# Patient Record
Sex: Male | Born: 1945
Health system: Southern US, Community
[De-identification: ages and names within clinical notes are randomized; demographics above are authoritative.]

## PROBLEM LIST (undated history)

## (undated) ENCOUNTER — Emergency Department (HOSPITAL_COMMUNITY): Payer: Self-pay | Source: Home / Self Care

## (undated) DIAGNOSIS — I739 Peripheral vascular disease, unspecified: Secondary | ICD-10-CM

## (undated) DIAGNOSIS — I714 Abdominal aortic aneurysm, without rupture, unspecified: Secondary | ICD-10-CM

## (undated) DIAGNOSIS — E785 Hyperlipidemia, unspecified: Secondary | ICD-10-CM

## (undated) DIAGNOSIS — J449 Chronic obstructive pulmonary disease, unspecified: Secondary | ICD-10-CM

## (undated) DIAGNOSIS — I2510 Atherosclerotic heart disease of native coronary artery without angina pectoris: Secondary | ICD-10-CM

## (undated) DIAGNOSIS — I10 Essential (primary) hypertension: Secondary | ICD-10-CM

## (undated) HISTORY — DX: Abdominal aortic aneurysm, without rupture, unspecified: I71.40

## (undated) HISTORY — DX: Abdominal aortic aneurysm, without rupture: I71.4

---

## 2004-08-08 ENCOUNTER — Emergency Department (HOSPITAL_COMMUNITY): Admission: EM | Admit: 2004-08-08 | Discharge: 2004-08-08 | Payer: Self-pay | Admitting: Emergency Medicine

## 2013-11-17 ENCOUNTER — Encounter (HOSPITAL_BASED_OUTPATIENT_CLINIC_OR_DEPARTMENT_OTHER): Payer: Self-pay | Admitting: Emergency Medicine

## 2013-11-17 ENCOUNTER — Emergency Department (HOSPITAL_BASED_OUTPATIENT_CLINIC_OR_DEPARTMENT_OTHER)
Admission: EM | Admit: 2013-11-17 | Discharge: 2013-11-17 | Disposition: A | Discharge status: Home / Self Care | Payer: 59 | Attending: Emergency Medicine | Admitting: Emergency Medicine

## 2013-11-17 DIAGNOSIS — R531 Weakness: Secondary | ICD-10-CM

## 2013-11-17 DIAGNOSIS — I10 Essential (primary) hypertension: Secondary | ICD-10-CM | POA: Diagnosis not present

## 2013-11-17 DIAGNOSIS — R5381 Other malaise: Secondary | ICD-10-CM | POA: Insufficient documentation

## 2013-11-17 DIAGNOSIS — R5383 Other fatigue: Secondary | ICD-10-CM | POA: Diagnosis not present

## 2013-11-17 DIAGNOSIS — Z79899 Other long term (current) drug therapy: Secondary | ICD-10-CM | POA: Diagnosis not present

## 2013-11-17 DIAGNOSIS — J4489 Other specified chronic obstructive pulmonary disease: Secondary | ICD-10-CM | POA: Insufficient documentation

## 2013-11-17 DIAGNOSIS — Z87891 Personal history of nicotine dependence: Secondary | ICD-10-CM | POA: Insufficient documentation

## 2013-11-17 DIAGNOSIS — J449 Chronic obstructive pulmonary disease, unspecified: Secondary | ICD-10-CM | POA: Diagnosis not present

## 2013-11-17 DIAGNOSIS — R42 Dizziness and giddiness: Secondary | ICD-10-CM | POA: Insufficient documentation

## 2013-11-17 HISTORY — DX: Chronic obstructive pulmonary disease, unspecified: J44.9

## 2013-11-17 LAB — URINALYSIS, ROUTINE W REFLEX MICROSCOPIC
Bilirubin Urine: NEGATIVE
Glucose, UA: NEGATIVE mg/dL
Hgb urine dipstick: NEGATIVE
KETONES UR: NEGATIVE mg/dL
NITRITE: NEGATIVE
Protein, ur: NEGATIVE mg/dL
SPECIFIC GRAVITY, URINE: 1.008 (ref 1.005–1.030)
UROBILINOGEN UA: 0.2 mg/dL (ref 0.0–1.0)
pH: 7 (ref 5.0–8.0)

## 2013-11-17 LAB — CBC WITH DIFFERENTIAL/PLATELET
BASOS ABS: 0 10*3/uL (ref 0.0–0.1)
Basophils Relative: 0 % (ref 0–1)
EOS ABS: 0 10*3/uL (ref 0.0–0.7)
EOS PCT: 0 % (ref 0–5)
HCT: 41.6 % (ref 39.0–52.0)
Hemoglobin: 14.8 g/dL (ref 13.0–17.0)
Lymphocytes Relative: 13 % (ref 12–46)
Lymphs Abs: 1.1 10*3/uL (ref 0.7–4.0)
MCH: 35.2 pg — AB (ref 26.0–34.0)
MCHC: 35.6 g/dL (ref 30.0–36.0)
MCV: 99 fL (ref 78.0–100.0)
Monocytes Absolute: 0.5 10*3/uL (ref 0.1–1.0)
Monocytes Relative: 6 % (ref 3–12)
Neutro Abs: 7 10*3/uL (ref 1.7–7.7)
Neutrophils Relative %: 81 % — ABNORMAL HIGH (ref 43–77)
PLATELETS: 104 10*3/uL — AB (ref 150–400)
RBC: 4.2 MIL/uL — ABNORMAL LOW (ref 4.22–5.81)
RDW: 13 % (ref 11.5–15.5)
WBC: 8.7 10*3/uL (ref 4.0–10.5)

## 2013-11-17 LAB — BASIC METABOLIC PANEL
ANION GAP: 15 (ref 5–15)
BUN: 13 mg/dL (ref 6–23)
CALCIUM: 9.6 mg/dL (ref 8.4–10.5)
CHLORIDE: 97 meq/L (ref 96–112)
CO2: 26 mEq/L (ref 19–32)
CREATININE: 1 mg/dL (ref 0.50–1.35)
GFR calc non Af Amer: 76 mL/min — ABNORMAL LOW (ref >90)
GFR, EST AFRICAN AMERICAN: 89 mL/min — AB (ref >90)
Glucose, Bld: 120 mg/dL — ABNORMAL HIGH (ref 70–99)
Potassium: 4 mEq/L (ref 3.7–5.3)
Sodium: 138 mEq/L (ref 137–147)

## 2013-11-17 LAB — URINE MICROSCOPIC-ADD ON

## 2013-11-17 LAB — TROPONIN I: Troponin I: <0.3 ng/mL (ref <0.30)

## 2013-11-17 MED ORDER — HYDROCHLOROTHIAZIDE 25 MG PO TABS: 12.5000 mg | ORAL_TABLET | Freq: Every day | ORAL | Status: DC

## 2013-11-17 MED ORDER — SODIUM CHLORIDE 0.9 % IV BOLUS (SEPSIS)
500.0000 mL | Freq: Once | INTRAVENOUS | Status: AC
  Administered 2013-11-17: 500 mL via INTRAVENOUS

## 2013-11-17 NOTE — ED Notes (Signed)
Pt discharged to home NAD.  

## 2013-11-17 NOTE — Discharge Instructions (Signed)
Weakness Weakness is a lack of strength. It may be felt all over the body (generalized) or in one specific part of the body (focal). Some causes of weakness can be serious. You may need further medical evaluation, especially if you are elderly or you have a history of immunosuppression (such as chemotherapy or HIV), kidney disease, heart disease, or diabetes. CAUSES  Weakness can be caused by many different things, including:  Infection.  Physical exhaustion.  Internal bleeding or other blood loss that results in a lack of red blood cells (anemia).  Dehydration. This cause is more common in elderly people.  Side effects or electrolyte abnormalities from medicines, such as pain medicines or sedatives.  Emotional distress, anxiety, or depression.  Circulation problems, especially severe peripheral arterial disease.  Heart disease, such as rapid atrial fibrillation, bradycardia, or heart failure.  Nervous system disorders, such as Guillain-Barr syndrome, multiple sclerosis, or stroke. DIAGNOSIS  To find the cause of your weakness, your caregiver will take your history and perform a physical exam. Lab tests or X-rays may also be ordered, if needed. TREATMENT  Treatment of weakness depends on the cause of your symptoms and can vary greatly. HOME CARE INSTRUCTIONS   Rest as needed.  Eat a well-balanced diet.  Try to get some exercise every day.  Only take over-the-counter or prescription medicines as directed by your caregiver. SEEK MEDICAL CARE IF:   Your weakness seems to be getting worse or spreads to other parts of your body.  You develop new aches or pains. SEEK IMMEDIATE MEDICAL CARE IF:   You cannot perform your normal daily activities, such as getting dressed and feeding yourself.  You cannot walk up and down stairs, or you feel exhausted when you do so.  You have shortness of breath or chest pain.  You have difficulty moving parts of your body.  You have weakness  in only one area of the body or on only one side of the body.  You have a fever.  You have trouble speaking or swallowing.  You cannot control your bladder or bowel movements.  You have black or bloody vomit or stools. MAKE SURE YOU:  Understand these instructions.  Will watch your condition.  Will get help right away if you are not doing well or get worse. Document Released: 04/05/2005 Document Revised: 10/05/2011 Document Reviewed: 06/04/2011 [facility] Patient Information 2015 [employer] [place]. This information is not intended to replace advice given to you by your health care provider. Make sure you discuss any questions you have with your health care provider. Hypertension Hypertension, commonly called high blood pressure, is when the force of blood pumping through your arteries is too strong. Your arteries are the blood vessels that carry blood from your heart throughout your body. A blood pressure reading consists of a higher number over a lower number, such as 110/72. The higher number (systolic) is the pressure inside your arteries when your heart pumps. The lower number (diastolic) is the pressure inside your arteries when your heart relaxes. Ideally you want your blood pressure below 120/80. Hypertension forces your heart to work harder to pump blood. Your arteries may become narrow or stiff. Having hypertension puts you at risk for heart disease, stroke, and other problems.  RISK FACTORS Some risk factors for high blood pressure are controllable. Others are not.  Risk factors you cannot control include:   Race. You may be at higher risk if you are African American.  Age. Risk increases with age.  Gender.  Men are at higher risk than women before age 75 years. After age 70, women are at higher risk than men. Risk factors you can control include:  Not getting enough exercise or physical activity.  Being overweight.  Getting too much fat, sugar, calories, or salt in your  diet.  Drinking too much alcohol. SIGNS AND SYMPTOMS Hypertension does not usually cause signs or symptoms. Extremely high blood pressure (hypertensive crisis) may cause headache, anxiety, shortness of breath, and nosebleed. DIAGNOSIS  To check if you have hypertension, your health care provider will measure your blood pressure while you are seated, with your arm held at the level of your heart. It should be measured at least twice using the same arm. Certain conditions can cause a difference in blood pressure between your right and left arms. A blood pressure reading that is higher than normal on one occasion does not mean that you need treatment. If one blood pressure reading is high, ask your health care provider about having it checked again. TREATMENT  Treating high blood pressure includes making lifestyle changes and possibly taking medicine. Living a healthy lifestyle can help lower high blood pressure. You may need to change some of your habits. Lifestyle changes may include:  Following the DASH diet. This diet is high in fruits, vegetables, and whole grains. It is low in salt, red meat, and added sugars.  Getting at least 2 hours of brisk physical activity every week.  Losing weight if necessary.  Not smoking.  Limiting alcoholic beverages.  Learning ways to reduce stress. If lifestyle changes are not enough to get your blood pressure under control, your health care provider may prescribe medicine. You may need to take more than one. Work closely with your health care provider to understand the risks and benefits. HOME CARE INSTRUCTIONS  Have your blood pressure rechecked as directed by your health care provider.   Take medicines only as directed by your health care provider. Follow the directions carefully. Blood pressure medicines must be taken as prescribed. The medicine does not work as well when you skip doses. Skipping doses also puts you at risk for problems.   Do not  smoke.   Monitor your blood pressure at home as directed by your health care provider. SEEK MEDICAL CARE IF:   You think you are having a reaction to medicines taken.  You have recurrent headaches or feel dizzy.  You have swelling in your ankles.  You have trouble with your vision. SEEK IMMEDIATE MEDICAL CARE IF:  You develop a severe headache or confusion.  You have unusual weakness, numbness, or feel faint.  You have severe chest or abdominal pain.  You vomit repeatedly.  You have trouble breathing. MAKE SURE YOU:   Understand these instructions.  Will watch your condition.  Will get help right away if you are not doing well or get worse. Document Released: 04/05/2005 Document Revised: 08/20/2013 Document Reviewed: 01/26/2013 [facility] Patient Information 2015 [place], [place]. This information is not intended to replace advice given to you by your health care provider. Make sure you discuss any questions you have with your health care provider.

## 2013-11-17 NOTE — ED Provider Notes (Signed)
CSN: [id number]     Arrival date & time 11/17/13  1041 History   First MD Initiated Contact with Patient 11/17/13 1204     Chief Complaint  Patient presents with  . Weakness     (Consider location/radiation/quality/duration/timing/severity/associated sxs/prior Treatment) Patient is a 68 y.o. male presenting with weakness. The history is provided by the patient. No language interpreter was used.  Weakness This is a new problem. Associated symptoms include weakness. Pertinent negatives include no abdominal pain, chest pain, chills, congestion, coughing, fever, headaches, myalgias, nausea, rash or sore throat. Associated symptoms comments: He reports lightheadedness/dizziness and generalized weakness over the last week that is a bit worse today. No falls. No fever, N, V, D, or pain. He denies headaches, visual changes, lateralizing weakness, syncope or near syncope. No urinary symptoms, change in bowel habits, change in appetite..    Past Medical History  Diagnosis Date  . COPD (chronic obstructive pulmonary disease)    History reviewed. No pertinent past surgical history. No family history on file. History  Substance Use Topics  . Smoking status: Former Games developer  . Smokeless tobacco: Not on file  . Alcohol Use: Yes    Review of Systems  Constitutional: Positive for activity change. Negative for fever, chills and appetite change.  HENT: Negative.  Negative for congestion and sore throat.   Eyes: Negative.  Negative for visual disturbance.  Respiratory: Negative.  Negative for cough and shortness of breath.   Cardiovascular: Negative.  Negative for chest pain.  Gastrointestinal: Negative.  Negative for nausea, abdominal pain, diarrhea and constipation.  Genitourinary: Negative.  Negative for dysuria, hematuria and difficulty urinating.  Musculoskeletal: Negative.  Negative for myalgias.  Skin: Negative.  Negative for pallor and rash.  Neurological: Positive for dizziness, weakness and  light-headedness. Negative for syncope, speech difficulty and headaches.  Psychiatric/Behavioral: Negative.  Negative for confusion.      Allergies  Review of patient's allergies indicates no known allergies.  Home Medications   Prior to Admission medications   Medication Sig Start Date End Date Taking? Authorizing Provider  budesonide-formoterol (SYMBICORT) 160-4.5 MCG/ACT inhaler Inhale 2 puffs into the lungs 2 (two) times daily.   Yes Historical Provider, MD   BP 188/88  Pulse 81  Temp(Src) 97.9 F (36.6 C) (Oral)  Resp 28  Ht 6\' 1"  (1.854 m)  Wt 228 lb (103.42 kg)  BMI 30.09 kg/m2  SpO2 97% Physical Exam  Constitutional: He is oriented to person, place, and time. He appears well-developed and well-nourished. No distress.  HENT:  Head: Normocephalic.  Neck: Normal range of motion. Neck supple.  Cardiovascular: Normal rate and regular rhythm.   Pulmonary/Chest: Effort normal and breath sounds normal. He has no wheezes. He has no rales.  Abdominal: Soft. Bowel sounds are normal. There is no tenderness. There is no rebound and no guarding.  Musculoskeletal: Normal range of motion.  Neurological: He is alert and oriented to person, place, and time. Coordination normal.  CN's 3-12 grossly intact. Ambulatory with steady gait, no ataxia.   Skin: Skin is warm and dry. No rash noted.  Psychiatric: He has a normal mood and affect.    ED Course  Procedures (including critical care time) Labs Review Labs Reviewed  BASIC METABOLIC PANEL  URINALYSIS, ROUTINE W REFLEX MICROSCOPIC  TROPONIN I  CBC WITH DIFFERENTIAL   Results for orders placed during the hospital encounter of 11/17/13  BASIC METABOLIC PANEL      Result Value Ref Range   Sodium 138  137 -  147 mEq/L   Potassium 4.0  3.7 - 5.3 mEq/L   Chloride 97  96 - 112 mEq/L   CO2 26  19 - 32 mEq/L   Glucose, Bld 120 (*) 70 - 99 mg/dL   BUN 13  6 - 23 mg/dL   Creatinine, Ser 1.611.00  0.50 - 1.35 mg/dL   Calcium 9.6  8.4 -  09.610.5 mg/dL   GFR calc non Af Amer 76 (*) >90 mL/min   GFR calc Af Amer 89 (*) >90 mL/min   Anion gap 15  5 - 15  URINALYSIS, ROUTINE W REFLEX MICROSCOPIC      Result Value Ref Range   Color, Urine YELLOW  YELLOW   APPearance CLEAR  CLEAR   Specific Gravity, Urine 1.008  1.005 - 1.030   pH 7.0  5.0 - 8.0   Glucose, UA NEGATIVE  NEGATIVE mg/dL   Hgb urine dipstick NEGATIVE  NEGATIVE   Bilirubin Urine NEGATIVE  NEGATIVE   Ketones, ur NEGATIVE  NEGATIVE mg/dL   Protein, ur NEGATIVE  NEGATIVE mg/dL   Urobilinogen, UA 0.2  0.0 - 1.0 mg/dL   Nitrite NEGATIVE  NEGATIVE   Leukocytes, UA TRACE (*) NEGATIVE  TROPONIN I      Result Value Ref Range   Troponin I <0.30  <0.30 ng/mL  CBC WITH DIFFERENTIAL      Result Value Ref Range   WBC 8.7  4.0 - 10.5 K/uL   RBC 4.20 (*) 4.22 - 5.81 MIL/uL   Hemoglobin 14.8  13.0 - 17.0 g/dL   HCT 04.541.6  40.939.0 - 81.152.0 %   MCV 99.0  78.0 - 100.0 fL   MCH 35.2 (*) 26.0 - 34.0 pg   MCHC 35.6  30.0 - 36.0 g/dL   RDW 91.413.0  78.211.5 - 95.615.5 %   Platelets 104 (*) 150 - 400 K/uL   Neutrophils Relative % 81 (*) 43 - 77 %   Neutro Abs 7.0  1.7 - 7.7 K/uL   Lymphocytes Relative 13  12 - 46 %   Lymphs Abs 1.1  0.7 - 4.0 K/uL   Monocytes Relative 6  3 - 12 %   Monocytes Absolute 0.5  0.1 - 1.0 K/uL   Eosinophils Relative 0  0 - 5 %   Eosinophils Absolute 0.0  0.0 - 0.7 K/uL   Basophils Relative 0  0 - 1 %   Basophils Absolute 0.0  0.0 - 0.1 K/uL  URINE MICROSCOPIC-ADD ON      Result Value Ref Range   Squamous Epithelial / LPF RARE  RARE   WBC, UA 0-2  <3 WBC/hpf   Bacteria, UA FEW (*) RARE   No results found.  Imaging Review No results found.   EKG Interpretation None      MDM   Final diagnoses:  None    1. Generalized weakness 2. Hypertension  The patient is ambulatory, has a normal neurologic exam and continues to report improvement over time. He is stable for discharge home.    Arnoldo HookerShari A Pierce Barocio, PA-C 11/17/13 1546

## 2013-11-17 NOTE — ED Notes (Signed)
Patient reports that he developed weakness and fatigue. Denies fever, denies chills. Reports that it all started earlier in week, with bilateral lef aching. Alert and oriented, [facility]

## 2013-11-17 NOTE — ED Provider Notes (Signed)
Medical screening examination/treatment/procedure(s) were conducted as a shared visit with non-physician practitioner(s) and myself.  I personally evaluated the patient during the encounter.   EKG Interpretation   Date/Time:  Saturday November 17 2013 12:42:23 EDT Ventricular Rate:  69 PR Interval:  222 QRS Duration: 68 QT Interval:  404 QTC Calculation: 432 R Axis:   2 Text Interpretation:  Sinus rhythm with 1st degree A-V block Low voltage  QRS Septal infarct , age undetermined Abnormal ECG No old tracing to  compare Confirmed by Drue Harr  MD, Nichola Cieslinski ([id number]) on 11/17/2013 4:08:58 PM      Pt with generalized weakness.  No focal neuro deficits.  No SOB, chest tightness..  Labs unremarkable.  No ischemic changes on EKG.  Will f/u with PMD.  Rolan BuccoMelanie Ibeth Fahmy, MD 11/17/13 [phone]

## 2013-11-23 ENCOUNTER — Emergency Department (HOSPITAL_BASED_OUTPATIENT_CLINIC_OR_DEPARTMENT_OTHER)
Admission: EM | Admit: 2013-11-23 | Discharge: 2013-11-23 | Disposition: A | Discharge status: Home / Self Care | Payer: 59 | Attending: Emergency Medicine | Admitting: Emergency Medicine

## 2013-11-23 ENCOUNTER — Encounter (HOSPITAL_BASED_OUTPATIENT_CLINIC_OR_DEPARTMENT_OTHER): Payer: Self-pay | Admitting: Emergency Medicine

## 2013-11-23 DIAGNOSIS — R5383 Other fatigue: Secondary | ICD-10-CM | POA: Diagnosis present

## 2013-11-23 DIAGNOSIS — Z79899 Other long term (current) drug therapy: Secondary | ICD-10-CM | POA: Diagnosis not present

## 2013-11-23 DIAGNOSIS — R5381 Other malaise: Secondary | ICD-10-CM | POA: Insufficient documentation

## 2013-11-23 DIAGNOSIS — J449 Chronic obstructive pulmonary disease, unspecified: Secondary | ICD-10-CM | POA: Diagnosis not present

## 2013-11-23 DIAGNOSIS — Z880 Allergy status to penicillin: Secondary | ICD-10-CM | POA: Diagnosis not present

## 2013-11-23 DIAGNOSIS — Z87891 Personal history of nicotine dependence: Secondary | ICD-10-CM | POA: Insufficient documentation

## 2013-11-23 DIAGNOSIS — I10 Essential (primary) hypertension: Secondary | ICD-10-CM

## 2013-11-23 DIAGNOSIS — R002 Palpitations: Secondary | ICD-10-CM | POA: Insufficient documentation

## 2013-11-23 DIAGNOSIS — J4489 Other specified chronic obstructive pulmonary disease: Secondary | ICD-10-CM | POA: Insufficient documentation

## 2013-11-23 LAB — CBC
HCT: 43.7 % (ref 39.0–52.0)
Hemoglobin: 15.7 g/dL (ref 13.0–17.0)
MCH: 34.8 pg — AB (ref 26.0–34.0)
MCHC: 35.9 g/dL (ref 30.0–36.0)
MCV: 96.9 fL (ref 78.0–100.0)
PLATELETS: 130 10*3/uL — AB (ref 150–400)
RBC: 4.51 MIL/uL (ref 4.22–5.81)
RDW: 12.5 % (ref 11.5–15.5)
WBC: 10.9 10*3/uL — ABNORMAL HIGH (ref 4.0–10.5)

## 2013-11-23 LAB — BASIC METABOLIC PANEL
ANION GAP: 12 (ref 5–15)
BUN: 10 mg/dL (ref 6–23)
CO2: 30 meq/L (ref 19–32)
CREATININE: 1 mg/dL (ref 0.50–1.35)
Calcium: 10.2 mg/dL (ref 8.4–10.5)
Chloride: 91 mEq/L — ABNORMAL LOW (ref 96–112)
GFR calc Af Amer: 89 mL/min — ABNORMAL LOW (ref >90)
GFR calc non Af Amer: 76 mL/min — ABNORMAL LOW (ref >90)
Glucose, Bld: 120 mg/dL — ABNORMAL HIGH (ref 70–99)
Potassium: 4.1 mEq/L (ref 3.7–5.3)
Sodium: 133 mEq/L — ABNORMAL LOW (ref 137–147)

## 2013-11-23 MED ORDER — LISINOPRIL 10 MG PO TABS
20.0000 mg | ORAL_TABLET | Freq: Once | ORAL | Status: AC
  Administered 2013-11-23: 20 mg via ORAL
  Filled 2013-11-23: qty 2

## 2013-11-23 MED ORDER — LISINOPRIL-HYDROCHLOROTHIAZIDE 20-12.5 MG PO TABS: 1.0000 | ORAL_TABLET | Freq: Every day | ORAL | Status: DC

## 2013-11-23 NOTE — ED Provider Notes (Signed)
CSN: [id number]     Arrival date & time 11/23/13  1432 History   First MD Initiated Contact with Patient 11/23/13 1503     Chief Complaint  Patient presents with  . Hypertension     (Consider location/radiation/quality/duration/timing/severity/associated sxs/prior Treatment) HPI 68 year old man presents with continued hypertension. He was seen here on 8/1 with a vague overall weakness and diagnosed with hypertension and started on 15 mg HCTZ. He's been taking this as instructed, last took it today. Since then his blood pressures been mostly 160 systolic. Today his been feeling intermittent lightheadedness and overall weakness and noticed blood pressure was 215 systolic. He denies headache, blurry vision, chest pain, shortness of breath, or focal weakness. He just feels overall tired. He has been feeling some intermittent palpitations. These are without pain. Patient has a primary care physician who recently moved, is unsure of this practice will continue to follow him or not.  Past Medical History  Diagnosis Date  . COPD (chronic obstructive pulmonary disease)    History reviewed. No pertinent past surgical history. No family history on file. History  Substance Use Topics  . Smoking status: Former Games developer  . Smokeless tobacco: Not on file  . Alcohol Use: Yes    Review of Systems  Constitutional: Negative for fever.  Eyes: Negative for visual disturbance.  Respiratory: Negative for shortness of breath.   Cardiovascular: Positive for palpitations. Negative for chest pain.  Gastrointestinal: Negative for abdominal pain.  Neurological: Positive for weakness and light-headedness. Negative for headaches.  All other systems reviewed and are negative.     Allergies  Penicillins  Home Medications   Prior to Admission medications   Medication Sig Start Date End Date Taking? Authorizing Provider  budesonide-formoterol (SYMBICORT) 160-4.5 MCG/ACT inhaler Inhale 2 puffs into the lungs 2  (two) times daily.    Historical Provider, MD  hydrochlorothiazide (HYDRODIURIL) 25 MG tablet Take 0.5 tablets (12.5 mg total) by mouth daily. 11/17/13   Shari A Upstill, PA-C   BP 197/94  Pulse 91  Temp(Src) 97.9 F (36.6 C) (Oral)  Resp 20  Ht 6' (1.829 m)  Wt 228 lb (103.42 kg)  BMI 30.92 kg/m2  SpO2 100% Physical Exam  Nursing note and vitals reviewed. Constitutional: He is oriented to person, place, and time. He appears well-developed and well-nourished.  HENT:  Head: Normocephalic and atraumatic.  Right Ear: External ear normal.  Left Ear: External ear normal.  Nose: Nose normal.  Eyes: EOM are normal. Pupils are equal, round, and reactive to light. Right eye exhibits no discharge. Left eye exhibits no discharge.  Neck: Neck supple.  Cardiovascular: Normal rate, regular rhythm, normal heart sounds and intact distal pulses.   No murmur heard. Pulmonary/Chest: Effort normal and breath sounds normal.  Abdominal: Soft. There is no tenderness.  Musculoskeletal: He exhibits no edema.  Neurological: He is alert and oriented to person, place, and time.  CN 2-12 grossly intact. 5/5 strength in all 4 extremities. Normal gait  Skin: Skin is warm and dry.    ED Course  Procedures (including critical care time) Labs Review Labs Reviewed  CBC - Abnormal; Notable for the following:    WBC 10.9 (*)    MCH 34.8 (*)    Platelets 130 (*)    All other components within normal limits  BASIC METABOLIC PANEL - Abnormal; Notable for the following:    Sodium 133 (*)    Chloride 91 (*)    Glucose, Bld 120 (*)    GFR  calc non Af Amer 76 (*)    GFR calc Af Amer 89 (*)    All other components within normal limits    Imaging Review No results found.   EKG Interpretation   Date/Time:  Friday November 23 2013 15:35:00 EDT Ventricular Rate:  77 PR Interval:  218 QRS Duration: 92 QT Interval:  410 QTC Calculation: 463 R Axis:   -2 Text Interpretation:  Sinus rhythm with 1st degree A-V  block Low voltage  QRS Borderline ECG no significant change since 11/17/13 Confirmed by  Mujtaba Bollig  MD, Trica Usery ([id number]) on 11/23/2013 3:38:09 PM      MDM   Final diagnoses:  Essential hypertension    Exam is unremarkable, as well as lab workup and EKG. Minimal WBC elevation of uncertain etiology, has no infectious symptoms or pain. Has vague weakness but no localizing weakness or neuro abnormalities. Due to this, feel he does not need IV BP control and will add Lisinopril to his regimen. His BP trended down without meds in ED, but still hypertensive at nearly 160 systolic. Recommend he find a PCP as soon as possible.     Audree CamelScott T Madiline Saffran, MD 11/23/13 [phone]

## 2013-11-23 NOTE — Discharge Instructions (Signed)

## 2013-11-23 NOTE — ED Notes (Signed)
Pt c/o cont'd elevated BP -seen here last week for same-unable to f/u with PCP

## 2017-03-10 ENCOUNTER — Emergency Department (HOSPITAL_COMMUNITY)
Admission: EM | Admit: 2017-03-10 | Discharge: 2017-03-11 | Disposition: A | Discharge status: Home / Self Care | Payer: Self-pay | Attending: Emergency Medicine | Admitting: Emergency Medicine

## 2017-03-10 ENCOUNTER — Encounter (HOSPITAL_COMMUNITY): Payer: Self-pay | Admitting: *Deleted

## 2017-03-10 DIAGNOSIS — J449 Chronic obstructive pulmonary disease, unspecified: Secondary | ICD-10-CM | POA: Insufficient documentation

## 2017-03-10 DIAGNOSIS — R2241 Localized swelling, mass and lump, right lower limb: Secondary | ICD-10-CM | POA: Insufficient documentation

## 2017-03-10 DIAGNOSIS — L03115 Cellulitis of right lower limb: Secondary | ICD-10-CM | POA: Insufficient documentation

## 2017-03-10 DIAGNOSIS — Z87891 Personal history of nicotine dependence: Secondary | ICD-10-CM | POA: Insufficient documentation

## 2017-03-10 MED ORDER — CLINDAMYCIN HCL 150 MG PO CAPS: 450.0000 mg | ORAL_CAPSULE | Freq: Four times a day (QID) | ORAL | 0 refills | Status: DC

## 2017-03-10 MED ORDER — CLINDAMYCIN HCL 150 MG PO CAPS: 450.0000 mg | ORAL_CAPSULE | Freq: Four times a day (QID) | ORAL | 0 refills | Status: AC

## 2017-03-10 NOTE — ED Provider Notes (Signed)
[facility] Provider Note   CSN: [id number] Arrival date & time: 03/10/17  1802     History   Chief Complaint Chief Complaint  Patient presents with  . Leg Pain    HPI Jorge Clements is a 71 y.o. male.  HPI 71 year old male with a history of COPD peripheral artery disease and chronic lymphedema presenting with worsening erythema, drainage and swelling to the right leg.  [place] to difficulty ambulating due to his PAD and lymphedema and came back here to live with his ex-wife.  She noticed some yellow drainage from an abrasion to his right anterior shin and was worried about infection.  Has not had any fevers.  Denies headache, chest pain, shortness of breath, abdominal pain, nausea, vomiting, diarrhea.  Is supposed be taking Lasix for his leg swelling but has not been taking it recently.  No alleviating factors.  No history of DVTs.  Endorses mild aching pain to his right shin.  Past Medical History:  Diagnosis Date  . COPD (chronic obstructive pulmonary disease) (HCC)     There are no active problems to display for this patient.   History reviewed. No pertinent surgical history.     Home Medications    Prior to Admission medications   Medication Sig Start Date End Date Taking? Authorizing Provider  clindamycin (CLEOCIN) 150 MG capsule Take 3 capsules (450 mg total) by mouth every 6 (six) hours for 7 days. 03/10/17 03/17/17  Neysa Arts Italyhad, MD  lisinopril-hydrochlorothiazide (PRINZIDE) 20-12.5 MG per tablet Take 1 tablet by mouth daily. Patient not taking: Reported on 03/10/2017 11/23/13   Pricilla LovelessGoldston, Scott, MD    Family History No family history on file.  Social History Social History   Tobacco Use  . Smoking status: Former Smoker  Substance Use Topics  . Alcohol use: Yes  . Drug use: No     Allergies   Penicillins   Review of Systems Review of Systems  Constitutional: Negative for  chills and fever.  HENT: Negative for ear pain and sore throat.   Eyes: Negative for pain and visual disturbance.  Respiratory: Negative for cough and shortness of breath.   Cardiovascular: Positive for leg swelling. Negative for chest pain and palpitations.  Gastrointestinal: Negative for abdominal pain and vomiting.  Genitourinary: Negative for dysuria and hematuria.  Musculoskeletal: Negative for arthralgias and back pain.  Skin: Positive for wound. Negative for color change and rash.  Neurological: Negative for seizures and syncope.  All other systems reviewed and are negative.    Physical Exam Updated Vital Signs BP (!) 117/51   Pulse 71   Temp 97.8 F (36.6 C) (Oral)   Resp 18   SpO2 100%   Physical Exam  Constitutional: He appears well-developed and well-nourished.  HENT:  Head: Normocephalic and atraumatic.  Eyes: Conjunctivae are normal.  Neck: Neck supple.  Cardiovascular: Normal rate and regular rhythm.  No murmur heard. Pulmonary/Chest: Effort normal and breath sounds normal. No respiratory distress.  Abdominal: Soft. There is no tenderness.  Musculoskeletal: He exhibits edema (3+ pitting edema to RLE and 1+ pitting edema to LLE.).  Neurological: He is alert.  Skin: Skin is warm and dry.  Significant erythema and cobblestoning to the skin of the right lower leg with a 6 cm abrasion to the anterior shin.  Faint DP pulse palpated bilaterally.  No significant calf tenderness bilaterally  Psychiatric: He has a normal mood and affect.  Nursing note and vitals reviewed.    ED Treatments / Results  Labs (all labs ordered are listed, but only abnormal results are displayed) Labs Reviewed - No data to display  EKG  EKG Interpretation None       Radiology No results found.  Procedures Procedures (including critical care time)  Medications Ordered in ED Medications - No data to display   Initial Impression / Assessment and Plan / ED Course  I have  reviewed the triage vital signs and the nursing notes.  Pertinent labs & imaging results that were available during my care of the patient were reviewed by me and considered in my medical decision making (see chart for details).     71 year old male with the above history presenting with swelling, pain, erythema and drainage from an abrasion on his right shin.  Afebrile and hemodynamically stable.  He states that he has had lymphedema for many years and was recently pulled out of a nursing facility to live with his ex-wife.  Exam concerning for cellulitis.  Cannot rule out DVT however the ultrasound staff is not available at this time.  No recent falls or trauma.  Most likely cellulitis.  No signs of sepsis or systemic infection at this time.  Will treat with clindamycin and have the patient return tomorrow for a DVT ultrasound.  Patient amenable with plan.  Return precautions given.   Final Clinical Impressions(s) / ED Diagnoses   Final diagnoses:  Cellulitis of right lower extremity    ED Discharge Orders        Ordered    clindamycin (CLEOCIN) 150 MG capsule  Every 6 hours,   Status:  Discontinued     03/10/17 2059    LE VENOUS     03/10/17 2059    clindamycin (CLEOCIN) 150 MG capsule  Every 6 hours     03/10/17 2100       Clara Smolen Italyhad, MD 03/10/17 2232    Melene PlanFloyd, Dan, DO 03/10/17 2312

## 2017-03-10 NOTE — ED Triage Notes (Signed)
To ED for eval of right leg swelling, redness, and pain. Pt states 3 yrs ago he was dx with 50% blood flow to legs. States over the past 2 yrs he's had stents placed in legs. States he was just released from a rehab [place] 2 [place] ago and [place] [facility] [place]. States he was cramped in the car and has had difficulty walking since [place] home ([place] Right leg bright red with skin tears, swelling, and drainage.

## 2017-03-10 NOTE — Discharge Instructions (Signed)
Please return tomorrow for your DVT US.

## 2017-03-11 ENCOUNTER — Inpatient Hospital Stay (HOSPITAL_COMMUNITY): Admission: RE | Admit: 2017-03-11 | Payer: Self-pay | Source: Ambulatory Visit

## 2017-08-11 ENCOUNTER — Encounter (HOSPITAL_COMMUNITY): Payer: Self-pay | Admitting: Emergency Medicine

## 2017-08-11 ENCOUNTER — Inpatient Hospital Stay (HOSPITAL_COMMUNITY)
Admission: EM | Admit: 2017-08-11 | Discharge: 2017-08-22 | DRG: 240 | Disposition: A | Discharge status: Skilled Nursing Facility | Payer: Medicare Other | Attending: Internal Medicine | Admitting: Internal Medicine

## 2017-08-11 ENCOUNTER — Emergency Department (HOSPITAL_COMMUNITY): Payer: Medicare Other

## 2017-08-11 ENCOUNTER — Other Ambulatory Visit: Payer: Self-pay

## 2017-08-11 ENCOUNTER — Observation Stay (HOSPITAL_COMMUNITY): Payer: Medicare Other

## 2017-08-11 DIAGNOSIS — R531 Weakness: Secondary | ICD-10-CM

## 2017-08-11 DIAGNOSIS — I96 Gangrene, not elsewhere classified: Secondary | ICD-10-CM | POA: Diagnosis present

## 2017-08-11 DIAGNOSIS — Z888 Allergy status to other drugs, medicaments and biological substances status: Secondary | ICD-10-CM

## 2017-08-11 DIAGNOSIS — S90821A Blister (nonthermal), right foot, initial encounter: Secondary | ICD-10-CM | POA: Diagnosis present

## 2017-08-11 DIAGNOSIS — Z95820 Peripheral vascular angioplasty status with implants and grafts: Secondary | ICD-10-CM

## 2017-08-11 DIAGNOSIS — L089 Local infection of the skin and subcutaneous tissue, unspecified: Secondary | ICD-10-CM | POA: Diagnosis present

## 2017-08-11 DIAGNOSIS — J449 Chronic obstructive pulmonary disease, unspecified: Secondary | ICD-10-CM | POA: Diagnosis present

## 2017-08-11 DIAGNOSIS — D696 Thrombocytopenia, unspecified: Secondary | ICD-10-CM | POA: Diagnosis present

## 2017-08-11 DIAGNOSIS — Y838 Other surgical procedures as the cause of abnormal reaction of the patient, or of later complication, without mention of misadventure at the time of the procedure: Secondary | ICD-10-CM | POA: Diagnosis present

## 2017-08-11 DIAGNOSIS — T82856A Stenosis of peripheral vascular stent, initial encounter: Principal | ICD-10-CM | POA: Diagnosis present

## 2017-08-11 DIAGNOSIS — L8990 Pressure ulcer of unspecified site, unspecified stage: Secondary | ICD-10-CM

## 2017-08-11 DIAGNOSIS — W19XXXA Unspecified fall, initial encounter: Secondary | ICD-10-CM | POA: Diagnosis not present

## 2017-08-11 DIAGNOSIS — F17290 Nicotine dependence, other tobacco product, uncomplicated: Secondary | ICD-10-CM | POA: Diagnosis present

## 2017-08-11 DIAGNOSIS — Z89431 Acquired absence of right foot: Secondary | ICD-10-CM

## 2017-08-11 DIAGNOSIS — W010XXA Fall on same level from slipping, tripping and stumbling without subsequent striking against object, initial encounter: Secondary | ICD-10-CM | POA: Diagnosis present

## 2017-08-11 DIAGNOSIS — L03115 Cellulitis of right lower limb: Secondary | ICD-10-CM | POA: Diagnosis not present

## 2017-08-11 DIAGNOSIS — E871 Hypo-osmolality and hyponatremia: Secondary | ICD-10-CM | POA: Diagnosis present

## 2017-08-11 DIAGNOSIS — M86271 Subacute osteomyelitis, right ankle and foot: Secondary | ICD-10-CM | POA: Diagnosis present

## 2017-08-11 DIAGNOSIS — Z66 Do not resuscitate: Secondary | ICD-10-CM | POA: Diagnosis present

## 2017-08-11 DIAGNOSIS — R945 Abnormal results of liver function studies: Secondary | ICD-10-CM | POA: Diagnosis not present

## 2017-08-11 DIAGNOSIS — M7989 Other specified soft tissue disorders: Secondary | ICD-10-CM

## 2017-08-11 DIAGNOSIS — E538 Deficiency of other specified B group vitamins: Secondary | ICD-10-CM | POA: Diagnosis present

## 2017-08-11 DIAGNOSIS — Z79899 Other long term (current) drug therapy: Secondary | ICD-10-CM

## 2017-08-11 DIAGNOSIS — E860 Dehydration: Secondary | ICD-10-CM | POA: Diagnosis present

## 2017-08-11 DIAGNOSIS — I10 Essential (primary) hypertension: Secondary | ICD-10-CM | POA: Diagnosis present

## 2017-08-11 DIAGNOSIS — E876 Hypokalemia: Secondary | ICD-10-CM | POA: Diagnosis not present

## 2017-08-11 DIAGNOSIS — E785 Hyperlipidemia, unspecified: Secondary | ICD-10-CM | POA: Diagnosis present

## 2017-08-11 DIAGNOSIS — M79661 Pain in right lower leg: Secondary | ICD-10-CM

## 2017-08-11 DIAGNOSIS — S90829A Blister (nonthermal), unspecified foot, initial encounter: Secondary | ICD-10-CM

## 2017-08-11 DIAGNOSIS — I998 Other disorder of circulatory system: Secondary | ICD-10-CM | POA: Diagnosis present

## 2017-08-11 DIAGNOSIS — Y92009 Unspecified place in unspecified non-institutional (private) residence as the place of occurrence of the external cause: Secondary | ICD-10-CM | POA: Diagnosis not present

## 2017-08-11 DIAGNOSIS — Z7982 Long term (current) use of aspirin: Secondary | ICD-10-CM

## 2017-08-11 DIAGNOSIS — S41111A Laceration without foreign body of right upper arm, initial encounter: Secondary | ICD-10-CM | POA: Diagnosis present

## 2017-08-11 DIAGNOSIS — D649 Anemia, unspecified: Secondary | ICD-10-CM | POA: Diagnosis not present

## 2017-08-11 LAB — URINALYSIS, ROUTINE W REFLEX MICROSCOPIC
BACTERIA UA: NONE SEEN
Bilirubin Urine: NEGATIVE
Glucose, UA: NEGATIVE mg/dL
KETONES UR: 5 mg/dL — AB
Leukocytes, UA: NEGATIVE
NITRITE: NEGATIVE
Protein, ur: 30 mg/dL — AB
Specific Gravity, Urine: 1.017 (ref 1.005–1.030)
pH: 6 (ref 5.0–8.0)

## 2017-08-11 LAB — I-STAT CG4 LACTIC ACID, ED: LACTIC ACID, VENOUS: 1.46 mmol/L (ref 0.5–1.9)

## 2017-08-11 LAB — BASIC METABOLIC PANEL
Anion gap: 11 (ref 5–15)
BUN: 14 mg/dL (ref 6–20)
CO2: 22 mmol/L (ref 22–32)
Calcium: 8.5 mg/dL — ABNORMAL LOW (ref 8.9–10.3)
Chloride: 99 mmol/L — ABNORMAL LOW (ref 101–111)
Creatinine, Ser: 1.03 mg/dL (ref 0.61–1.24)
GFR calc non Af Amer: >60 mL/min (ref >60)
Glucose, Bld: 130 mg/dL — ABNORMAL HIGH (ref 65–99)
POTASSIUM: 3.4 mmol/L — AB (ref 3.5–5.1)
Sodium: 132 mmol/L — ABNORMAL LOW (ref 135–145)

## 2017-08-11 LAB — MRSA PCR SCREENING: MRSA by PCR: NEGATIVE

## 2017-08-11 LAB — CBC
HEMATOCRIT: 37.6 % — AB (ref 39.0–52.0)
Hemoglobin: 12.8 g/dL — ABNORMAL LOW (ref 13.0–17.0)
MCH: 29.9 pg (ref 26.0–34.0)
MCHC: 34 g/dL (ref 30.0–36.0)
MCV: 87.9 fL (ref 78.0–100.0)
Platelets: 76 10*3/uL — ABNORMAL LOW (ref 150–400)
RBC: 4.28 MIL/uL (ref 4.22–5.81)
RDW: 14.3 % (ref 11.5–15.5)
WBC: 10.6 10*3/uL — ABNORMAL HIGH (ref 4.0–10.5)

## 2017-08-11 LAB — TROPONIN I
TROPONIN I: 0.03 ng/mL — AB (ref <0.03)
TROPONIN I: 0.03 ng/mL — AB (ref <0.03)
Troponin I: 0.03 ng/mL (ref <0.03)
Troponin I: <0.03 ng/mL (ref <0.03)

## 2017-08-11 LAB — VITAMIN B12: Vitamin B-12: 148 pg/mL — ABNORMAL LOW (ref 180–914)

## 2017-08-11 LAB — TSH: TSH: 1.667 u[IU]/mL (ref 0.350–4.500)

## 2017-08-11 MED ORDER — ACETAMINOPHEN 650 MG RE SUPP: 650.0000 mg | Freq: Four times a day (QID) | RECTAL | Status: DC | PRN

## 2017-08-11 MED ORDER — ACETAMINOPHEN 325 MG PO TABS
650.0000 mg | ORAL_TABLET | Freq: Four times a day (QID) | ORAL | Status: DC | PRN
  Administered 2017-08-11 – 2017-08-12 (×2): 650 mg via ORAL
  Filled 2017-08-11 (×2): qty 2

## 2017-08-11 MED ORDER — SODIUM CHLORIDE 0.9 % IV SOLN
INTRAVENOUS | Status: AC
  Administered 2017-08-11 – 2017-08-12 (×2): via INTRAVENOUS

## 2017-08-11 MED ORDER — CLINDAMYCIN PHOSPHATE 600 MG/50ML IV SOLN
600.0000 mg | Freq: Three times a day (TID) | INTRAVENOUS | Status: DC
  Administered 2017-08-11 – 2017-08-19 (×23): 600 mg via INTRAVENOUS
  Filled 2017-08-11 (×25): qty 50

## 2017-08-11 MED ORDER — ALPRAZOLAM 0.25 MG PO TABS
0.2500 mg | ORAL_TABLET | Freq: Two times a day (BID) | ORAL | Status: DC | PRN
  Administered 2017-08-11 – 2017-08-14 (×4): 0.25 mg via ORAL
  Filled 2017-08-11 (×4): qty 1

## 2017-08-11 MED ORDER — SODIUM CHLORIDE 0.9 % IV BOLUS
1000.0000 mL | Freq: Once | INTRAVENOUS | Status: AC
  Administered 2017-08-11: 1000 mL via INTRAVENOUS

## 2017-08-11 MED ORDER — ONDANSETRON HCL 4 MG/2ML IJ SOLN: 4.0000 mg | Freq: Four times a day (QID) | INTRAMUSCULAR | Status: DC | PRN

## 2017-08-11 MED ORDER — ONDANSETRON HCL 4 MG PO TABS: 4.0000 mg | ORAL_TABLET | Freq: Four times a day (QID) | ORAL | Status: DC | PRN

## 2017-08-11 NOTE — Care Management Note (Addendum)
Case Management Note  Patient Details  Name: Jorge Clements MRN: [id number] Date of Birth: 12/07/1945  Subjective/Objective:  Adm with weakness, fall at home,? Cellulitis of Lower legs. From home alone, has RW and cane pta. Ex wife and son live next door. [facility], most recent [place] in [place] "a year ago". Patient does not have a PCP. He reports that he still drives. Unsure of his medications, states he was taking a "Aspirin and a statin". When asked who is prescribing, he states someone at [facility]".               Action/Plan: CM following for needs. Patient aware of OBS status. Reports he has Medicare A and Medicaid. States his Medicare B will be active in July. Will try to arrange PCP f/u for patient prior to DC.  Patient also aware that home health could not be started until he has established care with a PCP.    Expected Discharge Date:    unk              Expected Discharge Plan:     In-House Referral:     Discharge planning Services  CM Consult  Post Acute Care Choice:    Choice offered to:     DME Arranged:    DME Agency:     HH Arranged:    HH Agency:     Status of Service:  In process, will continue to follow  If discussed at Long Length of Stay Meetings, dates discussed:    Additional Comments:  Vale Peraza, Chrystine OilerSharley Diane, RN 08/11/2017, 11:16 AM

## 2017-08-11 NOTE — Plan of Care (Signed)
  Problem: Acute Rehab PT Goals(only PT should resolve) Goal: Pt Will Go Supine/Side To Sit Outcome: Progressing Flowsheets (Taken 08/11/2017 1534) Pt will go Supine/Side to Sit: with min guard assist Goal: Patient Will Transfer Sit To/From Stand Outcome: Progressing Flowsheets (Taken 08/11/2017 1534) Patient will transfer sit to/from stand: with min guard assist Goal: Pt Will Transfer Bed To Chair/Chair To Bed Outcome: Progressing Flowsheets (Taken 08/11/2017 1534) Pt will Transfer Bed to Chair/Chair to Bed: min guard assist Goal: Pt Will Ambulate Outcome: Progressing Flowsheets (Taken 08/11/2017 1534) Pt will Ambulate: with minimal assist;25 feet;with rolling walker  3:35 PM, 08/11/17 Ocie BobJames Kert Shackett, MPT Physical Therapist with [facility] 336 [phone] office [phone] mobile phone

## 2017-08-11 NOTE — ED Notes (Signed)
Right foot noted to be edematous with erythema. Large amount of dried yellow skin on foot

## 2017-08-11 NOTE — Progress Notes (Addendum)
Spoke with ex wife on the phone who states she does not want pt to go back home and that she will not accept him back if he cannot take care of himself. States that for the past 3 days she has had to wait on his hand and foot. States pt's bank account was compromised and that is when he started becoming ill, and had a nervous break down. Ex wife and son are requesting to have pt sent either to a nursing home or behavioral mental institute. Pt's son states he believes his father has Munchausen syndrome. Ex wife informed that pt is A & O and able to make his own decisions and it would ultimately be his decision. Ex wife expresses understanding and states she doesn't want him back home.  Case management informed and social work consulted

## 2017-08-11 NOTE — Evaluation (Signed)
Physical Therapy Evaluation Patient Details Name: Jorge Clements MRN: [id number] DOB: 07/09/1945 Today's Date: 08/11/2017   History of Present Illness  Jorge Clements  is a 72 y.o. male, with history of COPD, hypertension and peripheral vascular disease status post stenting of both legs presented with weakness that is ongoing for quite some time (in patient's words) but worse in the past 2-3 days.  He reports that he did not have enough strength to get up and ambulate.  On the evening of admission he fell on his right side and feels that he tripped on something and was very weak.  Denies hitting his head but did sustain some laceration in his right arm.  He also reports pain and swelling of his right foot and calf which has been ongoing for " quite some time" but more painful in the past 2 days.    Clinical Impression  Patient demonstrates slow labored movement for rolling in bed, sitting up and taking steps to transfer to chair.  Patient limited to a few steps mostly due to c/o pain bilateral feet that is  worsened when standing and poor standing balance.  Patient will benefit from continued physical therapy in hospital and recommended venue below to increase strength, balance, endurance for safe ADLs and gait.    Follow Up Recommendations SNF;Supervision/Assistance - 24 hour    Equipment Recommendations  None recommended by PT    Recommendations for Other Services       Precautions / Restrictions Precautions Precautions: Fall Restrictions Weight Bearing Restrictions: No      Mobility  Bed Mobility Overal bed mobility: Needs Assistance Bed Mobility: Supine to Sit     Supine to sit: Mod assist        Transfers Overall transfer level: Needs assistance Equipment used: Rolling walker (2 wheeled) Transfers: Sit to/from [employer];Stand Pivot Transfers Sit to Stand: Min assist Stand pivot transfers: Mod assist       General transfer comment: demonstrates slow labored  movement  Ambulation/Gait Ambulation/Gait assistance: Mod assist Ambulation Distance (Feet): 5 Feet Assistive device: Rolling walker (2 wheeled) Gait Pattern/deviations: Decreased step length - right;Decreased step length - left;Decreased stride length Gait velocity: decreased   General Gait Details: limited to 6-7 short unsteady steps at bedside due to poor standing balance bilateral feet pain right worse than left and fatigue  Stairs            Wheelchair Mobility    Modified Rankin (Stroke Patients Only)       Balance Overall balance assessment: Needs assistance Sitting-balance support: Feet supported;No upper extremity supported Sitting balance-Leahy Scale: Fair Sitting balance - Comments: fair/good   Standing balance support: Bilateral upper extremity supported;During functional activity Standing balance-Leahy Scale: Fair                               Pertinent Vitals/Pain Pain Assessment: 0-10 Pain Score: 8  Pain Location: chronic low back pain, BLE, bilateral feet  Pain Descriptors / Indicators: Aching;Discomfort Pain Intervention(s): Limited activity within patient's tolerance;Monitored during session    Home Living Family/patient expects to be discharged to:: Private residence Living Arrangements: Alone;Other (Comment)(lives in a guest on the same lot with his ex-wife and son) Available Help at Discharge: Family Type of Home: House Home Access: Stairs to enter Entrance Stairs-Rails: None Entrance Stairs-Number of Steps: 2 Home Layout: One level Home Equipment: Cane - single point;Walker - 2 wheels      Prior Function  Level of Independence: Independent with assistive device(s)         Comments: Household ambulation mostly without an AD, sometimes using SPC     Hand Dominance        Extremity/Trunk Assessment   Upper Extremity Assessment Upper Extremity Assessment: Generalized weakness    Lower Extremity Assessment Lower  Extremity Assessment: Generalized weakness    Cervical / Trunk Assessment Cervical / Trunk Assessment: Normal  Communication   Communication: No difficulties  Cognition Arousal/Alertness: Awake/alert Behavior During Therapy: WFL for tasks assessed/performed Overall Cognitive Status: Within Functional Limits for tasks assessed                                        General Comments      Exercises     Assessment/Plan    PT Assessment Patient needs continued PT services  PT Problem List Decreased strength;Decreased activity tolerance;Decreased balance;Decreased mobility       PT Treatment Interventions Gait training;Stair training;Functional mobility training;Therapeutic activities;Therapeutic exercise;Patient/family education    PT Goals (Current goals can be found in the Care Plan section)  Acute Rehab PT Goals Patient Stated Goal: return to his guest house  PT Goal Formulation: With patient Time For Goal Achievement: 08/18/17 Potential to Achieve Goals: Good    Frequency Min 4X/week   Barriers to discharge        Co-evaluation               AM-PAC PT "6 Clicks" Daily Activity  Outcome Measure Difficulty turning over in bed (including adjusting bedclothes, sheets and blankets)?: A Lot Difficulty moving from lying on back to sitting on the side of the bed? : A Lot Difficulty sitting down on and standing up from a chair with arms (e.g., wheelchair, bedside commode, etc,.)?: A Lot Help needed moving to and from a bed to chair (including a wheelchair)?: A Lot Help needed walking in hospital room?: A Lot Help needed climbing 3-5 steps with a railing? : Total 6 Click Score: 11    End of Session   Activity Tolerance: Patient tolerated treatment well;Patient limited by fatigue Patient left: in chair;with call bell/phone within reach Nurse Communication: Mobility status;Other (comment)(RN/NT notified patient left in chair) PT Visit Diagnosis:  Unsteadiness on feet (R26.81);Other abnormalities of gait and mobility (R26.89);Muscle weakness (generalized) (M62.81)    Time: 5409-8119 PT Time Calculation (min) (ACUTE ONLY): 39 min   Charges:   PT Evaluation $PT Eval Moderate Complexity: 1 Mod PT Treatments $Therapeutic Activity: 38-52 mins   PT G Codes:        3:33 PM, 09-06-2017 Ocie Bob, MPT Physical Therapist with [facility] 336 [phone] office [phone] mobile phone

## 2017-08-11 NOTE — ED Notes (Addendum)
CRITICAL VALUE ALERT  Critical Value:  Troponin 0.03   Date & Time Notied:  08/11/17 & 0413 hrs  Provider Notified: Dr. Judd Lienelo  Orders Received/Actions taken: N/A

## 2017-08-11 NOTE — ED Provider Notes (Signed)
[facility] EMERGENCY DEPARTMENT Provider Note   CSN: [id number] Arrival date & time: 08/11/17  0122     History   Chief Complaint Chief Complaint  Patient presents with  . Fever    HPI Sallyanne KusterMichael Bulkley is a 72 y.o. male.  This patient is a 72 year old male with past medical history of COPD and peripheral vascular disease status post stents to both legs.  He presents today for evaluation of weakness.  This is been ongoing for "quite some time", however worse over the past 2 days.  This evening he fell because he did not have the strength to ambulate.  He was brought here by EMS for evaluation.  He was found to have a temperature of 102 by EMS, then brought here for evaluation patient denies any specific complaints such as chest pain, difficulty breathing, abdominal pain, vomiting, sore throat, or other symptoms that would explain his fever.  His main complaint is weakness.  The history is provided by the patient.  Fever   This is a new problem. The current episode started 2 days ago. The problem occurs constantly. The maximum temperature noted was 102 to 102.9 F. The temperature was taken using an oral thermometer. Pertinent negatives include no chest pain, no diarrhea, no vomiting and no sore throat. He has tried nothing for the symptoms.    Past Medical History:  Diagnosis Date  . COPD (chronic obstructive pulmonary disease) (HCC)     There are no active problems to display for this patient.   History reviewed. No pertinent surgical history.      Home Medications    Prior to Admission medications   Medication Sig Start Date End Date Taking? Authorizing Provider  lisinopril-hydrochlorothiazide (PRINZIDE) 20-12.5 MG per tablet Take 1 tablet by mouth daily. Patient not taking: Reported on 03/10/2017 11/23/13   Pricilla LovelessGoldston, Scott, MD  hydrochlorothiazide (HYDRODIURIL) 25 MG tablet Take 0.5 tablets (12.5 mg total) by mouth daily. 11/17/13 11/23/13  Elpidio AnisUpstill, Shari, PA-C    Family  History History reviewed. No pertinent family history.  Social History Social History   Tobacco Use  . Smoking status: Former Games developermoker  . Smokeless tobacco: Never Used  Substance Use Topics  . Alcohol use: Not Currently  . Drug use: No     Allergies   Penicillins   Review of Systems Review of Systems  Constitutional: Positive for fever.  HENT: Negative for sore throat.   Cardiovascular: Negative for chest pain.  Gastrointestinal: Negative for diarrhea and vomiting.  All other systems reviewed and are negative.    Physical Exam Updated Vital Signs BP 138/61   Pulse 87   Temp 99.6 F (37.6 C)   Resp (!) 26   Ht 6' (1.829 m)   Wt 86.2 kg (190 lb)   SpO2 100%   BMI 25.77 kg/m   Physical Exam  Constitutional: He is oriented to person, place, and time. He appears well-developed and well-nourished. No distress.  HENT:  Head: Normocephalic and atraumatic.  Mouth/Throat: Oropharynx is clear and moist.  Neck: Normal range of motion. Neck supple.  Cardiovascular: Normal rate and regular rhythm. Exam reveals no friction rub.  No murmur heard. Pulmonary/Chest: Effort normal and breath sounds normal. No respiratory distress. He has no wheezes. He has no rales.  Abdominal: Soft. Bowel sounds are normal. He exhibits no distension. There is no tenderness.  Musculoskeletal: Normal range of motion. He exhibits no edema.  The right lower extremity shows chronic findings of venous stasis.  Lymphadenopathy:  He has no cervical adenopathy.  Neurological: He is alert and oriented to person, place, and time. Coordination normal.  Skin: Skin is warm and dry. He is not diaphoretic.  Nursing note and vitals reviewed.    ED Treatments / Results  Labs (all labs ordered are listed, but only abnormal results are displayed) Labs Reviewed  BASIC METABOLIC PANEL - Abnormal; Notable for the following components:      Result Value   Sodium 132 (*)    Potassium 3.4 (*)    Chloride 99  (*)    Glucose, Bld 130 (*)    Calcium 8.5 (*)    All other components within normal limits  CBC - Abnormal; Notable for the following components:   WBC 10.6 (*)    Hemoglobin 12.8 (*)    HCT 37.6 (*)    Platelets 76 (*)    All other components within normal limits  URINALYSIS, ROUTINE W REFLEX MICROSCOPIC  TROPONIN I  I-STAT CG4 LACTIC ACID, ED    EKG None  Radiology No results found.  Procedures Procedures (including critical care time)  Medications Ordered in ED Medications  sodium chloride 0.9 % bolus 1,000 mL (has no administration in time range)     Initial Impression / Assessment and Plan / ED Course  I have reviewed the triage vital signs and the nursing notes.  Pertinent labs & imaging results that were available during my care of the patient were reviewed by me and considered in my medical decision making (see chart for details).  Patient is a 72 year old male brought by EMS for evaluation of severe weakness.  He was found by EMS to be febrile, however was afebrile upon presentation here.    His work-up reveals a mild leukocytosis, but is otherwise essentially unremarkable.  He does have some swelling, redness, and warmth to the right foot and lower leg consistent with cellulitis.  Urinalysis is pending.  I have spoken with Dr. Sherryll Burger from the hospitalist service who agrees to have the patient admitted.  Final Clinical Impressions(s) / ED Diagnoses   Final diagnoses:  None    ED Discharge Orders    None       Geoffery Lyons, MD 08/11/17 [phone]

## 2017-08-11 NOTE — Care Management Obs Status (Signed)
MEDICARE OBSERVATION STATUS NOTIFICATION   Patient Details  Name: Jorge Clements MRN: [id number] Date of Birth: 05/07/1945   Medicare Observation Status Notification Given:  Yes    Nikodem Leadbetter, Chrystine OilerSharley Diane, RN 08/11/2017, 11:16 AM

## 2017-08-11 NOTE — ED Notes (Signed)
Patient transported to CT 

## 2017-08-11 NOTE — ED Triage Notes (Signed)
Pt c/o generalized weakness, fever, and chills x 3 days. Pt fell using walker in bathroom, he has skin tear to the left elbow and c/o back pain. Pt states he has chronic back pain.

## 2017-08-11 NOTE — H&P (Addendum)
TRH H&P   Patient Demographics:    Jorge Clements, is a 72 y.o. male  MRN: [id number]   DOB - 07/10/1945  Admit Date - 08/11/2017  Outpatient Primary MD for the patient is Patient, No Pcp Per  Referring MD: Dr Judd Lien  Outpatient Specialists: None  Patient coming from: Home  Chief Complaint  Patient presents with  . Fever      HPI:    Jorge Clements  is a 72 y.o. male, with history of COPD, hypertension and peripheral vascular disease status post stenting of both legs presented with weakness that is ongoing for quite some time (in patient's words) but worse in the past 2-3 days.  He reports that he did not have enough strength to get up and ambulate.  On the evening of admission he fell on his right side and feels that he tripped on something and was very weak.  Denies hitting his head but did sustain some laceration in his right arm.  He also reports pain and swelling of his right foot and calf which has been ongoing for " quite some time" but more painful in the past 2 days. Patient denies any headache, blurred vision, dizziness, nausea, vomiting, chills, chest pain, shortness of breath, palpitations, sore throat, body aches, abdominal pain, dysuria, diarrhea, tingling or numbness of the extremity.  Denies any recent travel or sick contact. EMS found him to have a fever of 102 F.  Patient reports he has not seen PCP in a long time.  In the ED he had low-grade temperature of 99.6 F, remaining vitals were stable.  Blood work showed WBC of 10.6, hemoglobin of 12.8, platelets of 76, sodium of 132, K of 3.4, chloride of 99 and mildly elevated troponin of 0.03.  EKG showed normal sinus rhythm with no ST-T changes.  Lactic acid was normal. Chest x-ray was unremarkable and UA negative for infection Hospitalist consulted for observation with findings of possible cellulitis of the right  lower leg given warmth and swelling with tenderness.    Review of systems:    In addition to the HPI above,  Fever ++, no chills No Headache, No changes with Vision or hearing, No problems swallowing food or Liquids, No Chest pain, Cough or Shortness of Breath, No Abdominal pain, No Nausea or vomiting, Bowel movements are regular, No Blood in stool or Urine, No dysuria, No new skin rashes or bruises, No new joints pains-aches,  Right-sided weakness, pain and swelling of the right leg, tingling, numbness in any extremity, No recent weight gain or loss, No polyuria, polydypsia or polyphagia, No significant Mental Stressors.    With Past History of the following :    Past Medical History:  Diagnosis Date  . COPD (chronic obstructive pulmonary disease) (HCC)       Past surgical history None reported by patient   Social History:  Social History   Tobacco Use  . Smoking status: Former Games developermoker  . Smokeless tobacco: Never Used  Substance Use Topics  . Alcohol use: Not Currently     Lives -alone with his ex-wife living next door  Mobility -reports using a cane     Family History :   No significant family history of heart disease, stroke or diabetes   Home Medications:   Prior to Admission medications   Medication Sig Start Date End Date Taking? Authorizing Provider  lisinopril-hydrochlorothiazide (PRINZIDE) 20-12.5 MG per tablet Take 1 tablet by mouth daily. Patient not taking: Reported on 03/10/2017 11/23/13   Pricilla LovelessGoldston, Scott, MD  hydrochlorothiazide (HYDRODIURIL) 25 MG tablet Take 0.5 tablets (12.5 mg total) by mouth daily. 11/17/13 11/23/13  Elpidio AnisUpstill, Shari, PA-C     Allergies:     Allergies  Allergen Reactions  . Penicillins Hives    Childhood reaction Has patient had a PCN reaction causing immediate rash, facial/tongue/throat swelling, SOB or lightheadedness with hypotension: No Has patient had a PCN reaction causing severe rash involving mucus membranes  or skin necrosis: Yes Has patient had a PCN reaction that required hospitalization: Unknown Has patient had a PCN reaction occurring within the last 10 years: No If all of the above answers are "NO", then may proceed with Cephalosporin use.     Physical Exam:   Vitals  Blood pressure 115/68, pulse 85, temperature 98.3 F (36.8 C), temperature source Oral, resp. rate 19, height 6' (1.829 m), weight 86.2 kg (190 lb), SpO2 98 %.    General: Elderly male appears disheveled lying in bed, not in distress HEENT: Pupils reactive bilaterally, EOMI, no pallor, no icterus, moist oral mucosa, mild temporal wasting, supple neck Chest: Clear to auscultation bilaterally, no added sound CVS: Normal S1 and S2, no murmurs rubs or gallop GI: Soft, nondistended, nontender, bowel sounds present Musculoskeletal: Chronic venous stasis changes over right foot with mild swelling of the right calf area with some warmth and tenderness to pressure.  Skin splits over right forearm and elbow with limited mobility due to pain. CNS: Alert and oriented, limited mobility of the right leg due to pain, normal strength and tone in all other extremities.      Data Review:    CBC Recent Labs  Lab 08/11/17 0221  WBC 10.6*  HGB 12.8*  HCT 37.6*  PLT 76*  MCV 87.9  MCH 29.9  MCHC 34.0  RDW 14.3   ------------------------------------------------------------------------------------------------------------------  Chemistries  Recent Labs  Lab 08/11/17 0221  NA 132*  K 3.4*  CL 99*  CO2 22  GLUCOSE 130*  BUN 14  CREATININE 1.03  CALCIUM 8.5*   ------------------------------------------------------------------------------------------------------------------ estimated creatinine clearance is 74.3 mL/min (by C-G formula based on SCr of 1.03 mg/dL). ------------------------------------------------------------------------------------------------------------------ No results for input(s): TSH, T4TOTAL, T3FREE,  THYROIDAB in the last 72 hours.  Invalid input(s): FREET3  Coagulation profile No results for input(s): INR, PROTIME in the last 168 hours. ------------------------------------------------------------------------------------------------------------------- No results for input(s): DDIMER in the last 72 hours. -------------------------------------------------------------------------------------------------------------------  Cardiac Enzymes Recent Labs  Lab 08/11/17 0221  TROPONINI 0.03*   ------------------------------------------------------------------------------------------------------------------ No results found for: BNP   ---------------------------------------------------------------------------------------------------------------  Urinalysis    Component Value Date/Time   COLORURINE YELLOW 08/11/2017 0642   APPEARANCEUR CLEAR 08/11/2017 0642   LABSPEC 1.017 08/11/2017 0642   PHURINE 6.0 08/11/2017 0642   GLUCOSEU NEGATIVE 08/11/2017 0642   HGBUR MODERATE (A) 08/11/2017 0642   BILIRUBINUR NEGATIVE 08/11/2017 0642   KETONESUR 5 (A) 08/11/2017 [id number]  PROTEINUR 30 (A) 08/11/2017 0642   UROBILINOGEN 0.2 11/17/2013 1301   NITRITE NEGATIVE 08/11/2017 0642   LEUKOCYTESUR NEGATIVE 08/11/2017 0642    ----------------------------------------------------------------------------------------------------------------   Imaging Results:    Dg Chest 2 View  Result Date: 08/11/2017 CLINICAL DATA:  Fever and weakness, chills for 3 days. History of COPD. EXAM: CHEST - 2 VIEW COMPARISON:  None. FINDINGS: Cardiomediastinal silhouette is normal. Calcified aortic arch. No pleural effusions or focal consolidations. Mild hyperinflation. Trachea projects midline and there is no pneumothorax. Soft tissue planes and included osseous structures are non-suspicious. Mild degenerative change of thoracic spine. IMPRESSION: Mild hyperinflation without focal consolidation. Aortic Atherosclerosis  (ICD10-I70.0). Electronically Signed   By: Awilda Metro M.D.   On: 08/11/2017 04:00    My personal review of EKG:   Assessment & Plan:    Principal Problem: Generalized weakness with fall Etiology unclear.  Head CT negative.  Possibly due to infection and poor p.o. intake. Placed on observation.  IV hydration with normal saline. Check orthostasis, TSH and B12.  PT evaluation.  Active Problems: Cellulitis of right leg Possibly contributing to his fever.  Placed on empiric clindamycin.  Check Doppler lower extremity to rule out DVT.    COPD (chronic obstructive pulmonary disease) (HCC) He reports he has quit smoking and currently does vaping.  Counseled on cessation.  Currently asymptomatic.  He is not on any medications at home.  Elevated troponin Mild ST changes noted on inferior leads.  Patient denies any chest pain symptoms.  We will cycle enzymes and monitor.  Hypokalemia Replenished  Hyponatremia Possibly due to dehydration.  Monitor with fluids.  Hold HCTZ-ARB.  Peripheral vascular disease with?  History of stenting Not on any medications.  Addendum: Doppler lower extremity negative for DVT  Thrombocytopenia Unexplained.  Check peripheral smear.   DVT Prophylaxis SCDs  AM Labs Ordered, also please review Full Orders  Family Communication: Admission, patients condition and plan of care including tests being ordered have been discussed with the patient   Code Status DNR (as wished by patient)  Likely DC to home tomorrow if improved  Condition: Fair  Consults called: None  Admission status: Observation  Time spent in minutes : 45   Wadsworth Skolnick M.D on 08/11/2017 at 8:20 AM  Between 7am to 7pm - Pager - [phone]. After 7pm go to [email or web address] - password [facility]  [facility] - Office  [phone]

## 2017-08-11 NOTE — Progress Notes (Signed)
Pt requesting to be DNR. Patient educated and understands that we will not do CPR or intubation. Pt verbalizes agreement.

## 2017-08-12 DIAGNOSIS — R531 Weakness: Secondary | ICD-10-CM | POA: Diagnosis not present

## 2017-08-12 DIAGNOSIS — L03115 Cellulitis of right lower limb: Secondary | ICD-10-CM | POA: Diagnosis not present

## 2017-08-12 DIAGNOSIS — L8990 Pressure ulcer of unspecified site, unspecified stage: Secondary | ICD-10-CM

## 2017-08-12 LAB — BASIC METABOLIC PANEL
ANION GAP: 14 (ref 5–15)
BUN: 15 mg/dL (ref 6–20)
CALCIUM: 8.3 mg/dL — AB (ref 8.9–10.3)
CO2: 18 mmol/L — ABNORMAL LOW (ref 22–32)
CREATININE: 0.9 mg/dL (ref 0.61–1.24)
Chloride: 100 mmol/L — ABNORMAL LOW (ref 101–111)
Glucose, Bld: 96 mg/dL (ref 65–99)
Potassium: 3.6 mmol/L (ref 3.5–5.1)
Sodium: 132 mmol/L — ABNORMAL LOW (ref 135–145)

## 2017-08-12 LAB — CBC
HCT: 37.7 % — ABNORMAL LOW (ref 39.0–52.0)
HEMOGLOBIN: 12.7 g/dL — AB (ref 13.0–17.0)
MCH: 30.2 pg (ref 26.0–34.0)
MCHC: 33.7 g/dL (ref 30.0–36.0)
MCV: 89.5 fL (ref 78.0–100.0)
PLATELETS: 94 10*3/uL — AB (ref 150–400)
RBC: 4.21 MIL/uL — AB (ref 4.22–5.81)
RDW: 14.7 % (ref 11.5–15.5)
WBC: 11 10*3/uL — ABNORMAL HIGH (ref 4.0–10.5)

## 2017-08-12 LAB — HIV ANTIBODY (ROUTINE TESTING W REFLEX): HIV SCREEN 4TH GENERATION: NONREACTIVE

## 2017-08-12 MED ORDER — TRAMADOL-ACETAMINOPHEN 37.5-325 MG PO TABS
2.0000 | ORAL_TABLET | Freq: Four times a day (QID) | ORAL | Status: DC | PRN
  Administered 2017-08-12 – 2017-08-20 (×10): 2 via ORAL
  Filled 2017-08-12 (×10): qty 2

## 2017-08-12 MED ORDER — CYANOCOBALAMIN 1000 MCG/ML IJ SOLN
1000.0000 ug | Freq: Once | INTRAMUSCULAR | Status: AC
  Administered 2017-08-12: 1000 ug via INTRAMUSCULAR
  Filled 2017-08-12: qty 1

## 2017-08-12 NOTE — Progress Notes (Signed)
Pt up in chair. Blood noted on floor coming from pt's right foot. On the bottom of pt's right foot is a ulcer surrounded by a blood filled blister that was present on admission, originally thought to be a deep tissue injury. Foot cleansed with warm water, gauze applied at base of blister. Per pt request sock on.

## 2017-08-12 NOTE — Care Management (Addendum)
Worked with patient to fill out new patient packet to establish care with Dr. Selena BattenKim. Patient will need to establish care for follow up and so that Home health can be arranged.  Discussed this with patient and ex-wife who helps care for patient.

## 2017-08-12 NOTE — Clinical Social Work Note (Signed)
Clinical Social Work Assessment  Patient Details  Name: Jorge Clements MRN: [id number] Date of Birth: January 02, 1946  Date of referral:  08/12/17               Reason for consult:  Facility Placement, Discharge Planning                Permission sought to share information with:  Family Supports Permission granted to share information::     Name::     Jorge Clements (ex wife) Jorge Clements (son)  Agency::     Relationship::     Contact Information:     Housing/Transportation Living arrangements for the past 2 months:  Single Family Home Source of Information:  Patient, Adult Children Patient Interpreter Needed:  None Criminal Activity/Legal Involvement Pertinent to Current Situation/Hospitalization:  No - Comment as needed Significant Relationships:  Adult Children Lives with:  Self Do you feel safe going back to the place where you live?  Yes Need for family participation in patient care:  Yes (Comment)  Care giving concerns: Pt resides in the guest house on his ex wife's property. She states he cannot return until he can take care of himself.    Social Worker assessment / plan: Pt is a 72 year old male referred to CSW for dc planning. PT evaluated pt and recommended SNF for STR. Pt has Medicare only and he is observation status so does not have insurance benefit for SNF. Met with pt to discuss. Per pt, he does not have the means to pay privately for SNF. He understands that his ex wife won't let him come back until he is independent. Pt does not have a PCP and so cannot have [facility] until he is established with a PCP. RN CM has arranged an appointment for next week. Pt could have [facility] after that. Pt gave LCSW permission to speak with his ex wife and son by phone.   Spoke with pt's son who asks for a psych consult for his dad stating that he thinks he has a mental health diagnosis. He states pt acts "manic" and then spends all his monthly income and when the money is gone, then he goes into a "depression'  and suddenly can't walk. Son states that this has happened once every year for the past five years. Son states pt has a long history of alcohol abuse but that he hasn't drank since Thanksgiving of 2018 when pt's ex wife came to [place] where pt was living with his son and brought him back to her residence here in [place]. Pt's son states that his mother won't let pt drink.   Pt was discussed at length in [facility] today. PT to work with pt while he is here in the hospital to rehab as much as possible. MD to order TTS consult. LCSW will follow.  Employment status:  Retired Forensic scientist:  Medicare PT Recommendations:  [place] / Referral to community resources:     Patient/Family's Response to care: Pt is accepting of care but he is frustrated about not getting rest and being told he can be discharged when he can't even walk.  Patient/Family's Understanding of and Emotional Response to Diagnosis, Current Treatment, and Prognosis: Unclear if pt understands diagnosis. He does understand treatment recommendations. Pt angry that he cannot go to SNF rehab. Emotional support provided.  Emotional Assessment Appearance:  Appears stated age Attitude/Demeanor/Rapport:  Angry Affect (typically observed):  Angry Orientation:  Oriented to Self, Oriented to Place, Oriented  to  Time, Oriented to Situation Alcohol / Substance use:  Not Applicable Psych involvement (Current and /or in the community):  No (Comment)  Discharge Needs  Concerns to be addressed:  Discharge Planning Concerns Readmission within the last 30 days:  No Current discharge risk:  Physical Impairment Barriers to Discharge:  Family Issues   Shade Flood, LCSW 08/12/2017, 1:14 PM

## 2017-08-12 NOTE — Progress Notes (Signed)
PROGRESS NOTE                                                                                                                                                                                                             Patient Demographics:    Jorge KusterMichael Clements, is a 72 y.o. male, DOB - 12/18/1945, ZOX:[id number]  Admit date - 08/11/2017   Admitting Physician Pratik Hoover Brunette Shah, DO  Outpatient Primary MD for the patient is Patient, No Pcp Per  LOS - 0  Outpatient Specialists: none  Chief Complaint  Patient presents with  . Fever       Brief Narrative   72 y.o. male, with history of COPD, hypertension and peripheral vascular disease status post stenting of both legs presented with weakness that is ongoing for quite some time (in patient's words) but worse in the past 2-3 days.  He reports that he did not have enough strength to get up and ambulate.  On the evening of admission he fell on his right side and feels that he tripped on something and was very weak.  Denies hitting his head but did sustain some laceration in his right arm.  He also reports pain and swelling of his right foot and calf which has been ongoing for " quite some time" but more painful in the past 2 days.  Patient placed in observation for findings of right leg cellulitis.   Subjective:   Complains of pain in his bilateral foot.   Assessment  & Plan :    Principal Problem: Generalized lower extremity weakness. Reports fall at home.  Head CT without acute findings.  Seen by PT and recommended SNF.  12 low (148) and given 1 dose of IM vitamin B12.  Needs monthly injections.  TSH normal.  Negative HIV antibody.  Spoke with his ex-wife and she informs that patient has been living in her detached space in her house and does not have any ownership of that place.  Prior to 3 days back patient was ambulating without any issues, driving and doing groceries but then  told her and his son that his bank account was messed up and he only had about $30 left since then he has been acting strange in his bank account.  And started complaining of weakness in his legs and not able to ambulate.  Wife and his son think that patient is suddenly acting strange after he found about losing his bank account and having tangential thoughts.  Telemetry psych consulted.  Active Problems: Cellulitis of right leg Mild erythema and swelling has improved from yesterday.  Continue clindamycin.  Has chronic right leg changes with blisters (wife reports they have been chronic and has a small ulcer as well).  Doppler right leg negative for DVT.  Afebrile.  Wound care consulted.    COPD (chronic obstructive pulmonary disease) (HCC) Reports vaping at present.  Counseled on cessation.  Asymptomatic.  B12 deficiency Received IM cyanocobalamin.  Check folate.    Thrombocytopenia (HCC) No clear etiology.  Follow peripheral smear.  Hypokalemia Replenished.  Hyponatremia Secondary to dehydration.  Received IV fluids.  Holding HCTZ-ARB     Pressure injury of skin Blister over right foot.  Wound care consulted.  Generalized weakness Patient reports difficulty ambulating and pain.  PT recommends SNF.  Family however concerned that patient has been suddenly acting this way after suddenly finding out about losing his bank account.  Psych consulted.  Elevated troponin Mild.  No EKG changes or chest pain symptoms.  Code Status : DNR  Family Communication  : Discussed with ex-wife on the phone  Disposition Plan  : Possibly SNF.  Does not have a PCP to arrange home health.  Also per his ex-wife she cannot take care of him at home.  Barriers For Discharge : Pending placement, there is a consult  Consults  : Tele psych  Procedures  : CT head/Doppler right leg  DVT Prophylaxis  : SCDs  Lab Results  Component Value Date   PLT 94 (L) 08/12/2017    Antibiotics  :     Anti-infectives (From admission, onward)   Start     Dose/Rate Route Frequency Ordered Stop   08/11/17 1000  clindamycin (CLEOCIN) IVPB 600 mg     600 mg 100 mL/hr over 30 Minutes Intravenous Every 8 hours 08/11/17 0853          Objective:   Vitals:   08/11/17 1900 08/11/17 2229 08/12/17 0500 08/12/17 1355  BP:  (!) 114/50 (!) 139/50 (!) 94/55  Pulse:  82 78 70  Resp:    (!) 22  Temp: 98.4 F (36.9 C) 100.2 F (37.9 C) 98.4 F (36.9 C) 98.6 F (37 C)  TempSrc: Oral Oral Oral Oral  SpO2:  99% 98% 98%  Weight:      Height:        Wt Readings from Last 3 Encounters:  08/11/17 87 kg (191 lb 12.8 oz)  11/23/13 103.4 kg (228 lb)  11/17/13 103.4 kg (228 lb)     Intake/Output Summary (Last 24 hours) at 08/12/2017 1707 Last data filed at 08/12/2017 1342 Gross per 24 hour  Intake 1755 ml  Output 400 ml  Net 1355 ml     Physical Exam  Gen: not in distress HEENT: moist mucosa, supple neck Chest: clear b/l,  CVS: N S1&S2, no murmurs,  GI: soft, NT, ND, Musculoskeletal: Warmth and swelling over right calf has improved, chronic skin changes and blister over right foot CNS: Alert and oriented,    Data Review:    CBC Recent Labs  Lab 08/11/17 0221 08/12/17 0406  WBC 10.6* 11.0*  HGB 12.8* 12.7*  HCT 37.6* 37.7*  PLT 76* 94*  MCV 87.9 89.5  MCH 29.9 30.2  MCHC 34.0 33.7  RDW 14.3 14.7    Chemistries  Recent Labs  Lab 08/11/17 0221 08/12/17 0406  NA 132* 132*  K 3.4* 3.6  CL 99* 100*  CO2 22 18*  GLUCOSE 130* 96  BUN 14 15  CREATININE 1.03 0.90  CALCIUM 8.5* 8.3*   ------------------------------------------------------------------------------------------------------------------ No results for input(s): CHOL, HDL, LDLCALC, TRIG, CHOLHDL, LDLDIRECT in the last 72 hours.  No results found for: HGBA1C ------------------------------------------------------------------------------------------------------------------ Recent Labs    08/11/17 1046   TSH 1.667   ------------------------------------------------------------------------------------------------------------------ Recent Labs    08/11/17 1046  VITAMINB12 148*    Coagulation profile No results for input(s): INR, PROTIME in the last 168 hours.  No results for input(s): DDIMER in the last 72 hours.  Cardiac Enzymes Recent Labs  Lab 08/11/17 0923 08/11/17 1513 08/11/17 2029  TROPONINI 0.03* 0.03* <0.03   ------------------------------------------------------------------------------------------------------------------ No results found for: BNP  Inpatient Medications  Scheduled Meds: Continuous Infusions: . clindamycin (CLEOCIN) IV Stopped (08/12/17 1342)   PRN Meds:.acetaminophen **OR** acetaminophen, ALPRAZolam, ondansetron **OR** ondansetron (ZOFRAN) IV, traMADol-acetaminophen  Micro Results Recent Results (from the past 240 hour(s))  MRSA PCR Screening     Status: None   Collection Time: 08/11/17  9:10 AM  Result Value Ref Range Status   MRSA by PCR NEGATIVE NEGATIVE Final    Comment:        The GeneXpert MRSA Assay (FDA approved for NASAL specimens only), is one component of a comprehensive MRSA colonization surveillance program. It is not intended to diagnose MRSA infection nor to guide or monitor treatment for MRSA infections. Performed at [facility], [address], [place], [place] [address]     Radiology Reports Dg Chest 2 View  Result Date: 08/11/2017 CLINICAL DATA:  Fever and weakness, chills for 3 days. History of COPD. EXAM: CHEST - 2 VIEW COMPARISON:  None. FINDINGS: Cardiomediastinal silhouette is normal. Calcified aortic arch. No pleural effusions or focal consolidations. Mild hyperinflation. Trachea projects midline and there is no pneumothorax. Soft tissue planes and included osseous structures are non-suspicious. Mild degenerative change of thoracic spine. IMPRESSION: Mild hyperinflation without focal consolidation. Aortic  Atherosclerosis (ICD10-I70.0). Electronically Signed   By: Awilda Metro M.D.   On: 08/11/2017 04:00   Ct Head Wo Contrast  Result Date: 08/11/2017 CLINICAL DATA:  Syncope.  COPD. EXAM: CT HEAD WITHOUT CONTRAST TECHNIQUE: Contiguous axial images were obtained from the base of the skull through the vertex without intravenous contrast. COMPARISON:  None. FINDINGS: Brain: No evidence of acute infarction, hemorrhage, extra-axial collection, ventriculomegaly, or mass effect. Generalized cerebral atrophy. Periventricular white matter low attenuation likely secondary to microangiopathy. Vascular: Cerebrovascular atherosclerotic calcifications are noted. Skull: Negative for fracture or focal lesion. Sinuses/Orbits: Visualized portions of the orbits are unremarkable. Visualized portions of the paranasal sinuses and mastoid air cells are unremarkable. Other: None. IMPRESSION: No acute intracranial pathology. Electronically Signed   By: Elige Ko   On: 08/11/2017 09:03   US Venous Img Lower Unilateral Right  Result Date: 08/11/2017 CLINICAL DATA:  72 year old male with right lower extremity pain and swelling from the knee to the foot. EXAM: RIGHT LOWER EXTREMITY VENOUS DOPPLER ULTRASOUND TECHNIQUE: Gray-scale sonography with graded compression, as well as color Doppler and duplex ultrasound were performed to evaluate the lower extremity deep venous systems from the level of the common femoral vein and including the common femoral, femoral, profunda femoral, popliteal and calf veins including the posterior tibial, peroneal and gastrocnemius veins when visible. The superficial great saphenous vein was also interrogated. Spectral Doppler was utilized to evaluate flow at rest and with distal augmentation maneuvers  in the common femoral, femoral and popliteal veins. COMPARISON:  None. FINDINGS: Contralateral Common Femoral Vein: Respiratory phasicity is normal and symmetric with the symptomatic side. No evidence of  thrombus. Normal compressibility. Common Femoral Vein: No evidence of thrombus. Normal compressibility, respiratory phasicity and response to augmentation. Saphenofemoral Junction: No evidence of thrombus. Normal compressibility and flow on color Doppler imaging. Profunda Femoral Vein: No evidence of thrombus. Normal compressibility and flow on color Doppler imaging. Femoral Vein: No evidence of thrombus. Normal compressibility, respiratory phasicity and response to augmentation. Popliteal Vein: No evidence of thrombus. Normal compressibility, respiratory phasicity and response to augmentation. Calf Veins: No evidence of thrombus. Normal compressibility and flow on color Doppler imaging. Superficial Great Saphenous Vein: No evidence of thrombus. Normal compressibility. Venous Reflux:  None. Other Findings:  None. IMPRESSION: No evidence of deep venous thrombosis. Electronically Signed   By: Malachy Moan M.D.   On: 08/11/2017 12:35    Time Spent in minutes  25   Osmar Howton M.D on 08/12/2017 at 5:07 PM  Between 7am to 7pm - Pager - [phone]  After 7pm go to [email or web address] - password [facility]  [facility] -  Office  [phone]

## 2017-08-12 NOTE — Progress Notes (Signed)
Physical Therapy Treatment Patient Details Name: Jorge Clements MRN: [id number] DOB: 07-15-1945 Today's Date: 08/12/2017    History of Present Illness Jorge Clements  is a 72 y.o. male, with history of COPD, hypertension and peripheral vascular disease status post stenting of both legs presented with weakness that is ongoing for quite some time (in patient's words) but worse in the past 2-3 days.  He reports that he did not have enough strength to get up and ambulate.  On the evening of admission he fell on his right side and feels that he tripped on something and was very weak.  Denies hitting his head but did sustain some laceration in his right arm.  He also reports pain and swelling of his right foot and calf which has been ongoing for " quite some time" but more painful in the past 2 days.    PT Comments    Patient continues to have difficulty sitting up at bedside due to generalized weakness, demonstrates improvement for sit to stands, transfers, and taking steps in room using RW, mostly limited due to c/o fatigue, bilateral foot pain and bleeding from distal plantar surface of right foot after standing/taking steps.  Patient tolerated sitting up in chair with BLE elevated - RN/NT notified concerning bleeding right foot.  Patient will benefit from continued physical therapy in hospital and recommended venue below to increase strength, balance, endurance for safe ADLs and gait.   Follow Up Recommendations  SNF;Supervision/Assistance - 24 hour     Equipment Recommendations  None recommended by PT    Recommendations for Other Services       Precautions / Restrictions Precautions Precautions: Fall Restrictions Weight Bearing Restrictions: No    Mobility  Bed Mobility Overal bed mobility: Needs Assistance Bed Mobility: Supine to Sit     Supine to sit: Mod assist     General bed mobility comments: demonstrates slow labore movement with much time to scoot to  bedside  Transfers Overall transfer level: Needs assistance Equipment used: Rolling walker (2 wheeled) Transfers: Sit to/from [employer] Sit to Stand: Min assist Stand pivot transfers: Min assist       General transfer comment: demonstrates improvement in standing balance   Ambulation/Gait Ambulation/Gait assistance: Min assist Ambulation Distance (Feet): 12 Feet Assistive device: Rolling walker (2 wheeled) Gait Pattern/deviations: Decreased step length - right;Decreased step length - left;Decreased stride length Gait velocity: decreased   General Gait Details: unsteady very short steps without loss of balance, limited secondary to c/o fatigue and bleeding from right foot distal plantar blister   Stairs             Wheelchair Mobility    Modified Rankin (Stroke Patients Only)       Balance Overall balance assessment: Needs assistance Sitting-balance support: Feet supported;No upper extremity supported Sitting balance-Leahy Scale: Good     Standing balance support: Bilateral upper extremity supported;During functional activity Standing balance-Leahy Scale: Fair                              Cognition Arousal/Alertness: Awake/alert Behavior During Therapy: WFL for tasks assessed/performed Overall Cognitive Status: Within Functional Limits for tasks assessed                                        Exercises General Exercises - Lower Extremity Long Arc Quad: Seated;AROM;Strengthening;Both;10  reps Hip Flexion/Marching: Seated;AROM;Strengthening;Both;10 reps Toe Raises: Seated;AROM;Strengthening;Both;10 reps Heel Raises: Seated;AROM;Strengthening;Both;10 reps    General Comments        Pertinent Vitals/Pain Pain Assessment: 0-10 Pain Score: 10-Worst pain ever Pain Location: bilateral feet Pain Descriptors / Indicators: Aching;Discomfort;Guarding Pain Intervention(s): Limited activity within patient's  tolerance;Monitored during session;Premedicated before session    Home Living                      Prior Function            PT Goals (current goals can now be found in the care plan section) Acute Rehab PT Goals Patient Stated Goal: return to his guest house  PT Goal Formulation: With patient Time For Goal Achievement: 08/18/17 Potential to Achieve Goals: Good Progress towards PT goals: Progressing toward goals    Frequency    Min 4X/week      PT Plan Current plan remains appropriate    Co-evaluation              AM-PAC PT "6 Clicks" Daily Activity  Outcome Measure  Difficulty turning over in bed (including adjusting bedclothes, sheets and blankets)?: A Little Difficulty moving from lying on back to sitting on the side of the bed? : A Lot Difficulty sitting down on and standing up from a chair with arms (e.g., wheelchair, bedside commode, etc,.)?: A Little Help needed moving to and from a bed to chair (including a wheelchair)?: A Lot Help needed walking in hospital room?: A Lot Help needed climbing 3-5 steps with a railing? : Total 6 Click Score: 13    End of Session   Activity Tolerance: Patient tolerated treatment well;Patient limited by fatigue Patient left: in chair;with call bell/phone within reach Nurse Communication: Mobility status;Other (comment)(RN/NT notified that patient left up in chair and has bleeding from right foot distal plantar blister) PT Visit Diagnosis: Unsteadiness on feet (R26.81);Other abnormalities of gait and mobility (R26.89);Muscle weakness (generalized) (M62.81)     Time: [id number] PT Time Calculation (min) (ACUTE ONLY): 27 min  Charges:  $Therapeutic Exercise: 8-22 mins $Therapeutic Activity: 8-22 mins                    G Codes:       2:35 PM, 08/12/17 Ocie BobJames Thornton Dohrmann, MPT Physical Therapist with [facility] 336 [phone] office [phone] mobile phone

## 2017-08-13 DIAGNOSIS — Y92009 Unspecified place in unspecified non-institutional (private) residence as the place of occurrence of the external cause: Secondary | ICD-10-CM | POA: Diagnosis not present

## 2017-08-13 DIAGNOSIS — S90829A Blister (nonthermal), unspecified foot, initial encounter: Secondary | ICD-10-CM

## 2017-08-13 DIAGNOSIS — S90821A Blister (nonthermal), right foot, initial encounter: Secondary | ICD-10-CM | POA: Diagnosis present

## 2017-08-13 DIAGNOSIS — D62 Acute posthemorrhagic anemia: Secondary | ICD-10-CM | POA: Diagnosis not present

## 2017-08-13 DIAGNOSIS — Z888 Allergy status to other drugs, medicaments and biological substances status: Secondary | ICD-10-CM | POA: Diagnosis not present

## 2017-08-13 DIAGNOSIS — E860 Dehydration: Secondary | ICD-10-CM | POA: Diagnosis present

## 2017-08-13 DIAGNOSIS — D696 Thrombocytopenia, unspecified: Secondary | ICD-10-CM | POA: Diagnosis present

## 2017-08-13 DIAGNOSIS — Z9889 Other specified postprocedural states: Secondary | ICD-10-CM | POA: Diagnosis not present

## 2017-08-13 DIAGNOSIS — Z720 Tobacco use: Secondary | ICD-10-CM | POA: Diagnosis not present

## 2017-08-13 DIAGNOSIS — L03115 Cellulitis of right lower limb: Secondary | ICD-10-CM | POA: Diagnosis present

## 2017-08-13 DIAGNOSIS — Y838 Other surgical procedures as the cause of abnormal reaction of the patient, or of later complication, without mention of misadventure at the time of the procedure: Secondary | ICD-10-CM | POA: Diagnosis present

## 2017-08-13 DIAGNOSIS — Z66 Do not resuscitate: Secondary | ICD-10-CM | POA: Diagnosis present

## 2017-08-13 DIAGNOSIS — S41111A Laceration without foreign body of right upper arm, initial encounter: Secondary | ICD-10-CM | POA: Diagnosis present

## 2017-08-13 DIAGNOSIS — E876 Hypokalemia: Secondary | ICD-10-CM | POA: Diagnosis present

## 2017-08-13 DIAGNOSIS — R945 Abnormal results of liver function studies: Secondary | ICD-10-CM | POA: Diagnosis not present

## 2017-08-13 DIAGNOSIS — D649 Anemia, unspecified: Secondary | ICD-10-CM | POA: Diagnosis not present

## 2017-08-13 DIAGNOSIS — R531 Weakness: Secondary | ICD-10-CM | POA: Diagnosis present

## 2017-08-13 DIAGNOSIS — F17290 Nicotine dependence, other tobacco product, uncomplicated: Secondary | ICD-10-CM | POA: Diagnosis present

## 2017-08-13 DIAGNOSIS — L02611 Cutaneous abscess of right foot: Secondary | ICD-10-CM | POA: Diagnosis not present

## 2017-08-13 DIAGNOSIS — E538 Deficiency of other specified B group vitamins: Secondary | ICD-10-CM | POA: Diagnosis present

## 2017-08-13 DIAGNOSIS — L89899 Pressure ulcer of other site, unspecified stage: Secondary | ICD-10-CM

## 2017-08-13 DIAGNOSIS — Z95820 Peripheral vascular angioplasty status with implants and grafts: Secondary | ICD-10-CM | POA: Diagnosis not present

## 2017-08-13 DIAGNOSIS — I96 Gangrene, not elsewhere classified: Secondary | ICD-10-CM | POA: Diagnosis present

## 2017-08-13 DIAGNOSIS — E871 Hypo-osmolality and hyponatremia: Secondary | ICD-10-CM | POA: Diagnosis present

## 2017-08-13 DIAGNOSIS — I998 Other disorder of circulatory system: Secondary | ICD-10-CM | POA: Diagnosis present

## 2017-08-13 DIAGNOSIS — W010XXA Fall on same level from slipping, tripping and stumbling without subsequent striking against object, initial encounter: Secondary | ICD-10-CM | POA: Diagnosis present

## 2017-08-13 DIAGNOSIS — L089 Local infection of the skin and subcutaneous tissue, unspecified: Secondary | ICD-10-CM | POA: Diagnosis present

## 2017-08-13 DIAGNOSIS — Z79899 Other long term (current) drug therapy: Secondary | ICD-10-CM | POA: Diagnosis not present

## 2017-08-13 DIAGNOSIS — T82856A Stenosis of peripheral vascular stent, initial encounter: Secondary | ICD-10-CM | POA: Diagnosis present

## 2017-08-13 DIAGNOSIS — J449 Chronic obstructive pulmonary disease, unspecified: Secondary | ICD-10-CM | POA: Diagnosis present

## 2017-08-13 DIAGNOSIS — Z7982 Long term (current) use of aspirin: Secondary | ICD-10-CM | POA: Diagnosis not present

## 2017-08-13 DIAGNOSIS — M86271 Subacute osteomyelitis, right ankle and foot: Secondary | ICD-10-CM | POA: Diagnosis present

## 2017-08-13 DIAGNOSIS — E785 Hyperlipidemia, unspecified: Secondary | ICD-10-CM | POA: Diagnosis present

## 2017-08-13 DIAGNOSIS — I10 Essential (primary) hypertension: Secondary | ICD-10-CM | POA: Diagnosis present

## 2017-08-13 DIAGNOSIS — Z89431 Acquired absence of right foot: Secondary | ICD-10-CM | POA: Diagnosis not present

## 2017-08-13 LAB — CBC
HCT: 34.8 % — ABNORMAL LOW (ref 39.0–52.0)
Hemoglobin: 11.4 g/dL — ABNORMAL LOW (ref 13.0–17.0)
MCH: 29.1 pg (ref 26.0–34.0)
MCHC: 32.8 g/dL (ref 30.0–36.0)
MCV: 88.8 fL (ref 78.0–100.0)
PLATELETS: 95 10*3/uL — AB (ref 150–400)
RBC: 3.92 MIL/uL — AB (ref 4.22–5.81)
RDW: 14.8 % (ref 11.5–15.5)
WBC: 13.2 10*3/uL — ABNORMAL HIGH (ref 4.0–10.5)

## 2017-08-13 MED ORDER — VANCOMYCIN HCL IN DEXTROSE 1-5 GM/200ML-% IV SOLN
1000.0000 mg | Freq: Two times a day (BID) | INTRAVENOUS | Status: DC
  Administered 2017-08-13 – 2017-08-21 (×18): 1000 mg via INTRAVENOUS
  Filled 2017-08-13 (×22): qty 200

## 2017-08-13 NOTE — Progress Notes (Addendum)
Pharmacy Antibiotic Note  Jorge Clements is a 72 y.o. male admitted on 08/11/2017 with cellulitis.  Pharmacy has been consulted for Vancomycin dosing.  Plan: Vancomycin 1000 mg IV every 12 hours.  Goal trough 10-15 mcg/mL.  Monitor labs, c/s, and vanco trough as indicated  Height:  (185.4 cm) Weight: 191 lb 12.8 oz (87 kg) IBW/kg (Calculated) : 79.9  Temp (24hrs), Avg:99.2 F (37.3 C), Min:98.6 F (37 C), Max:100.3 F (37.9 C)  Recent Labs  Lab 08/11/17 0221 08/11/17 0401 08/12/17 0406 08/13/17 0435  WBC 10.6*  --  11.0* 13.2*  CREATININE 1.03  --  0.90  --   LATICACIDVEN  --  1.46  --   --     Estimated Creatinine Clearance: 87.5 mL/min (by C-G formula based on SCr of 0.9 mg/dL).    Allergies  Allergen Reactions  . Penicillins Hives    Childhood reaction Has patient had a PCN reaction causing immediate rash, facial/tongue/throat swelling, SOB or lightheadedness with hypotension: No Has patient had a PCN reaction causing severe rash involving mucus membranes or skin necrosis: Yes Has patient had a PCN reaction that required hospitalization: Unknown Has patient had a PCN reaction occurring within the last 10 years: No If all of the above answers are "NO", then may proceed with Cephalosporin use.    Antimicrobials this admission: Vancomycin 4/27 >>  Clindamycin 4/25 >>   Dose adjustments this admission: N/A  Microbiology results:   4/25 MRSA PCR: negative  Thank you for allowing pharmacy to be a part of this patient's care.   Judeth Cornfield, PharmD Clinical Pharmacist 08/13/2017 9:12 AM

## 2017-08-13 NOTE — Progress Notes (Signed)
Called and left message with pts son regarding transfer to [facility]

## 2017-08-13 NOTE — Progress Notes (Signed)
Left via stretcher with [facility] at approx 2150. Spoke with exwife Bonita Quin and son regarding transport. Both verbalized understanding of need for transfer for evaluation of foot.   Report given to [place] on 5w.

## 2017-08-13 NOTE — Progress Notes (Signed)
Spoke with Heriberto Antigua, disposition coordinator at [facility]. States pt cannot have TTS consult until he is medically cleared and that once he is transferred to [facility] they will be able to see him. Dr. Gonzella Lex notified.

## 2017-08-13 NOTE — Progress Notes (Signed)
Pt well known to [facility] and has a hx of malingering.  Pt is not medically cleared and is transferring to [facility] for MRI and further work-up.  Disposition CSW requested that TTS consult be withdrawn.  Once patient is medically cleared at [facility] consult can be resubmitted if necessary.  Timmothy Euler. Kaylyn Lim, MSW, LCSWA Disposition Clinical Social Work [phone] (cell) [phone] (office)

## 2017-08-13 NOTE — Progress Notes (Signed)
PROGRESS NOTE                                                                                                                                                                                                             Patient Demographics:    Jorge Clements, is a 72 y.o. male, DOB - May 12, 1945, ONG:[id number]  Admit date - 08/11/2017   Admitting Physician Pratik Hoover Brunette, DO  Outpatient Primary MD for the patient is Patient, No Pcp Per  LOS - 0  Outpatient Specialists: none  Chief Complaint  Patient presents with  . Fever       Brief Narrative   72 y.o. male, with history of COPD, hypertension and peripheral vascular disease status post stenting of both legs presented with weakness that is ongoing for quite some time (in patient's words) but worse in the past 2-3 days.  He reports that he did not have enough strength to get up and ambulate.  On the evening of admission he fell on his right side and feels that he tripped on something and was very weak.  Denies hitting his head but did sustain some laceration in his right arm.  He also reports pain and swelling of his right foot and calf which has been ongoing for " quite some time" but more painful in the past 2 days.  Patient initially admitted for right leg cellulitis.  He continued to complain of pain in his bilateral legs and having difficulty ambulating.  On 4/26 after he had physical therapy done, nurse noted blood on the floor coming from his right foot with an ulcer on the plantar surface that broke up from a blood-filled blister. Overnight he had low-grade fever with leukocytosis this morning.   Subjective:   Still complains of pain in bilateral legs, more in the right.  Had low-grade fever yesterday with bleeding from the right plantar blister.  Assessment  & Plan :    Principal Problem: Right leg cellulitis and plantar ulcer Had bleeding from the right plantar  blister yesterday and is currently foul-smelling.  Had chronic skin changes on his right foot with a blister on his right second toe and the plantar aspect which he says has been present for several weeks. He now has an ulcer over the plantar surface with foul-smelling discharge.  No bleeding noted today. -  Patient will need an MRI of his right foot to rule out osteomyelitis and then be evaluated by either orthopedics or vascular surgery given his history of peripheral vascular disease with stenting.  I patient was on clindamycin since admission and I have broadened coverage by adding vancomycin. -Doppler right  leg negative for DVT. -Distal tibial pulses are palpable both manually and with bedside Doppler.  Generalized lower extremity weakness. Reports fall at home.  Head CT without acute findings.  Seen by PT and recommended SNF.  12 low (148) and given 1 dose of IM vitamin B12.  Needs monthly injections.  TSH normal.  Negative HIV antibody.  ?  Mood disorder on 4/26 Spoke with his ex-wife and she informs that patient has been living in her detached space in her house and does not have any ownership of that place.  Prior to 3 days back patient was ambulating without any issues, driving and doing groceries but then told her and his son that his bank account was messed up and he only had about $30 left since then he has been acting strange after he found out he did not have money in his bank account and started complaining of weakness in his legs and not able to ambulate. Wife and his son think that patient is suddenly acting strange after he found about losing his bank account and having tangential thoughts.  Telemetry psych consulted.  Active Problems:     COPD (chronic obstructive pulmonary disease) (HCC) Reports quit smoking and currently does vaping.  Counseled on cessation.  Asymptomatic.  B12 deficiency Received IM cyanocobalamin.  Check folate.    Thrombocytopenia (HCC) No clear  etiology.  Possibly related to acute infection.  Follow peripheral smear.  Hypokalemia Replenished.  Hyponatremia Secondary to dehydration.  Received IV fluids.  Holding HCTZ-ARB     Pressure injury of skin Blister over right foot.  Wound care consulted.    Elevated troponin Mild.  No EKG changes or chest pain symptoms.  Code Status : DNR  Family Communication  : Discussed with ex-wife on the phone  Disposition Plan  : Transfer to [facility] for MRI of the right foot followed by surgical consult.  Signed out to my partner Dr. Ashley Royalty.  Barriers For Discharge : Pending further work-up and transfer to [facility]  Consults  : Tele psych  Procedures  : CT head/Doppler right leg  DVT Prophylaxis  : SCDs  Lab Results  Component Value Date   PLT 95 (L) 08/13/2017    Antibiotics  :    Anti-infectives (From admission, onward)   Start     Dose/Rate Route Frequency Ordered Stop   08/13/17 1000  vancomycin (VANCOCIN) IVPB 1000 mg/200 mL premix     1,000 mg 200 mL/hr over 60 Minutes Intravenous Every 12 hours 08/13/17 0911     08/11/17 1000  clindamycin (CLEOCIN) IVPB 600 mg     600 mg 100 mL/hr over 30 Minutes Intravenous Every 8 hours 08/11/17 0853          Objective:   Vitals:   08/12/17 2028 08/12/17 2209 08/12/17 2300 08/13/17 0500  BP:  (!) 88/42 (!) 89/45 (!) 104/45  Pulse:  82  74  Resp:  20  20  Temp: 100.3 F (37.9 C) 98.7 F (37.1 C)  99.1 F (37.3 C)  TempSrc: Oral Oral  Oral  SpO2:  97%  100%  Weight:      Height:  Wt Readings from Last 3 Encounters:  08/11/17 87 kg (191 lb 12.8 oz)  11/23/13 103.4 kg (228 lb)  11/17/13 103.4 kg (228 lb)     Intake/Output Summary (Last 24 hours) at 08/13/2017 1046 Last data filed at 08/13/2017 0517 Gross per 24 hour  Intake 150 ml  Output -  Net 150 ml     Physical Exam General: Not in distress, fatigue HEENT: Moist mucosa, supple neck Chest: Clear bilaterally, CVS: Normal S1-S2, no  murmurs GI: Soft, nondistended, nontender Musculoskeletal: Tonic skin changes over her right foot with some swelling, bluish discoloration between great toe and second toe, with an ulcer measuring 1 x 1 cm with bluish discoloration surrounding it and with a foul smell, no bleeding.  Distal pulses palpable.   Data Review:    CBC Recent Labs  Lab 08/11/17 0221 08/12/17 0406 08/13/17 0435  WBC 10.6* 11.0* 13.2*  HGB 12.8* 12.7* 11.4*  HCT 37.6* 37.7* 34.8*  PLT 76* 94* 95*  MCV 87.9 89.5 88.8  MCH 29.9 30.2 29.1  MCHC 34.0 33.7 32.8  RDW 14.3 14.7 14.8    Chemistries  Recent Labs  Lab 08/11/17 0221 08/12/17 0406  NA 132* 132*  K 3.4* 3.6  CL 99* 100*  CO2 22 18*  GLUCOSE 130* 96  BUN 14 15  CREATININE 1.03 0.90  CALCIUM 8.5* 8.3*   ------------------------------------------------------------------------------------------------------------------ No results for input(s): CHOL, HDL, LDLCALC, TRIG, CHOLHDL, LDLDIRECT in the last 72 hours.  No results found for: HGBA1C ------------------------------------------------------------------------------------------------------------------ Recent Labs    08/11/17 1046  TSH 1.667   ------------------------------------------------------------------------------------------------------------------ Recent Labs    08/11/17 1046  VITAMINB12 148*    Coagulation profile No results for input(s): INR, PROTIME in the last 168 hours.  No results for input(s): DDIMER in the last 72 hours.  Cardiac Enzymes Recent Labs  Lab 08/11/17 0923 08/11/17 1513 08/11/17 2029  TROPONINI 0.03* 0.03* <0.03   ------------------------------------------------------------------------------------------------------------------ No results found for: BNP  Inpatient Medications  Scheduled Meds: Continuous Infusions: . clindamycin (CLEOCIN) IV Stopped (08/13/17 0547)  . vancomycin 1,000 mg (08/13/17 1009)   PRN Meds:.acetaminophen **OR**  acetaminophen, ALPRAZolam, ondansetron **OR** ondansetron (ZOFRAN) IV, traMADol-acetaminophen  Micro Results Recent Results (from the past 240 hour(s))  MRSA PCR Screening     Status: None   Collection Time: 08/11/17  9:10 AM  Result Value Ref Range Status   MRSA by PCR NEGATIVE NEGATIVE Final    Comment:        The GeneXpert MRSA Assay (FDA approved for NASAL specimens only), is one component of a comprehensive MRSA colonization surveillance program. It is not intended to diagnose MRSA infection nor to guide or monitor treatment for MRSA infections. Performed at [facility], [address], [place], [place] [address]     Radiology Reports Dg Chest 2 View  Result Date: 08/11/2017 CLINICAL DATA:  Fever and weakness, chills for 3 days. History of COPD. EXAM: CHEST - 2 VIEW COMPARISON:  None. FINDINGS: Cardiomediastinal silhouette is normal. Calcified aortic arch. No pleural effusions or focal consolidations. Mild hyperinflation. Trachea projects midline and there is no pneumothorax. Soft tissue planes and included osseous structures are non-suspicious. Mild degenerative change of thoracic spine. IMPRESSION: Mild hyperinflation without focal consolidation. Aortic Atherosclerosis (ICD10-I70.0). Electronically Signed   By: Awilda Metro M.D.   On: 08/11/2017 04:00   Ct Head Wo Contrast  Result Date: 08/11/2017 CLINICAL DATA:  Syncope.  COPD. EXAM: CT HEAD WITHOUT CONTRAST TECHNIQUE: Contiguous axial images were obtained from the base of  the skull through the vertex without intravenous contrast. COMPARISON:  None. FINDINGS: Brain: No evidence of acute infarction, hemorrhage, extra-axial collection, ventriculomegaly, or mass effect. Generalized cerebral atrophy. Periventricular white matter low attenuation likely secondary to microangiopathy. Vascular: Cerebrovascular atherosclerotic calcifications are noted. Skull: Negative for fracture or focal lesion. Sinuses/Orbits: Visualized portions  of the orbits are unremarkable. Visualized portions of the paranasal sinuses and mastoid air cells are unremarkable. Other: None. IMPRESSION: No acute intracranial pathology. Electronically Signed   By: Elige Ko   On: 08/11/2017 09:03   US Venous Img Lower Unilateral Right  Result Date: 08/11/2017 CLINICAL DATA:  72 year old male with right lower extremity pain and swelling from the knee to the foot. EXAM: RIGHT LOWER EXTREMITY VENOUS DOPPLER ULTRASOUND TECHNIQUE: Gray-scale sonography with graded compression, as well as color Doppler and duplex ultrasound were performed to evaluate the lower extremity deep venous systems from the level of the common femoral vein and including the common femoral, femoral, profunda femoral, popliteal and calf veins including the posterior tibial, peroneal and gastrocnemius veins when visible. The superficial great saphenous vein was also interrogated. Spectral Doppler was utilized to evaluate flow at rest and with distal augmentation maneuvers in the common femoral, femoral and popliteal veins. COMPARISON:  None. FINDINGS: Contralateral Common Femoral Vein: Respiratory phasicity is normal and symmetric with the symptomatic side. No evidence of thrombus. Normal compressibility. Common Femoral Vein: No evidence of thrombus. Normal compressibility, respiratory phasicity and response to augmentation. Saphenofemoral Junction: No evidence of thrombus. Normal compressibility and flow on color Doppler imaging. Profunda Femoral Vein: No evidence of thrombus. Normal compressibility and flow on color Doppler imaging. Femoral Vein: No evidence of thrombus. Normal compressibility, respiratory phasicity and response to augmentation. Popliteal Vein: No evidence of thrombus. Normal compressibility, respiratory phasicity and response to augmentation. Calf Veins: No evidence of thrombus. Normal compressibility and flow on color Doppler imaging. Superficial Great Saphenous Vein: No evidence of  thrombus. Normal compressibility. Venous Reflux:  None. Other Findings:  None. IMPRESSION: No evidence of deep venous thrombosis. Electronically Signed   By: Malachy Moan M.D.   On: 08/11/2017 12:35    Time Spent in minutes  25   Aniceto Kyser M.D on 08/13/2017 at 10:46 AM  Between 7am to 7pm - Pager - [phone]  After 7pm go to [email or web address] - password [facility]  [facility] -  Office  [phone]

## 2017-08-13 NOTE — Progress Notes (Signed)
Bedside doppler of r. foot performed. Good pulses noted at tibia and top foot. Locations marked with sharpie. Dr. Gonzella Lex notified.

## 2017-08-14 ENCOUNTER — Inpatient Hospital Stay (HOSPITAL_COMMUNITY): Payer: Medicare Other

## 2017-08-14 ENCOUNTER — Encounter (HOSPITAL_COMMUNITY): Payer: Self-pay | Admitting: Radiology

## 2017-08-14 MED ORDER — GADOBENATE DIMEGLUMINE 529 MG/ML IV SOLN
20.0000 mL | Freq: Once | INTRAVENOUS | Status: AC | PRN
  Administered 2017-08-14: 17 mL via INTRAVENOUS

## 2017-08-14 MED ORDER — ROSUVASTATIN CALCIUM 10 MG PO TABS
10.0000 mg | ORAL_TABLET | Freq: Every day | ORAL | Status: DC
  Administered 2017-08-14 – 2017-08-22 (×9): 10 mg via ORAL
  Filled 2017-08-14 (×9): qty 1

## 2017-08-14 NOTE — Consult Note (Signed)
WOC Nurse wound consult note Reason for Consult: full thickness wound on plantar aspect of right foot at 3rd metatarsal head. Purple/black discoloration from tip of toe to this wound measuring 3cm x 2cm. 2nd digit is draining purulent drainage in a moderate amount and presents with a strong odor consistent with infection and perhaps tissue necrosis. The right foot is edematous, warm. Wound type: Infectious Pressure Injury POA: NA Measurement: As described above Wound bed:As described above Drainage (amount, consistency, odor) As described above Periwound: As described above Dressing procedure/placement/frequency: I have implemented a conservative POC using saline to cleanse this area and then placing dry dressings over the affected areas twice daily. Recommend evaluation by Orthopedics to see if transmetatarsal amputation is required or if healing is likely with proper wound care, antibiotics and off loading. If you agree, please order/consult.  WOC nursing team will not follow, but will remain available to this patient, the nursing and medical teams.  Please re-consult if needed. Thanks, Ladona Mow, MSN, RN, GNP, [facility] [place]  Pager# [phone]

## 2017-08-14 NOTE — Progress Notes (Signed)
Paged Dr Ashley Royalty regarding pt wishing to change his code status from DNR to a Full Code Status.

## 2017-08-14 NOTE — Progress Notes (Signed)
PROGRESS NOTE    Jorge Clements  ZOX:[id number] DOB: 1945/10/12 DOA: 08/11/2017 PCP: Patient, No Pcp Per  Brief Narrative: 72 y.o.male,with history of COPD, hypertension and peripheral vascular disease status post stenting of both legs presented with weakness that is ongoing for quite some time (in patient's words) but worse in the past 2-3 days. He reports that he did not have enough strength to get up and ambulate. On the evening of admission he fell on his right side and feels that he tripped on something and was very weak. Denies hitting his head but did sustain some laceration in his right arm. He also reports pain and swelling of his right foot and calf which has been ongoing for "quite some time"but more painful in the past 2 days.  Patient initially admitted for right leg cellulitis.  He continued to complain of pain in his bilateral legs and having difficulty ambulating.  On 4/26 after he had physical therapy done, nurse noted blood on the floor coming from his right foot with an ulcer on the plantar surface that broke up from a blood-filled blister. Overnight he had low-grade fever with leukocytosis    Assessment & Plan:   Principal Problem:   Weakness Active Problems:   COPD (chronic obstructive pulmonary disease) (HCC)   Cellulitis of right leg   Fall at home, initial encounter   Thrombocytopenia (HCC)   Weakness generalized   Hypokalemia   Essential hypertension   Pressure injury of skin   Infected blister of foot  Right leg cellulitis and plantar ulcer Had bleeding from the right plantar blister yesterday and is currently foul-smelling.  Had chronic skin changes on his right foot with a blister on his right second toe and the plantar aspect which he says has been present for several weeks. He now has an ulcer over the plantar surface with foul-smelling discharge.  No bleeding noted today.  MRI of the right foot with and without contrast ordered.  Continue vancomycin  and clindamycin for now.  Doppler of the right leg negative for DVT.  Distal pulses palpable tibial.  Generalized lower extremity weakness. Reports fall at home.  Head CT without acute findings.  Seen by PT and recommended SNF. b 12 low (148) and given 1 dose of IM vitamin B12.  Needs monthly injections.  TSH normal.  Negative HIV antibody.  ?  Mood disorder on 4/26 Spoke with his ex-wife and she informs that patient has been living in her detached space in her house and does not have any ownership of that place.  Prior to 3 days back patient was ambulating without any issues, driving and doing groceries but then told her and his son that his bank account was messed up and he only had about $30 left since then he has been acting strange after he found out he did not have money in his bank account and started complaining of weakness in his legs and not able to ambulate. Wife and his son think that patient is suddenly acting strange after he found about losing his bank account and having tangential thoughts.  Telemetry psych was consulted recommended patient has been medically stable before they can see him.  COPD (chronic obstructive pulmonary disease) (HCC) Reports quit smoking and currently does vaping.  Counseled on cessation.  Asymptomatic.  B12 deficiency Received IM cyanocobalamin.  Check folate.    Thrombocytopenia (HCC) No clear etiology.  Possibly related to acute infection.  Follow peripheral smear.  Hypokalemia Replenished.  Hyponatremia  Secondary to dehydration.  Received IV fluids.  Holding HCTZ-ARB     Pressure injury of skin Blister over right foot.  Wound care consulted.    Elevated troponin Mild.  No EKG changes or chest pain symptoms.  Hyperlipidemia restart Crestor.       DVT prophylaxis: SCD Code Status: DO NOT RESUSCITATE Family Communication: No family available Disposition Plan TBD Consultants: None yet  Procedures: None Antimicrobials:  Vancomycin and clindamycin  Subjective: Complains of right leg pain   Objective: foul-smelling drainage from the right foot Vitals:   08/13/17 2000 08/13/17 2029 08/13/17 2229 08/14/17 0529  BP:  (!) 112/46 (!) 106/55 119/62  Pulse:   69 63  Resp:   (!) 24 16  Temp: 97.8 F (36.6 C)  98.3 F (36.8 C) 98.6 F (37 C)  TempSrc: Oral     SpO2:   97% 100%  Weight:      Height:        Intake/Output Summary (Last 24 hours) at 08/14/2017 1209 Last data filed at 08/14/2017 0554 Gross per 24 hour  Intake 470 ml  Output 276 ml  Net 194 ml   Filed Weights   08/11/17 0127 08/11/17 0906  Weight: 86.2 kg (190 lb) 87 kg (191 lb 12.8 oz)    Examination:  General exam: Appears calm and comfortable  Respiratory system: Clear to auscultation. Respiratory effort normal. Cardiovascular system: S1 & S2 heard, RRR. No JVD, murmurs, rubs, gallops or clicks. No pedal edema. Gastrointestinal system: Abdomen is nondistended, soft and nontender. No organomegaly or masses felt. Normal bowel sounds heard. Central nervous system: Alert and oriented. No focal neurological deficits. Extremities: Right foot covered with dressing Skin: No rashes, lesions or ulcers Psychiatry: Judgement and insight appear normal. Mood & affect appropriate.     Data Reviewed: I have personally reviewed following labs and imaging studies  CBC: Recent Labs  Lab 08/11/17 0221 08/12/17 0406 08/13/17 0435  WBC 10.6* 11.0* 13.2*  HGB 12.8* 12.7* 11.4*  HCT 37.6* 37.7* 34.8*  MCV 87.9 89.5 88.8  PLT 76* 94* 95*   Basic Metabolic Panel: Recent Labs  Lab 08/11/17 0221 08/12/17 0406  NA 132* 132*  K 3.4* 3.6  CL 99* 100*  CO2 22 18*  GLUCOSE 130* 96  BUN 14 15  CREATININE 1.03 0.90  CALCIUM 8.5* 8.3*   GFR: Estimated Creatinine Clearance: 87.5 mL/min (by C-G formula based on SCr of 0.9 mg/dL). Liver Function Tests: No results for input(s): AST, ALT, ALKPHOS, BILITOT, PROT, ALBUMIN in the last 168  hours. No results for input(s): LIPASE, AMYLASE in the last 168 hours. No results for input(s): AMMONIA in the last 168 hours. Coagulation Profile: No results for input(s): INR, PROTIME in the last 168 hours. Cardiac Enzymes: Recent Labs  Lab 08/11/17 0221 08/11/17 0923 08/11/17 1513 08/11/17 2029  TROPONINI 0.03* 0.03* 0.03* <0.03   BNP (last 3 results) No results for input(s): PROBNP in the last 8760 hours. HbA1C: No results for input(s): HGBA1C in the last 72 hours. CBG: No results for input(s): GLUCAP in the last 168 hours. Lipid Profile: No results for input(s): CHOL, HDL, LDLCALC, TRIG, CHOLHDL, LDLDIRECT in the last 72 hours. Thyroid Function Tests: No results for input(s): TSH, T4TOTAL, FREET4, T3FREE, THYROIDAB in the last 72 hours. Anemia Panel: No results for input(s): VITAMINB12, FOLATE, FERRITIN, TIBC, IRON, RETICCTPCT in the last 72 hours. Sepsis Labs: Recent Labs  Lab 08/11/17 0401  LATICACIDVEN 1.46    Recent Results (from the past  240 hour(s))  MRSA PCR Screening     Status: None   Collection Time: 08/11/17  9:10 AM  Result Value Ref Range Status   MRSA by PCR NEGATIVE NEGATIVE Final    Comment:        The GeneXpert MRSA Assay (FDA approved for NASAL specimens only), is one component of a comprehensive MRSA colonization surveillance program. It is not intended to diagnose MRSA infection nor to guide or monitor treatment for MRSA infections. Performed at [facility], [address] [place], [place] [address]          Radiology Studies: Mr Foot Right W Wo Contrast  Result Date: 08/14/2017 CLINICAL DATA:  Pain and swelling in the forefoot with difficult ambulation. Plantar foot ulcer noted. Low-grade fever. EXAM: MRI OF THE RIGHT FOREFOOT WITHOUT AND WITH CONTRAST TECHNIQUE: Multiplanar, multisequence MR imaging of the right foot was performed before and after the administration of intravenous contrast. CONTRAST:  17mL MULTIHANCE GADOBENATE  DIMEGLUMINE 529 MG/ML IV SOLN COMPARISON:  None. FINDINGS: There is an open wound on the plantar aspect of the forefoot overlying the region of the second MTP joint. There is an associated communicating large dissecting abscess involving the plantar soft tissues and than dissecting between the second and third metacarpal heads and into the dorsum of the foot. This abscess measures approximately 4.9 x 4.2 x 2.1 cm. A small amount of gas is noted the abscess. There is associated severe cellulitis and moderate myofasciitis but I do not see any definite MR findings for septic arthritis or osteomyelitis. IMPRESSION: 1. Open wound on the plantar aspect of the forefoot communicating with a large dissecting abscess as detailed above. 2. Associated severe cellulitis and moderate myofasciitis. 3. No definite MR findings for septic arthritis or osteomyelitis. Electronically Signed   By: Rudie Meyer M.D.   On: 08/14/2017 11:12        Scheduled Meds: Continuous Infusions: . clindamycin (CLEOCIN) IV Stopped (08/14/17 0554)  . vancomycin Stopped (08/14/17 0048)     LOS: 1 day     Alwyn Ren, MD [facility] If 7PM-7AM, please contact night-coverage [email or web address] Password [facility] 08/14/2017, 12:09 PM

## 2017-08-15 DIAGNOSIS — L03115 Cellulitis of right lower limb: Secondary | ICD-10-CM

## 2017-08-15 DIAGNOSIS — M86271 Subacute osteomyelitis, right ankle and foot: Secondary | ICD-10-CM

## 2017-08-15 LAB — COMPREHENSIVE METABOLIC PANEL
ALBUMIN: 2.1 g/dL — AB (ref 3.5–5.0)
ALT: 119 U/L — ABNORMAL HIGH (ref 17–63)
ANION GAP: 8 (ref 5–15)
AST: 139 U/L — ABNORMAL HIGH (ref 15–41)
Alkaline Phosphatase: 129 U/L — ABNORMAL HIGH (ref 38–126)
BUN: 12 mg/dL (ref 6–20)
CO2: 28 mmol/L (ref 22–32)
Calcium: 8 mg/dL — ABNORMAL LOW (ref 8.9–10.3)
Chloride: 98 mmol/L — ABNORMAL LOW (ref 101–111)
Creatinine, Ser: 0.79 mg/dL (ref 0.61–1.24)
GFR calc non Af Amer: >60 mL/min (ref >60)
GLUCOSE: 121 mg/dL — AB (ref 65–99)
POTASSIUM: 3.5 mmol/L (ref 3.5–5.1)
SODIUM: 134 mmol/L — AB (ref 135–145)
TOTAL PROTEIN: 5.6 g/dL — AB (ref 6.5–8.1)
Total Bilirubin: 0.6 mg/dL (ref 0.3–1.2)

## 2017-08-15 LAB — CBC WITH DIFFERENTIAL/PLATELET
BASOS PCT: 0 %
Basophils Absolute: 0 10*3/uL (ref 0.0–0.1)
EOS ABS: 0.2 10*3/uL (ref 0.0–0.7)
Eosinophils Relative: 3 %
HCT: 30 % — ABNORMAL LOW (ref 39.0–52.0)
Hemoglobin: 10 g/dL — ABNORMAL LOW (ref 13.0–17.0)
Lymphocytes Relative: 20 %
Lymphs Abs: 1.4 10*3/uL (ref 0.7–4.0)
MCH: 29.3 pg (ref 26.0–34.0)
MCHC: 33.3 g/dL (ref 30.0–36.0)
MCV: 88 fL (ref 78.0–100.0)
MONO ABS: 0.6 10*3/uL (ref 0.1–1.0)
MONOS PCT: 9 %
Neutro Abs: 4.9 10*3/uL (ref 1.7–7.7)
Neutrophils Relative %: 68 %
Platelets: 123 10*3/uL — ABNORMAL LOW (ref 150–400)
RBC: 3.41 MIL/uL — ABNORMAL LOW (ref 4.22–5.81)
RDW: 15 % (ref 11.5–15.5)
WBC: 7.1 10*3/uL (ref 4.0–10.5)

## 2017-08-15 LAB — MAGNESIUM: Magnesium: 1.8 mg/dL (ref 1.7–2.4)

## 2017-08-15 LAB — FOLATE RBC
FOLATE, HEMOLYSATE: 615.8 ng/mL
Folate, RBC: 1877 ng/mL (ref >498)
Hematocrit: 32.8 % — ABNORMAL LOW (ref 37.5–51.0)

## 2017-08-15 NOTE — [place] FL2 (Signed)
[place] MEDICAID FL2 LEVEL OF CARE SCREENING TOOL     IDENTIFICATION  Patient Name: Jorge Clements Birthdate: 1945/10/20 Sex: male Admission Date (Current Location): 08/11/2017  [facility] and [place] Number:  [employer] and Address:  [facility], [address] [place], [place] [address]      Provider Number: [id number]  Attending Physician Name and Address:  Alwyn Ren, MD  Relative Name and Phone Number:  Alphonzo Lemmings, son, [phone]    Current Level of Care: Hospital Recommended Level of Care: Skilled Nursing Facility Prior Approval Number:    Date Approved/Denied:   PASRR Number: [id number]  Discharge Plan: SNF    Current Diagnoses: Patient Active Problem List   Diagnosis Date Noted  . Infected blister of foot 08/13/2017  . Pressure injury of skin 08/12/2017  . Weakness 08/11/2017  . COPD (chronic obstructive pulmonary disease) (HCC) 08/11/2017  . Cellulitis of right leg 08/11/2017  . Fall at home, initial encounter 08/11/2017  . Thrombocytopenia (HCC) 08/11/2017  . Weakness generalized 08/11/2017  . Hypokalemia 08/11/2017  . Essential hypertension 08/11/2017    Orientation RESPIRATION BLADDER Height & Weight     Self, Time, Situation, Place  Normal Incontinent, External catheter Weight: 87 kg (191 lb 12.8 oz) Height:   (185.4 cm)  BEHAVIORAL SYMPTOMS/MOOD NEUROLOGICAL BOWEL NUTRITION STATUS      Continent Diet(Please see DC Summary)  AMBULATORY STATUS COMMUNICATION OF NEEDS Skin   Extensive Assist Verbally PU Stage and Appropriate Care(Deep tissue injury on foot; Venous stasis injury on leg)                       Personal Care Assistance Level of Assistance  Bathing, Feeding, Dressing Bathing Assistance: Maximum assistance Feeding assistance: Independent Dressing Assistance: Limited assistance     Functional Limitations Info  Hearing   Hearing Info: Impaired      SPECIAL CARE FACTORS  FREQUENCY  PT (By licensed PT), OT (By licensed OT)     PT Frequency: 5x/week OT Frequency: 3x/week            Contractures      Additional Factors Info  Code Status, Allergies Code Status Info: Full Allergies Info: Penicillins           Current Medications (08/15/2017):  This is the current hospital active medication list Current Facility-Administered Medications  Medication Dose Route Frequency Provider Last Rate Last Dose  . acetaminophen (TYLENOL) tablet 650 mg  650 mg Oral Q6H PRN Dhungel, Nishant, MD   650 mg at 08/12/17 2129   Or  . acetaminophen (TYLENOL) suppository 650 mg  650 mg Rectal Q6H PRN Dhungel, Nishant, MD      . ALPRAZolam Prudy Feeler) tablet 0.25 mg  0.25 mg Oral BID PRN Dhungel, Nishant, MD   0.25 mg at 08/14/17 2249  . clindamycin (CLEOCIN) IVPB 600 mg  600 mg Intravenous Q8H Dhungel, Nishant, MD   Stopped at 08/15/17 0538  . ondansetron (ZOFRAN) tablet 4 mg  4 mg Oral Q6H PRN Dhungel, Nishant, MD       Or  . ondansetron (ZOFRAN) injection 4 mg  4 mg Intravenous Q6H PRN Dhungel, Nishant, MD      . rosuvastatin (CRESTOR) tablet 10 mg  10 mg Oral Daily Alwyn Ren, MD   10 mg at 08/14/17 1347  . traMADol-acetaminophen (ULTRACET) 37.5-325 MG per tablet 2 tablet  2 tablet Oral Q6H PRN Dhungel, Nishant, MD   2 tablet at  08/14/17 2249  . vancomycin (VANCOCIN) IVPB 1000 mg/200 mL premix  1,000 mg Intravenous Q12H Dhungel, Theda Belfast, MD   Stopped at 08/14/17 2319     Discharge Medications: Please see discharge summary for a list of discharge medications.  Relevant Imaging Results:  Relevant Lab Results:   Additional Information SSN: [id number] [place], [place]

## 2017-08-15 NOTE — Consult Note (Signed)
ORTHOPAEDIC CONSULTATION  REQUESTING PHYSICIAN: Alwyn Ren, MD  Chief Complaint: One year history of ulceration right foot.  HPI: Jorge Clements is a 72 y.o. male who presents with chronic ulcer plantar aspect right foot with cellulitis odor and drainage.  Patient states that he has had multiple vascular procedures performed in the past several years he states he was told he only had 50% circulation to his feet and he underwent 2 stents for the right lower extremity one stent for the left lower extremity.  Patient is unsure where these procedures were performed.  Past Medical History:  Diagnosis Date  . COPD (chronic obstructive pulmonary disease) (HCC)    History reviewed. No pertinent surgical history. Social History   Socioeconomic History  . Marital status: Divorced    Spouse name: Not on file  . Number of children: Not on file  . Years of education: Not on file  . Highest education level: Not on file  Occupational History  . Not on file  Social Needs  . Financial resource strain: Not on file  . Food insecurity:    Worry: Not on file    Inability: Not on file  . Transportation needs:    Medical: Not on file    Non-medical: Not on file  Tobacco Use  . Smoking status: Former Games developer  . Smokeless tobacco: Never Used  Substance and Sexual Activity  . Alcohol use: Not Currently  . Drug use: No  . Sexual activity: Not on file  Lifestyle  . Physical activity:    Days per week: Not on file    Minutes per session: Not on file  . Stress: Not on file  Relationships  . Social connections:    Talks on phone: Not on file    Gets together: Not on file    Attends religious service: Not on file    Active member of club or organization: Not on file    Attends meetings of clubs or organizations: Not on file    Relationship status: Not on file  Other Topics Concern  . Not on file  Social History Narrative  . Not on file   History reviewed. No pertinent family  history. - negative except otherwise stated in the family history section Allergies  Allergen Reactions  . Penicillins Hives    Childhood reaction Has patient had a PCN reaction causing immediate rash, facial/tongue/throat swelling, SOB or lightheadedness with hypotension: No Has patient had a PCN reaction causing severe rash involving mucus membranes or skin necrosis: Yes Has patient had a PCN reaction that required hospitalization: Unknown Has patient had a PCN reaction occurring within the last 10 years: No If all of the above answers are "NO", then may proceed with Cephalosporin use.   Prior to Admission medications   Medication Sig Start Date End Date Taking? Authorizing Provider  aspirin 325 MG tablet Take 325 mg by mouth daily. As a buffered tablet antiplatelet   Yes [provider]  rosuvastatin (CRESTOR) 40 MG tablet Take by mouth. 03/19/17  Yes [provider]  lisinopril-hydrochlorothiazide (PRINZIDE) 20-12.5 MG per tablet Take 1 tablet by mouth daily. Patient not taking: Reported on 03/10/2017 11/23/13   Pricilla Loveless, MD  hydrochlorothiazide (HYDRODIURIL) 25 MG tablet Take 0.5 tablets (12.5 mg total) by mouth daily. 11/17/13 11/23/13  Elpidio Anis, PA-C   Mr Foot Right W Wo Contrast  Result Date: 08/14/2017 CLINICAL DATA:  Pain and swelling in the forefoot with difficult ambulation. Plantar foot ulcer  noted. Low-grade fever. EXAM: MRI OF THE RIGHT FOREFOOT WITHOUT AND WITH CONTRAST TECHNIQUE: Multiplanar, multisequence MR imaging of the right foot was performed before and after the administration of intravenous contrast. CONTRAST:  17mL MULTIHANCE GADOBENATE DIMEGLUMINE 529 MG/ML IV SOLN COMPARISON:  None. FINDINGS: There is an open wound on the plantar aspect of the forefoot overlying the region of the second MTP joint. There is an associated communicating large dissecting abscess involving the plantar soft tissues and than dissecting between the second and third  metacarpal heads and into the dorsum of the foot. This abscess measures approximately 4.9 x 4.2 x 2.1 cm. A small amount of gas is noted the abscess. There is associated severe cellulitis and moderate myofasciitis but I do not see any definite MR findings for septic arthritis or osteomyelitis. IMPRESSION: 1. Open wound on the plantar aspect of the forefoot communicating with a large dissecting abscess as detailed above. 2. Associated severe cellulitis and moderate myofasciitis. 3. No definite MR findings for septic arthritis or osteomyelitis. Electronically Signed   By: Rudie Meyer M.D.   On: 08/14/2017 11:12   - pertinent xrays, CT, MRI studies were reviewed and independently interpreted  Positive ROS: All other systems have been reviewed and were otherwise negative with the exception of those mentioned in the HPI and as above.  Physical Exam: General: Alert, no acute distress Psychiatric: Patient is competent for consent with normal mood and affect Lymphatic: No axillary or cervical lymphadenopathy Cardiovascular: No pedal edema Respiratory: No cyanosis, no use of accessory musculature GI: No organomegaly, abdomen is soft and non-tender    Images:  @  Labs:  No results found for: HGBA1C, ESRSEDRATE, CRP, LABURIC, REPTSTATUS, GRAMSTAIN, CULT, LABORGA   Neurologic: Patient does not have protective sensation bilateral lower extremities.   MUSCULOSKELETAL:   Skin: Examination patient has an ulcer which probes to bone on the plantar aspect of the second and third metatarsal heads.  The ulcer is open and decompressed.  Dorsally patient has thin atrophic skin with venous stasis changes in the leg.  Patient does not have a palpable dorsalis pedis pulse.  Review of the MRI scan shows an abscess on the forefoot.  Assessment: Assessment: Peripheral vascular disease right lower extremity with abscess and early osteomyelitis of the forefoot.  Plan: Plan: Would have patient  evaluated by vascular vein surgery or if established with cardiology for vascular evaluation to the right lower extremity.  I am unsure the patient would have sufficient circulation for foot salvage intervention at this time.  Ankle-brachial indices are ordered.  Thank you for the consult and the opportunity to see Mr. Jorge Chihuahua, MD [facility] [phone] 6:40 PM

## 2017-08-15 NOTE — Progress Notes (Signed)
CSW notes assessment completed at previous hospital before transfer. CSW spoke with patient regarding discharge plan. Since patient has changed to inpatient status, he qualifies for SNF placement. So far only offer near patient's home is [facility]. Patient is in agreement to discharge there. CSW continuing to follow.  Osborne Casco Elier Zellars LCSW [phone]

## 2017-08-15 NOTE — Progress Notes (Signed)
Physical Therapy Treatment Patient Details Name: Jorge Clements MRN: [id number] DOB: 10/10/1945 Today's Date: 08/15/2017    History of Present Illness Jorge Clements  is a 72 y.o. male, with history of COPD, hypertension and peripheral vascular disease status post stenting of both legs presented with weakness that is ongoing for quite some time (in patient's words) but worse in the past 2-3 days.  He reports that he did not have enough strength to get up and ambulate.  On the evening of admission he fell on his right side and feels that he tripped on something and was very weak.  Denies hitting his head but did sustain some laceration in his right arm.  He also reports pain and swelling of his right foot and calf which has been ongoing for " quite some time" but more painful in the past 2 days.    PT Comments    Pt with complaints of stomach pain and pressure on entry, required max verbal encouragement to get to [facility]. Pt currently modA for bed mobility and transfers and minA for 3 feet ambulation with RW to [facility]. Pt with breakthrough drainage with weightbearing on L LE, nursing notified. Pt returned to bed for IV placement. Pt discharge plans remain appropriate at this time. PT will continue to follow acutely.    Follow Up Recommendations  SNF;Supervision/Assistance - 24 hour     Equipment Recommendations  None recommended by PT    Recommendations for Other Services       Precautions / Restrictions Precautions Precautions: Fall Restrictions Weight Bearing Restrictions: No    Mobility  Bed Mobility Overal bed mobility: Needs Assistance Bed Mobility: Supine to Sit     Supine to sit: Mod assist;HOB elevated     General bed mobility comments: requires modA to bring trunk to upright and LE off and on bed  Transfers Overall transfer level: Needs assistance Equipment used: Rolling walker (2 wheeled) Transfers: Sit to/from [employer] Sit to Stand: Mod assist          General transfer comment: Mod A for power up from elevated surface  Ambulation/Gait Ambulation/Gait assistance: Min assist Ambulation Distance (Feet): 3 Feet Assistive device: Rolling walker (2 wheeled) Gait Pattern/deviations: Decreased step length - right;Decreased step length - left;Shuffle Gait velocity: decreased Gait velocity interpretation: <1.31 ft/sec, indicative of household ambulator General Gait Details: short shuffling steps to and from BSC,vc for upright posture and RW management     Balance Overall balance assessment: Needs assistance Sitting-balance support: Feet supported;No upper extremity supported Sitting balance-Leahy Scale: Good     Standing balance support: Bilateral upper extremity supported;During functional activity Standing balance-Leahy Scale: Fair                              Cognition Arousal/Alertness: Awake/alert Behavior During Therapy: WFL for tasks assessed/performed Overall Cognitive Status: Within Functional Limits for tasks assessed                                               Pertinent Vitals/Pain Pain Assessment: Faces Faces Pain Scale: Hurts even more Pain Location: stomach Pain Descriptors / Indicators: Aching;Discomfort;Guarding Pain Intervention(s): Limited activity within patient's tolerance;Monitored during session;Repositioned           PT Goals (current goals can now be found in the care plan section)  Acute Rehab PT Goals Patient Stated Goal: return to his guest house  PT Goal Formulation: With patient Time For Goal Achievement: 08/18/17 Potential to Achieve Goals: Good Progress towards PT goals: Not progressing toward goals - comment(limited by stomach cramps )    Frequency    Min 4X/week      PT Plan Current plan remains appropriate       AM-PAC PT "6 Clicks" Daily Activity  Outcome Measure  Difficulty turning over in bed (including adjusting bedclothes, sheets and  blankets)?: A Lot Difficulty moving from lying on back to sitting on the side of the bed? : Unable Difficulty sitting down on and standing up from a chair with arms (e.g., wheelchair, bedside commode, etc,.)?: Unable Help needed moving to and from a bed to chair (including a wheelchair)?: A Lot Help needed walking in hospital room?: A Lot Help needed climbing 3-5 steps with a railing? : Total 6 Click Score: 9    End of Session Equipment Utilized During Treatment: Gait belt Activity Tolerance: Patient tolerated treatment well;Patient limited by fatigue Patient left: with call bell/phone within reach;in bed;with nursing/sitter in room Nurse Communication: Mobility status PT Visit Diagnosis: Unsteadiness on feet (R26.81);Other abnormalities of gait and mobility (R26.89);Muscle weakness (generalized) (M62.81)     Time: 1610-9604 PT Time Calculation (min) (ACUTE ONLY): 14 min  Charges:  $Therapeutic Activity: 8-22 mins                    G Codes:       Jorge Clements PT, DPT Acute Rehabilitation  [phone] Pager [phone]     Jorge Clements 08/15/2017, 4:55 PM

## 2017-08-15 NOTE — Progress Notes (Signed)
PROGRESS NOTE    Doren Kaspar  WUJ:[id number] DOB: 10-24-1945 DOA: 08/11/2017 PCP: Patient, No Pcp Per  Brief Narrative: 72 y.o.male,with history of COPD, hypertension and peripheral vascular disease status post stenting of both legs presented with weakness that is ongoing for quite some time (in patient's words) but worse in the past 2-3 days. He reports that he did not have enough strength to get up and ambulate. On the evening of admission he fell on his right side and feels that he tripped on something and was very weak. Denies hitting his head but did sustain some laceration in his right arm. He also reports pain and swelling of his right foot and calf which has been ongoing for "quite some time"but more painful in the past 2 days.  Patient initially admitted for right leg cellulitis. He continued to complain of pain in his bilateral legs and having difficulty ambulating. On 4/26 after he had physical therapy done, nurse noted blood on the floor coming from his right foot with an ulcer on the plantar surface that broke up from a blood-filled blister. Overnight he had low-grade fever with leukocytosis    Assessment & Plan:   Principal Problem:   Weakness Active Problems:   COPD (chronic obstructive pulmonary disease) (HCC)   Cellulitis of right leg   Fall at home, initial encounter   Thrombocytopenia (HCC)   Weakness generalized   Hypokalemia   Essential hypertension   Pressure injury of skin   Infected blister of foot Right leg cellulitis and plantar ulcer- Had bleeding from the right plantar blister yesterday and is currently foul-smelling. Had chronic skin changes on his right foot with a blister on his right second toe and the plantar aspect which he says has been present for several weeks. He now has an ulcer over the plantar surface with foul-smelling discharge. No bleeding noted today.  MRI of the right foot with and without contrast ordered.  Continue vancomycin  and clindamycin for now.  Doppler of the right leg negative for DVT.  Distal pulses palpable tibial.MRI- Open wound on the plantar aspect of the forefoot communicating with a large dissecting abscess as detailed above. 2. Associated severe cellulitis and moderate myofasciitis. 3. No definite MR findings for septic arthritis or osteomyelitis.  DR DUDA CONSULTED.   Generalized lower extremity weakness. Reports fall at home. Head CT without acute findings. Seen by PT and recommended SNF. b12 low (148) and given 1 dose of IM vitamin B12. Needs monthly injections. TSH normal. Negative HIV antibody  COPD (chronic obstructive pulmonary disease) (HCC) Reportsquit smoking and currently does vaping. Counseled on cessation. Asymptomatic.     DVT prophylaxisSCD Code Status: FULL Family Communication NONE Disposition Plan:TBD  Consultants:ORTHO Procedures: NONE Antimicrobials: VANCO CLINDA Subjective: NO COMPLAINTS  Objective: Vitals:   08/14/17 2043 08/15/17 0500 08/15/17 0635 08/15/17 1417  BP: (!) 101/59 (!) 97/55 (!) 108/55 (!) 131/46  Pulse: 61 63  68  Resp: Temp: (!) 97.5 F (36.4 C) 98 F (36.7 C)  98.3 F (36.8 C)  TempSrc: Oral Oral  Oral  SpO2: 99% 98%  96%  Weight:      Height:        Intake/Output Summary (Last 24 hours) at 08/15/2017 1516 Last data filed at 08/15/2017 1011 Gross per 24 hour  Intake 740 ml  Output 675 ml  Net 65 ml   Filed Weights   08/11/17 0127 08/11/17 0906  Weight: 86.2 kg (190 lb) 87 kg (  191 lb 12.8 oz)    Examination:  General exam: Appears calm and comfortable  Respiratory system: Clear to auscultation. Respiratory effort normal. Cardiovascular system: S1 & S2 heard, RRR. No JVD, murmurs, rubs, gallops or clicks. No pedal edema. Gastrointestinal system: Abdomen is nondistended, soft and nontender. No organomegaly or masses felt. Normal bowel sounds heard. Central nervous system: Alert and oriented. No focal  neurological deficits. Extremities: RIGHT FOOT EDEMA DRESSINGS ON. Skin: No rashes, lesions or ulcers Psychiatry: Judgement and insight appear normal. Mood & affect appropriate.     Data Reviewed: I have personally reviewed following labs and imaging studies  CBC: Recent Labs  Lab 08/11/17 0221 08/12/17 0406 08/13/17 0435 08/15/17 0402  WBC 10.6* 11.0* 13.2* 7.1  NEUTROABS  --   --   --  4.9  HGB 12.8* 12.7* 11.4* 10.0*  HCT 37.6* 37.7* 34.8* 30.0*  MCV 87.9 89.5 88.8 88.0  PLT 76* 94* 95* 123*   Basic Metabolic Panel: Recent Labs  Lab 08/11/17 0221 08/12/17 0406 08/15/17 0402  NA 132* 132* 134*  K 3.4* 3.6 3.5  CL 99* 100* 98*  CO2 22 18* 28  GLUCOSE 130* 96 121*  BUN CREATININE 1.03 0.90 0.79  CALCIUM 8.5* 8.3* 8.0*  MG  --   --  1.8   GFR: Estimated Creatinine Clearance: 98.5 mL/min (by C-G formula based on SCr of 0.79 mg/dL). Liver Function Tests: Recent Labs  Lab 08/15/17 0402  AST 139*  ALT 119*  ALKPHOS 129*  BILITOT 0.6  PROT 5.6*  ALBUMIN 2.1*   No results for input(s): LIPASE, AMYLASE in the last 168 hours. No results for input(s): AMMONIA in the last 168 hours. Coagulation Profile: No results for input(s): INR, PROTIME in the last 168 hours. Cardiac Enzymes: Recent Labs  Lab 08/11/17 0221 08/11/17 0923 08/11/17 1513 08/11/17 2029  TROPONINI 0.03* 0.03* 0.03* <0.03   BNP (last 3 results) No results for input(s): PROBNP in the last 8760 hours. HbA1C: No results for input(s): HGBA1C in the last 72 hours. CBG: No results for input(s): GLUCAP in the last 168 hours. Lipid Profile: No results for input(s): CHOL, HDL, LDLCALC, TRIG, CHOLHDL, LDLDIRECT in the last 72 hours. Thyroid Function Tests: No results for input(s): TSH, T4TOTAL, FREET4, T3FREE, THYROIDAB in the last 72 hours. Anemia Panel: No results for input(s): VITAMINB12, FOLATE, FERRITIN, TIBC, IRON, RETICCTPCT in the last 72 hours. Sepsis Labs: Recent Labs  Lab  08/11/17 0401  LATICACIDVEN 1.46    Recent Results (from the past 240 hour(s))  MRSA PCR Screening     Status: None   Collection Time: 08/11/17  9:10 AM  Result Value Ref Range Status   MRSA by PCR NEGATIVE NEGATIVE Final    Comment:        The GeneXpert MRSA Assay (FDA approved for NASAL specimens only), is one component of a comprehensive MRSA colonization surveillance program. It is not intended to diagnose MRSA infection nor to guide or monitor treatment for MRSA infections. Performed at [facility], [address] [place], [place] [address]          Radiology Studies: Mr Foot Right W Wo Contrast  Result Date: 08/14/2017 CLINICAL DATA:  Pain and swelling in the forefoot with difficult ambulation. Plantar foot ulcer noted. Low-grade fever. EXAM: MRI OF THE RIGHT FOREFOOT WITHOUT AND WITH CONTRAST TECHNIQUE: Multiplanar, multisequence MR imaging of the right foot was performed before and after the administration of intravenous contrast. CONTRAST:  17mL MULTIHANCE  GADOBENATE DIMEGLUMINE 529 MG/ML IV SOLN COMPARISON:  None. FINDINGS: There is an open wound on the plantar aspect of the forefoot overlying the region of the second MTP joint. There is an associated communicating large dissecting abscess involving the plantar soft tissues and than dissecting between the second and third metacarpal heads and into the dorsum of the foot. This abscess measures approximately 4.9 x 4.2 x 2.1 cm. A small amount of gas is noted the abscess. There is associated severe cellulitis and moderate myofasciitis but I do not see any definite MR findings for septic arthritis or osteomyelitis. IMPRESSION: 1. Open wound on the plantar aspect of the forefoot communicating with a large dissecting abscess as detailed above. 2. Associated severe cellulitis and moderate myofasciitis. 3. No definite MR findings for septic arthritis or osteomyelitis. Electronically Signed   By: Rudie Meyer M.D.   On: 08/14/2017  11:12        Scheduled Meds: . rosuvastatin  10 mg Oral Daily   Continuous Infusions: . clindamycin (CLEOCIN) IV 600 mg (08/15/17 1458)  . vancomycin Stopped (08/15/17 1011)     LOS: 2 days     Alwyn Ren, MD [facility]  If 7PM-7AM, please contact night-coverage [email or web address] Password [facility] 08/15/2017, 3:16 PM

## 2017-08-16 ENCOUNTER — Inpatient Hospital Stay (HOSPITAL_COMMUNITY): Payer: Medicare Other

## 2017-08-16 DIAGNOSIS — R531 Weakness: Secondary | ICD-10-CM

## 2017-08-16 MED ORDER — POTASSIUM CHLORIDE CRYS ER 20 MEQ PO TBCR
40.0000 meq | EXTENDED_RELEASE_TABLET | Freq: Once | ORAL | Status: AC
  Administered 2017-08-16: 40 meq via ORAL
  Filled 2017-08-16: qty 2

## 2017-08-16 MED ORDER — MAGNESIUM SULFATE 4 GM/100ML IV SOLN
4.0000 g | Freq: Once | INTRAVENOUS | Status: AC
  Administered 2017-08-16: 4 g via INTRAVENOUS
  Filled 2017-08-16: qty 100

## 2017-08-16 NOTE — Progress Notes (Signed)
PROGRESS NOTE    Jorge Clements  WUJ:[id number] DOB: July 06, 1945 DOA: 08/11/2017 PCP: Pearson Grippe, MD  Brief Narrative:  72 y.o.male,with history of COPD, hypertension and peripheral vascular disease status post stenting of both legs presented with weakness that is ongoing for quite some time (in patient's words) but worse in the past 2-3 days. He reports that he did not have enough strength to get up and ambulate. On the evening of admission he fell on his right side and feels that he tripped on something and was very weak. Denies hitting his head but did sustain some laceration in his right arm. He also reports pain and swelling of his right foot and calf which has been ongoing for "quite some time"but more painful in the past 2 days.  Patient initially admitted for right leg cellulitis. He continued to complain of pain in his bilateral legs and having difficulty ambulating. On 4/26 after he had physical therapy done, nurse noted blood on the floor coming from his right foot with an ulcer on the plantar surface that broke up from a blood-filled blister. Overnight he had low-grade fever with leukocytosis    Assessment & Plan:   Principal Problem:   Weakness Active Problems:   COPD (chronic obstructive pulmonary disease) (HCC)   Cellulitis of right lower extremity   Fall at home, initial encounter   Thrombocytopenia (HCC)   Weakness generalized   Hypokalemia   Essential hypertension   Pressure injury of skin   Infected blister of foot   Subacute osteomyelitis, right ankle and foot (HCC)  Right leg cellulitis and plantar ulcer- Had bleeding from the right plantar blister yesterday and is currently foul-smelling. Had chronic skin changes on his right foot with a blister on his right second toe and the plantar aspect which he says has been present for several weeks. He now has an ulcer over the plantar surface with foul-smelling discharge. No bleeding noted today.MRI of the  right foot with and without contrast ordered. Continue vancomycin and clindamycin for now. Doppler of the right leg negative for DVT. Distal pulses palpable tibial.MRI- Open wound on the plantar aspect of the forefoot communicating with a large dissecting abscess as detailed above. 2. Associated severe cellulitis and moderate myofasciitis. 3. No definite MR findings for septic arthritis or osteomyelitis.   Dr.Duda  was consulted who recommended vascular surgery consult ABI ordered.  Discussed with Dr. Myra Gianotti.      DVT prophylaxis: SCD Code Status full code Family Communication: None Disposition Plan: TBD patient will need skilled nursing facility placement whenever ready to be discharged.   Consultants: Ortho Dr. Lajoyce Corners, vascular surgery Procedures: None Antimicrobials vancomycin, clindamycin resting in bed in no acute distress denies any new complaints no nausea vomiting or diarrhea   Subjective:Marland Kitchen   Objective: Vitals:   08/15/17 1417 08/15/17 2228 08/16/17 0608 08/16/17 1317  BP: (!) 131/46 118/60 (!) 118/58 (!) 129/59  Pulse: 68 66 71 64  Resp: Temp: 98.3 F (36.8 C) 98 F (36.7 C) 98.2 F (36.8 C) 99.2 F (37.3 C)  TempSrc: Oral Oral Oral Oral  SpO2: 96% 96% 97% 99%  Weight:      Height:        Intake/Output Summary (Last 24 hours) at 08/16/2017 1422 Last data filed at 08/16/2017 1225 Gross per 24 hour  Intake 860 ml  Output 2050 ml  Net -1190 ml   Filed Weights   08/11/17 0127 08/11/17 0906  Weight: 86.2 kg (190  lb) 87 kg (191 lb 12.8 oz)    Examination:  General exam: Appears calm and comfortable  Respiratory system: Clear to auscultation. Respiratory effort normal. Cardiovascular system: S1 & S2 heard, RRR. No JVD, murmurs, rubs, gallops or clicks. No pedal edema. Gastrointestinal system: Abdomen is nondistended, soft and nontender. No organomegaly or masses felt. Normal bowel sounds heard. Central nervous system: Alert and oriented. No  focal neurological deficits. Extremities: right foot dressings on. Skin: No rashes, lesions or ulcers Psychiatry: Judgement and insight appear normal. Mood & affect appropriate.     Data Reviewed: I have personally reviewed following labs and imaging studies  CBC: Recent Labs  Lab 08/11/17 0221 08/12/17 0406 08/12/17 0822 08/13/17 0435 08/15/17 0402  WBC 10.6* 11.0*  --  13.2* 7.1  NEUTROABS  --   --   --   --  4.9  HGB 12.8* 12.7*  --  11.4* 10.0*  HCT 37.6* 37.7* 32.8* 34.8* 30.0*  MCV 87.9 89.5  --  88.8 88.0  PLT 76* 94*  --  95* 123*   Basic Metabolic Panel: Recent Labs  Lab 08/11/17 0221 08/12/17 0406 08/15/17 0402  NA 132* 132* 134*  K 3.4* 3.6 3.5  CL 99* 100* 98*  CO2 22 18* 28  GLUCOSE 130* 96 121*  BUN CREATININE 1.03 0.90 0.79  CALCIUM 8.5* 8.3* 8.0*  MG  --   --  1.8   GFR: Estimated Creatinine Clearance: 98.5 mL/min (by C-G formula based on SCr of 0.79 mg/dL). Liver Function Tests: Recent Labs  Lab 08/15/17 0402  AST 139*  ALT 119*  ALKPHOS 129*  BILITOT 0.6  PROT 5.6*  ALBUMIN 2.1*   No results for input(s): LIPASE, AMYLASE in the last 168 hours. No results for input(s): AMMONIA in the last 168 hours. Coagulation Profile: No results for input(s): INR, PROTIME in the last 168 hours. Cardiac Enzymes: Recent Labs  Lab 08/11/17 0221 08/11/17 0923 08/11/17 1513 08/11/17 2029  TROPONINI 0.03* 0.03* 0.03* <0.03   BNP (last 3 results) No results for input(s): PROBNP in the last 8760 hours. HbA1C: No results for input(s): HGBA1C in the last 72 hours. CBG: No results for input(s): GLUCAP in the last 168 hours. Lipid Profile: No results for input(s): CHOL, HDL, LDLCALC, TRIG, CHOLHDL, LDLDIRECT in the last 72 hours. Thyroid Function Tests: No results for input(s): TSH, T4TOTAL, FREET4, T3FREE, THYROIDAB in the last 72 hours. Anemia Panel: No results for input(s): VITAMINB12, FOLATE, FERRITIN, TIBC, IRON, RETICCTPCT in the last  72 hours. Sepsis Labs: Recent Labs  Lab 08/11/17 0401  LATICACIDVEN 1.46    Recent Results (from the past 240 hour(s))  MRSA PCR Screening     Status: None   Collection Time: 08/11/17  9:10 AM  Result Value Ref Range Status   MRSA by PCR NEGATIVE NEGATIVE Final    Comment:        The GeneXpert MRSA Assay (FDA approved for NASAL specimens only), is one component of a comprehensive MRSA colonization surveillance program. It is not intended to diagnose MRSA infection nor to guide or monitor treatment for MRSA infections. Performed at [facility], [address], [place], [place] [address]          Radiology Studies: No results found.      Scheduled Meds: . rosuvastatin  10 mg Oral Daily   Continuous Infusions: . clindamycin (CLEOCIN) IV 600 mg (08/16/17 1420)  . vancomycin Stopped (08/16/17 1103)     LOS: 3  days     Alwyn Ren, MD [facility] If 7PM-7AM, please contact night-coverage [email or web address] Password [email or web address] 08/16/2017, 2:22 PM

## 2017-08-16 NOTE — Care Management (Signed)
APPT to establish care with Dr. Pearson Grippe originally scheduled by Jeani Hawking CM for 08/17/2017. Since patient has been transferred to [facility], appt was rescheduled for May 22nd at 1pm and added to AVS.

## 2017-08-16 NOTE — Consult Note (Addendum)
Hospital Consult    Reason for Consult:  Non healing wound right foot Requesting Physician:  Lajoyce Corners MRN #:  [id number]  History of Present Illness: This is a 72 y.o. male who was admitted to the hospital on 08/11/17 with pain and non healing wound on his right foot.  He is somewhat of a poor historian.  He states that he has had pain in the right leg for about 5 years.  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  He eventually got the point where he couldn't walk from the car to the time clock without having pain and having to stop.  This forced him to retire 5 years earlier than he had planned.  He states that he has had vascular studies elsewhere, but is unable to tell me where.  When asked if he has ever had a needlestick in his groin and IV dye to evaluate his blood vessels, he states he thinks he had that done in [place].  He says they put a stent in his left leg but he didn't feel it helped him at all.  He states he has been told he had 50% blood flow on the right and needed a bypass but he is unable to tell me who told him this.  He is on IV clindamycin and vancomycin.    He can't tell me how long he has had the wound on his right foot but says it looks worse over the past 3 days.  He states he has been soaking his foot.  He has had recent fevers.  He denies any hx of chest pain or stroke.    He states that he smoked and drank every night for over 40 years and quit both about 4 years ago.  He now vapes.    He does not know his family hx as he states he did not know his parents.  He has a hx of COPD.  He takes aspirin and Crestor.  He is on an ACEI for blood pressure management.   He denies having diabetes.   He did have thrombocytopenia on admission of 76k but this has improved to 123k. He did have leukocytosis and this has improved as well.   Past Medical History:  Diagnosis Date  . COPD (chronic obstructive pulmonary disease) (HCC)     History  reviewed. No pertinent surgical history.  Allergies  Allergen Reactions  . Penicillins Hives    Childhood reaction Has patient had a PCN reaction causing immediate rash, facial/tongue/throat swelling, SOB or lightheadedness with hypotension: No Has patient had a PCN reaction causing severe rash involving mucus membranes or skin necrosis: Yes Has patient had a PCN reaction that required hospitalization: Unknown Has patient had a PCN reaction occurring within the last 10 years: No If all of the above answers are "NO", then may proceed with Cephalosporin use.    Prior to Admission medications   Medication Sig Start Date End Date Taking? Authorizing Provider  aspirin 325 MG tablet Take 325 mg by mouth daily. As a buffered tablet antiplatelet   Yes [provider]  rosuvastatin (CRESTOR) 40 MG tablet Take by mouth. 03/19/17  Yes [provider]  lisinopril-hydrochlorothiazide (PRINZIDE) 20-12.5 MG per tablet Take 1 tablet by mouth daily. Patient not taking: Reported on 03/10/2017 11/23/13   Pricilla Loveless, MD  hydrochlorothiazide (HYDRODIURIL) 25 MG tablet Take 0.5 tablets (12.5 mg total) by mouth daily. 11/17/13 11/23/13  Elpidio Anis,  PA-C    Social History   Socioeconomic History  . Marital status: Divorced    Spouse name: Not on file  . Number of children: Not on file  . Years of education: Not on file  . Highest education level: Not on file  Occupational History  . Not on file  Social Needs  . Financial resource strain: Not on file  . Food insecurity:    Worry: Not on file    Inability: Not on file  . Transportation needs:    Medical: Not on file    Non-medical: Not on file  Tobacco Use  . Smoking status: Former Games developer  . Smokeless tobacco: Never Used  Substance and Sexual Activity  . Alcohol use: Not Currently  . Drug use: No  . Sexual activity: Not on file  Lifestyle  . Physical activity:    Days per week: Not on file    Minutes per session: Not on  file  . Stress: Not on file  Relationships  . Social connections:    Talks on phone: Not on file    Gets together: Not on file    Attends religious service: Not on file    Active member of club or organization: Not on file    Attends meetings of clubs or organizations: Not on file    Relationship status: Not on file  . Intimate partner violence:    Fear of current or ex partner: Not on file    Emotionally abused: Not on file    Physically abused: Not on file    Forced sexual activity: Not on file  Other Topics Concern  . Not on file  Social History Narrative  . Not on file    Family hx:  Pt does not know as he states he did not know his parents.  ROS:  Positive    Negative    All sytems reviewed and are negative  Cardiac:  chest pain/pressure  palpitations  SOB lying flat  DOE  Vascular:  pain in legs while walking  pain in legs at rest  pain in legs at night  non-healing ulcers  hx of DVT  swelling in legs  Pulmonary:  productive cough  asthma/wheezing  home O2  Neurologic:  weakness in  arms  legs  numbness in  arms  legs  hx of CVA  mini stroke  Hematologic:  hx of cancer  Endocrine:    diabetes  thyroid disease  GI  vomiting blood  blood in stool  GU:  CKD/renal failure  HD--[]  M/W/F or  T/T/S  Psychiatric:  anxiety  depression  Musculoskeletal:  arthritis  joint pain  Integumentary:  rashes  ulcers  Constitutional:  fever  chills   Physical Examination  Vitals:   08/15/17 2228 08/16/17 0608  BP: 118/60 (!) 118/58  Pulse: 66 71  Resp: 18 18  Temp: 98 F (36.7 C) 98.2 F (36.8 C)  SpO2: 96% 97%   Body mass index is 25.3 kg/m.  General:  WDWN in NAD Gait: Not observed HENT: WNL, normocephalic Pulmonary: normal non-labored breathing, without Rales, rhonchi,  wheezing Cardiac: regular, without  Murmurs, rubs or gallops; without carotid  bruits Abdomen:  soft, NT/ND, no masses Skin: without rashes Vascular Exam/Pulses:  Right Left  Radial 2+ (normal) 2+ (normal)  Femoral 1-2+ 2+ (normal)  DP Brisk doppler signal Brisk doppler signal > right  PT Brisk doppler signal  Brisk doppler signal >right  Peroneal Brisk doppler signal  Brisk doppler signal > right   Extremities: right foot is malodorous with plantar wound as well as wound between 2nd and 3rd toes with drainage         Musculoskeletal: no muscle wasting or atrophy  Neurologic: A&O X 3;  No focal weakness or paresthesias are detected; speech is fluent/normal Psychiatric:  The pt has Normal affect. Lymph:  No inguinal lymphadenopathy   CBC    Component Value Date/Time   WBC 7.1 08/15/2017 0402   RBC 3.41 (L) 08/15/2017 0402   HGB 10.0 (L) 08/15/2017 0402   HCT 30.0 (L) 08/15/2017 0402   HCT 32.8 (L) 08/12/2017 0822   PLT 123 (L) 08/15/2017 0402   MCV 88.0 08/15/2017 0402   MCH 29.3 08/15/2017 0402   MCHC 33.3 08/15/2017 0402   RDW 15.0 08/15/2017 0402   LYMPHSABS 1.4 08/15/2017 0402   MONOABS 0.6 08/15/2017 0402   EOSABS 0.2 08/15/2017 0402   BASOSABS 0.0 08/15/2017 0402    BMET    Component Value Date/Time   NA 134 (L) 08/15/2017 0402   K 3.5 08/15/2017 0402   CL 98 (L) 08/15/2017 0402   CO2 28 08/15/2017 0402   GLUCOSE 121 (H) 08/15/2017 0402   BUN 12 08/15/2017 0402   CREATININE 0.79 08/15/2017 0402   CALCIUM 8.0 (L) 08/15/2017 0402   GFRNONAA >60 08/15/2017 0402   GFRAA >60 08/15/2017 0402    COAGS: No results found for: INR, PROTIME   Non-Invasive Vascular Imaging:   RLE venous duplex 08/11/17:   IMPRESSION: No evidence of deep venous thrombosis.  ABI's ordered but not done  Statin:  Yes.   Beta Blocker:  No Aspirin:  Yes.   ACEI:  Yes.   ARB:  No. CCB use:  No Other antiplatelets/anticoagulants:  No.    ASSESSMENT/PLAN: This is a 72 y.o. male with hx of PAD and now non healing wound right foot (see above)   -pt  has hx of some sort of intervention on the right leg, which sounds like a stent placement possibly done in [place].  He was told in the past that he would need a bypass on the right leg but did not proceed. -ABI's have been ordered.  He does have good doppler signals present DP/PT/peroneal bilaterally with the right > left.  He does have a palpable right femoral pulse but is slightly less than the left.  -most likely will need arteriogram with possible intervention. -continue IV abx per primary team. -Dr. Myra Gianotti to see pt later today. -continue asa/statin    Doreatha Massed, PA-C [facility] [phone]   I agree with the above.  I have seen and examined the patient.  He has a right foot wound and Vascular lab studies as follows:  Right ABI 0.38 TBI 0.33. Doppler waveforms were dampened monophasic ABI indicate s severe reduction with abnormal TBI. Left ABI 0.68 TBI 0.66  He has undergone some vascular procedure in the past, but he is unsure of the details.  He needs to undergo angiogram with possible intervention tomorrow.  He will be NPO after midnight.  We discussed the risks and benefits of the procedure.  All questions answered.  He is at risk for limb loss without revascularization.  Access will be through a left femoral access, by Dr. Randie Heinz.  Durene Cal

## 2017-08-16 NOTE — Progress Notes (Signed)
Bilateral ABI and TBI completed. Preliminary results completed. Right ABI 0.38 TBI 0.33. Doppler waveforms were dampened monophasic ABI indicate s severe reduction with abnormal TBI. Left ABI 0.68 TBI 0.66 Doppler waveforms are within normal limits. ABI indicates a moderate reduction with abnormal TBIs. [employer], RVS 08/16/2017 12:27 PM

## 2017-08-16 NOTE — Progress Notes (Signed)
Pharmacy Antibiotic Note  Jorge Clements is a 72 y.o. male admitted on 08/11/2017 with non-healing right foot wound.  Pharmacy has been consulted for vancomycin dosing.  MRI neg for osteo. Vascular surgery consulted and will likely need an arteriogram with possible intervention. Afebrile, WBC normal, and renal function stable.  Plan: Vancomycin 1000 mg IV every 12 hours.  Goal trough 10-15 mcg/mL.  Monitor renal function and clinical progress Vancomycin trough in the morning  Height:  (185.4 cm) Weight: 191 lb 12.8 oz (87 kg) IBW/kg (Calculated) : 79.9  Temp (24hrs), Avg:98.2 F (36.8 C), Min:98 F (36.7 C), Max:98.3 F (36.8 C)  Recent Labs  Lab 08/11/17 0221 08/11/17 0401 08/12/17 0406 08/13/17 0435 08/15/17 0402  WBC 10.6*  --  11.0* 13.2* 7.1  CREATININE 1.03  --  0.90  --  0.79  LATICACIDVEN  --  1.46  --   --   --     Estimated Creatinine Clearance: 98.5 mL/min (by C-G formula based on SCr of 0.79 mg/dL).    Allergies  Allergen Reactions  . Penicillins Hives    Childhood reaction Has patient had a PCN reaction causing immediate rash, facial/tongue/throat swelling, SOB or lightheadedness with hypotension: No Has patient had a PCN reaction causing severe rash involving mucus membranes or skin necrosis: Yes Has patient had a PCN reaction that required hospitalization: Unknown Has patient had a PCN reaction occurring within the last 10 years: No If all of the above answers are "NO", then may proceed with Cephalosporin use.    Antimicrobials this admission: Vancomycin 4/27 >>  Clindamycin 4/25 >>   Dose adjustments this admission:   Microbiology results: 4/25 MRSA PCR: negative  Thank you for allowing pharmacy to be a part of this patient's care.   Loura Back, PharmD, BCPS Clinical Pharmacist Clinical phone for 08/16/2017 until 4p is [phone] After 4p, please call Main Rx at [phone] for assistance 08/16/2017 12:18 PM

## 2017-08-17 ENCOUNTER — Encounter (HOSPITAL_COMMUNITY)
Admission: EM | Disposition: A | Discharge status: Skilled Nursing Facility | Payer: Self-pay | Source: Home / Self Care | Attending: Internal Medicine

## 2017-08-17 DIAGNOSIS — D696 Thrombocytopenia, unspecified: Secondary | ICD-10-CM

## 2017-08-17 DIAGNOSIS — E876 Hypokalemia: Secondary | ICD-10-CM

## 2017-08-17 DIAGNOSIS — J449 Chronic obstructive pulmonary disease, unspecified: Secondary | ICD-10-CM

## 2017-08-17 DIAGNOSIS — R531 Weakness: Secondary | ICD-10-CM

## 2017-08-17 HISTORY — PX: PERIPHERAL VASCULAR BALLOON ANGIOPLASTY: CATH118281

## 2017-08-17 HISTORY — PX: ABDOMINAL AORTOGRAM: CATH118222

## 2017-08-17 HISTORY — PX: LOWER EXTREMITY ANGIOGRAPHY: CATH118251

## 2017-08-17 LAB — MAGNESIUM: Magnesium: 2.2 mg/dL (ref 1.7–2.4)

## 2017-08-17 LAB — CBC WITH DIFFERENTIAL/PLATELET
Basophils Absolute: 0 10*3/uL (ref 0.0–0.1)
Basophils Relative: 0 %
Eosinophils Absolute: 0.1 10*3/uL (ref 0.0–0.7)
Eosinophils Relative: 3 %
HCT: 34.4 % — ABNORMAL LOW (ref 39.0–52.0)
HEMOGLOBIN: 11.5 g/dL — AB (ref 13.0–17.0)
LYMPHS ABS: 1.1 10*3/uL (ref 0.7–4.0)
LYMPHS PCT: 22 %
MCH: 29.8 pg (ref 26.0–34.0)
MCHC: 33.4 g/dL (ref 30.0–36.0)
MCV: 89.1 fL (ref 78.0–100.0)
Monocytes Absolute: 0.4 10*3/uL (ref 0.1–1.0)
Monocytes Relative: 9 %
NEUTROS ABS: 3.3 10*3/uL (ref 1.7–7.7)
NEUTROS PCT: 66 %
PLATELETS: 191 10*3/uL (ref 150–400)
RBC: 3.86 MIL/uL — AB (ref 4.22–5.81)
RDW: 14.9 % (ref 11.5–15.5)
WBC: 4.9 10*3/uL (ref 4.0–10.5)

## 2017-08-17 LAB — COMPREHENSIVE METABOLIC PANEL
ALT: 64 U/L — AB (ref 17–63)
AST: 38 U/L (ref 15–41)
Albumin: 2.3 g/dL — ABNORMAL LOW (ref 3.5–5.0)
Alkaline Phosphatase: 103 U/L (ref 38–126)
Anion gap: 8 (ref 5–15)
BUN: 6 mg/dL (ref 6–20)
CHLORIDE: 102 mmol/L (ref 101–111)
CO2: 27 mmol/L (ref 22–32)
CREATININE: 0.66 mg/dL (ref 0.61–1.24)
Calcium: 8.2 mg/dL — ABNORMAL LOW (ref 8.9–10.3)
Glucose, Bld: 105 mg/dL — ABNORMAL HIGH (ref 65–99)
Potassium: 3.5 mmol/L (ref 3.5–5.1)
Sodium: 137 mmol/L (ref 135–145)
Total Bilirubin: 0.8 mg/dL (ref 0.3–1.2)
Total Protein: 5.8 g/dL — ABNORMAL LOW (ref 6.5–8.1)

## 2017-08-17 LAB — PROTIME-INR
INR: 1.13
Prothrombin Time: 14.4 seconds (ref 11.4–15.2)

## 2017-08-17 LAB — VANCOMYCIN, TROUGH: VANCOMYCIN TR: 12 ug/mL — AB (ref 15–20)

## 2017-08-17 LAB — POCT ACTIVATED CLOTTING TIME: Activated Clotting Time: 213 seconds

## 2017-08-17 SURGERY — ABDOMINAL AORTOGRAM
Anesthesia: LOCAL | Laterality: Right

## 2017-08-17 MED ORDER — HYDRALAZINE HCL 20 MG/ML IJ SOLN: 5.0000 mg | INTRAMUSCULAR | Status: DC | PRN

## 2017-08-17 MED ORDER — LIDOCAINE HCL (PF) 1 % IJ SOLN
INTRAMUSCULAR | Status: DC | PRN
  Administered 2017-08-17: 2 mL
  Administered 2017-08-17: 18 mL

## 2017-08-17 MED ORDER — FENTANYL CITRATE (PF) 100 MCG/2ML IJ SOLN
INTRAMUSCULAR | Status: AC
  Filled 2017-08-17: qty 2

## 2017-08-17 MED ORDER — IODIXANOL 320 MG/ML IV SOLN
INTRAVENOUS | Status: DC | PRN
  Administered 2017-08-17: 195 mL via INTRA_ARTERIAL

## 2017-08-17 MED ORDER — HEPARIN SODIUM (PORCINE) 1000 UNIT/ML IJ SOLN
INTRAMUSCULAR | Status: AC
  Filled 2017-08-17: qty 1

## 2017-08-17 MED ORDER — LIDOCAINE HCL (PF) 1 % IJ SOLN
INTRAMUSCULAR | Status: AC
  Filled 2017-08-17: qty 30

## 2017-08-17 MED ORDER — MIDAZOLAM HCL 2 MG/2ML IJ SOLN
INTRAMUSCULAR | Status: DC | PRN
  Administered 2017-08-17: 0.5 mg via INTRAVENOUS

## 2017-08-17 MED ORDER — ACETAMINOPHEN 325 MG PO TABS: 650.0000 mg | ORAL_TABLET | ORAL | Status: DC | PRN

## 2017-08-17 MED ORDER — HEPARIN SODIUM (PORCINE) 1000 UNIT/ML IJ SOLN
INTRAMUSCULAR | Status: DC | PRN
  Administered 2017-08-17: 9000 [IU] via INTRAVENOUS
  Administered 2017-08-17: 4000 [IU] via INTRAVENOUS

## 2017-08-17 MED ORDER — SODIUM CHLORIDE 0.9% FLUSH: 3.0000 mL | INTRAVENOUS | Status: DC | PRN

## 2017-08-17 MED ORDER — SODIUM CHLORIDE 0.9 % IV SOLN
INTRAVENOUS | Status: DC
  Administered 2017-08-17 – 2017-08-19 (×3): via INTRAVENOUS

## 2017-08-17 MED ORDER — SODIUM CHLORIDE 0.9% FLUSH
3.0000 mL | Freq: Two times a day (BID) | INTRAVENOUS | Status: DC
  Administered 2017-08-19 – 2017-08-20 (×2): 3 mL via INTRAVENOUS

## 2017-08-17 MED ORDER — ONDANSETRON HCL 4 MG/2ML IJ SOLN
4.0000 mg | Freq: Four times a day (QID) | INTRAMUSCULAR | Status: DC | PRN
  Administered 2017-08-18 (×2): 4 mg via INTRAVENOUS

## 2017-08-17 MED ORDER — FENTANYL CITRATE (PF) 100 MCG/2ML IJ SOLN
INTRAMUSCULAR | Status: DC | PRN
  Administered 2017-08-17: 50 ug via INTRAVENOUS

## 2017-08-17 MED ORDER — HEPARIN (PORCINE) IN NACL 1000-0.9 UT/500ML-% IV SOLN
INTRAVENOUS | Status: AC
  Filled 2017-08-17: qty 1000

## 2017-08-17 MED ORDER — SODIUM CHLORIDE 0.9 % IV SOLN: 250.0000 mL | INTRAVENOUS | Status: DC | PRN

## 2017-08-17 MED ORDER — LABETALOL HCL 5 MG/ML IV SOLN: 10.0000 mg | INTRAVENOUS | Status: DC | PRN

## 2017-08-17 MED ORDER — MIDAZOLAM HCL 2 MG/2ML IJ SOLN
INTRAMUSCULAR | Status: AC
  Filled 2017-08-17: qty 2

## 2017-08-17 MED ORDER — SODIUM CHLORIDE 0.9 % WEIGHT BASED INFUSION: 1.0000 mL/kg/h | INTRAVENOUS | Status: AC

## 2017-08-17 MED ORDER — HEPARIN (PORCINE) IN NACL 2-0.9 UNITS/ML
INTRAMUSCULAR | Status: AC | PRN
  Administered 2017-08-17 (×2): 500 mL

## 2017-08-17 SURGICAL SUPPLY — 23 items
BAG SNAP BAND KOVER 36X36 (MISCELLANEOUS) ×1 IMPLANT
CATH ANGIO 5F BER2 65CM (CATHETERS) ×1 IMPLANT
CATH OMNI FLUSH 5F 65CM (CATHETERS) ×1 IMPLANT
CATH QUICKCROSS .035X135CM (MICROCATHETER) ×2 IMPLANT
COVER DOME SNAP 22 D (MISCELLANEOUS) ×1 IMPLANT
COVER PRB 48X5XTLSCP FOLD TPE (BAG) IMPLANT
COVER PROBE 5X48 (BAG) ×6
DEVICE ONE SNARE 10MM (MISCELLANEOUS) ×1 IMPLANT
DEVICE TORQUE .025-.038 (MISCELLANEOUS) ×1 IMPLANT
GUIDEWIRE ANGLED .035X150CM (WIRE) ×1 IMPLANT
GUIDEWIRE LT ZIPWIRE 035X260 (WIRE) ×1 IMPLANT
KIT MICROPUNCTURE NIT STIFF (SHEATH) ×1 IMPLANT
KIT PV (KITS) ×3 IMPLANT
NDL PERC 21GX4CM (NEEDLE) IMPLANT
NEEDLE PERC 21GX4CM (NEEDLE) ×3 IMPLANT
SHEATH AVANTI 11CM 5FR (SHEATH) ×1 IMPLANT
SHEATH AVANTI 11CM 7FR (SHEATH) ×1 IMPLANT
SHEATH FLEXOR ANSEL 1 7F 45CM (SHEATH) ×1 IMPLANT
SHIELD RADPAD SCOOP 12X17 (MISCELLANEOUS) ×1 IMPLANT
SYR MEDRAD MARK V 150ML (SYRINGE) ×3 IMPLANT
TRANSDUCER W/STOPCOCK (MISCELLANEOUS) ×3 IMPLANT
TRAY PV CATH (CUSTOM PROCEDURE TRAY) ×3 IMPLANT
WIRE BENTSON .035X145CM (WIRE) ×1 IMPLANT

## 2017-08-17 NOTE — Op Note (Signed)
Patient name: Jorge Clements MRN: [id number] DOB: April 05, 1946 Sex: male  08/17/2017 Pre-operative Diagnosis: critical right lower extremity ischemia Post-operative diagnosis:  Same Surgeon:  Apolinar Junes C. Randie Heinz, MD Procedure Performed: 1.  Ultrasound-guided cannulation left common femoral artery 2.  Ultrasound-guided cannulation right AT 3.  Aortogram bilateral lower extremity runoff 4.  Attempted angioplasty right SFA 5.  Moderate sedation with fentanyl and Versed for 89 minutes 6.  Percutaneous closure left common femoral artery with Mynx device.  Indications:  72 year old male with history of right superficial femoral artery stenting by report now has a significant foot wound on the right side.  He is indicated for angiogram with possible intervention.  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  There is a stenosis that is approximately 50% of the right external iliac artery but given that it is non-flow-limiting this was not treated.  The profunda is the dominant runoff of the leg reconstitutes and above-knee popliteal artery gives 3 vessels running off to the foot.  Proximally extending into the common femoral artery from the right SFA there is an occluded stent and also there is another occluded stent distally in the SFA.  I could not get through the stents antegrade and attempted retrograde because of dissection of the common femoral artery.  Ultimately this was an unsuccessful attempt at treatment from both an antegrade and retrograde direction.   Procedure:  The patient was identified in the holding area and taken to room 8.  The patient was then placed supine on the table and prepped and draped in the usual sterile fashion.  A time out was called.  Ultrasound was used to identify the left common femoral artery and this was noted to be patent and an image was stored.  A micropuncture needle was used to access the left common femoral artery this was done using ultrasound guided direction.  An  018 wire was advanced without resistance and a micropuncture sheath was placed.  The 018 wire was removed and a benson wire was placed.  The micropuncture sheath was exchanged for a 5 french sheath.  An omniflush catheter was advanced over the wire to the level of L-1.  An abdominal angiogram was obtained.  We then performed bilateral lower extremity runoff and on the left side there was no flow-limiting stenosis all the way to the foot and on the right side the above findings.  We then crossed the bifurcation exchanged for a 7 French sheath.  We attempted antegrade using bare catheter Glidewire including the back of a Glidewire to get into the beginning of the stent but we are unable to do this.  This time I used ultrasound guidance to identify the anterior tibial artery at the ankle.  This was noted to be patent and was cannulated with micropuncture needle under direct visualization.  An image was stored in the chart.  A micropuncture wire was placed followed by sheath.  We then used a Glidewire and quick cross catheter to go retrograde up through the initial occluded stent and then into the more proximal stent.  Patient had been heparinized after the sheath was placed and he remained heparinized with an ACT of 214 and was given an additional 5000 units on top of the initial 9000 units.  We attempted to cross through the stent retrograde and with imaging we are able to get in the common femoral artery but there was a dissection plane.  We attempted to snare the wire from the opposite direction but  could not do so.  We also caused a small perforation.  With all this given that this is a common femoral artery elected to terminate.  Distally we remove the quick cross catheter and wire.  In the left common femoral artery we exchanged for a short 7 French sheath and then deployed a minx device for closure.  His right common femoral artery was easily palpable there is no hematoma.  Minx device did deploy easily.  He  tolerated procedure without immediate complication.   Contrast: 195cc  Ashaki Frosch C. Randie Heinz, MD [facility] [place] Office: [phone] Pager: [phone]

## 2017-08-17 NOTE — Progress Notes (Signed)
Patient arrived from the Cath Lab. Patient is alert and oriented. Vital signs are stable. Patient has foam on right elbow from a fall at home in his bathroom. Area is surrounded by ecchymosis. He also has ecchymosis on his left hand. Patient has ecchymosis on his right leg and a non healing wound on his right foot. Patient states his foot has been non healing for approximately two years. He states that he lost his balance and fell to the floor on Friday 08/12/2017. He stated that his son called 911 and an ambulance transported him to [facility].  Patient Left Femoral is clean dry and intact with gauze dressing level 0. Patient denies any pain at this time. Patient has been placed on telemetry, CCMD has been notified. He is resting comfortably in bed and call be is within reach.

## 2017-08-17 NOTE — Progress Notes (Addendum)
Pharmacy Antibiotic Note  Jorge Clements is a 72 y.o. male admitted on 08/11/2017 with non-healing right foot wound.  Pharmacy has been consulted for vancomycin dosing.  MRI neg for osteo. Vascular surgery consulted and getting arteriogram with possible intervention today. Afebrile, WBC normal, and renal function stable. Trough is therapeutic at 12.   Plan: Vancomycin 1000 mg IV every 12 hours.  Goal trough 10-15 mcg/mL.  Monitor renal function and clinical progress F/U plan for abx  Height:  (185.4 cm) Weight: 191 lb 12.8 oz (87 kg) IBW/kg (Calculated) : 79.9  Temp (24hrs), Avg:98.9 F (37.2 C), Min:98.1 F (36.7 C), Max:99.3 F (37.4 C)  Recent Labs  Lab 08/11/17 0221 08/11/17 0401 08/12/17 0406 08/13/17 0435 08/15/17 0402 08/17/17 0433 08/17/17 0903  WBC 10.6*  --  11.0* 13.2* 7.1 4.9  --   CREATININE 1.03  --  0.90  --  0.79 0.66  --   LATICACIDVEN  --  1.46  --   --   --   --   --   VANCOTROUGH  --   --   --   --   --   --  12*    Estimated Creatinine Clearance: 98.5 mL/min (by C-G formula based on SCr of 0.66 mg/dL).    Allergies  Allergen Reactions  . Penicillins Hives    Childhood reaction Has patient had a PCN reaction causing immediate rash, facial/tongue/throat swelling, SOB or lightheadedness with hypotension: No Has patient had a PCN reaction causing severe rash involving mucus membranes or skin necrosis: Yes Has patient had a PCN reaction that required hospitalization: Unknown Has patient had a PCN reaction occurring within the last 10 years: No If all of the above answers are "NO", then may proceed with Cephalosporin use.    Antimicrobials this admission: Vancomycin 4/27 >>  Clindamycin 4/25 >>   Dose adjustments this admission: 5/1 VT 12 - cont 1g q12  Microbiology results: 4/25 MRSA PCR: negative  Thank you for allowing pharmacy to be a part of this patient's care.   Loura Back, PharmD, BCPS Clinical Pharmacist Clinical phone for  08/17/2017 until 4p is [phone] After 4p, please call Main Rx at [phone] for assistance 08/17/2017 10:53 AM

## 2017-08-17 NOTE — Progress Notes (Signed)
CRITICAL VALUE ALERT  Critical Value:  INR >10  Date & Time Notified:  5/1 0648  Provider Notified: n/a  Orders Received/Actions taken: Questionable result, pt is not on any anticoagulants, test re-ordered

## 2017-08-17 NOTE — Progress Notes (Signed)
Physical Therapy Cancellation Note   08/17/17 1051  PT Visit Information  Last PT Received On 08/17/17  Reason Eval/Treat Not Completed Patient at procedure or test/unavailable. PT will check on pt later as time allows.    Erline Levine, PTA Pager: [phone]

## 2017-08-17 NOTE — Progress Notes (Signed)
PROGRESS NOTE   Jorge Clements  RUE:[id number]    DOB: 1945-07-11    DOA: 08/11/2017  PCP: Pearson Grippe, MD   I have briefly reviewed patients previous medical records in [facility].  Brief Narrative:  72 year old male with PMH of COPD, HTN, PAD status post stenting of both legs presented to [facility] ED on 08/11/2017 with weakness ongoing for quite some time but worse in the 2-3 days PTA, did not have enough strength to get up and ambulate and on the evening of admission fell to his right side after tripping on something, denied hitting his head but did sustain laceration of right arm, pain and swelling of his right foot and calf.  He was admitted for cellulitis of his right leg complicating nonhealing wound.   Assessment & Plan:   Principal Problem:   Weakness Active Problems:   COPD (chronic obstructive pulmonary disease) (HCC)   Cellulitis of right lower extremity   Fall at home, initial encounter   Thrombocytopenia (HCC)   Weakness generalized   Hypokalemia   Essential hypertension   Pressure injury of skin   Infected blister of foot   Subacute osteomyelitis, right ankle and foot (HCC)   Cellulitis/abscess of right foot complicating PAD with nonhealing wound: Had been empirically started on IV clindamycin, vancomycin was subsequently added.  Venous Doppler negative for DVT.  Right ABI 0.38 and left ABI 0.68.  Vascular surgery was consulted and underwent angiogram with attempted angioplasty of the right SFA.  Patient states that he is supposed to have another procedure tomorrow.  Generalized weakness with fall: CT head negative.  PT evaluated and recommends SNF.  TSH normal.  Folate normal.  COPD: Stable without clinical bronchospasm.  Former smoker but quit 4 years ago.  Elevated troponin: Minimal and flat trend.  Low index of suspicion for ACS.  Normocytic anemia: Stable  Thrombocytopenia: Resolved  Abnormal LFTs: Unclear etiology.  Improved.  Hypokalemia:  Replaced.  Dehydration with hyponatremia: HCTZ and ARB held.  IV fluids.  Hyponatremia resolved.   DVT prophylaxis: SCDs Code Status: Full Family Communication: None at bedside Disposition: DC to SNF pending clinical improvement   Consultants:  Vascular  Procedures:  Angiogram 5/1  Antimicrobials:  IV clindamycin and vancomycin   Subjective: Did not get to see patient until late this evening because he had been in the OR since midmorning today.  Patient denies any complaints.  Denies pain and states that he supposed to go for another vascular procedure tomorrow.  No dyspnea.  ROS: As above.  Objective:  Vitals:   08/17/17 1445 08/17/17 1500 08/17/17 1510 08/17/17 1607  BP: (!) 148/58 (!) 126/59  (!) 143/55  Pulse: 66 65 65 62  Resp: (!) 21 17 (!) 22 17  Temp:    97.9 F (36.6 C)  TempSrc:    Oral  SpO2: 100% 100% 100% 100%  Weight:      Height:        Examination:  General exam: Pleasant middle-aged male, moderately built and nourished lying comfortably propped up in bed. Respiratory system: Clear to auscultation. Respiratory effort normal. Cardiovascular system: S1 & S2 heard, RRR. No JVD, murmurs, rubs, gallops or clicks. No pedal edema. Gastrointestinal system: Abdomen is nondistended, soft and nontender. No organomegaly or masses felt. Normal bowel sounds heard. Central nervous system: Alert and oriented. No focal neurological deficits. Extremities: Symmetric 5 x 5 power.  Right foot wound dressing intact but mildly soiled. Skin: No rashes, lesions or ulcers Psychiatry: Judgement  and insight appear normal. Mood & affect appropriate.     Data Reviewed: I have personally reviewed following labs and imaging studies  CBC: Recent Labs  Lab 08/11/17 0221 08/12/17 0406 08/12/17 6295 08/13/17 0435 08/15/17 0402 08/17/17 0433  WBC 10.6* 11.0*  --  13.2* 7.1 4.9  NEUTROABS  --   --   --   --  4.9 3.3  HGB 12.8* 12.7*  --  11.4* 10.0* 11.5*  HCT 37.6*  37.7* 32.8* 34.8* 30.0* 34.4*  MCV 87.9 89.5  --  88.8 88.0 89.1  PLT 76* 94*  --  95* 123* 191   Basic Metabolic Panel: Recent Labs  Lab 08/11/17 0221 08/12/17 0406 08/15/17 0402 08/17/17 0433  NA 132* 132* 134* 137  K 3.4* 3.6 3.5 3.5  CL 99* 100* 98* 102  CO2 22 18* 28 27  GLUCOSE 130* 96 121* 105*  BUN CREATININE 1.03 0.90 0.79 0.66  CALCIUM 8.5* 8.3* 8.0* 8.2*  MG  --   --  1.8 2.2   Liver Function Tests: Recent Labs  Lab 08/15/17 0402 08/17/17 0433  AST 139* 38  ALT 119* 64*  ALKPHOS 129* 103  BILITOT 0.6 0.8  PROT 5.6* 5.8*  ALBUMIN 2.1* 2.3*   Coagulation Profile: Recent Labs  Lab 08/17/17 0903  INR 1.13   Cardiac Enzymes: Recent Labs  Lab 08/11/17 0221 08/11/17 0923 08/11/17 1513 08/11/17 2029  TROPONINI 0.03* 0.03* 0.03* <0.03   HbA1C: No results for input(s): HGBA1C in the last 72 hours. CBG: No results for input(s): GLUCAP in the last 168 hours.  Recent Results (from the past 240 hour(s))  MRSA PCR Screening     Status: None   Collection Time: 08/11/17  9:10 AM  Result Value Ref Range Status   MRSA by PCR NEGATIVE NEGATIVE Final    Comment:        The GeneXpert MRSA Assay (FDA approved for NASAL specimens only), is one component of a comprehensive MRSA colonization surveillance program. It is not intended to diagnose MRSA infection nor to guide or monitor treatment for MRSA infections. Performed at [facility], [address] [place], [place] [address]          Radiology Studies: No results found.      Scheduled Meds: . rosuvastatin  10 mg Oral Daily  . sodium chloride flush  3 mL Intravenous Q12H   Continuous Infusions: . sodium chloride 100 mL/hr at 08/17/17 1701  . sodium chloride    . sodium chloride 1 mL/kg/hr (08/17/17 1257)  . clindamycin (CLEOCIN) IV Stopped (08/17/17 2440)  . vancomycin Stopped (08/17/17 1024)     LOS: 4 days     Marcellus Scott, MD, FACP, [facility]. [facility] Pager [phone]  If 7PM-7AM, please contact night-coverage [email or web address] Password [email or web address] 08/17/2017, 7:21 PM

## 2017-08-17 NOTE — Progress Notes (Signed)
  Progress Note    08/17/2017 9:21 AM * No surgery date entered *  Subjective:  No new pain  Vitals:   08/16/17 2149 08/17/17 0445  BP: (!) 141/58 (!) 133/56  Pulse: 65 (!) 57  Resp: 18 18  Temp: 99.3 F (37.4 C) 98.1 F (36.7 C)  SpO2: 94% 100%    Physical Exam: Awake alert and oriented Abdomen is soft nontender nondistended He has nonlabored respirations There is a 1+ right common femoral palpable pulse There is no palpable popliteal pulse on the right There is a wound on the plantar aspect of the right foot that has drainable purulence at this time with surrounding skin irritation erythema but does appear to have improved erythema of the right third and second toe.  CBC    Component Value Date/Time   WBC 4.9 08/17/2017 0433   RBC 3.86 (L) 08/17/2017 0433   HGB 11.5 (L) 08/17/2017 0433   HCT 34.4 (L) 08/17/2017 0433   HCT 32.8 (L) 08/12/2017 0822   PLT 191 08/17/2017 0433   MCV 89.1 08/17/2017 0433   MCH 29.8 08/17/2017 0433   MCHC 33.4 08/17/2017 0433   RDW 14.9 08/17/2017 0433   LYMPHSABS 1.1 08/17/2017 0433   MONOABS 0.4 08/17/2017 0433   EOSABS 0.1 08/17/2017 0433   BASOSABS 0.0 08/17/2017 0433    BMET    Component Value Date/Time   NA 137 08/17/2017 0433   K 3.5 08/17/2017 0433   CL 102 08/17/2017 0433   CO2 27 08/17/2017 0433   GLUCOSE 105 (H) 08/17/2017 0433   BUN 6 08/17/2017 0433   CREATININE 0.66 08/17/2017 0433   CALCIUM 8.2 (L) 08/17/2017 0433   GFRNONAA >60 08/17/2017 0433   GFRAA >60 08/17/2017 0433    INR No results found for: INR   Intake/Output Summary (Last 24 hours) at 08/17/2017 0921 Last data filed at 08/17/2017 0851 Gross per 24 hour  Intake 1130 ml  Output 3500 ml  Net -2370 ml     Assessment:  72 y.o. male is here with a right foot wound that is draining purulence.  He has a suppose it previous procedure of his right lower extremity with likely stenting in the past but does not know the details of this.  Plan: We will  proceed with angiogram from a left common femoral approach to the right great the right lower extremity today.  Possible expected outcomes include treatment of the underlying vascular insufficiency with need for future amputation with Dr. Lajoyce Corners versus the need for possible bypass surgery in the near future with similar amputation.  All of this was discussed with him and he does demonstrate good understanding.   Brandon C. Randie Heinz, MD [facility] Office: [phone] Pager: [phone]  08/17/2017 9:21 AM

## 2017-08-18 ENCOUNTER — Inpatient Hospital Stay (HOSPITAL_COMMUNITY): Payer: Medicare Other | Admitting: Anesthesiology

## 2017-08-18 ENCOUNTER — Encounter (HOSPITAL_COMMUNITY): Payer: Self-pay | Admitting: Vascular Surgery

## 2017-08-18 ENCOUNTER — Encounter (HOSPITAL_COMMUNITY)
Admission: EM | Disposition: A | Discharge status: Skilled Nursing Facility | Payer: Self-pay | Source: Home / Self Care | Attending: Internal Medicine

## 2017-08-18 ENCOUNTER — Encounter (HOSPITAL_COMMUNITY): Payer: Medicare Other

## 2017-08-18 HISTORY — PX: VEIN HARVEST: SHX6363

## 2017-08-18 HISTORY — PX: FEMORAL-POPLITEAL BYPASS GRAFT: SHX937

## 2017-08-18 HISTORY — PX: WOUND DEBRIDEMENT: SHX247

## 2017-08-18 HISTORY — PX: ENDARTERECTOMY FEMORAL: SHX5804

## 2017-08-18 HISTORY — PX: AMPUTATION TOE: SHX6595

## 2017-08-18 LAB — CBC
HEMATOCRIT: 33.8 % — AB (ref 39.0–52.0)
Hemoglobin: 11 g/dL — ABNORMAL LOW (ref 13.0–17.0)
MCH: 29.4 pg (ref 26.0–34.0)
MCHC: 32.5 g/dL (ref 30.0–36.0)
MCV: 90.4 fL (ref 78.0–100.0)
Platelets: 204 10*3/uL (ref 150–400)
RBC: 3.74 MIL/uL — AB (ref 4.22–5.81)
RDW: 14.9 % (ref 11.5–15.5)
WBC: 7.9 10*3/uL (ref 4.0–10.5)

## 2017-08-18 LAB — CREATININE, SERUM
Creatinine, Ser: 0.77 mg/dL (ref 0.61–1.24)
GFR calc non Af Amer: >60 mL/min (ref >60)

## 2017-08-18 SURGERY — BYPASS GRAFT FEMORAL-POPLITEAL ARTERY
Anesthesia: General | Site: Groin | Laterality: Right

## 2017-08-18 MED ORDER — FENTANYL CITRATE (PF) 100 MCG/2ML IJ SOLN: 25.0000 ug | INTRAMUSCULAR | Status: DC | PRN

## 2017-08-18 MED ORDER — MEPERIDINE HCL 50 MG/ML IJ SOLN: 6.2500 mg | INTRAMUSCULAR | Status: DC | PRN

## 2017-08-18 MED ORDER — PHENOL 1.4 % MT LIQD: 1.0000 | OROMUCOSAL | Status: DC | PRN

## 2017-08-18 MED ORDER — PHENYLEPHRINE HCL 10 MG/ML IJ SOLN
INTRAVENOUS | Status: DC | PRN
  Administered 2017-08-18: 50 ug/min via INTRAVENOUS

## 2017-08-18 MED ORDER — ROCURONIUM BROMIDE 100 MG/10ML IV SOLN
INTRAVENOUS | Status: DC | PRN
  Administered 2017-08-18: 50 mg via INTRAVENOUS
  Administered 2017-08-18: 20 mg via INTRAVENOUS

## 2017-08-18 MED ORDER — ONDANSETRON HCL 4 MG/2ML IJ SOLN
INTRAMUSCULAR | Status: AC
  Filled 2017-08-18: qty 2

## 2017-08-18 MED ORDER — SUGAMMADEX SODIUM 200 MG/2ML IV SOLN
INTRAVENOUS | Status: DC | PRN
  Administered 2017-08-18: 200 mg via INTRAVENOUS

## 2017-08-18 MED ORDER — PROPOFOL 10 MG/ML IV BOLUS
INTRAVENOUS | Status: DC | PRN
  Administered 2017-08-18: 120 mg via INTRAVENOUS

## 2017-08-18 MED ORDER — PROTAMINE SULFATE 10 MG/ML IV SOLN
INTRAVENOUS | Status: AC
  Filled 2017-08-18: qty 5

## 2017-08-18 MED ORDER — FENTANYL CITRATE (PF) 100 MCG/2ML IJ SOLN
INTRAMUSCULAR | Status: DC | PRN
  Administered 2017-08-18: 50 ug via INTRAVENOUS
  Administered 2017-08-18: 100 ug via INTRAVENOUS
  Administered 2017-08-18: 50 ug via INTRAVENOUS
  Administered 2017-08-18 (×2): 25 ug via INTRAVENOUS

## 2017-08-18 MED ORDER — MIDAZOLAM HCL 2 MG/2ML IJ SOLN: 0.5000 mg | Freq: Once | INTRAMUSCULAR | Status: DC | PRN

## 2017-08-18 MED ORDER — DEXAMETHASONE SODIUM PHOSPHATE 10 MG/ML IJ SOLN
INTRAMUSCULAR | Status: AC
  Filled 2017-08-18: qty 1

## 2017-08-18 MED ORDER — SODIUM CHLORIDE 0.9 % IV SOLN
INTRAVENOUS | Status: AC
  Filled 2017-08-18: qty 1.2

## 2017-08-18 MED ORDER — FENTANYL CITRATE (PF) 250 MCG/5ML IJ SOLN
INTRAMUSCULAR | Status: AC
Start: 2017-08-18 — End: 2017-08-18
  Filled 2017-08-18: qty 5

## 2017-08-18 MED ORDER — PROTAMINE SULFATE 10 MG/ML IV SOLN
INTRAVENOUS | Status: DC | PRN
  Administered 2017-08-18 (×2): 10 mg via INTRAVENOUS

## 2017-08-18 MED ORDER — SUGAMMADEX SODIUM 200 MG/2ML IV SOLN
INTRAVENOUS | Status: AC
  Filled 2017-08-18: qty 2

## 2017-08-18 MED ORDER — MAGNESIUM SULFATE 2 GM/50ML IV SOLN: 2.0000 g | Freq: Every day | INTRAVENOUS | Status: DC | PRN

## 2017-08-18 MED ORDER — POTASSIUM CHLORIDE CRYS ER 20 MEQ PO TBCR: 20.0000 meq | EXTENDED_RELEASE_TABLET | Freq: Every day | ORAL | Status: DC | PRN

## 2017-08-18 MED ORDER — HEPARIN SODIUM (PORCINE) 1000 UNIT/ML IJ SOLN
INTRAMUSCULAR | Status: DC | PRN
  Administered 2017-08-18: 5000 [IU] via INTRAVENOUS
  Administered 2017-08-18: 9000 [IU] via INTRAVENOUS

## 2017-08-18 MED ORDER — EPHEDRINE SULFATE 50 MG/ML IJ SOLN
INTRAMUSCULAR | Status: DC | PRN
  Administered 2017-08-18: 5 mg via INTRAVENOUS

## 2017-08-18 MED ORDER — POLYETHYLENE GLYCOL 3350 17 G PO PACK: 17.0000 g | PACK | Freq: Every day | ORAL | Status: DC | PRN

## 2017-08-18 MED ORDER — LACTATED RINGERS IV SOLN
INTRAVENOUS | Status: DC | PRN
  Administered 2017-08-18 (×3): via INTRAVENOUS

## 2017-08-18 MED ORDER — LIDOCAINE HCL (CARDIAC) PF 100 MG/5ML IV SOSY
PREFILLED_SYRINGE | INTRAVENOUS | Status: DC | PRN
  Administered 2017-08-18: 4 mg via INTRAVENOUS

## 2017-08-18 MED ORDER — SODIUM CHLORIDE 0.9 % IV SOLN
INTRAVENOUS | Status: DC | PRN
  Administered 2017-08-18: 11:00:00

## 2017-08-18 MED ORDER — 0.9 % SODIUM CHLORIDE (POUR BTL) OPTIME
TOPICAL | Status: DC | PRN
  Administered 2017-08-18: 2000 mL

## 2017-08-18 MED ORDER — BISACODYL 10 MG RE SUPP: 10.0000 mg | Freq: Every day | RECTAL | Status: DC | PRN

## 2017-08-18 MED ORDER — ROCURONIUM BROMIDE 10 MG/ML (PF) SYRINGE
PREFILLED_SYRINGE | INTRAVENOUS | Status: AC
  Filled 2017-08-18: qty 5

## 2017-08-18 MED ORDER — DEXAMETHASONE SODIUM PHOSPHATE 10 MG/ML IJ SOLN
INTRAMUSCULAR | Status: DC | PRN
  Administered 2017-08-18: 10 mg via INTRAVENOUS

## 2017-08-18 MED ORDER — DOCUSATE SODIUM 100 MG PO CAPS
100.0000 mg | ORAL_CAPSULE | Freq: Every day | ORAL | Status: DC
  Administered 2017-08-19 – 2017-08-20 (×2): 100 mg via ORAL
  Filled 2017-08-18 (×2): qty 1

## 2017-08-18 MED ORDER — MORPHINE SULFATE (PF) 2 MG/ML IV SOLN: 2.0000 mg | INTRAVENOUS | Status: DC | PRN

## 2017-08-18 MED ORDER — PROMETHAZINE HCL 25 MG/ML IJ SOLN: 6.2500 mg | INTRAMUSCULAR | Status: DC | PRN

## 2017-08-18 MED ORDER — ALUM & MAG HYDROXIDE-SIMETH 200-200-20 MG/5ML PO SUSP: 15.0000 mL | ORAL | Status: DC | PRN

## 2017-08-18 MED ORDER — PROPOFOL 10 MG/ML IV BOLUS
INTRAVENOUS | Status: AC
  Filled 2017-08-18: qty 20

## 2017-08-18 MED ORDER — HEPARIN SODIUM (PORCINE) 5000 UNIT/ML IJ SOLN
5000.0000 [IU] | Freq: Three times a day (TID) | INTRAMUSCULAR | Status: DC
  Administered 2017-08-19 – 2017-08-22 (×9): 5000 [IU] via SUBCUTANEOUS
  Filled 2017-08-18 (×8): qty 1

## 2017-08-18 MED ORDER — PANTOPRAZOLE SODIUM 40 MG PO TBEC
40.0000 mg | DELAYED_RELEASE_TABLET | Freq: Every day | ORAL | Status: DC
  Administered 2017-08-18 – 2017-08-22 (×5): 40 mg via ORAL
  Filled 2017-08-18 (×5): qty 1

## 2017-08-18 MED ORDER — LACTATED RINGERS IV SOLN
INTRAVENOUS | Status: DC
Start: 2017-08-18 — End: 2017-08-18
  Administered 2017-08-18: 09:00:00 via INTRAVENOUS

## 2017-08-18 MED ORDER — SODIUM CHLORIDE 0.9 % IV SOLN: 500.0000 mL | Freq: Once | INTRAVENOUS | Status: DC | PRN

## 2017-08-18 MED FILL — Heparin Sod (Porcine)-NaCl IV Soln 1000 Unit/500ML-0.9%: INTRAVENOUS | Qty: 1000 | Status: AC

## 2017-08-18 SURGICAL SUPPLY — 47 items
ADH SKN CLS APL DERMABOND .7 (GAUZE/BANDAGES/DRESSINGS) ×6
BANDAGE ELASTIC 4 VELCRO ST LF (GAUZE/BANDAGES/DRESSINGS) ×1 IMPLANT
BNDG GAUZE ELAST 4 BULKY (GAUZE/BANDAGES/DRESSINGS) ×2 IMPLANT
CANISTER SUCT 3000ML PPV (MISCELLANEOUS) ×4 IMPLANT
CANNULA VESSEL 3MM 2 BLNT TIP (CANNULA) ×1 IMPLANT
CLIP VESOCCLUDE MED 24/CT (CLIP) ×4 IMPLANT
CLIP VESOCCLUDE SM WIDE 24/CT (CLIP) ×4 IMPLANT
COVER PROBE W GEL 5X96 (DRAPES) ×1 IMPLANT
DERMABOND ADVANCED (GAUZE/BANDAGES/DRESSINGS) ×2
DERMABOND ADVANCED .7 DNX12 (GAUZE/BANDAGES/DRESSINGS) IMPLANT
ELECT REM PT RETURN 9FT ADLT (ELECTROSURGICAL) ×4
ELECTRODE REM PT RTRN 9FT ADLT (ELECTROSURGICAL) ×3 IMPLANT
GAUZE SPONGE 4X4 12PLY STRL LF (GAUZE/BANDAGES/DRESSINGS) ×1 IMPLANT
GLOVE BIO SURGEON STRL SZ 6.5 (GLOVE) ×5 IMPLANT
GLOVE BIO SURGEON STRL SZ7.5 (GLOVE) ×5 IMPLANT
GLOVE BIOGEL PI IND STRL 6.5 (GLOVE) ×4 IMPLANT
GLOVE BIOGEL PI IND STRL 7.0 (GLOVE) ×4 IMPLANT
GLOVE BIOGEL PI IND STRL 8 (GLOVE) ×1 IMPLANT
GLOVE BIOGEL PI INDICATOR 6.5 (GLOVE) ×4
GLOVE BIOGEL PI INDICATOR 7.0 (GLOVE) ×4
GLOVE BIOGEL PI INDICATOR 8 (GLOVE) ×1
GLOVE ECLIPSE 6.5 STRL STRAW (GLOVE) ×1 IMPLANT
GLOVE SURG SS PI 7.0 STRL IVOR (GLOVE) ×2 IMPLANT
GOWN STRL REUS W/ TWL LRG LVL3 (GOWN DISPOSABLE) ×6 IMPLANT
GOWN STRL REUS W/ TWL XL LVL3 (GOWN DISPOSABLE) ×3 IMPLANT
GOWN STRL REUS W/TWL LRG LVL3 (GOWN DISPOSABLE) ×24
GOWN STRL REUS W/TWL XL LVL3 (GOWN DISPOSABLE) ×12
KIT BASIN OR (CUSTOM PROCEDURE TRAY) ×4 IMPLANT
KIT TURNOVER KIT B (KITS) ×4 IMPLANT
NS IRRIG 1000ML POUR BTL (IV SOLUTION) ×8 IMPLANT
PACK PERIPHERAL VASCULAR (CUSTOM PROCEDURE TRAY) ×4 IMPLANT
PAD ARMBOARD 7.5X6 YLW CONV (MISCELLANEOUS) ×8 IMPLANT
SLEEVE SURGEON STRL (DRAPES) ×2 IMPLANT
SUT MNCRL AB 4-0 PS2 18 (SUTURE) ×9 IMPLANT
SUT PROLENE 5 0 C 1 24 (SUTURE) ×8 IMPLANT
SUT PROLENE 6 0 BV (SUTURE) ×5 IMPLANT
SUT SILK 2 0 SH (SUTURE) ×5 IMPLANT
SUT SILK 3 0 (SUTURE) ×4
SUT SILK 3-0 18XBRD TIE 12 (SUTURE) IMPLANT
SUT VIC AB 2-0 CT1 27 (SUTURE) ×12
SUT VIC AB 2-0 CT1 TAPERPNT 27 (SUTURE) ×6 IMPLANT
SUT VIC AB 3-0 SH 27 (SUTURE) ×8
SUT VIC AB 3-0 SH 27X BRD (SUTURE) ×6 IMPLANT
TOWEL GREEN STERILE (TOWEL DISPOSABLE) ×4 IMPLANT
TRAY FOLEY MTR SLVR 16FR STAT (SET/KITS/TRAYS/PACK) ×4 IMPLANT
UNDERPAD 30X30 (UNDERPADS AND DIAPERS) ×4 IMPLANT
WATER STERILE IRR 1000ML POUR (IV SOLUTION) ×4 IMPLANT

## 2017-08-18 NOTE — Anesthesia Preprocedure Evaluation (Addendum)
Anesthesia Evaluation  Patient identified by MRN, date of birth, ID band Patient awake    Reviewed: Allergy & Precautions, NPO status , Patient's Chart, lab work & pertinent test results  History of Anesthesia Complications Negative for: history of anesthetic complications  Airway Mallampati: II  TM Distance: >3 FB Neck ROM: full    Dental  (+) Edentulous Upper, Edentulous Lower, Dental Advidsory Given   Pulmonary COPD, Current Smoker,    breath sounds clear to auscultation       Cardiovascular hypertension, Pt. on medications (-) angina+ Peripheral Vascular Disease   Rhythm:Regular Rate:Normal     Neuro/Psych negative neurological ROS     GI/Hepatic negative GI ROS, Elevated LFTs   Endo/Other  negative endocrine ROS  Renal/GU negative Renal ROS     Musculoskeletal   Abdominal   Peds  Hematology negative hematology ROS (+)   Anesthesia Other Findings   Reproductive/Obstetrics                           Anesthesia Physical Anesthesia Plan  ASA: III  Anesthesia Plan: General   Post-op Pain Management:    Induction: Intravenous  PONV Risk Score and Plan: 2 and Ondansetron and Dexamethasone  Airway Management Planned: Oral ETT  Additional Equipment:   Intra-op Plan:   Post-operative Plan: Extubation in OR  Informed Consent: I have reviewed the patients History and Physical, chart, labs and discussed the procedure including the risks, benefits and alternatives for the proposed anesthesia with the patient or authorized representative who has indicated his/her understanding and acceptance.   Dental Advisory Given  Plan Discussed with: Anesthesiologist, CRNA and Surgeon  Anesthesia Plan Comments: (Plan routine monitors, GETA)       Anesthesia Quick Evaluation

## 2017-08-18 NOTE — Progress Notes (Signed)
  Day of Surgery Note    Subjective:  No complaints   Vitals:   08/18/17 1548 08/18/17 1551  BP: (!) 113/55 (!) 119/58  Pulse: (!) 55 (!) 59  Resp: 16 15  Temp:  97.6 F (36.4 C)  SpO2: 100% 100%    Incisions:   Right groin and other incisions are clean and dry Extremities:  Good doppler signals right DP/PT/peroneal Cardiac:  regular Lungs:  Non labored    Assessment/Plan:  This is a 72 y.o. male who is s/p  Procedure Performed: 1.  Right common femoral, superficial femoral, profundofemoral endarterectomy 2.  Harvest right greater saphenous vein 3.  Right common femoral to above-knee popliteal bypass with ipsilateral reversed greater saphenous vein 4.  Open amputation right second and third toes    -pt doing well in pacu with patent bypass graft -continue IV abx per primary team -back to 4 east when bed available   Doreatha Massed, PA-C 08/18/2017 4:08 PM [phone]

## 2017-08-18 NOTE — Transfer of Care (Signed)
Immediate Anesthesia Transfer of Care Note  Patient: Jorge Clements  Procedure(s) Performed: BYPASS GRAFT RIGHT COMMON FEMORAL TO ABOVE KNEE POPLITEAL ARTERY USING RIGHT REVERSED GREAT SAPHENOUS VEIN (Right ) DEBRIDEMENT WOUND RIGHT FOOT (Right Foot) ENDARTERECTOMY RIGHT SUPERFICIAL FEMORAL/PROFUNDA (Right Groin) VEIN HARVEST RIGHT GREAT SAPHENOUS (Right ) AMPUTATION RIGHT SECOND AND THIRD TOES (Right Foot)  Patient Location: PACU  Anesthesia Type:General  Level of Consciousness: awake, alert  and oriented  Airway & Oxygen Therapy: Patient Spontanous Breathing and Patient connected to nasal cannula oxygen  Post-op Assessment: Report given to RN, Post -op Vital signs reviewed and stable and Patient moving all extremities X 4  Post vital signs: Reviewed and stable  Last Vitals:  Vitals Value Taken Time  BP 96/57 08/18/2017  2:51 PM  Temp    Pulse 70 08/18/2017  2:52 PM  Resp 22 08/18/2017  2:52 PM  SpO2 99 % 08/18/2017  2:52 PM  Vitals shown include unvalidated device data.  Last Pain:  Vitals:   08/18/17 0833  TempSrc: Oral  PainSc:       Patients Stated Pain Goal: 2 (08/16/17 2027)  Complications: No apparent anesthesia complications

## 2017-08-18 NOTE — Plan of Care (Signed)
  Problem: Education: Goal: Knowledge of General Education information will improve Outcome: Progressing   Problem: Health Behavior/Discharge Planning: Goal: Ability to manage health-related needs will improve Outcome: Progressing   Problem: Clinical Measurements: Goal: Ability to maintain clinical measurements within normal limits will improve Outcome: Progressing Goal: Will remain free from infection Outcome: Progressing Goal: Diagnostic test results will improve Outcome: Progressing Goal: Respiratory complications will improve Outcome: Progressing Goal: Cardiovascular complication will be avoided Outcome: Progressing   Problem: Activity: Goal: Risk for activity intolerance will decrease Outcome: Progressing   Problem: Nutrition: Goal: Adequate nutrition will be maintained Outcome: Progressing   Problem: Coping: Goal: Level of anxiety will decrease Outcome: Progressing   Problem: Elimination: Goal: Will not experience complications related to bowel motility Outcome: Progressing Goal: Will not experience complications related to urinary retention Outcome: Progressing   Problem: Pain Managment: Goal: General experience of comfort will improve Outcome: Progressing   Problem: Safety: Goal: Ability to remain free from injury will improve Outcome: Progressing   Problem: Skin Integrity: Goal: Risk for impaired skin integrity will decrease Outcome: Progressing   Problem: Clinical Measurements: Goal: Ability to avoid or minimize complications of infection will improve Outcome: Progressing   Problem: Skin Integrity: Goal: Skin integrity will improve Outcome: Progressing   

## 2017-08-18 NOTE — Progress Notes (Signed)
  Progress Note    08/18/2017 8:53 AM 1 Day Post-Op  Subjective:  No new right leg pain  Vitals:   08/18/17 0511 08/18/17 0833  BP: (!) 142/62 126/61  Pulse: (!) 58 65  Resp: (!) 21 19  Temp: 98 F (36.7 C) 98.2 F (36.8 C)  SpO2:  100%    Physical Exam: aaox3 Non labored respirations Abdomen is soft Right leg with dressing in tact  CBC    Component Value Date/Time   WBC 4.9 08/17/2017 0433   RBC 3.86 (L) 08/17/2017 0433   HGB 11.5 (L) 08/17/2017 0433   HCT 34.4 (L) 08/17/2017 0433   HCT 32.8 (L) 08/12/2017 0822   PLT 191 08/17/2017 0433   MCV 89.1 08/17/2017 0433   MCH 29.8 08/17/2017 0433   MCHC 33.4 08/17/2017 0433   RDW 14.9 08/17/2017 0433   LYMPHSABS 1.1 08/17/2017 0433   MONOABS 0.4 08/17/2017 0433   EOSABS 0.1 08/17/2017 0433   BASOSABS 0.0 08/17/2017 0433    BMET    Component Value Date/Time   NA 137 08/17/2017 0433   K 3.5 08/17/2017 0433   CL 102 08/17/2017 0433   CO2 27 08/17/2017 0433   GLUCOSE 105 (H) 08/17/2017 0433   BUN 6 08/17/2017 0433   CREATININE 0.66 08/17/2017 0433   CALCIUM 8.2 (L) 08/17/2017 0433   GFRNONAA >60 08/17/2017 0433   GFRAA >60 08/17/2017 0433    INR    Component Value Date/Time   INR 1.13 08/17/2017 0903     Intake/Output Summary (Last 24 hours) at 08/18/2017 0853 Last data filed at 08/18/2017 0038 Gross per 24 hour  Intake 480 ml  Output 1850 ml  Net -1370 ml     Assessment:  72 y.o. male is s/p attempted endovascular revascularization of rle  Plan: OR today for right fem-pop bypass   Donyelle Enyeart C. Randie Heinz, MD [facility] Office: [phone] Pager: [phone]  08/18/2017 8:53 AM

## 2017-08-18 NOTE — Progress Notes (Signed)
PT Cancellation Note  Patient Details Name: Jorge Clements MRN: [id number] DOB: [birth year]   Cancelled Treatment:    Reason Eval/Treat Not Completed: Patient at procedure or test/unavailable. Pt in OR. Will try again tomorrow.   Angelina Ok [place] 08/18/2017, 9:09 AM Skip Mayer PT [phone]

## 2017-08-18 NOTE — Anesthesia Postprocedure Evaluation (Signed)
Anesthesia Post Note  Patient: Koltin Wehmeyer  Procedure(s) Performed: BYPASS GRAFT RIGHT COMMON FEMORAL TO ABOVE KNEE POPLITEAL ARTERY USING RIGHT REVERSED GREAT SAPHENOUS VEIN (Right ) DEBRIDEMENT WOUND RIGHT FOOT (Right Foot) ENDARTERECTOMY RIGHT SUPERFICIAL FEMORAL/PROFUNDA (Right Groin) VEIN HARVEST RIGHT GREAT SAPHENOUS (Right ) AMPUTATION RIGHT SECOND AND THIRD TOES (Right Foot)     Patient location during evaluation: PACU Anesthesia Type: General Level of consciousness: awake and alert, oriented and patient cooperative Pain management: pain level controlled Vital Signs Assessment: post-procedure vital signs reviewed and stable Respiratory status: spontaneous breathing, nonlabored ventilation, respiratory function stable and patient connected to nasal cannula oxygen Cardiovascular status: blood pressure returned to baseline and stable Postop Assessment: no apparent nausea or vomiting Anesthetic complications: no    Last Vitals:  Vitals:   08/18/17 1548 08/18/17 1551  BP: (!) 113/55 (!) 119/58  Pulse: (!) 55 (!) 59  Resp: 16 15  Temp:  36.4 C  SpO2: 100% 100%    Last Pain:  Vitals:   08/18/17 1521  TempSrc:   PainSc: 0-No pain                 Mohamad Bruso,E. Nekesha Font

## 2017-08-18 NOTE — Anesthesia Procedure Notes (Signed)
Procedure Name: Intubation Date/Time: 08/18/2017 11:12 AM Performed by: Neldon Newport, CRNA Pre-anesthesia Checklist: Timeout performed, Patient being monitored, Suction available, Emergency Drugs available and Patient identified Patient Re-evaluated:Patient Re-evaluated prior to induction Oxygen Delivery Method: Circle system utilized Induction Type: IV induction Ventilation: Mask ventilation without difficulty and Oral airway inserted - appropriate to patient size Laryngoscope Size: Mac and 4 Grade View: Grade I Tube type: Oral Tube size: 7.5 mm Number of attempts: 1 Placement Confirmation: breath sounds checked- equal and bilateral,  positive ETCO2 and ETT inserted through vocal cords under direct vision Secured at: 23 cm Tube secured with: Tape Dental Injury: Teeth and Oropharynx as per pre-operative assessment

## 2017-08-18 NOTE — Progress Notes (Signed)
Pt arrived back to room from PACU. Vitals obtained and stable. Incisions assessed. Diet order placed. Dr. Randie Heinz at bedside. Doppler used for anterior tibial pulse. Right foot dressing in place. Orders received to leave dressing in place. Pt resting with call light within reach. Will continue current plan of care.   Ardeen Jourdain BSN, RN

## 2017-08-18 NOTE — Op Note (Signed)
Patient name: Jorge Clements MRN: [id number] DOB: 24-Jun-1945 Sex: male  08/18/2017 Pre-operative Diagnosis: critical right lower extremity ischemia with wet gangrene Post-operative diagnosis:  Same Surgeon:  Apolinar Junes C. Randie Heinz, MD Assistant: Doreatha Massed, PA;  [facility] Procedure Performed: 1.  Right common femoral, superficial femoral, profundofemoral endarterectomy 2.  Harvest right greater saphenous vein 3.  Right common femoral to above-knee popliteal bypass with ipsilateral reversed greater saphenous vein 4.  Open amputation right second and third toes  Indications: 72 year old male with a history of stenting at outside institution now presents with wet gangrene of the right lower extremity affecting mostly his second and third toes.  He underwent angiogram with attempted revascularization the day prior to this procedure.  He is now indicated for bypass and foot debridement with possible amputation of toes.  Findings: The saphenous vein was noted to be approximately 4 to 5 mm in diameter.  This was used in a reversed fashion for bypass.  Common femoral artery had significant reactive fibrosis surrounding it.  The artery itself had a soft clavicle area but did have significant soft disease throughout and also there was a bare-metal stent protruding into the common femoral artery partially jailing the profunda femoral artery.  The stent was filled with very soft fibrous tissue and it was removed in total from the superficial femoral artery.  The profunda itself had soft nearly occlusive tissue going down into and this was removed.  At completion of endarterectomy we did have smooth surface throughout our common femoral into a profundofemoral artery.  Distally the popliteal artery above the knee was thickened but was free of any flow-limiting disease.  At completion there was a strong signal in the posterior tibial and anterior tibial signal at the level of the ankle.  Next  There was purulence  draining from between the second and third metatarsal heads on the plantar aspect of the foot.  The tissue at the proximal toes was frankly necrotic and very foul-smelling and both toes were removed.  There is adequate bleeding in the wound bed.  This wound was packed open at completion.   Procedure:  The patient was identified in the holding area and taken to the operating room where he was placed supine on the operative table and general anesthesia was induced.  He was sterilely prepped and draped in right lower extremity usual fashion given antibiotics and a timeout was called.  We began with a transverse incision in the right groin overlying the palpable common femoral pulse and dissected down onto the common femoral artery where there was extensive fibrosis.  We could visualize the stent in the superficial femoral artery.  We dissected high up onto the external iliac artery was soft placed a vessel loop around this and down onto the profunda itself in the superficial femoral artery distally to the stent.  We turned our attention to the saphenous vein initially traced a small branch.  We then used ultrasound distally on the leg where our above-knee popliteal incision was planned and I did identify a large saphenous vein there made an incision overlying this.  We then traced via to skip incisions back to the groin dividing branches for saphenous vein between clips and ties.  Distally we clamped our vein and transected and tied off with 2-0 silk suture.  Approximately we clamped it with a side-biting clamp and removed it placed in vein solution and oversewed the saphenofemoral junction with 5-0 Prolene suture in a running mattress fashion.  The  vein was prepared on the back table and noted to be adequate size.  We exposed our above-knee popliteal artery by reflecting our bacillus posteriorly.  There was also fibrotic reaction here we were able to dissected out an area for clamping proximally and distally.  A  tunneler was placed between the above-knee and groin incisions and the patient was heparinized.  He did require an additional dose of heparin for the timing of the case.  We clamped our profundofemoral followed by external iliac artery and then opened the common femoral artery longitudinally onto the stented area.  We were able to remove the stent by grabbing and pulling it out the artery remained intact.  We then extensively formed endarterectomy of the common femoral up into the external iliac artery and also down the profunda until we had adequate antegrade and retrograde flow.  Satisfied with this we irrigated with heparinized saline.  We then spatulated our vein in reverse fashion and sewed in the side with 5-0 Prolene suture.  Upon releasing her class we had strong arterial bleeding through the vein graft.  We then tunneled this vein graft making sure to maintain its orientation and it was marked prior to this.  Distally we clamped our popliteal artery distally and proximally and opened longitudinally it was noted to be healthy enough for suture anastomosis to length and sewed end-to-side with 6-0 Prolene suture.  Prior to the completion of the anastomosis we allowed back bleeding as well as antegrade bleeding and flushed with heparinized saline.  Upon completion of her strong AT and PT signals that nearly disappeared with compression of the vein graft.  Satisfied with this the patient was given 50 mg of protamine we have obtained hemostasis irrigated all of her wounds closed all 4 of them with Vicryl and Monocryl at the [facility], Dermabond.  After the Dermabond had dried we turned attention to the foot.  Second toe was frankly necrotic very foul-smelling and there was significant purulence in the toe had deep tissue that was frankly necrotic.  We began to incise the dentition the second toe essentially fell off.  Third toe was also involved and this was removed.  We then sharply debrided all the surrounding  tissue back to the metatarsal heads and healthy bleeding tissue.  We obtained hemostasis and irrigated thoroughly in the wound bed.  We then placed a dry dressing.  He was then allowed away from anesthesia having tolerated procedure well without immediate complication.  All counts were correct at completion.  EBL 500 cc.   Brandon C. Randie Heinz, MD [facility] Office: [phone] Pager: [phone]

## 2017-08-18 NOTE — Progress Notes (Signed)
PROGRESS NOTE   Jorge Clements  GMW:[id number]    DOB: [birth year]    DOA: 08/11/2017  PCP: Pearson Grippe, MD   I have briefly reviewed patients previous medical records in [facility]  Brief Narrative:  72 year old male with PMH of COPD, HTN, PAD status post stenting of both legs presented to [facility] ED on 08/11/2017 with weakness ongoing for quite some time but worse in the 2-3 days PTA, did not have enough strength to get up and ambulate and on the evening of admission fell to his right side after tripping on something, denied hitting his head but did sustain laceration of right arm, pain and swelling of his right foot and calf.  He was admitted for cellulitis of his right leg complicating nonhealing wound.  Orthopedics and vascular surgery was consulted.  Status post right common femoral >popliteal bypass and amputation of right second and third toes by vascular surgery on 5/2.   Assessment & Plan:   Principal Problem:   Weakness Active Problems:   COPD (chronic obstructive pulmonary disease) (HCC)   Cellulitis of right lower extremity   Fall at home, initial encounter   Thrombocytopenia (HCC)   Weakness generalized   Hypokalemia   Essential hypertension   Pressure injury of skin   Infected blister of foot   Subacute osteomyelitis, right ankle and foot (HCC)   Cellulitis/abscess of right foot complicating PAD with nonhealing wound: Had been empirically started on IV clindamycin, vancomycin was subsequently added.  Venous Doppler negative for DVT.  Right ABI 0.38 and left ABI 0.68.  Vascular surgery was consulted and underwent angiogram but unsuccessful angioplasty of the right SFA.  Status post right common femoral > popliteal bypass and amputation of right second and third toes by vascular surgery on 5/2.  Management per vascular surgery.  Generalized weakness with fall: CT head negative.  PT evaluated and recommends SNF.  TSH normal.  Folate normal.  COPD: Stable without clinical  bronchospasm.  Former smoker but quit 4 years ago.  Elevated troponin: Minimal and flat trend.  Low index of suspicion for ACS.  Normocytic anemia: Stable  Thrombocytopenia: Resolved  Abnormal LFTs: Unclear etiology.  Improved.  Hypokalemia: Replaced.  Dehydration with hyponatremia: HCTZ and ARB held.  IV fluids.  Hyponatremia resolved.   DVT prophylaxis: SCDs Code Status: Full Family Communication: None at bedside Disposition: DC to SNF pending clinical improvement   Consultants:  Vascular Orthopedics  Procedures:  Angiogram 5/1 Status post right femoropopliteal bypass and amputation of right second and third toes by vascular surgery on 5/2.  Antimicrobials:  IV clindamycin and vancomycin   Subjective: Patient was seen this morning prior to procedure.  He had questions about the procedure and was waiting to speak with a surgeon before signing consent.  Stated that he slept well last night.  Mild pain in the right foot.  As per RN, no acute issues.  Discussed with Dr. Randie Heinz prior to procedure.  ROS: As above.  Objective:  Vitals:   08/18/17 1531 08/18/17 1536 08/18/17 1548 08/18/17 1551  BP: (!) 116/52 (!) 117/49 (!) 113/55 (!) 119/58  Pulse: (!) 59 (!) 56 (!) 55 (!) 59  Resp: Temp:    97.6 F (36.4 C)  TempSrc:      SpO2: 100% 100% 100% 100%  Weight:      Height:        Examination:  General exam: Pleasant middle-aged male, moderately built and nourished lying comfortably propped  up in bed. Respiratory system: Clear to auscultation. Respiratory effort normal.  Stable. Cardiovascular system: S1 & S2 heard, RRR. No JVD, murmurs, rubs, gallops or clicks. No pedal edema.  Telemetry personally reviewed: Sinus rhythm with first-degree AV block. Gastrointestinal system: Abdomen is nondistended, soft and nontender. No organomegaly or masses felt. Normal bowel sounds heard.  Stable. Central nervous system: Alert and oriented. No focal neurological  deficits.  Stable. Extremities: Symmetric 5 x 5 power.  Right foot wound dressing intact. Skin: No rashes, lesions or ulcers Psychiatry: Judgement and insight appear normal. Mood & affect appropriate.     Data Reviewed: I have personally reviewed following labs and imaging studies  CBC: Recent Labs  Lab 08/12/17 0406 08/12/17 0822 08/13/17 0435 08/15/17 0402 08/17/17 0433  WBC 11.0*  --  13.2* 7.1 4.9  NEUTROABS  --   --   --  4.9 3.3  HGB 12.7*  --  11.4* 10.0* 11.5*  HCT 37.7* 32.8* 34.8* 30.0* 34.4*  MCV 89.5  --  88.8 88.0 89.1  PLT 94*  --  95* 123* 191   Basic Metabolic Panel: Recent Labs  Lab 08/12/17 0406 08/15/17 0402 08/17/17 0433  NA 132* 134* 137  K 3.6 3.5 3.5  CL 100* 98* 102  CO2 18* 28 27  GLUCOSE 96 121* 105*  BUN CREATININE 0.90 0.79 0.66  CALCIUM 8.3* 8.0* 8.2*  MG  --  1.8 2.2   Liver Function Tests: Recent Labs  Lab 08/15/17 0402 08/17/17 0433  AST 139* 38  ALT 119* 64*  ALKPHOS 129* 103  BILITOT 0.6 0.8  PROT 5.6* 5.8*  ALBUMIN 2.1* 2.3*   Coagulation Profile: Recent Labs  Lab 08/17/17 0903  INR 1.13   Cardiac Enzymes: Recent Labs  Lab 08/11/17 2029  TROPONINI <0.03   HbA1C: No results for input(s): HGBA1C in the last 72 hours. CBG: No results for input(s): GLUCAP in the last 168 hours.  Recent Results (from the past 240 hour(s))  MRSA PCR Screening     Status: None   Collection Time: 08/11/17  9:10 AM  Result Value Ref Range Status   MRSA by PCR NEGATIVE NEGATIVE Final    Comment:        The GeneXpert MRSA Assay (FDA approved for NASAL specimens only), is one component of a comprehensive MRSA colonization surveillance program. It is not intended to diagnose MRSA infection nor to guide or monitor treatment for MRSA infections. Performed at [facility], [address] [place], [place] [address]          Radiology Studies: No results found.      Scheduled Meds: . rosuvastatin  10 mg Oral  Daily  . sodium chloride flush  3 mL Intravenous Q12H   Continuous Infusions: . sodium chloride 100 mL/hr at 08/17/17 1701  . sodium chloride    . clindamycin (CLEOCIN) IV Stopped (08/18/17 0552)  . lactated ringers 10 mL/hr at 08/18/17 0927  . vancomycin 0 mg (08/17/17 2240)     LOS: 5 days     Marcellus Scott, MD, FACP, [facility]. [facility] Pager [phone]  If 7PM-7AM, please contact night-coverage [email or web address] Password [facility] 08/18/2017, 4:27 PM

## 2017-08-18 NOTE — Progress Notes (Signed)
Pt would like more clarification about fem pop procedure before signing consent form. Would like to talk to MD in am.   Judithann Sheen, RN

## 2017-08-19 ENCOUNTER — Encounter (HOSPITAL_COMMUNITY): Payer: Self-pay | Admitting: Vascular Surgery

## 2017-08-19 ENCOUNTER — Inpatient Hospital Stay (HOSPITAL_COMMUNITY): Payer: Medicare Other

## 2017-08-19 DIAGNOSIS — L02611 Cutaneous abscess of right foot: Secondary | ICD-10-CM

## 2017-08-19 DIAGNOSIS — I10 Essential (primary) hypertension: Secondary | ICD-10-CM

## 2017-08-19 DIAGNOSIS — Z9889 Other specified postprocedural states: Secondary | ICD-10-CM

## 2017-08-19 DIAGNOSIS — D62 Acute posthemorrhagic anemia: Secondary | ICD-10-CM

## 2017-08-19 DIAGNOSIS — Z720 Tobacco use: Secondary | ICD-10-CM

## 2017-08-19 LAB — CBC
HEMATOCRIT: 28.7 % — AB (ref 39.0–52.0)
Hemoglobin: 9.5 g/dL — ABNORMAL LOW (ref 13.0–17.0)
MCH: 29.4 pg (ref 26.0–34.0)
MCHC: 33.1 g/dL (ref 30.0–36.0)
MCV: 88.9 fL (ref 78.0–100.0)
PLATELETS: 192 10*3/uL (ref 150–400)
RBC: 3.23 MIL/uL — ABNORMAL LOW (ref 4.22–5.81)
RDW: 14.8 % (ref 11.5–15.5)
WBC: 7.2 10*3/uL (ref 4.0–10.5)

## 2017-08-19 LAB — BASIC METABOLIC PANEL
Anion gap: 12 (ref 5–15)
BUN: 11 mg/dL (ref 6–20)
CALCIUM: 8.1 mg/dL — AB (ref 8.9–10.3)
CO2: 20 mmol/L — ABNORMAL LOW (ref 22–32)
Chloride: 106 mmol/L (ref 101–111)
Creatinine, Ser: 0.79 mg/dL (ref 0.61–1.24)
GFR calc Af Amer: >60 mL/min (ref >60)
GLUCOSE: 123 mg/dL — AB (ref 65–99)
Potassium: 4.9 mmol/L (ref 3.5–5.1)
Sodium: 138 mmol/L (ref 135–145)

## 2017-08-19 MED ORDER — ASPIRIN 325 MG PO TABS
325.0000 mg | ORAL_TABLET | Freq: Every day | ORAL | Status: DC
  Administered 2017-08-19 – 2017-08-22 (×4): 325 mg via ORAL
  Filled 2017-08-19 (×4): qty 1

## 2017-08-19 MED ORDER — SODIUM CHLORIDE 0.9 % IV SOLN
1.0000 g | INTRAVENOUS | Status: DC
  Administered 2017-08-19 – 2017-08-21 (×3): 1 g via INTRAVENOUS
  Filled 2017-08-19 (×3): qty 10

## 2017-08-19 NOTE — Progress Notes (Signed)
Patient ID: Jorge Clements, male   DOB: [birth year], 72 y.o.   MRN: [id number] Patient is status post revascularization to the right lower extremity.  Examination of his foot there is no abscess he has had open resection of the second and third toes involving the metatarsals.  The wound bed is healthy and there is bleeding there is no abscess no necrotic tissue.  Recommended patient proceed with a transmetatarsal amputation.  We will plan for surgery next week.  Patient has a history of smoking and is currently vaping.  Discussed the importance of not vaping with the risk of microcirculatory spasm from the nicotine.

## 2017-08-19 NOTE — Progress Notes (Signed)
Clinical Social Worker following for support and discharge needs. Patient has a bed at [facility] [place] close to his home. Facility is aware at this time patient is not medically ready for discharge. Facility admission coordinator stated they will be able to take patient once cleared by MD.   Marrianne Mood, MSW,  [facility]ors [phone]

## 2017-08-19 NOTE — Plan of Care (Signed)
  Problem: Education: Goal: Knowledge of General Education information will improve Outcome: Progressing   Problem: Health Behavior/Discharge Planning: Goal: Ability to manage health-related needs will improve Outcome: Progressing   Problem: Clinical Measurements: Goal: Ability to maintain clinical measurements within normal limits will improve Outcome: Progressing Goal: Will remain free from infection Outcome: Progressing Goal: Diagnostic test results will improve Outcome: Progressing Goal: Respiratory complications will improve Outcome: Progressing Goal: Cardiovascular complication will be avoided Outcome: Progressing   Problem: Activity: Goal: Risk for activity intolerance will decrease Outcome: Progressing   Problem: Nutrition: Goal: Adequate nutrition will be maintained Outcome: Progressing   Problem: Coping: Goal: Level of anxiety will decrease Outcome: Progressing   Problem: Elimination: Goal: Will not experience complications related to bowel motility Outcome: Progressing Goal: Will not experience complications related to urinary retention Outcome: Progressing   Problem: Pain Managment: Goal: General experience of comfort will improve Outcome: Progressing   Problem: Safety: Goal: Ability to remain free from injury will improve Outcome: Progressing   Problem: Skin Integrity: Goal: Risk for impaired skin integrity will decrease Outcome: Progressing   Problem: Clinical Measurements: Goal: Ability to avoid or minimize complications of infection will improve Outcome: Progressing   Problem: Skin Integrity: Goal: Skin integrity will improve Outcome: Progressing   

## 2017-08-19 NOTE — Progress Notes (Signed)
PROGRESS NOTE   Jorge Clements  ZOX:[id number]    DOB: 02/02/1946    DOA: 08/11/2017  PCP: Pearson Grippe, MD   I have briefly reviewed patients previous medical records in [facility].  Brief Narrative:  72 year old male with PMH of COPD, HTN, PAD status post stenting of both legs presented to [facility] ED on 08/11/2017 with weakness ongoing for quite some time but worse in the 2-3 days PTA, did not have enough strength to get up and ambulate and on the evening of admission fell to his right side after tripping on something, denied hitting his head but did sustain laceration of right arm, pain and swelling of his right foot and calf.  He was admitted for cellulitis of his right leg complicating nonhealing wound.  Orthopedics and vascular surgery was consulted.  Status post right common femoral >popliteal bypass and amputation of right second and third toes by vascular surgery on 5/2.  Orthopedics plans right transmetatarsal amputation next week.  Discussed with ID 5/3 and adjusted antibiotics.   Assessment & Plan:   Principal Problem:   Weakness Active Problems:   COPD (chronic obstructive pulmonary disease) (HCC)   Cellulitis of right lower extremity   Fall at home, initial encounter   Thrombocytopenia (HCC)   Weakness generalized   Hypokalemia   Essential hypertension   Pressure injury of skin   Infected blister of foot   Subacute osteomyelitis, right ankle and foot (HCC)   Cellulitis/abscess of right foot complicating PAD with nonhealing wound: Had been empirically started on IV clindamycin, vancomycin was subsequently added.  Venous Doppler negative for DVT.  Right ABI 0.38 and left ABI 0.68.  Vascular surgery was consulted and underwent angiogram but unsuccessful angioplasty of the right SFA.  Status post right common femoral > popliteal bypass and amputation of right second and third toes by vascular surgery on 5/2.  I discussed with Dr. Randie Heinz 5/3 who indicated that patient will need  further surgery next week by orthopedics.  Dr. Audrie Lia input appreciated and plans right transmetatarsal amputation next week.  I discussed with infectious disease MD on call 5/3 who recommended changing clindamycin to ceftriaxone, continue vancomycin and could possibly discontinue all antibiotics 24 to 48 hours post surgery.  Generalized weakness with fall: CT head negative.  PT evaluated and recommends SNF.  TSH normal.  Folate normal.  COPD: Stable without clinical bronchospasm.  Former smoker but quit 4 years ago.  Elevated troponin: Minimal and flat trend.  Low index of suspicion for ACS.  Normocytic anemia: May have had mild acute blood loss anemia.  Hemoglobin is dropped from 11-9.5.  Will follow CBC in a.m.  Thrombocytopenia: Resolved  Abnormal LFTs: Unclear etiology.  Improved.  Hypokalemia: Replaced.  Dehydration with hyponatremia: HCTZ and ARB held.  Resolved.   DVT prophylaxis: SCDs Code Status: Full Family Communication: None at bedside Disposition: DC to SNF pending clinical improvement, sometime next week.   Consultants:  Vascular Orthopedics  Procedures:  Angiogram 5/1 Status post right femoropopliteal bypass and amputation of right second and third toes by vascular surgery on 5/2.  Antimicrobials:  IV clindamycin-discontinued 5/3 IV vancomycin > IV ceftriaxone 5/3 >.   Subjective: Seen this morning.  Reports mild and appropriate postop pain.  No bleeding or drainage from the right postop wound.  As per RN, no acute issues reported.  ROS: As above.  Objective:  Vitals:   08/19/17 0030 08/19/17 0446 08/19/17 0747 08/19/17 1200  BP: (!) 116/55 (!) 110/50 (!) 106/53 Marland Kitchen)  113/47  Pulse: 67 67 66 65  Resp:   17 18  Temp: 98.9 F (37.2 C) 97.9 F (36.6 C) 98.7 F (37.1 C) 98.5 F (36.9 C)  TempSrc: Oral Oral Oral Oral  SpO2:   100% 100%  Weight:      Height:        Examination:  General exam: Pleasant middle-aged male, moderately built and  nourished lying comfortably propped up in bed. Respiratory system: Clear to auscultation. Respiratory effort normal.  Stable. Cardiovascular system: S1 & S2 heard, RRR. No JVD, murmurs, rubs, gallops or clicks. No pedal edema.  Telemetry personally reviewed: Sinus rhythm with first-degree AV block. Gastrointestinal system: Abdomen is nondistended, soft and nontender. No organomegaly or masses felt. Normal bowel sounds heard. stable. Central nervous system: Alert and oriented. No focal neurological deficits.  Stable. Extremities: Symmetric 5 x 5 power.  Right foot dressing clean and dry. Skin: No rashes, lesions or ulcers Psychiatry: Judgement and insight appear normal. Mood & affect appropriate.     Data Reviewed: I have personally reviewed following labs and imaging studies  CBC: Recent Labs  Lab 08/13/17 0435 08/15/17 0402 08/17/17 0433 08/18/17 1642 08/19/17 1408  WBC 13.2* 7.1 4.9 7.9 7.2  NEUTROABS  --  4.9 3.3  --   --   HGB 11.4* 10.0* 11.5* 11.0* 9.5*  HCT 34.8* 30.0* 34.4* 33.8* 28.7*  MCV 88.8 88.0 89.1 90.4 88.9  PLT 95* 123* 191 204 192   Basic Metabolic Panel: Recent Labs  Lab 08/15/17 0402 08/17/17 0433 08/18/17 1642 08/19/17 1139  NA 134* 137  --  138  K 3.5 3.5  --  4.9  CL 98* 102  --  106  CO2 28 27  --  20*  GLUCOSE 121* 105*  --  123*  BUN 12 6  --  11  CREATININE 0.79 0.66 0.77 0.79  CALCIUM 8.0* 8.2*  --  8.1*  MG 1.8 2.2  --   --    Liver Function Tests: Recent Labs  Lab 08/15/17 0402 08/17/17 0433  AST 139* 38  ALT 119* 64*  ALKPHOS 129* 103  BILITOT 0.6 0.8  PROT 5.6* 5.8*  ALBUMIN 2.1* 2.3*   Coagulation Profile: Recent Labs  Lab 08/17/17 0903  INR 1.13     Recent Results (from the past 240 hour(s))  MRSA PCR Screening     Status: None   Collection Time: 08/11/17  9:10 AM  Result Value Ref Range Status   MRSA by PCR NEGATIVE NEGATIVE Final    Comment:        The GeneXpert MRSA Assay (FDA approved for NASAL  specimens only), is one component of a comprehensive MRSA colonization surveillance program. It is not intended to diagnose MRSA infection nor to guide or monitor treatment for MRSA infections. Performed at [facility], [address] [place], [place] [address]          Radiology Studies: No results found.      Scheduled Meds: . aspirin  325 mg Oral Daily  . docusate sodium  100 mg Oral Daily  . heparin  5,000 Units Subcutaneous Q8H  . pantoprazole  40 mg Oral Daily  . rosuvastatin  10 mg Oral Daily  . sodium chloride flush  3 mL Intravenous Q12H   Continuous Infusions: . sodium chloride 100 mL/hr at 08/19/17 1555  . sodium chloride    . sodium chloride    . clindamycin (CLEOCIN) IV 600 mg (08/19/17 1555)  . magnesium  sulfate 1 - 4 g bolus IVPB    . vancomycin Stopped (08/19/17 1142)     LOS: 6 days     Marcellus Scott, MD, FACP, [facility]. [facility] Pager [phone]  If 7PM-7AM, please contact night-coverage [email or web address] Password [email or web address] 08/19/2017, 3:59 PM

## 2017-08-19 NOTE — Evaluation (Signed)
Occupational Therapy Evaluation Patient Details Name: Jorge Clements MRN: [id number] DOB: 23-Nov-1945 Today's Date: 08/19/2017    History of Present Illness Jorge Clements  is a 72 y.o. male, with history of COPD, hypertension and peripheral vascular disease Recent fall on his right side and feels that he tripped on something and was very weak. 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   Clinical Impression   PTA Pt mod I for ADL/IADL and transfers. Pt currently max A for LB ADL, set up for grooming/eating, and mod A for transfers. Pt will require skilled OT in the acute setting and afterwards at SNF level (currently set to have additional sx for transmetatarsal amputation next week) to maximize safety and independence in ADL and functional transfers. Next session to focus on AE education for LB ADL.     Follow Up Recommendations  SNF;Supervision/Assistance - 24 hour    Equipment Recommendations  Other (comment)(defer to next venue)    Recommendations for Other Services       Precautions / Restrictions Restrictions Weight Bearing Restrictions: Yes RLE Weight Bearing: Weight bearing as tolerated(through heel only)      Mobility Bed Mobility Overal bed mobility: Needs Assistance Bed Mobility: Supine to Sit;Sit to Supine     Supine to sit: Supervision;HOB elevated Sit to supine: Min assist   General bed mobility comments: assist for BLE back into bed  Transfers Overall transfer level: Needs assistance Equipment used: Rolling walker (2 wheeled) Transfers: Sit to/from Stand Sit to Stand: Mod assist;From elevated surface         General transfer comment: Mod A for power up; and assist for balance    Balance Overall balance assessment: Needs assistance Sitting-balance support: Feet supported;No upper extremity supported Sitting balance-Leahy Scale: Good     Standing balance support: Bilateral upper extremity supported;During functional activity Standing balance-Leahy Scale: Poor Standing balance comment: relies on RW for support                           ADL either performed or assessed with clinical judgement   ADL Overall ADL's : Needs assistance/impaired Eating/Feeding: Set up   Grooming: Set up;Sitting   Upper Body Bathing: Minimal assistance   Lower Body Bathing: Maximal assistance;Sitting/lateral leans   Upper Body Dressing : Set up;Sitting   Lower Body Dressing: Maximal assistance;Sit to/from stand Lower Body Dressing Details (indicate cue type and reason): max A to don socks, Pt dependent on RW in standing for pulling up pants will require max A Toilet Transfer: Moderate assistance;Stand-pivot;RW;BSC   Toileting- Clothing Manipulation and Hygiene: Maximal assistance;Sit to/from stand       Functional mobility during ADLs: Moderate assistance;Rolling walker;Cueing for sequencing General ADL Comments: Pt complaining of dizziness with standing, returned to supine     Vision Patient Visual Report: No change from baseline       Perception     Praxis      Pertinent Vitals/Pain Pain Assessment: 0-10 Pain Score: 3  Pain Location: Right foot, surgical site  Pain Intervention(s): Monitored during session;Repositioned;Limited activity within patient's tolerance     Hand Dominance Right   Extremity/Trunk Assessment Upper Extremity Assessment Upper Extremity Assessment: Generalized weakness   Lower Extremity Assessment Lower Extremity Assessment: Generalized weakness   Cervical / Trunk Assessment Cervical / Trunk Assessment: Normal   Communication Communication Communication: No difficulties   Cognition Arousal/Alertness:  Awake/alert Behavior During Therapy: WFL for tasks assessed/performed Overall Cognitive Status: No family/caregiver present to determine baseline cognitive functioning Area of  Impairment: Safety/judgement;Attention                   Current Attention Level: Sustained     Safety/Judgement: Decreased awareness of safety         General Comments       Exercises     Shoulder Instructions      Home Living Family/patient expects to be discharged to:: Private residence Living Arrangements: Alone;Other (Comment)(lives in guest house behind his ex-wife's house.) Available Help at Discharge: Family(exwife and son who has bipolar and likely no dependable help) Type of Home: House Home Access: Stairs to enter [employer] of Steps: 2 Entrance Stairs-Rails: None Home Layout: One level               Home Equipment: Cane - single point;Walker - 2 wheels          Prior Functioning/Environment Level of Independence: Independent with assistive device(s)        Comments: Household ambulation mostly without an AD, sometimes using SPC, limited mobility d/t vascular insufficicency x4+ years        OT Problem List: Decreased strength;Decreased range of motion;Decreased activity tolerance;Impaired balance (sitting and/or standing);Decreased safety awareness;Decreased knowledge of use of DME or AE;Decreased knowledge of precautions;Pain      OT Treatment/Interventions:      OT Goals(Current goals can be found in the care plan section) Acute Rehab OT Goals Patient Stated Goal: return to his guest house  OT Goal Formulation: With patient Time For Goal Achievement: 09/02/17 Potential to Achieve Goals: Good ADL Goals Pt Will Perform Grooming: with supervision;standing Pt Will Perform Lower Body Bathing: with modified independence;with adaptive equipment;sitting/lateral leans Pt Will Perform Lower Body Dressing: with modified independence;sitting/lateral leans;sit to/from stand Pt Will Transfer to Toilet: with supervision;ambulating Pt Will Perform Toileting - Clothing Manipulation and hygiene: with modified independence;sit to/from stand   OT Frequency: Min 1X/week   Barriers to D/C: Decreased caregiver support  Pt lives alone       Co-evaluation              AM-PAC PT "6 Clicks" Daily Activity     Outcome Measure Help from another person eating meals?: None Help from another person taking care of personal grooming?: A Little Help from another person toileting, which includes using toliet, bedpan, or urinal?: A Lot Help from another person bathing (including washing, rinsing, drying)?: A Lot Help from another person to put on and taking off regular upper body clothing?: None Help from another person to put on and taking off regular lower body clothing?: A Lot 6 Click Score: 17   End of Session Equipment Utilized During Treatment: Gait belt;Rolling walker;Oxygen Nurse Communication: Mobility status  Activity Tolerance: Patient tolerated treatment well Patient left: in bed;with call bell/phone within reach  OT Visit Diagnosis: Unsteadiness on feet (R26.81);Other abnormalities of gait and mobility (R26.89);Muscle weakness (generalized) (M62.81);Other symptoms and signs involving cognitive function                Time: 1610-9604 OT Time Calculation (min): 26 min Charges:  OT General Charges $OT Visit: 1 Visit OT Evaluation $OT Eval Moderate Complexity: 1 Mod OT Treatments $Self Care/Home Management : 8-22 mins G-Codes:     Sherryl Manges OTR/L [phone]  Evern Bio Juliah Scadden 08/19/2017, 3:28 PM

## 2017-08-19 NOTE — Progress Notes (Signed)
VASCULAR LAB PRELIMINARY  ARTERIAL  ABI completed:    RIGHT    LEFT    PRESSURE WAVEFORM  PRESSURE WAVEFORM  BRACHIAL 119 triphasic BRACHIAL 124 triphasic  DP 83 Monophasic DP 81 biphasic  AT   AT    PT 77 biphasic PT 76 triphasic  PER   PER    GREAT TOE  NA GREAT TOE  NA    RIGHT LEFT  ABI 0.67 0.65     HONGYING  Shyra Emile, RVT 08/19/2017, 3:42 PM

## 2017-08-19 NOTE — Evaluation (Signed)
Physical Therapy Evaluation Patient Details Name: Jorge Clements MRN: [id number] DOB: 02-18-1946 Today's Date: 08/19/2017   History of Present Illness  Jorge Clements  is a 72 y.o. male, with history of COPD, hypertension and peripheral vascular disease Recent fall on his right side and feels that he tripped on something and was very weak. 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  Clinical Impression  Pt admitted with above diagnosis. Pt currently with functional limitations due to the deficits listed below (see "PT Problem List"). Upon entry, the patient is received semirecumbent in bed, no family/caregiver present. The pt is awake and agreeable to participate. No acute distress noted at this time. Mobility to EOB causes giddiness which does not improved over 4-5 minutes. BP is low, diastolicly orthostatic. Giddiness worsens after SPT to chair with RW (heel weight bearing). Functional mobility assessment reveals global weakness and deconditioning, need for EOB elevated for bed mobiltiy, and elevated surface + modA physical assist for sit to stand transfer. AMB not attempted d/t BP issues. Pivot to chair with RW is slow and labored. Empirically, the patient demonstrates increased risk of recurrent falls AEB gait speed <0.65m/s and forward reach <5". Pt will benefit from skilled PT intervention to increase independence and safety with basic mobility in preparation for discharge to the venue listed below.       Follow Up Recommendations SNF;Supervision/Assistance - 24 hour    Equipment Recommendations  None recommended by PT    Recommendations for Other Services       Precautions / Restrictions Precautions Precautions: Fall Restrictions Weight Bearing Restrictions: Yes RLE Weight Bearing: Weight bearing as tolerated(on "heel only" per MD note POD1)      Mobility  Bed  Mobility Overal bed mobility: Needs Assistance       Supine to sit: Supervision;HOB elevated        Transfers Overall transfer level: Needs assistance Equipment used: Rolling walker (2 wheeled) Transfers: Sit to/from Stand Sit to Stand: Mod assist;From elevated surface         General transfer comment: Mod A for power up from elevated surface  Ambulation/Gait Ambulation/Gait assistance: (pivot only d/t dgiddiness adn orthosatic BP )              Stairs            Wheelchair Mobility    Modified Rankin (Stroke Patients Only)       Balance Overall balance assessment: Modified Independent;Mild deficits observed, not formally tested                                           Pertinent Vitals/Pain Pain Assessment: 0-10 Pain Score: 3  Pain Location: Right foot, surgical site  Pain Intervention(s): Limited activity within patient's tolerance;Monitored during session;Repositioned    Home Living Family/patient expects to be discharged to:: Private residence Living Arrangements: Alone;Other (Comment)(lives in guest house behind his ex-wife's house.) Available Help at Discharge: Family(exwife and son who has bipolar and likely no dependable help) Type of Home: House Home Access: Stairs to enter Entrance Stairs-Rails: None Entrance Stairs-Number of Steps: 2 Home Layout: One level Home Equipment: Cane - single point;Walker - 2 wheels      Prior Function Level of Independence: Independent with assistive device(s)         Comments:  Household ambulation mostly without an AD, sometimes using SPC, limited mobility d/t vascular insufficicency x4+ years     Hand Dominance        Extremity/Trunk Assessment                Communication   Communication: No difficulties  Cognition Arousal/Alertness: Awake/alert Behavior During Therapy: WFL for tasks assessed/performed Overall Cognitive Status: Within Functional Limits for tasks  assessed                                        General Comments      Exercises     Assessment/Plan    PT Assessment Patient needs continued PT services  PT Problem List Decreased strength;Decreased activity tolerance;Decreased balance;Decreased mobility       PT Treatment Interventions Gait training;Stair training;Functional mobility training;Therapeutic activities;Therapeutic exercise;Patient/family education    PT Goals (Current goals can be found in the Care Plan section)  Acute Rehab PT Goals Patient Stated Goal: return to his guest house  PT Goal Formulation: With patient Time For Goal Achievement: 08/18/17 Potential to Achieve Goals: Good    Frequency Min 4X/week   Barriers to discharge Decreased caregiver support;Inaccessible home environment      Co-evaluation               AM-PAC PT "6 Clicks" Daily Activity  Outcome Measure Difficulty turning over in bed (including adjusting bedclothes, sheets and blankets)?: A Lot Difficulty moving from lying on back to sitting on the side of the bed? : Unable Difficulty sitting down on and standing up from a chair with arms (e.g., wheelchair, bedside commode, etc,.)?: Unable Help needed moving to and from a bed to chair (including a wheelchair)?: Total Help needed walking in hospital room?: Total Help needed climbing 3-5 steps with a railing? : Total 6 Click Score: 7    End of Session   Activity Tolerance: Treatment limited secondary to medical complications (Comment)(low bp) Patient left: with call bell/phone within reach;with nursing/sitter in room;in chair Nurse Communication: Mobility status PT Visit Diagnosis: Unsteadiness on feet (R26.81);Other abnormalities of gait and mobility (R26.89);Muscle weakness (generalized) (M62.81)    Time: 1610-9604 PT Time Calculation (min) (ACUTE ONLY): 26 min   Charges:   PT Evaluation $PT Eval Moderate Complexity: 1 Mod PT Treatments $Therapeutic  Activity: 8-22 mins   PT G Codes:        12:31 PM, 08-25-2017 Rosamaria Lints, PT, DPT Physical Therapist - [place] [phone] (Pager)  [phone] (Office)     Jorge Clements 08-25-17, 12:26 PM

## 2017-08-19 NOTE — Progress Notes (Signed)
  Progress Note    08/19/2017 8:21 AM 1 Day Post-Op  Subjective: Complains of mild right lower extremity pain this morning  Vitals:   08/19/17 0446 08/19/17 0747  BP: (!) 110/50 (!) 106/53  Pulse: 67 66  Resp:  17  Temp: 97.9 F (36.6 C) 98.7 F (37.1 C)  SpO2:  100%    Physical Exam: Awake alert and oriented Right lower extremity incisions are clean dry and intact Strong anterior tibial, peroneal, posterior tibial signal on the foot Second and third toe amputation site on the right with clean base, no further purulence, metatarsal heads are evident within the wound   CBC    Component Value Date/Time   WBC 7.9 08/18/2017 1642   RBC 3.74 (L) 08/18/2017 1642   HGB 11.0 (L) 08/18/2017 1642   HCT 33.8 (L) 08/18/2017 1642   HCT 32.8 (L) 08/12/2017 0822   PLT 204 08/18/2017 1642   MCV 90.4 08/18/2017 1642   MCH 29.4 08/18/2017 1642   MCHC 32.5 08/18/2017 1642   RDW 14.9 08/18/2017 1642   LYMPHSABS 1.1 08/17/2017 0433   MONOABS 0.4 08/17/2017 0433   EOSABS 0.1 08/17/2017 0433   BASOSABS 0.0 08/17/2017 0433    BMET    Component Value Date/Time   NA 137 08/17/2017 0433   K 3.5 08/17/2017 0433   CL 102 08/17/2017 0433   CO2 27 08/17/2017 0433   GLUCOSE 105 (H) 08/17/2017 0433   BUN 6 08/17/2017 0433   CREATININE 0.77 08/18/2017 1642   CALCIUM 8.2 (L) 08/17/2017 0433   GFRNONAA >60 08/18/2017 1642   GFRAA >60 08/18/2017 1642    INR    Component Value Date/Time   INR 1.13 08/17/2017 0903     Intake/Output Summary (Last 24 hours) at 08/19/2017 0821 Last data filed at 08/19/2017 0447 Gross per 24 hour  Intake 3835.17 ml  Output 3340 ml  Net 495.17 ml     Assessment:  72 y.o. male is s/p right femoral to above-knee popliteal artery bypass with ipsilateral reverse greater saphenous vein and right second third toe amputation for wet gangrene  Plan: Wet-to-dry dressings to foot, have discussed case with Dr. Lajoyce Corners and will need further surgery prior to discharge   Okay for out of bed as tolerated with weightbearing on the heel only on the right side Aspirin from vascular standpoint and does not need anticoagulation beyond prophylaxis.   Derward Marple C. Randie Heinz, MD [facility] [place] Office: [phone] Pager: [phone]  08/19/2017 8:21 AM

## 2017-08-19 NOTE — Progress Notes (Signed)
Pharmacy Antibiotic Note  Jorge Clements is a 72 y.o. male admitted on 08/11/2017 with non-healing foot wound with cellulitis/abscess.  Pharmacy has been consulted for Ceftriaxone dosing. Already following for Vancomycin dosing. Clindamycin was discontinued.   Note allergy to Penicillins as hives.  ID recommending Ceftriaxone - low cross-reactivity, will monitor for reaction.    Plan: Ceftriaxone 1g IV every 24 hours.  Continue Vancomycin 1g IV every 12 hours as previously ordered.  Monitor for allergic reaction.   Height: 6' 0.99" (185.4 cm) Weight: 191 lb 12.8 oz (87 kg) IBW/kg (Calculated) : 79.88  Temp (24hrs), Avg:98.6 F (37 C), Min:97.9 F (36.6 C), Max:98.9 F (37.2 C)  Recent Labs  Lab 08/13/17 0435 08/15/17 0402 08/17/17 0433 08/17/17 0903 08/18/17 1642 08/19/17 1139 08/19/17 1408  WBC 13.2* 7.1 4.9  --  7.9  --  7.2  CREATININE  --  0.79 0.66  --  0.77 0.79  --   VANCOTROUGH  --   --   --  12*  --   --   --     Estimated Creatinine Clearance: 98.5 mL/min (by C-G formula based on SCr of 0.79 mg/dL).    Allergies  Allergen Reactions  . Penicillins Hives    Childhood reaction Has patient had a PCN reaction causing immediate rash, facial/tongue/throat swelling, SOB or lightheadedness with hypotension: No Has patient had a PCN reaction causing severe rash involving mucus membranes or skin necrosis: Yes Has patient had a PCN reaction that required hospitalization: Unknown Has patient had a PCN reaction occurring within the last 10 years: No If all of the above answers are "NO", then may proceed with Cephalosporin use.    Antimicrobials this admission: Vancomycin 4/27>> Clindamycin 4/25>>5/3 Ceftriaxone 5/3 >>   Dose adjustments this admission:   Microbiology results: 4/25 MRSA PCR: negative  Thank you for allowing pharmacy to be a part of this patient's care.  Link Snuffer, PharmD, BCPS, BCCCP Clinical Pharmacist Clinical phone 08/19/2017 until  11PM [phone] After hours, please call #[phone] 08/19/2017 4:16 PM

## 2017-08-20 LAB — CBC
HCT: 26.2 % — ABNORMAL LOW (ref 39.0–52.0)
Hemoglobin: 8.9 g/dL — ABNORMAL LOW (ref 13.0–17.0)
MCH: 30.5 pg (ref 26.0–34.0)
MCHC: 34 g/dL (ref 30.0–36.0)
MCV: 89.7 fL (ref 78.0–100.0)
PLATELETS: 190 10*3/uL (ref 150–400)
RBC: 2.92 MIL/uL — ABNORMAL LOW (ref 4.22–5.81)
RDW: 15 % (ref 11.5–15.5)
WBC: 4.9 10*3/uL (ref 4.0–10.5)

## 2017-08-20 MED ORDER — CLINDAMYCIN PHOSPHATE 900 MG/50ML IV SOLN
900.0000 mg | INTRAVENOUS | Status: DC
  Filled 2017-08-20: qty 50

## 2017-08-20 MED ORDER — CHLORHEXIDINE GLUCONATE 4 % EX LIQD
60.0000 mL | Freq: Once | CUTANEOUS | Status: AC
  Administered 2017-08-21: 4 via TOPICAL
  Filled 2017-08-20: qty 60

## 2017-08-20 MED ORDER — MUPIROCIN 2 % EX OINT
1.0000 "application " | TOPICAL_OINTMENT | Freq: Two times a day (BID) | CUTANEOUS | Status: DC
  Administered 2017-08-21 – 2017-08-22 (×2): 1 via NASAL
  Filled 2017-08-20: qty 22

## 2017-08-20 MED ORDER — POVIDONE-IODINE 10 % EX SWAB: 2.0000 "application " | Freq: Once | CUTANEOUS | Status: DC

## 2017-08-20 NOTE — H&P (View-Only) (Signed)
Patient ID: Jorge Clements, male   DOB: 08/31/1945, 71 y.o.   MRN: [id number] Discussed with the patient proceeding with the transmetatarsal amputation right foot.  Risks and benefits were discussed.  Patient states he understands wishes to proceed at this time will plan for a right transmetatarsal amputation tomorrow Sunday morning n.p.o. after midnight. 

## 2017-08-20 NOTE — Progress Notes (Addendum)
[facility]   Subjective  - Doing OK today.   Objective (!) 118/46 66 98.1 F (36.7 C) (Oral) 18 100%  Intake/Output Summary (Last 24 hours) at 08/20/2017 0752 Last data filed at 08/20/2017 0525 Gross per 24 hour  Intake -  Output 2280 ml  Net -2280 ml    Groin incisions healing well without hematoma, surrounding tissue healthy appearing and soft to touch. Doppler brisk PT and AT.  Foot dressing in place. Heart RRR Lungs non labored breathing  08/19/2017  RIGHT    LEFT    PRESSURE WAVEFORM  PRESSURE WAVEFORM  BRACHIAL 119 triphasic BRACHIAL 124 triphasic  DP 83 Monophasic DP 81 biphasic  AT   AT    PT 77 biphasic PT 76 triphasic  PER   PER    GREAT TOE  NA GREAT TOE  NA    RIGHT LEFT  ABI 0.67 0.65    Pre-op ABI demonstrated right LE 38%  Assessment/Planning: POD #  2 72 y.o. male is s/p right femoral to above-knee popliteal artery bypass with ipsilateral reverse greater saphenous vein and right second third toe amputation for wet gangrene  S/P post previous second and third toe amputations involving the metatarsals.  Non healing wounds right foot.  Dr. Lajoyce Corners has plans to perform transmetatarsal amputation this coming week now that he has improved arterial flow to the right foot.  ABI's above show improvement from 38 % to 67 % on the right LE.    He will continue IV antibiotics until discontinued post operatively for cellulitis of the right foot.  Heparin SQ for DVT prophylaxis and 325 mg Asa daily.    Jorge Clements 08/20/2017 7:52 AM --  Agree with above.  Doppler signals in feet.  ABI improved TMA Dr Lajoyce Corners next week  Fabienne Bruns, MD [facility] [place] Office: [phone] Pager: [phone]   Laboratory Lab Results: Recent Labs    08/19/17 1408 08/20/17 0343  WBC 7.2 4.9  HGB 9.5* 8.9*  HCT 28.7* 26.2*  PLT 192 190   BMET Recent Labs    08/18/17 1642  08/19/17 1139  NA  --  138  K  --  4.9  CL  --  106  CO2  --  20*  GLUCOSE  --  123*  BUN  --  11  CREATININE 0.77 0.79  CALCIUM  --  8.1*    COAG Lab Results  Component Value Date   INR 1.13 08/17/2017   No results found for: PTT

## 2017-08-20 NOTE — Progress Notes (Signed)
Pharmacy alerted [place] that Pt is allergic to Clindamycin. Dr. Lajoyce Corners notified and new order received.

## 2017-08-20 NOTE — Progress Notes (Addendum)
PROGRESS NOTE   Jorge Clements  ZOX:[id number]    DOB: 02-Jan-1946    DOA: 08/11/2017  PCP: Pearson Grippe, MD   I have briefly reviewed patients previous medical records in [facility].  Brief Narrative:  72 year old male with PMH of COPD, HTN, PAD status post stenting of both legs presented to [facility] ED on 08/11/2017 with weakness ongoing for quite some time but worse in the 2-3 days PTA, did not have enough strength to get up and ambulate and on the evening of admission fell to his right side after tripping on something, denied hitting his head but did sustain laceration of right arm, pain and swelling of his right foot and calf.  He was admitted for cellulitis of his right leg complicating nonhealing wound.  Orthopedics and vascular surgery was consulted.  Status post right common femoral >popliteal bypass and amputation of right second and third toes by vascular surgery on 5/2.  Orthopedics plans right transmetatarsal amputation 5/5.  Discussed with ID 5/3 and adjusted antibiotics.   Assessment & Plan:   Principal Problem:   Weakness Active Problems:   COPD (chronic obstructive pulmonary disease) (HCC)   Cellulitis of right lower extremity   Fall at home, initial encounter   Thrombocytopenia (HCC)   Weakness generalized   Hypokalemia   Essential hypertension   Pressure injury of skin   Infected blister of foot   Subacute osteomyelitis, right ankle and foot (HCC)   Cellulitis/abscess of right foot complicating PAD with nonhealing wound: Had been empirically started on IV clindamycin, vancomycin was subsequently added.  Venous Doppler negative for DVT.  Right ABI 0.38 and left ABI 0.68.  Vascular surgery was consulted and underwent angiogram but unsuccessful angioplasty of the right SFA.  Status post right common femoral > popliteal bypass and amputation of right second and third toes by vascular surgery on 5/2.  I discussed with infectious disease MD on call 5/3 who recommended changing  Clindamycin to ceftriaxone, continue vancomycin and could possibly discontinue all antibiotics 24 to 48 hours post surgery.  I discussed with Dr. Lajoyce Corners 5/4 and he plans for right TMA on 5/5.  Patient aware.  Generalized weakness with fall: CT head negative.  PT evaluated and recommends SNF.  TSH normal.  Folate normal.  DC to SNF probably early to mid next week.  COPD: Stable..  Former smoker but quit 4 years ago.  Elevated troponin: Minimal and flat trend.  Low index of suspicion for ACS.  Normocytic anemia: May have had mild acute blood loss anemia.  Hemoglobin is dropped from 11-9.5.  Relatively stable.  Hemoglobin 8.9.  Monitor CBCs and transfuse if hemoglobin <7 g per DL.  Thrombocytopenia: Resolved  Abnormal LFTs: Unclear etiology.  Improved.  Hypokalemia: Replaced.  Dehydration with hyponatremia: HCTZ and ARB held.  Resolved.  Essential hypertension: Currently controlled off of medications.  HCTZ and ARB on hold.  Resume when blood pressure starts to increase.   DVT prophylaxis: Subcutaneous heparin. Code Status: Full Family Communication: None at bedside Disposition: DC to SNF pending clinical improvement, sometime next week.   Consultants:  Vascular Orthopedics  Procedures:  Angiogram 5/1 Status post right femoropopliteal bypass and amputation of right second and third toes by vascular surgery on 5/2.  Antimicrobials:  IV clindamycin-discontinued 5/3 IV vancomycin > IV ceftriaxone 5/3 >.   Subjective: Mild and appropriate right postop foot pain.  Denies any other complaints.  As per RN, no acute issues reported.  ROS: As above.  Objective:  Vitals:   08/19/17 1200 08/19/17 1640 08/19/17 2000 08/20/17 0524  BP: (!) 113/47 (!) 114/47 (!) 118/46   Pulse: 65 69 66   Resp: 18 18    Temp: 98.5 F (36.9 C) 98.2 F (36.8 C) 99.1 F (37.3 C) 98.1 F (36.7 C)  TempSrc: Oral Oral Oral Oral  SpO2: 100%     Weight:      Height:        Examination: No change  in exam findings compared to 5/3.  General exam: Pleasant middle-aged male, moderately built and nourished lying comfortably propped up in bed. Respiratory system: Clear to auscultation. Respiratory effort normal.  Stable. Cardiovascular system: S1 & S2 heard, RRR. No JVD, murmurs, rubs, gallops or clicks. No pedal edema. Gastrointestinal system: Abdomen is nondistended, soft and nontender. No organomegaly or masses felt. Normal bowel sounds heard. stable. Central nervous system: Alert and oriented. No focal neurological deficits.  Stable. Extremities: Symmetric 5 x 5 power.  Right foot dressing clean and dry. Skin: No rashes, lesions or ulcers Psychiatry: Judgement and insight appear normal. Mood & affect appropriate.     Data Reviewed: I have personally reviewed following labs and imaging studies  CBC: Recent Labs  Lab 08/15/17 0402 08/17/17 0433 08/18/17 1642 08/19/17 1408 08/20/17 0343  WBC 7.1 4.9 7.9 7.2 4.9  NEUTROABS 4.9 3.3  --   --   --   HGB 10.0* 11.5* 11.0* 9.5* 8.9*  HCT 30.0* 34.4* 33.8* 28.7* 26.2*  MCV 88.0 89.1 90.4 88.9 89.7  PLT 123* 191 204 192 190   Basic Metabolic Panel: Recent Labs  Lab 08/15/17 0402 08/17/17 0433 08/18/17 1642 08/19/17 1139  NA 134* 137  --  138  K 3.5 3.5  --  4.9  CL 98* 102  --  106  CO2 28 27  --  20*  GLUCOSE 121* 105*  --  123*  BUN 12 6  --  11  CREATININE 0.79 0.66 0.77 0.79  CALCIUM 8.0* 8.2*  --  8.1*  MG 1.8 2.2  --   --    Liver Function Tests: Recent Labs  Lab 08/15/17 0402 08/17/17 0433  AST 139* 38  ALT 119* 64*  ALKPHOS 129* 103  BILITOT 0.6 0.8  PROT 5.6* 5.8*  ALBUMIN 2.1* 2.3*   Coagulation Profile: Recent Labs  Lab 08/17/17 0903  INR 1.13     Recent Results (from the past 240 hour(s))  MRSA PCR Screening     Status: None   Collection Time: 08/11/17  9:10 AM  Result Value Ref Range Status   MRSA by PCR NEGATIVE NEGATIVE Final    Comment:        The GeneXpert MRSA Assay (FDA approved  for NASAL specimens only), is one component of a comprehensive MRSA colonization surveillance program. It is not intended to diagnose MRSA infection nor to guide or monitor treatment for MRSA infections. Performed at [facility], [address] [place], [place] [address]          Radiology Studies: No results found.      Scheduled Meds: . aspirin  325 mg Oral Daily  . docusate sodium  100 mg Oral Daily  . heparin  5,000 Units Subcutaneous Q8H  . pantoprazole  40 mg Oral Daily  . rosuvastatin  10 mg Oral Daily  . sodium chloride flush  3 mL Intravenous Q12H   Continuous Infusions: . sodium chloride    . cefTRIAXone (ROCEPHIN)  IV Stopped (08/19/17 2116)  .  magnesium sulfate 1 - 4 g bolus IVPB    . vancomycin Stopped (08/20/17 1103)     LOS: 7 days     Marcellus Scott, MD, FACP, [facility]. [facility] Pager [phone]  If 7PM-7AM, please contact night-coverage [email or web address] Password [email or web address] 08/20/2017, 11:14 AM

## 2017-08-20 NOTE — Progress Notes (Signed)
Patient ID: Jorge Clements, male   DOB: 1945-10-18, 72 y.o.   MRN: [id number] Discussed with the patient proceeding with the transmetatarsal amputation right foot.  Risks and benefits were discussed.  Patient states he understands wishes to proceed at this time will plan for a right transmetatarsal amputation tomorrow Sunday morning n.p.o. after midnight.

## 2017-08-21 ENCOUNTER — Encounter (HOSPITAL_COMMUNITY): Payer: Self-pay | Admitting: Surgery

## 2017-08-21 ENCOUNTER — Inpatient Hospital Stay (HOSPITAL_COMMUNITY): Payer: Medicare Other | Admitting: Certified Registered"

## 2017-08-21 ENCOUNTER — Encounter (HOSPITAL_COMMUNITY)
Admission: EM | Disposition: A | Discharge status: Skilled Nursing Facility | Payer: Self-pay | Source: Home / Self Care | Attending: Internal Medicine

## 2017-08-21 HISTORY — PX: AMPUTATION: SHX166

## 2017-08-21 LAB — CBC
HCT: 25.8 % — ABNORMAL LOW (ref 39.0–52.0)
Hemoglobin: 8.4 g/dL — ABNORMAL LOW (ref 13.0–17.0)
MCH: 29.3 pg (ref 26.0–34.0)
MCHC: 32.6 g/dL (ref 30.0–36.0)
MCV: 89.9 fL (ref 78.0–100.0)
PLATELETS: 215 10*3/uL (ref 150–400)
RBC: 2.87 MIL/uL — ABNORMAL LOW (ref 4.22–5.81)
RDW: 15 % (ref 11.5–15.5)
WBC: 5.3 10*3/uL (ref 4.0–10.5)

## 2017-08-21 LAB — SURGICAL PCR SCREEN
MRSA, PCR: NEGATIVE
Staphylococcus aureus: NEGATIVE

## 2017-08-21 SURGERY — AMPUTATION, FOOT, RAY
Anesthesia: General | Site: Foot | Laterality: Right

## 2017-08-21 MED ORDER — LACTATED RINGERS IV SOLN
INTRAVENOUS | Status: DC
  Administered 2017-08-21: 08:00:00 via INTRAVENOUS

## 2017-08-21 MED ORDER — MIDAZOLAM HCL 5 MG/5ML IJ SOLN
INTRAMUSCULAR | Status: DC | PRN
  Administered 2017-08-21: 2 mg via INTRAVENOUS

## 2017-08-21 MED ORDER — HYDROMORPHONE HCL 1 MG/ML IJ SOLN: 0.5000 mg | INTRAMUSCULAR | Status: DC | PRN

## 2017-08-21 MED ORDER — LACTATED RINGERS IV SOLN
INTRAVENOUS | Status: DC
  Administered 2017-08-21: 10:00:00 via INTRAVENOUS

## 2017-08-21 MED ORDER — HYDROMORPHONE HCL 2 MG/ML IJ SOLN: 0.2500 mg | INTRAMUSCULAR | Status: DC | PRN

## 2017-08-21 MED ORDER — PROMETHAZINE HCL 25 MG/ML IJ SOLN: 6.2500 mg | INTRAMUSCULAR | Status: DC | PRN

## 2017-08-21 MED ORDER — DOCUSATE SODIUM 100 MG PO CAPS
100.0000 mg | ORAL_CAPSULE | Freq: Two times a day (BID) | ORAL | Status: DC
  Administered 2017-08-21 (×2): 100 mg via ORAL
  Filled 2017-08-21 (×3): qty 1

## 2017-08-21 MED ORDER — LIDOCAINE 2% (20 MG/ML) 5 ML SYRINGE
INTRAMUSCULAR | Status: AC
  Filled 2017-08-21: qty 5

## 2017-08-21 MED ORDER — DEXAMETHASONE SODIUM PHOSPHATE 10 MG/ML IJ SOLN
INTRAMUSCULAR | Status: DC | PRN
  Administered 2017-08-21: 5 mg via INTRAVENOUS

## 2017-08-21 MED ORDER — SODIUM CHLORIDE 0.9 % IV SOLN
INTRAVENOUS | Status: DC
  Administered 2017-08-21: 11:00:00 via INTRAVENOUS

## 2017-08-21 MED ORDER — MEPERIDINE HCL 50 MG/ML IJ SOLN: 6.2500 mg | INTRAMUSCULAR | Status: DC | PRN

## 2017-08-21 MED ORDER — HYDROMORPHONE HCL 2 MG/ML IJ SOLN
0.3000 mg | INTRAMUSCULAR | Status: DC | PRN
  Administered 2017-08-21 (×4): 0.5 mg via INTRAVENOUS

## 2017-08-21 MED ORDER — METHOCARBAMOL 500 MG PO TABS
ORAL_TABLET | ORAL | Status: AC
  Filled 2017-08-21: qty 1

## 2017-08-21 MED ORDER — METHOCARBAMOL 1000 MG/10ML IJ SOLN: 500.0000 mg | Freq: Four times a day (QID) | INTRAVENOUS | Status: DC | PRN

## 2017-08-21 MED ORDER — METOCLOPRAMIDE HCL 5 MG PO TABS: 5.0000 mg | ORAL_TABLET | Freq: Three times a day (TID) | ORAL | Status: DC | PRN

## 2017-08-21 MED ORDER — FENTANYL CITRATE (PF) 250 MCG/5ML IJ SOLN
INTRAMUSCULAR | Status: DC | PRN
  Administered 2017-08-21 (×2): 25 ug via INTRAVENOUS

## 2017-08-21 MED ORDER — ACETAMINOPHEN 325 MG PO TABS: 325.0000 mg | ORAL_TABLET | Freq: Four times a day (QID) | ORAL | Status: DC | PRN

## 2017-08-21 MED ORDER — PROPOFOL 10 MG/ML IV BOLUS
INTRAVENOUS | Status: DC | PRN
  Administered 2017-08-21: 200 mg via INTRAVENOUS

## 2017-08-21 MED ORDER — HYDROMORPHONE HCL 2 MG/ML IJ SOLN
INTRAMUSCULAR | Status: AC
  Administered 2017-08-21: 0.5 mg via INTRAVENOUS
  Filled 2017-08-21: qty 1

## 2017-08-21 MED ORDER — LIDOCAINE 2% (20 MG/ML) 5 ML SYRINGE
INTRAMUSCULAR | Status: DC | PRN
  Administered 2017-08-21: 50 mg via INTRAVENOUS

## 2017-08-21 MED ORDER — LACTATED RINGERS IV SOLN: INTRAVENOUS | Status: DC

## 2017-08-21 MED ORDER — ONDANSETRON HCL 4 MG/2ML IJ SOLN
INTRAMUSCULAR | Status: DC | PRN
  Administered 2017-08-21: 4 mg via INTRAVENOUS

## 2017-08-21 MED ORDER — HYDROMORPHONE HCL 2 MG/ML IJ SOLN
INTRAMUSCULAR | Status: AC
  Filled 2017-08-21: qty 1

## 2017-08-21 MED ORDER — OXYCODONE HCL 5 MG PO TABS
5.0000 mg | ORAL_TABLET | ORAL | Status: DC | PRN
  Administered 2017-08-21 – 2017-08-22 (×5): 10 mg via ORAL
  Filled 2017-08-21 (×5): qty 2

## 2017-08-21 MED ORDER — PROPOFOL 10 MG/ML IV BOLUS
INTRAVENOUS | Status: AC
  Filled 2017-08-21: qty 20

## 2017-08-21 MED ORDER — POLYETHYLENE GLYCOL 3350 17 G PO PACK: 17.0000 g | PACK | Freq: Every day | ORAL | Status: DC | PRN

## 2017-08-21 MED ORDER — EPHEDRINE SULFATE-NACL 50-0.9 MG/10ML-% IV SOSY
PREFILLED_SYRINGE | INTRAVENOUS | Status: DC | PRN
  Administered 2017-08-21: 10 mg via INTRAVENOUS
  Administered 2017-08-21: 15 mg via INTRAVENOUS

## 2017-08-21 MED ORDER — MIDAZOLAM HCL 2 MG/2ML IJ SOLN
INTRAMUSCULAR | Status: AC
  Filled 2017-08-21: qty 2

## 2017-08-21 MED ORDER — METHOCARBAMOL 500 MG PO TABS
500.0000 mg | ORAL_TABLET | Freq: Four times a day (QID) | ORAL | Status: DC | PRN
  Administered 2017-08-21: 500 mg via ORAL

## 2017-08-21 MED ORDER — FENTANYL CITRATE (PF) 250 MCG/5ML IJ SOLN
INTRAMUSCULAR | Status: AC
Start: 2017-08-21 — End: 2017-08-21
  Filled 2017-08-21: qty 5

## 2017-08-21 MED ORDER — OXYCODONE HCL 5 MG PO TABS: 10.0000 mg | ORAL_TABLET | ORAL | Status: DC | PRN

## 2017-08-21 MED ORDER — DEXAMETHASONE SODIUM PHOSPHATE 10 MG/ML IJ SOLN
INTRAMUSCULAR | Status: AC
  Filled 2017-08-21: qty 1

## 2017-08-21 MED ORDER — BISACODYL 10 MG RE SUPP: 10.0000 mg | Freq: Every day | RECTAL | Status: DC | PRN

## 2017-08-21 MED ORDER — ONDANSETRON HCL 4 MG PO TABS: 4.0000 mg | ORAL_TABLET | Freq: Four times a day (QID) | ORAL | Status: DC | PRN

## 2017-08-21 MED ORDER — 0.9 % SODIUM CHLORIDE (POUR BTL) OPTIME
TOPICAL | Status: DC | PRN
  Administered 2017-08-21: 1000 mL

## 2017-08-21 MED ORDER — ONDANSETRON HCL 4 MG/2ML IJ SOLN
INTRAMUSCULAR | Status: AC
  Filled 2017-08-21: qty 2

## 2017-08-21 MED ORDER — ONDANSETRON HCL 4 MG/2ML IJ SOLN: 4.0000 mg | Freq: Four times a day (QID) | INTRAMUSCULAR | Status: DC | PRN

## 2017-08-21 MED ORDER — MAGNESIUM CITRATE PO SOLN: 1.0000 | Freq: Once | ORAL | Status: DC | PRN

## 2017-08-21 MED ORDER — METOCLOPRAMIDE HCL 5 MG/ML IJ SOLN: 5.0000 mg | Freq: Three times a day (TID) | INTRAMUSCULAR | Status: DC | PRN

## 2017-08-21 SURGICAL SUPPLY — 32 items
BLADE SAW SGTL MED 73X18.5 STR (BLADE) ×1 IMPLANT
BLADE SURG 21 STRL SS (BLADE) ×2 IMPLANT
BNDG COHESIVE 4X5 TAN STRL (GAUZE/BANDAGES/DRESSINGS) ×2 IMPLANT
BNDG GAUZE ELAST 4 BULKY (GAUZE/BANDAGES/DRESSINGS) ×1 IMPLANT
CANISTER WOUND CARE 500ML ATS (WOUND CARE) ×1 IMPLANT
COVER SURGICAL LIGHT HANDLE (MISCELLANEOUS) ×4 IMPLANT
DRAPE U-SHAPE 47X51 STRL (DRAPES) ×3 IMPLANT
DRESSING PREVENA PLUS CUSTOM (GAUZE/BANDAGES/DRESSINGS) IMPLANT
DRSG ADAPTIC 3X8 NADH LF (GAUZE/BANDAGES/DRESSINGS) ×1 IMPLANT
DRSG PAD ABDOMINAL 8X10 ST (GAUZE/BANDAGES/DRESSINGS) ×2 IMPLANT
DRSG PREVENA PLUS CUSTOM (GAUZE/BANDAGES/DRESSINGS) ×2
DURAPREP 26ML APPLICATOR (WOUND CARE) ×2 IMPLANT
ELECT REM PT RETURN 9FT ADLT (ELECTROSURGICAL) ×2
ELECTRODE REM PT RTRN 9FT ADLT (ELECTROSURGICAL) ×1 IMPLANT
FACESHIELD WRAPAROUND (MASK) ×2 IMPLANT
FACESHIELD WRAPAROUND OR TEAM (MASK) IMPLANT
GAUZE SPONGE 4X4 12PLY STRL (GAUZE/BANDAGES/DRESSINGS) ×1 IMPLANT
GLOVE BIOGEL PI IND STRL 9 (GLOVE) ×1 IMPLANT
GLOVE BIOGEL PI INDICATOR 9 (GLOVE) ×1
GLOVE SURG ORTHO 9.0 STRL STRW (GLOVE) ×2 IMPLANT
GOWN STRL REUS W/ TWL XL LVL3 (GOWN DISPOSABLE) ×2 IMPLANT
GOWN STRL REUS W/TWL XL LVL3 (GOWN DISPOSABLE) ×4
KIT BASIN OR (CUSTOM PROCEDURE TRAY) ×2 IMPLANT
KIT TURNOVER KIT B (KITS) ×2 IMPLANT
NS IRRIG 1000ML POUR BTL (IV SOLUTION) ×2 IMPLANT
PACK ORTHO EXTREMITY (CUSTOM PROCEDURE TRAY) ×2 IMPLANT
PAD ARMBOARD 7.5X6 YLW CONV (MISCELLANEOUS) ×4 IMPLANT
STOCKINETTE IMPERVIOUS LG (DRAPES) ×1 IMPLANT
SUT ETHILON 2 0 PSLX (SUTURE) ×3 IMPLANT
TOWEL OR 17X26 10 PK STRL BLUE (TOWEL DISPOSABLE) ×2 IMPLANT
TUBE CONNECTING 12X1/4 (SUCTIONS) ×2 IMPLANT
YANKAUER SUCT BULB TIP NO VENT (SUCTIONS) ×2 IMPLANT

## 2017-08-21 NOTE — Interval H&P Note (Signed)
History and Physical Interval Note:  08/21/2017 7:40 AM  Jorge Clements  has presented today for surgery, with the diagnosis of right foot ulcer  The various methods of treatment have been discussed with the patient and family. After consideration of risks, benefits and other options for treatment, the patient has consented to  Procedure(s): TRANSMETATARSAL AMPUTATION RIGHT FOOT (Right) as a surgical intervention .  The patient's history has been reviewed, patient examined, no change in status, stable for surgery.  I have reviewed the patient's chart and labs.  Questions were answered to the patient's satisfaction.     Nadara Mustard

## 2017-08-21 NOTE — Progress Notes (Addendum)
[facility] [employer]  Subjective  - Doing OK over all.   Objective (!) 119/52 70 98.2 F (36.8 C) (Oral) 16 98%  Intake/Output Summary (Last 24 hours) at 08/21/2017 0736 Last data filed at 08/21/2017 0700 Gross per 24 hour  Intake 1430 ml  Output 2525 ml  Net -1095 ml    Doppler PT/AT brisk signals. Right LE incisions healing well.  Right groin soft without hematoma. Herat RRR Lungs non labored breathing Gen NAD  Assessment/Planning: POD #  3 72 y.o.maleis s/pright femoral to above-knee popliteal artery bypass with ipsilateral reverse greater saphenous vein and right second third toe amputation for wet gangrene   NPO this am in preporation for transmetatarsal amputation by Dr. Lajoyce Corners He should have enough blood flow at this time to heal the amputation.   ABI's above show improvement from 38 % to 67 % on the right LE.   He will continue IV antibiotics until discontinued post operatively for cellulitis of the right foot.  Heparin SQ for DVT prophylaxis and 325 mg Asa daily.     Jorge Clements 08/21/2017 7:36 AM --  Pt to OR with Lajoyce Corners today.  Fabienne Bruns, MD [facility] [place] Office: [phone] Pager: [phone]  Laboratory Lab Results: Recent Labs    08/20/17 0343 08/21/17 0300  WBC 4.9 5.3  HGB 8.9* 8.4*  HCT 26.2* 25.8*  PLT 190 215   BMET Recent Labs    08/18/17 1642 08/19/17 1139  NA  --  138  K  --  4.9  CL  --  106  CO2  --  20*  GLUCOSE  --  123*  BUN  --  11  CREATININE 0.77 0.79  CALCIUM  --  8.1*    COAG Lab Results  Component Value Date   INR 1.13 08/17/2017   No results found for: PTT

## 2017-08-21 NOTE — Progress Notes (Signed)
Orthopedic Tech Progress Note Patient Details:  Jorge Clements 1946-03-30 [id number]  Ortho Devices Type of Ortho Device: Postop shoe/boot   Post Interventions Patient Tolerated: Well Instructions Provided: Care of device   [name] 08/21/2017, 12:05 PM

## 2017-08-21 NOTE — Progress Notes (Signed)
PROGRESS NOTE   Jorge Clements  WJX:[id number]    DOB: 19-Sep-1945    DOA: 08/11/2017  PCP: Pearson Grippe, MD   I have briefly reviewed patients previous medical records in [facility].  Brief Narrative:  72 year old male with PMH of COPD, HTN, PAD status post stenting of both legs presented to [facility] ED on 08/11/2017 with weakness ongoing for quite some time but worse in the 2-3 days PTA, did not have enough strength to get up and ambulate and on the evening of admission fell to his right side after tripping on something, denied hitting his head but did sustain laceration of right arm, pain and swelling of his right foot and calf.  He was admitted for cellulitis of his right leg complicating nonhealing wound.  Orthopedics and vascular surgery was consulted.  Status post right common femoral >popliteal bypass and amputation of right second and third toes by vascular surgery on 5/2.  Status post right transmetatarsal amputation 5/5.  Discussed with ID 5/3 and adjusted antibiotics.   Assessment & Plan:   Principal Problem:   Weakness Active Problems:   COPD (chronic obstructive pulmonary disease) (HCC)   Cellulitis of right lower extremity   Fall at home, initial encounter   Thrombocytopenia (HCC)   Weakness generalized   Hypokalemia   Essential hypertension   Pressure injury of skin   Infected blister of foot   Subacute osteomyelitis, right ankle and foot (HCC)   Cellulitis/abscess of right foot complicating PAD with nonhealing wound and gangrene of right forefoot: Had been empirically started on IV clindamycin, vancomycin was subsequently added.  Venous Doppler negative for DVT.  Right ABI 0.38 and left ABI 0.68.  Vascular surgery was consulted and underwent angiogram but unsuccessful angioplasty of the right SFA.  Status post right common femoral > popliteal bypass and amputation of right second and third toes by vascular surgery on 5/2.  I discussed with infectious disease MD on call 5/3 who  recommended changing Clindamycin to ceftriaxone, continue vancomycin and could possibly discontinue all antibiotics 24 to 48 hours post surgery.  Status post trans-metatarsal amputation with wound VAC application 5/5.  As per orthopedics: Discontinue antibiotics 24 hours postop, nonweightbearing right lower extremity for 3 weeks, wound VAC for 1 week, home when safe with ambulation (will be going to SNF) and outpatient follow-up in office in 1 week.  Generalized weakness with fall: CT head negative.  PT evaluated and recommends SNF.  TSH normal.  Folate normal.  DC to SNF probably early to mid next week.  COPD: Stable..  Former smoker but quit 4 years ago.  Elevated troponin: Minimal and flat trend.  Low index of suspicion for ACS.  Normocytic anemia: May have had mild acute blood loss anemia.  Hemoglobin is dropped from 11-9.5.  Relatively stable.  Hemoglobin stable in the 8.9-8.4 range.  Follow postop CBC in a.m.  Thrombocytopenia: Resolved  Abnormal LFTs: Unclear etiology.  Improved.  Hypokalemia: Replaced.  Dehydration with hyponatremia: HCTZ and ARB held.  Resolved.  Essential hypertension: Currently controlled off of medications.  HCTZ and ARB on hold.  Resume when blood pressure starts to increase.   DVT prophylaxis: Subcutaneous heparin. Code Status: Full Family Communication: None at bedside Disposition: DC to SNF pending clinical improvement, sometime next week.   Consultants:  Vascular Orthopedics  Procedures:  Angiogram 5/1 Status post right femoropopliteal bypass and amputation of right second and third toes by vascular surgery on 5/2. Right transmetatarsal amputation 5/5.  Antimicrobials:  IV  clindamycin-discontinued 5/3 IV vancomycin > IV ceftriaxone 5/3 >.   Subjective: Seen postoperatively this afternoon.  Denies complaints.  "I have to wait till it heals".  ROS: As above.  Objective:  Vitals:   08/21/17 1035 08/21/17 1050 08/21/17 1103 08/21/17 1238   BP: (!) 119/51 (!) 113/51 (!) 108/52 (!) 100/54  Pulse: 62 64 61   Resp: 12 14    Temp:  98.4 F (36.9 C) 98.9 F (37.2 C)   TempSrc:   Oral   SpO2: 96% 96% 95%   Weight:      Height:        Examination:   General exam: Pleasant middle-aged male, moderately built and nourished lying comfortably propped up in bed. Respiratory system: Clear to auscultation. Respiratory effort normal.  Stable. Cardiovascular system: S1 & S2 heard, RRR. No JVD, murmurs, rubs, gallops or clicks. No pedal edema. Gastrointestinal system: Abdomen is nondistended, soft and nontender. No organomegaly or masses felt. Normal bowel sounds heard. stable. Central nervous system: Alert and oriented. No focal neurological deficits.  Stable. Extremities: Symmetric 5 x 5 power.  Right forefoot with wound VAC.  No acute findings. Skin: No rashes, lesions or ulcers Psychiatry: Judgement and insight appear normal. Mood & affect appropriate.     Data Reviewed: I have personally reviewed following labs and imaging studies  CBC: Recent Labs  Lab 08/15/17 0402 08/17/17 0433 08/18/17 1642 08/19/17 1408 08/20/17 0343 08/21/17 0300  WBC 7.1 4.9 7.9 7.2 4.9 5.3  NEUTROABS 4.9 3.3  --   --   --   --   HGB 10.0* 11.5* 11.0* 9.5* 8.9* 8.4*  HCT 30.0* 34.4* 33.8* 28.7* 26.2* 25.8*  MCV 88.0 89.1 90.4 88.9 89.7 89.9  PLT 123* 191 204 192 190 215   Basic Metabolic Panel: Recent Labs  Lab 08/15/17 0402 08/17/17 0433 08/18/17 1642 08/19/17 1139  NA 134* 137  --  138  K 3.5 3.5  --  4.9  CL 98* 102  --  106  CO2 28 27  --  20*  GLUCOSE 121* 105*  --  123*  BUN 12 6  --  11  CREATININE 0.79 0.66 0.77 0.79  CALCIUM 8.0* 8.2*  --  8.1*  MG 1.8 2.2  --   --    Liver Function Tests: Recent Labs  Lab 08/15/17 0402 08/17/17 0433  AST 139* 38  ALT 119* 64*  ALKPHOS 129* 103  BILITOT 0.6 0.8  PROT 5.6* 5.8*  ALBUMIN 2.1* 2.3*   Coagulation Profile: Recent Labs  Lab 08/17/17 0903  INR 1.13     Recent  Results (from the past 240 hour(s))  Surgical PCR screen     Status: None   Collection Time: 08/20/17 10:38 PM  Result Value Ref Range Status   MRSA, PCR NEGATIVE NEGATIVE Final   Staphylococcus aureus NEGATIVE NEGATIVE Final    Comment: (NOTE) The Xpert SA Assay (FDA approved for NASAL specimens in patients 31 years of age and older), is one component of a comprehensive surveillance program. It is not intended to diagnose infection nor to guide or monitor treatment. Performed at [facility], [address], [place], [place] [address]          Radiology Studies: No results found.      Scheduled Meds: . aspirin  325 mg Oral Daily  . docusate sodium  100 mg Oral BID  . heparin  5,000 Units Subcutaneous Q8H  . methocarbamol      .  mupirocin ointment  1 application Nasal BID  . pantoprazole  40 mg Oral Daily  . rosuvastatin  10 mg Oral Daily  . sodium chloride flush  3 mL Intravenous Q12H   Continuous Infusions: . sodium chloride    . sodium chloride 10 mL/hr at 08/21/17 1112  . cefTRIAXone (ROCEPHIN)  IV Stopped (08/20/17 2052)  . lactated ringers 10 mL/hr at 08/21/17 0824  . magnesium sulfate 1 - 4 g bolus IVPB    . methocarbamol (ROBAXIN)  IV    . vancomycin 1,000 mg (08/21/17 1143)     LOS: 8 days     Marcellus Scott, MD, FACP, [facility]. [facility] Pager [phone]  If 7PM-7AM, please contact night-coverage [email or web address] Password [email or web address] 08/21/2017, 1:11 PM

## 2017-08-21 NOTE — Progress Notes (Signed)
Call to [facility] (ortho tech)- pt has" not worn shoes in 1 year, I can't remember my shoe size"- R foot measured in length - 8 inches- info relayed to [place] via phone

## 2017-08-21 NOTE — Progress Notes (Signed)
Pt received from PACU. VSS. Lunch tray ordered. Wound vac with good seal with suction @ 100 mmHg on R foot. Pulses dopplerable. Ortho shoe delivered to room and placed in closet.  Versie Starks, RN

## 2017-08-21 NOTE — Anesthesia Preprocedure Evaluation (Addendum)
Anesthesia Evaluation  Patient identified by MRN, date of birth, ID band Patient awake    Reviewed: Allergy & Precautions, NPO status , Patient's Chart, lab work & pertinent test results  Airway Mallampati: I  TM Distance: >3 FB Neck ROM: Full    Dental  (+) Edentulous Upper, Edentulous Lower   Pulmonary COPD, former smoker,     + decreased breath sounds      Cardiovascular hypertension, Pt. on medications  Rhythm:Regular Rate:Normal     Neuro/Psych negative neurological ROS  negative psych ROS   GI/Hepatic negative GI ROS, Neg liver ROS,   Endo/Other  negative endocrine ROS  Renal/GU negative Renal ROS     Musculoskeletal negative musculoskeletal ROS (+)   Abdominal Normal abdominal exam  (+)   Peds  Hematology negative hematology ROS (+)   Anesthesia Other Findings   Reproductive/Obstetrics                            Lab Results  Component Value Date   WBC 5.3 08/21/2017   HGB 8.4 (L) 08/21/2017   HCT 25.8 (L) 08/21/2017   MCV 89.9 08/21/2017   PLT 215 08/21/2017   Lab Results  Component Value Date   CREATININE 0.79 08/19/2017   BUN 11 08/19/2017   NA 138 08/19/2017   K 4.9 08/19/2017   CL 106 08/19/2017   CO2 20 (L) 08/19/2017   Lab Results  Component Value Date   INR 1.13 08/17/2017   EKG: normal sinus rhythm, PAC's noted.   Anesthesia Physical Anesthesia Plan  ASA: III  Anesthesia Plan: General   Post-op Pain Management:    Induction: Intravenous  PONV Risk Score and Plan: 3 and Ondansetron, Dexamethasone and Midazolam  Airway Management Planned: LMA  Additional Equipment: None  Intra-op Plan:   Post-operative Plan: Extubation in OR  Informed Consent: I have reviewed the patients History and Physical, chart, labs and discussed the procedure including the risks, benefits and alternatives for the proposed anesthesia with the patient or authorized  representative who has indicated his/her understanding and acceptance.   Dental advisory given  Plan Discussed with: CRNA  Anesthesia Plan Comments:        Anesthesia Quick Evaluation

## 2017-08-21 NOTE — Anesthesia Postprocedure Evaluation (Signed)
Anesthesia Post Note  Patient: Jorge Clements  Procedure(s) Performed: TRANSMETATARSAL AMPUTATION RIGHT FOOT (Right Foot)     Patient location during evaluation: PACU Anesthesia Type: General Level of consciousness: awake and alert Pain management: pain level controlled Vital Signs Assessment: post-procedure vital signs reviewed and stable Respiratory status: spontaneous breathing, nonlabored ventilation, respiratory function stable and patient connected to nasal cannula oxygen Cardiovascular status: blood pressure returned to baseline and stable Postop Assessment: no apparent nausea or vomiting Anesthetic complications: no    Last Vitals:  Vitals:   08/21/17 1035 08/21/17 1050  BP: (!) 119/51 (!) 113/51  Pulse: 62 64  Resp: 12 14  Temp:  36.9 C  SpO2: 96% 96%    Last Pain:  Vitals:   08/21/17 1050  TempSrc:   PainSc: 2                  Shelton Silvas

## 2017-08-21 NOTE — Transfer of Care (Signed)
Immediate Anesthesia Transfer of Care Note  Patient: Jorge Clements  Procedure(s) Performed: TRANSMETATARSAL AMPUTATION RIGHT FOOT (Right Foot)  Patient Location: PACU  Anesthesia Type:General  Level of Consciousness: awake, alert , oriented and patient cooperative  Airway & Oxygen Therapy: Patient Spontanous Breathing and Patient connected to nasal cannula oxygen  Post-op Assessment: Report given to RN, Post -op Vital signs reviewed and stable and Patient moving all extremities  Post vital signs: Reviewed and stable  Last Vitals:  Vitals Value Taken Time  BP    Temp    Pulse 83 08/21/2017  9:51 AM  Resp 13 08/21/2017  9:51 AM  SpO2 100 % 08/21/2017  9:51 AM  Vitals shown include unvalidated device data.  Last Pain:  Vitals:   08/21/17 0834  TempSrc:   PainSc: 0-No pain      Patients Stated Pain Goal: 0 (08/18/17 2100)  Complications: No apparent anesthesia complications

## 2017-08-21 NOTE — Op Note (Signed)
     Date of Surgery: 08/21/2017  INDICATIONS: Mr. Jorge Clements is a 72 y.o.-year-old male who is status post revascularization to the right lower extremity with partial resection for abscess osteomyelitis second and third rays.Marland Kitchen  PREOPERATIVE DIAGNOSIS: Gangrene right forefoot status post revascularization.  POSTOPERATIVE DIAGNOSIS: Same.  PROCEDURE: Transmetatarsal amputation Application of Prevena wound VAC  SURGEON: Lajoyce Corners, M.D.  ANESTHESIA:  general  IV FLUIDS AND URINE: See anesthesia.  ESTIMATED BLOOD LOSS: 100 mL.  COMPLICATIONS: None.  DESCRIPTION OF PROCEDURE: The patient was brought to the operating room and underwent a general anesthetic. After adequate levels of anesthesia were obtained patient's lower extremity was prepped using DuraPrep draped into a sterile field. A timeout was called.  A fishmouth incision was made just proximal to the ulcerative nonviable tissue. This was carried sharply down to bone. A oscillating saw was used to perform a transmetatarsal amputation with a gentle cascade of the metatarsals and beveled plantarly. Electrocautery was used for hemostasis. The wound was irrigated with normal saline. The incision was closed using 2-0 nylon. A Prevena wound VAC was applied. This had a good suction fit. Patient was taken to the PACU in stable condition.   DISCHARGE PLANNING:  Antibiotic duration: 24 hours postoperatively  Weightbearing: Nonweightbearing right lower extremity for 3 weeks  Pain medication: Ordered  Dressing care/ Wound VAC: Continue wound VAC for 1 week after discharge the portable Praveena plus pump can be obtained from the operating room.  Discharge to: Home once safe with ambulation.  Follow-up: In the office 1 week post operative.  Aldean Baker, MD [facility] 9:49 AM

## 2017-08-21 NOTE — Anesthesia Procedure Notes (Signed)
Procedure Name: LMA Insertion Date/Time: 08/21/2017 9:07 AM Performed by: Lucinda Dell, CRNA Pre-anesthesia Checklist: Patient identified, Emergency Drugs available, Suction available and Patient being monitored Patient Re-evaluated:Patient Re-evaluated prior to induction Oxygen Delivery Method: Circle system utilized Preoxygenation: Pre-oxygenation with 100% oxygen Induction Type: IV induction LMA: LMA inserted LMA Size: 4.0 Tube type: Oral Number of attempts: 1 Placement Confirmation: positive ETCO2 and breath sounds checked- equal and bilateral Tube secured with: Tape Dental Injury: Teeth and Oropharynx as per pre-operative assessment

## 2017-08-22 ENCOUNTER — Encounter (HOSPITAL_COMMUNITY): Payer: Self-pay | Admitting: Orthopedic Surgery

## 2017-08-22 DIAGNOSIS — Z89431 Acquired absence of right foot: Secondary | ICD-10-CM

## 2017-08-22 LAB — CBC
HCT: 26.7 % — ABNORMAL LOW (ref 39.0–52.0)
Hemoglobin: 8.6 g/dL — ABNORMAL LOW (ref 13.0–17.0)
MCH: 29.3 pg (ref 26.0–34.0)
MCHC: 32.2 g/dL (ref 30.0–36.0)
MCV: 90.8 fL (ref 78.0–100.0)
PLATELETS: 217 10*3/uL (ref 150–400)
RBC: 2.94 MIL/uL — AB (ref 4.22–5.81)
RDW: 15.2 % (ref 11.5–15.5)
WBC: 6.3 10*3/uL (ref 4.0–10.5)

## 2017-08-22 LAB — CREATININE, SERUM
CREATININE: 0.94 mg/dL (ref 0.61–1.24)
GFR calc non Af Amer: >60 mL/min (ref >60)

## 2017-08-22 MED ORDER — POLYETHYLENE GLYCOL 3350 17 G PO PACK: 17.0000 g | PACK | Freq: Every day | ORAL | Status: DC | PRN

## 2017-08-22 MED ORDER — OXYCODONE HCL 5 MG PO TABS: 5.0000 mg | ORAL_TABLET | Freq: Four times a day (QID) | ORAL | 0 refills | Status: DC | PRN

## 2017-08-22 MED ORDER — DOCUSATE SODIUM 100 MG PO CAPS: 100.0000 mg | ORAL_CAPSULE | Freq: Two times a day (BID) | ORAL | Status: DC

## 2017-08-22 MED ORDER — ROSUVASTATIN CALCIUM 10 MG PO TABS: 10.0000 mg | ORAL_TABLET | Freq: Every day | ORAL | Status: DC

## 2017-08-22 MED ORDER — ACETAMINOPHEN 325 MG PO TABS: 325.0000 mg | ORAL_TABLET | Freq: Four times a day (QID) | ORAL | Status: DC | PRN

## 2017-08-22 NOTE — Discharge Summary (Signed)
Physician Discharge Summary  Jorge Clements WUX:[id number] DOB: 05-23-1945  PCP: Pearson Grippe, MD  Admit date: 08/11/2017 Discharge date: 08/22/2017  Recommendations for Outpatient Follow-up:  1. MD at SNF in 4 days with repeat labs (CBC & CMP). 2. Dr. Aldean Baker, Orthopedics in 1 week.  SNF to coordinate. 3. Dr. Bo Mcclintock, Vascular surgery in 3 weeks.  Office will call patient with appointment. 4. Dr. Pearson Grippe, new PCP on 09/07/2017 at 1 PM to establish care. 5. Nonweightbearing on right lower extremity for 3 weeks.  Home Health: N/A.  Patient is discharging to SNF. Equipment/Devices:   Portable Praveena plus wound VAC pump.  Discharge Condition: Improved and stable CODE STATUS: Full. Diet recommendation: Heart healthy diet.  Discharge Diagnoses:  Principal Problem:   Weakness Active Problems:   COPD (chronic obstructive pulmonary disease) (HCC)   Cellulitis of right lower extremity   Fall at home, initial encounter   Thrombocytopenia (HCC)   Weakness generalized   Hypokalemia   Essential hypertension   Pressure injury of skin   Infected blister of foot   Subacute osteomyelitis, right ankle and foot (HCC)   Status post transmetatarsal amputation of right foot [facility])   Brief Summary: 72 year old male with PMH of COPD, HTN, PAD status post stenting of both legs presented to [facility] ED on 08/11/2017 with weakness ongoing for quite some time but worse in the 2-3 days PTA, did not have enough strength to get up and ambulate and on the evening of admission fell to his right side after tripping on something, denied hitting his head but did sustain laceration of right arm, pain and swelling of his right foot and calf.  He was admitted for cellulitis of his right leg complicating nonhealing wound.  Orthopedics and vascular surgery was consulted.  Status post right common femoral >popliteal bypass and amputation of right second and third toes by vascular surgery on 5/2.  Status post right  transmetatarsal amputation 5/5.    Assessment & Plan:   Cellulitis/abscess of right foot complicating PAD with nonhealing wound and gangrene of right forefoot: Had been empirically started on IV clindamycin, vancomycin was subsequently added.  Venous Doppler negative for DVT.  Right ABI 0.38 and left ABI 0.68.  Vascular surgery was consulted and underwent angiogram but unsuccessful angioplasty of the right SFA.  Status post right common femoral > popliteal bypass and amputation of right second and third toes by vascular surgery on 5/2.  After discussing with ID, antibiotics were changed to vancomycin and ceftriaxone and clindamycin was discontinued.  Status post trans-metatarsal amputation with wound VAC application 5/5.  As per orthopedics: Discontinue antibiotics 24 hours postop, nonweightbearing right lower extremity for 3 weeks, wound VAC for 1 week, home when safe with ambulation (will be going to SNF) and outpatient follow-up in office in 1 week. Ortho and VVS follow up appreciated. D/w Dr. Lajoyce Corners. Stable for DC to SNF.  All antibiotics have been discontinued.  Improved and stable.  Generalized weakness with fall: CT head negative.  PT evaluated and recommends SNF.  TSH normal.  Folate normal.    COPD: Stable. Former smoker but quit 4 years ago.  Elevated troponin: Minimal and flat trend.  Low index of suspicion for ACS.  Normocytic anemia: May have had mild acute blood loss anemia.  Hemoglobin dropped from 11-9.5.  Hemoglobin stable mid 8 gm range for the last 3 days.  Thrombocytopenia: Resolved  Abnormal LFTs: Unclear etiology.  Improved.  Follow LFTs as outpatient.  Hypokalemia: Replaced.  Dehydration with hyponatremia: Resolved.  Essential hypertension: Currently controlled off of medications.  Patient was not on any antihypertensives PTA.  Dyslipidemia: Not sure if patient was taking rosuvastatin 40 mg daily at home or not.  Here he was placed on rosuvastatin 10 mg daily,  may consider increasing dose as outpatient.   Consultants:  Vascular Orthopedics  Procedures:  Angiogram 5/1 Status post right femoropopliteal bypass and amputation of right second and third toes by vascular surgery on 5/2. Right transmetatarsal amputation 5/5.    Discharge Instructions  Discharge Instructions    Call MD for:  difficulty breathing, headache or visual disturbances   Complete by:  As directed    Call MD for:  extreme fatigue   Complete by:  As directed    Call MD for:  persistant dizziness or light-headedness   Complete by:  As directed    Call MD for:  persistant nausea and vomiting   Complete by:  As directed    Call MD for:  redness, tenderness, or signs of infection (pain, swelling, redness, odor or green/yellow discharge around incision site)   Complete by:  As directed    Call MD for:  severe uncontrolled pain   Complete by:  As directed    Call MD for:  temperature >100.4   Complete by:  As directed    Diet - low sodium heart healthy   Complete by:  As directed    Discharge instructions   Complete by:  As directed    Nonweightbearing on operated right lower extremity for 3 weeks.   Increase activity slowly   Complete by:  As directed    Negative Pressure Wound Therapy - Incisional   Complete by:  As directed    Attach wound VAC to the portable Praveena plus wound VAC pump at time of discharge.  Obtain the Praveena pump from the operating room.       Medication List    STOP taking these medications   lisinopril-hydrochlorothiazide 20-12.5 MG tablet Commonly known as:  PRINZIDE     TAKE these medications   acetaminophen 325 MG tablet Commonly known as:  TYLENOL Take 1-2 tablets (325-650 mg total) by mouth every 6 (six) hours as needed for mild pain, fever or headache.   aspirin 325 MG tablet Take 325 mg by mouth daily. As a buffered tablet antiplatelet   docusate sodium 100 MG capsule Commonly known as:  COLACE Take 1 capsule (100 mg  total) by mouth 2 (two) times daily.   oxyCODONE 5 MG immediate release tablet Commonly known as:  Oxy IR/ROXICODONE Take 1-2 tablets (5-10 mg total) by mouth every 6 (six) hours as needed for moderate pain or severe pain.   polyethylene glycol packet Commonly known as:  MIRALAX / GLYCOLAX Take 17 g by mouth daily as needed for mild constipation.   rosuvastatin 10 MG tablet Commonly known as:  CRESTOR Take 1 tablet (10 mg total) by mouth daily. What changed:    medication strength  how much to take  when to take this      Follow-up Information    Pearson Grippe, MD Follow up.   Specialty:  Internal Medicine Why:  appt scheduled for May 22nd at 1 pm to establish care  Contact information: [address] [place] [address] [phone]        Maeola Harman, MD In 3 weeks.   Specialties:  Vascular Surgery, Cardiology Why:  Office will call you to arrange  your appt (sent) Contact information: [address] [place] [address] [phone]        Nadara Mustard, MD. Schedule an appointment as soon as possible for a visit in 1 week(s).   Specialty:  Orthopedic Surgery Contact information: [address] [place] [address] [phone]        MD at SNF. Schedule an appointment as soon as possible for a visit in 4 day(s).   Why:  To be seen with repeat labs (CBC & CMP).         Allergies  Allergen Reactions  . Penicillins Hives and Rash    Childhood reaction Has patient had a PCN reaction causing immediate rash, facial/tongue/throat swelling, SOB or lightheadedness with hypotension: No PT DEVELOPED ## SEVERE ## RASH INVOLVING MUCUS MEMBRANES or SKIN NECROSIS: #  #  YES  #  # Has patient had a PCN reaction that required hospitalization: Unknown Has patient had a PCN reaction occurring within the last 10 years: No  . Clindamycin Rash    Diffuse rash across body      Procedures/Studies: Dg Chest 2 View  Result  Date: 08/11/2017 CLINICAL DATA:  Fever and weakness, chills for 3 days. History of COPD. EXAM: CHEST - 2 VIEW COMPARISON:  None. FINDINGS: Cardiomediastinal silhouette is normal. Calcified aortic arch. No pleural effusions or focal consolidations. Mild hyperinflation. Trachea projects midline and there is no pneumothorax. Soft tissue planes and included osseous structures are non-suspicious. Mild degenerative change of thoracic spine. IMPRESSION: Mild hyperinflation without focal consolidation. Aortic Atherosclerosis (ICD10-I70.0). Electronically Signed   By: Awilda Metro M.D.   On: 08/11/2017 04:00   Ct Head Wo Contrast  Result Date: 08/11/2017 CLINICAL DATA:  Syncope.  COPD. EXAM: CT HEAD WITHOUT CONTRAST TECHNIQUE: Contiguous axial images were obtained from the base of the skull through the vertex without intravenous contrast. COMPARISON:  None. FINDINGS: Brain: No evidence of acute infarction, hemorrhage, extra-axial collection, ventriculomegaly, or mass effect. Generalized cerebral atrophy. Periventricular white matter low attenuation likely secondary to microangiopathy. Vascular: Cerebrovascular atherosclerotic calcifications are noted. Skull: Negative for fracture or focal lesion. Sinuses/Orbits: Visualized portions of the orbits are unremarkable. Visualized portions of the paranasal sinuses and mastoid air cells are unremarkable. Other: None. IMPRESSION: No acute intracranial pathology. Electronically Signed   By: Elige Ko   On: 08/11/2017 09:03   Mr Foot Right W Wo Contrast  Result Date: 08/14/2017 CLINICAL DATA:  Pain and swelling in the forefoot with difficult ambulation. Plantar foot ulcer noted. Low-grade fever. EXAM: MRI OF THE RIGHT FOREFOOT WITHOUT AND WITH CONTRAST TECHNIQUE: Multiplanar, multisequence MR imaging of the right foot was performed before and after the administration of intravenous contrast. CONTRAST:  17mL MULTIHANCE GADOBENATE DIMEGLUMINE 529 MG/ML IV SOLN COMPARISON:   None. FINDINGS: There is an open wound on the plantar aspect of the forefoot overlying the region of the second MTP joint. There is an associated communicating large dissecting abscess involving the plantar soft tissues and than dissecting between the second and third metacarpal heads and into the dorsum of the foot. This abscess measures approximately 4.9 x 4.2 x 2.1 cm. A small amount of gas is noted the abscess. There is associated severe cellulitis and moderate myofasciitis but I do not see any definite MR findings for septic arthritis or osteomyelitis. IMPRESSION: 1. Open wound on the plantar aspect of the forefoot communicating with a large dissecting abscess as detailed above. 2. Associated severe cellulitis and moderate myofasciitis. 3. No definite MR findings for  septic arthritis or osteomyelitis. Electronically Signed   By: Rudie Meyer M.D.   On: 08/14/2017 11:12   US Venous Img Lower Unilateral Right  Result Date: 08/11/2017 CLINICAL DATA:  73 year old male with right lower extremity pain and swelling from the knee to the foot. EXAM: RIGHT LOWER EXTREMITY VENOUS DOPPLER ULTRASOUND TECHNIQUE: Gray-scale sonography with graded compression, as well as color Doppler and duplex ultrasound were performed to evaluate the lower extremity deep venous systems from the level of the common femoral vein and including the common femoral, femoral, profunda femoral, popliteal and calf veins including the posterior tibial, peroneal and gastrocnemius veins when visible. The superficial great saphenous vein was also interrogated. Spectral Doppler was utilized to evaluate flow at rest and with distal augmentation maneuvers in the common femoral, femoral and popliteal veins. COMPARISON:  None. FINDINGS: Contralateral Common Femoral Vein: Respiratory phasicity is normal and symmetric with the symptomatic side. No evidence of thrombus. Normal compressibility. Common Femoral Vein: No evidence of thrombus. Normal  compressibility, respiratory phasicity and response to augmentation. Saphenofemoral Junction: No evidence of thrombus. Normal compressibility and flow on color Doppler imaging. Profunda Femoral Vein: No evidence of thrombus. Normal compressibility and flow on color Doppler imaging. Femoral Vein: No evidence of thrombus. Normal compressibility, respiratory phasicity and response to augmentation. Popliteal Vein: No evidence of thrombus. Normal compressibility, respiratory phasicity and response to augmentation. Calf Veins: No evidence of thrombus. Normal compressibility and flow on color Doppler imaging. Superficial Great Saphenous Vein: No evidence of thrombus. Normal compressibility. Venous Reflux:  None. Other Findings:  None. IMPRESSION: No evidence of deep venous thrombosis. Electronically Signed   By: Malachy Moan M.D.   On: 08/11/2017 12:35      Subjective: Appropriate post op R foot pain, controlled with meds. Denies any other complaints. No acute issues per RN.  Discharge Exam:  Vitals:   08/21/17 1416 08/21/17 1954 08/22/17 0450 08/22/17 1144  BP: (!) 108/54 (!) 114/55 (!) 97/47 (!) 108/49  Pulse: 65 70 62 63  Resp:  18  18  Temp: (!) 97.5 F (36.4 C) 97.9 F (36.6 C) 98.1 F (36.7 C) 99 F (37.2 C)  TempSrc: Oral Oral Oral Oral  SpO2: 97% 97% 97% 100%  Weight:      Height:        General exam: Pleasant middle-aged male, moderately built and nourished lying comfortably propped up in bed. Respiratory system: Clear to auscultation. Respiratory effort normal.  Cardiovascular system: S1 & S2 heard, RRR. No JVD, murmurs, rubs, gallops or clicks. No pedal edema. Gastrointestinal system: Abdomen is nondistended, soft and nontender. No organomegaly or masses felt. Normal bowel sounds heard.  Central nervous system: Alert and oriented. No focal neurological deficits.  Extremities: Symmetric 5 x 5 power.  Right forefoot with wound VAC & no acute findings. Skin: Skin tears over R  elbow healing without acute findings. Psychiatry: Judgement and insight appear normal. Mood & affect appropriate.       The results of significant diagnostics from this hospitalization (including imaging, microbiology, ancillary and laboratory) are listed below for reference.     Microbiology: Recent Results (from the past 240 hour(s))  Surgical PCR screen     Status: None   Collection Time: 08/20/17 10:38 PM  Result Value Ref Range Status   MRSA, PCR NEGATIVE NEGATIVE Final   Staphylococcus aureus NEGATIVE NEGATIVE Final    Comment: (NOTE) The Xpert SA Assay (FDA approved for NASAL specimens in patients 88 years of age and older), is  one component of a comprehensive surveillance program. It is not intended to diagnose infection nor to guide or monitor treatment. Performed at [facility], [address] [place], [place] [address]      Labs: CBC: Recent Labs  Lab 08/17/17 [phone] 08/18/17 1642 08/19/17 1408 08/20/17 0343 08/21/17 0300 08/22/17 0442  WBC 4.9 7.9 7.2 4.9 5.3 6.3  NEUTROABS 3.3  --   --   --   --   --   HGB 11.5* 11.0* 9.5* 8.9* 8.4* 8.6*  HCT 34.4* 33.8* 28.7* 26.2* 25.8* 26.7*  MCV 89.1 90.4 88.9 89.7 89.9 90.8  PLT 191 204 192 190 215 217   Basic Metabolic Panel: Recent Labs  Lab 08/17/17 0433 08/18/17 1642 08/19/17 1139 08/22/17 0442  NA 137  --  138  --   K 3.5  --  4.9  --   CL 102  --  106  --   CO2 27  --  20*  --   GLUCOSE 105*  --  123*  --   BUN 6  --  11  --   CREATININE 0.66 0.77 0.79 0.94  CALCIUM 8.2*  --  8.1*  --   MG 2.2  --   --   --    Liver Function Tests: Recent Labs  Lab 08/17/17 0433  AST 38  ALT 64*  ALKPHOS 103  BILITOT 0.8  PROT 5.8*  ALBUMIN 2.3*    Urinalysis    Component Value Date/Time   COLORURINE YELLOW 08/11/2017 0642   APPEARANCEUR CLEAR 08/11/2017 0642   LABSPEC 1.017 08/11/2017 0642   PHURINE 6.0 08/11/2017 0642   GLUCOSEU NEGATIVE 08/11/2017 0642   HGBUR MODERATE (A) 08/11/2017  0642   BILIRUBINUR NEGATIVE 08/11/2017 0642   KETONESUR 5 (A) 08/11/2017 0642   PROTEINUR 30 (A) 08/11/2017 0642   UROBILINOGEN 0.2 11/17/2013 1301   NITRITE NEGATIVE 08/11/2017 0642   LEUKOCYTESUR NEGATIVE 08/11/2017 1308      Time coordinating discharge: 45 minutes  SIGNED:  Marcellus Scott, MD, FACP, [facility]. [facility] Pager [phone]  If 7PM-7AM, please contact night-coverage [email or web address] Password [email or web address] 08/22/2017, 12:48 PM

## 2017-08-22 NOTE — Progress Notes (Signed)
Patient will discharge to [facility] Anticipated discharge date: 5/6 Family notified: pt friend Designer, jewellery by Sharin Mons- scheduled for 2:30pm Report #: [phone]   CSW signing off.  Burna Sis, LCSW Clinical Social Worker [phone]

## 2017-08-22 NOTE — Progress Notes (Addendum)
[facility]  Subjective  - Doing OK over all.  He is non weight bearing for 3 weeks after Dr. Lajoyce Corners.   Objective (!) 97/47 62 98.1 F (36.7 C) (Oral) 18 97%  Intake/Output Summary (Last 24 hours) at 08/22/2017 0737 Last data filed at 08/22/2017 0600 Gross per 24 hour  Intake 1464.83 ml  Output 2150 ml  Net -685.17 ml    Right groin soft without hematoma, right LE incisions are healing well. Doppler PT/AT signals Palpable right popliteal pulse Wound vac on right transmet incision no drainage Heart RRR Lungs non labored breathing Gen NAD  Assessment/Planning: POD #72 y.o.maleis s/pright femoral to above-knee popliteal artery bypass with ipsilateral reverse greater saphenous vein and right second third toe amputation for wet gangrene Patent by pass with doppler signals.  Well healing incisions  POD# 1 transmetatarsal amputation by Dr. Lajoyce Corners.  Patient may discharge at home when medically stable.  Plan to change out the hospital wound VAC pump for the Praveena plus at discharge. Non weight bearing right LE for 3 weeks.    PT/OT eval and treat for recommendations.  Heparin SQ for DVT prophylaxis and 325 mg Asa daily. Patient is still receiving IV antibiotics.    Mosetta Pigeon 08/22/2017 7:37 AM --  Laboratory Lab Results: Recent Labs    08/21/17 0300 08/22/17 0442  WBC 5.3 6.3  HGB 8.4* 8.6*  HCT 25.8* 26.7*  PLT 215 217   BMET Recent Labs    08/19/17 1139 08/22/17 0442  NA 138  --   K 4.9  --   CL 106  --   CO2 20*  --   GLUCOSE 123*  --   BUN 11  --   CREATININE 0.79 0.94  CALCIUM 8.1*  --     COAG Lab Results  Component Value Date   INR 1.13 08/17/2017   No results found for: PTT  I have independently and examined interviewed patient and agree with PA assessment and plan above.   Briele Lagasse C. Randie Heinz, MD [facility] [place] [facility] Office: [phone] Pager: [phone]

## 2017-08-22 NOTE — Clinical Social Work Placement (Signed)
   CLINICAL SOCIAL WORK PLACEMENT  NOTE  Date:  08/22/2017  Patient Details  Name: Jorge Clements MRN: [id number] Date of Birth: 08-05-1945  Clinical Social Work is seeking post-discharge placement for this patient at the Skilled  Nursing Facility level of care (*CSW will initial, date and re-position this form in  chart as items are completed):  Yes   Patient/family provided with [facility] Clinical Social Work Department's list of facilities offering this level of care within the geographic area requested by the patient (or if unable, by the patient's family).  Yes   Patient/family informed of their freedom to choose among providers that offer the needed level of care, that participate in Medicare, Medicaid or managed care program needed by the patient, have an available bed and are willing to accept the patient.  Yes   Patient/family informed of [facility]'s ownership interest in [facility] and [facility], as well as of the fact that they are under no obligation to receive care at these facilities.  PASRR submitted to EDS on       PASRR number received on       Existing PASRR number confirmed on 08/15/17     FL2 transmitted to all facilities in geographic area requested by pt/family on 08/15/17     FL2 transmitted to all facilities within larger geographic area on       Patient informed that his/her managed care company has contracts with or will negotiate with certain facilities, including the following:        Yes   Patient/family informed of bed offers received.  Patient chooses bed at [facility]     Physician recommends and patient chooses bed at      Patient to be transferred to [facility] on 08/22/17.  Patient to be transferred to facility by ptar     Patient family notified on 08/22/17 of transfer.  Name of family member notified:  Eber Jones Truitt     PHYSICIAN Please sign FL2     Additional Comment:     _______________________________________________ Burna Sis, LCSW 08/22/2017, 2:00 PM

## 2017-08-22 NOTE — Progress Notes (Signed)
Patient ID: Jorge Clements, male   DOB: 09-26-1945, 72 y.o.   MRN: [id number] Patient is status post transmetatarsal amputation.  Patient is to maintain strict nonweightbearing on the right.  Discussed that this requirement will be for about 3 weeks.  There is no drainage in the wound VAC canister.  Patient may discharge at home when medically stable.  Plan to change out the hospital wound VAC pump for the Praveena plus at discharge.

## 2017-08-22 NOTE — Care Management Important Message (Signed)
Important Message  Patient Details  Name: Jorge Clements MRN: [id number] Date of Birth: 02/02/1946   Medicare Important Message Given:  Yes    Jaelon Gatley P Montavious Wierzba 08/22/2017, 2:42 PM

## 2017-08-22 NOTE — Progress Notes (Signed)
D/c instructions given to pt. Jorge Clements wound vac applied. IV removed, clean and intact. Pt instructed to take preevena box and charger and orthopedic shoe. Other pt belonging could not be located, have called 5W and PACU to find them. Report called to [facility] PTAR to pick up pt.  Jorge Starks, RN

## 2017-08-22 NOTE — Progress Notes (Addendum)
Physical Therapy Treatment and re-eval Patient Details Name: Jorge Clements MRN: [id number] DOB: [birth year] Today's Date: 08/22/2017    History of Present Illness Jorge Clements  is a 72 y.o. male, with history of COPD, hypertension and peripheral vascular disease Recent fall on his right side and feels that he tripped on something and was very weak. 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  Transmet amputation on 08/21/17. VAC placed.     PT Comments    Pt admitted with above diagnosis. Pt currently with functional limitations due to balance and endurance deficits as well as now strict NWB on right LE which will limit pts mobility.  Pt still need SNF to adddress functional mobility and give pt time to heal and practice mobility.  Tolerated exercise well as pt refused to get OOB.  Explained to pt that he can get OOB and plan for anterior posterior transfer. Goals of 5/3 revised as pt will not meet the ambuation goal as he is NWB now and most likely will be transfers only for the next few weeks. Pt will benefit from skilled PT to increase their independence and safety with mobility to allow discharge to the venue listed below.    Follow Up Recommendations  SNF;Supervision/Assistance - 24 hour     Equipment Recommendations  None recommended by PT    Recommendations for Other Services       Precautions / Restrictions Precautions Precautions: Fall Precaution Comments: VAC on right foot Restrictions Weight Bearing Restrictions: Yes RLE Weight Bearing: Non weight bearing Other Position/Activity Restrictions: Strict NWB for 3 weeks per Dr. Lajoyce Corners    Mobility  Bed Mobility               General bed mobility comments: Encouraged pt to work with PT and get OOB but pt refused adamantly.   Transfers                    Ambulation/Gait             General  Gait Details: Will be transfers only as he is strict NWB right LE for 3 weeks.    Stairs                                                                   Cognition Arousal/Alertness: Awake/alert Behavior During Therapy: WFL for tasks assessed/performed Overall Cognitive Status: No family/caregiver present to determine baseline cognitive functioning Area of Impairment: Safety/judgement;Attention                   Current Attention Level: Sustained     Safety/Judgement: Decreased awareness of safety            Exercises General Exercises - Upper Extremity Shoulder Flexion: AROM;Both;10 reps;Supine Elbow Flexion: AROM;Both;10 reps;Supine General Exercises - Lower Extremity Ankle Circles/Pumps: AROM;Both;10 reps;Supine Heel Slides: AROM;Both;10 reps;Supine Hip ABduction/ADduction: AROM;Both;10 reps;Supine Straight Leg Raises: AROM;Both;10 reps;Supine    General Comments        Pertinent Vitals/Pain Pain Assessment: Faces Faces Pain Scale: Hurts whole lot Pain Location: Right foot, surgical site  Pain Descriptors / Indicators: Aching;Discomfort;Guarding Pain Intervention(s): Limited activity within patient's tolerance;Monitored during session;Premedicated before  session;Repositioned                                      PT Goals (current goals can now be found in the care plan section) Acute Rehab PT Goals PT Goal Formulation: With patient Time For Goal Achievement: 09/02/17 Potential to Achieve Goals: Good Progress towards PT goals: Not progressing toward goals - comment(pt refused to get OOB but did exercise with PT)    Frequency    Min 2X/week(decr frequency as pt is SNF.)      PT Plan Frequency needs to be updated                  AM-PAC PT "6 Clicks" Daily Activity  Outcome Measure  Difficulty turning over in bed (including adjusting bedclothes, sheets and blankets)?: A Lot Difficulty moving  from lying on back to sitting on the side of the bed? : Unable Difficulty sitting down on and standing up from a chair with arms (e.g., wheelchair, bedside commode, etc,.)?: Unable Help needed moving to and from a bed to chair (including a wheelchair)?: Total Help needed walking in hospital room?: Total Help needed climbing 3-5 steps with a railing? : Total 6 Click Score: 7    End of Session Equipment Utilized During Treatment: Gait belt Activity Tolerance: Patient limited by fatigue;Patient limited by pain Patient left: with call bell/phone within reach;in bed Nurse Communication: Mobility status PT Visit Diagnosis: Unsteadiness on feet (R26.81);Other abnormalities of gait and mobility (R26.89);Muscle weakness (generalized) (M62.81)     Time: 3086-5784 PT Time Calculation (min) (ACUTE ONLY): 11 min  Charges:  $Therapeutic Exercise: 8-22 mins                    G Codes:       Garnett Rekowski,PT Acute Rehabilitation [phone] (pager)    Berline Lopes 08/22/2017, 9:30 AM

## 2017-08-22 NOTE — Progress Notes (Signed)
PROGRESS NOTE   Jorge Clements  ZOX:[id number]    DOB: Sep 30, 1945    DOA: 08/11/2017  PCP: Pearson Grippe, MD   I have briefly reviewed patients previous medical records in [facility].  Brief Narrative:  72 year old male with PMH of COPD, HTN, PAD status post stenting of both legs presented to [facility] ED on 08/11/2017 with weakness ongoing for quite some time but worse in the 2-3 days PTA, did not have enough strength to get up and ambulate and on the evening of admission fell to his right side after tripping on something, denied hitting his head but did sustain laceration of right arm, pain and swelling of his right foot and calf.  He was admitted for cellulitis of his right leg complicating nonhealing wound.  Orthopedics and vascular surgery was consulted.  Status post right common femoral >popliteal bypass and amputation of right second and third toes by vascular surgery on 5/2.  Status post right transmetatarsal amputation 5/5.  Discussed with ID 5/3 and adjusted antibiotics. DC'ed all post op ABx on 5/6 and stable for DC to SNF when bed available.   Assessment & Plan:   Principal Problem:   Weakness Active Problems:   COPD (chronic obstructive pulmonary disease) (HCC)   Cellulitis of right lower extremity   Fall at home, initial encounter   Thrombocytopenia (HCC)   Weakness generalized   Hypokalemia   Essential hypertension   Pressure injury of skin   Infected blister of foot   Subacute osteomyelitis, right ankle and foot (HCC)   Cellulitis/abscess of right foot complicating PAD with nonhealing wound and gangrene of right forefoot: Had been empirically started on IV clindamycin, vancomycin was subsequently added.  Venous Doppler negative for DVT.  Right ABI 0.38 and left ABI 0.68.  Vascular surgery was consulted and underwent angiogram but unsuccessful angioplasty of the right SFA.  Status post right common femoral > popliteal bypass and amputation of right second and third toes by  vascular surgery on 5/2.  I discussed with infectious disease MD on call 5/3 who recommended changing Clindamycin to ceftriaxone, continue vancomycin and could possibly discontinue all antibiotics 24 to 48 hours post surgery.  Status post trans-metatarsal amputation with wound VAC application 5/5.  As per orthopedics: Discontinue antibiotics 24 hours postop, nonweightbearing right lower extremity for 3 weeks, wound VAC for 1 week, home when safe with ambulation (will be going to SNF) and outpatient follow-up in office in 1 week. Ortho and VVS follow up appreciated. D/w Dr. Lajoyce Corners. Stable for DC to SNF when bed available. D/W RN. Await CSW input.  Generalized weakness with fall: CT head negative.  PT evaluated and recommends SNF.  TSH normal.  Folate normal.  DC to SNF probably early to mid next week.  COPD: Stable..  Former smoker but quit 4 years ago.  Elevated troponin: Minimal and flat trend.  Low index of suspicion for ACS.  Normocytic anemia: May have had mild acute blood loss anemia.  Hemoglobin is dropped from 11-9.5.  Relatively stable.  Hemoglobin stable mid 8 gm range.  Thrombocytopenia: Resolved  Abnormal LFTs: Unclear etiology.  Improved.  Hypokalemia: Replaced.  Dehydration with hyponatremia: HCTZ and ARB held.  Resolved.  Essential hypertension: Currently controlled off of medications.  HCTZ and ARB on hold.  Resume when blood pressure starts to increase.   DVT prophylaxis: Subcutaneous heparin. Code Status: Full Family Communication: None at bedside Disposition: DC to SNF pending bed availability.   Consultants:  Vascular Orthopedics  Procedures:  Angiogram 5/1 Status post right femoropopliteal bypass and amputation of right second and third toes by vascular surgery on 5/2. Right transmetatarsal amputation 5/5.  Antimicrobials:  IV clindamycin-discontinued 5/3 IV vancomycin > IV ceftriaxone 5/3 >.   Subjective: Appropriate post op R foot pain, controlled with  meds. Denies any other complaints. No acute issues per RN.  ROS: As above.  Objective:  Vitals:   08/21/17 1238 08/21/17 1416 08/21/17 1954 08/22/17 0450  BP: (!) 100/54 (!) 108/54 (!) 114/55 (!) 97/47  Pulse:  65 70 62  Resp:   18   Temp:  (!) 97.5 F (36.4 C) 97.9 F (36.6 C) 98.1 F (36.7 C)  TempSrc:  Oral Oral Oral  SpO2:  97% 97% 97%  Weight:      Height:        Examination:   General exam: Pleasant middle-aged male, moderately built and nourished lying comfortably propped up in bed. Respiratory system: Clear to auscultation. Respiratory effort normal.  Stable. Cardiovascular system: S1 & S2 heard, RRR. No JVD, murmurs, rubs, gallops or clicks. No pedal edema. Gastrointestinal system: Abdomen is nondistended, soft and nontender. No organomegaly or masses felt. Normal bowel sounds heard. stable. Central nervous system: Alert and oriented. No focal neurological deficits.  Stable. Extremities: Symmetric 5 x 5 power.  Right forefoot with wound VAC & no acute findings. Skin: Skin tears over R elbow healing without acute findings. Psychiatry: Judgement and insight appear normal. Mood & affect appropriate.     Data Reviewed: I have personally reviewed following labs and imaging studies  CBC: Recent Labs  Lab 08/17/17 0433 08/18/17 1642 08/19/17 1408 08/20/17 0343 08/21/17 0300 08/22/17 0442  WBC 4.9 7.9 7.2 4.9 5.3 6.3  NEUTROABS 3.3  --   --   --   --   --   HGB 11.5* 11.0* 9.5* 8.9* 8.4* 8.6*  HCT 34.4* 33.8* 28.7* 26.2* 25.8* 26.7*  MCV 89.1 90.4 88.9 89.7 89.9 90.8  PLT 191 204 192 190 215 217   Basic Metabolic Panel: Recent Labs  Lab 08/17/17 0433 08/18/17 1642 08/19/17 1139 08/22/17 0442  NA 137  --  138  --   K 3.5  --  4.9  --   CL 102  --  106  --   CO2 27  --  20*  --   GLUCOSE 105*  --  123*  --   BUN 6  --  11  --   CREATININE 0.66 0.77 0.79 0.94  CALCIUM 8.2*  --  8.1*  --   MG 2.2  --   --   --    Liver Function Tests: Recent Labs    Lab 08/17/17 0433  AST 38  ALT 64*  ALKPHOS 103  BILITOT 0.8  PROT 5.8*  ALBUMIN 2.3*   Coagulation Profile: Recent Labs  Lab 08/17/17 0903  INR 1.13     Recent Results (from the past 240 hour(s))  Surgical PCR screen     Status: None   Collection Time: 08/20/17 10:38 PM  Result Value Ref Range Status   MRSA, PCR NEGATIVE NEGATIVE Final   Staphylococcus aureus NEGATIVE NEGATIVE Final    Comment: (NOTE) The Xpert SA Assay (FDA approved for NASAL specimens in patients 39 years of age and older), is one component of a comprehensive surveillance program. It is not intended to diagnose infection nor to guide or monitor treatment. Performed at [facility], [address] [place], [place] [address]  Radiology Studies: No results found.      Scheduled Meds: . aspirin  325 mg Oral Daily  . docusate sodium  100 mg Oral BID  . heparin  5,000 Units Subcutaneous Q8H  . mupirocin ointment  1 application Nasal BID  . pantoprazole  40 mg Oral Daily  . rosuvastatin  10 mg Oral Daily  . sodium chloride flush  3 mL Intravenous Q12H   Continuous Infusions: . sodium chloride    . sodium chloride Stopped (08/21/17 1532)  . lactated ringers 10 mL/hr at 08/21/17 0824  . methocarbamol (ROBAXIN)  IV       LOS: 9 days     Marcellus Scott, MD, FACP, [facility]. [facility] Pager [phone]  If 7PM-7AM, please contact night-coverage [email or web address] Password [email or web address] 08/22/2017, 8:43 AM

## 2017-08-22 NOTE — Discharge Instructions (Signed)
° °[facility] ° °Discharge instructions ° °Lower Extremity Bypass Surgery ° °Please refer to the following instruction for your post-procedure care. Your surgeon or physician assistant will discuss any changes with you. ° °Activity ° °You are encouraged to walk as much as you can. You can slowly return to normal activities during the month after your surgery. Avoid strenuous activity and heavy lifting until your doctor tells you it's OK. Avoid activities such as vacuuming or swinging a golf club. Do not drive until your doctor give the OK and you are no longer taking prescription pain medications. It is also normal to have difficulty with sleep habits, eating and bowel movement after surgery. These will go away with time. ° °Bathing/Showering ° °Shower daily after you go home. Do not soak in a bathtub, hot tub, or swim until the incision heals completely. ° °Incision Care ° °Clean your incision with mild soap and water. Shower every day. Pat the area dry with a clean towel. You do not need a bandage unless otherwise instructed. Do not apply any ointments or creams to your incision. If you have open wounds you will be instructed how to care for them or a visiting nurse may be arranged for you. If you have staples or sutures along your incision they will be removed at your post-op appointment. You may have skin glue on your incision. Do not peel it off. It will come off on its own in about one week. ° °Wash the groin wound with soap and water daily and pat dry. (No tub bath-only shower)  Then put a dry gauze or washcloth in the groin to keep this area dry to help prevent wound infection.  Do this daily and as needed.  Do not use Vaseline or neosporin on your incisions.  Only use soap and water on your incisions and then protect and keep dry. ° °Diet ° °Resume your normal diet. There are no special food restrictions following this procedure. A low fat/ low cholesterol diet is  recommended for all patients with vascular disease. In order to heal from your surgery, it is CRITICAL to get adequate nutrition. Your body requires vitamins, minerals, and protein. Vegetables are the best source of vitamins and minerals. Vegetables also provide the perfect balance of protein. Processed food has little nutritional value, so try to avoid this. ° °Medications ° °Resume taking all your medications unless your doctor or physician assistant tells you not to. If your incision is causing pain, you may take over-the-counter pain relievers such as acetaminophen (Tylenol). If you were prescribed a stronger pain medication, please aware these medication can cause nausea and constipation. Prevent nausea by taking the medication with a snack or meal. Avoid constipation by drinking plenty of fluids and eating foods with high amount of fiber, such as fruits, vegetables, and grains. Take Colace 100 mg (an over-the-counter stool softener) twice a day as needed for constipation.  °Do not take Tylenol if you are taking prescription pain medications. ° °Follow Up ° °Our office will schedule a follow up appointment 2-3 weeks following discharge. ° °Please call us immediately for any of the following conditions ° °•Severe or worsening pain in your legs or feet while at rest or while walking •Increase pain, redness, warmth, or drainage (pus) from your incision site(s) °Fever of 101 degree or higher °The swelling in your leg with the bypass suddenly worsens and becomes more painful than when you were in the hospital °If you have   been instructed to feel your graft pulse then you should do so every day. If you can no longer feel this pulse, call the office immediately. Not all patients are given this instruction.  Leg swelling is common after leg bypass surgery.  The swelling should improve over a few months following surgery. To improve the swelling, you may elevate your legs above the level of your heart while you are  sitting or resting. Your surgeon or physician assistant may ask you to apply an ACE wrap or wear compression (TED) stockings to help to reduce swelling.  Reduce your risk of vascular disease  Stop smoking. If you would like help call [facility] at 1-800-QUIT-NOW ((574)820-8096) or [place] at (703)386-8644.  Manage your cholesterol Maintain a desired weight Control your diabetes weight Control your diabetes Keep your blood pressure down  If you have any questions, please call the office at [phone]    Additional discharge instructions:  Please get your medications reviewed and adjusted by your Primary MD.  Please request your Primary MD to go over all Hospital Tests and Procedure/Radiological results at the follow up, please get all Hospital records sent to your Prim MD by signing hospital release before you go home.  If you had Pneumonia of Lung problems at the Hospital: Please get a 2 view Chest X ray done in 6-8 weeks after hospital discharge or sooner if instructed by your Primary MD.  If you have Congestive Heart Failure: Please call your Cardiologist or Primary MD anytime you have any of the following symptoms:  1) 3 pound weight gain in 24 hours or 5 pounds in 1 week  2) shortness of breath, with or without a dry hacking cough  3) swelling in the hands, feet or stomach  4) if you have to sleep on extra pillows at night in order to breathe  Follow cardiac low salt diet and 1.5 lit/day fluid restriction.  If you have diabetes Accuchecks 4 times/day, Once in AM empty stomach and then before each meal. Log in all results and show them to your primary doctor at your next visit. If any glucose reading is under 80 or above 300 call your primary MD immediately.  If you have Seizure/Convulsions/Epilepsy: Please do not drive, operate heavy machinery, participate in activities at heights or participate in high speed sports until you have seen by Primary MD or a Neurologist  and advised to do so again.  If you had Gastrointestinal Bleeding: Please ask your Primary MD to check a complete blood count within one week of discharge or at your next visit. Your endoscopic/colonoscopic biopsies that are pending at the time of discharge, will also need to followed by your Primary MD.  Get Medicines reviewed and adjusted. Please take all your medications with you for your next visit with your Primary MD  Please request your Primary MD to go over all hospital tests and procedure/radiological results at the follow up, please ask your Primary MD to get all Hospital records sent to his/her office.  If you experience worsening of your admission symptoms, develop shortness of breath, life threatening emergency, suicidal or homicidal thoughts you must seek medical attention immediately by calling 911 or calling your MD immediately  if symptoms less severe.  You must read complete instructions/literature along with all the possible adverse reactions/side effects for all the Medicines you take and that have been prescribed to you. Take any new Medicines after you have completely understood and accpet all the possible adverse reactions/side  effects.   Do not drive or operate heavy machinery when taking Pain medications.   Do not take more than prescribed Pain, Sleep and Anxiety Medications  Special Instructions: If you have smoked or chewed Tobacco  in the last 2 yrs please stop smoking, stop any regular Alcohol  and or any Recreational drug use.  Wear Seat belts while driving.  Please note You were cared for by a hospitalist during your hospital stay. If you have any questions about your discharge medications or the care you received while you were in the hospital after you are discharged, you can call the unit and asked to speak with the hospitalist on call if the hospitalist that took care of you is not available. Once you are discharged, your primary care physician will handle  any further medical issues. Please note that NO REFILLS for any discharge medications will be authorized once you are discharged, as it is imperative that you return to your primary care physician (or establish a relationship with a primary care physician if you do not have one) for your aftercare needs so that they can reassess your need for medications and monitor your lab values.  You can reach the hospitalist office at phone [phone] or fax [phone]   If you do not have a primary care physician, you can call [phone] for a physician referral.

## 2017-08-22 NOTE — Care Management Note (Signed)
Case Management Note Donn Pierini RN, BSN Unit 4E-Case Manager [phone]  Patient Details  Name: Jorge Clements MRN: [id number] Date of Birth: [birth year]  Subjective/Objective:  Pt admitted with cellulitis and ischemic LE- s/p right common femoral >popliteal bypass and amputation of right second and third toes by vascular surgery on 5/2.  s/p right transmetatarsal amputation 5/5.-                 Action/Plan: PTA pt lived at home alone, his ex-wife and son live next door- has RW and cane at home. Per PT evals recommendations for [facility]- CSW following for placement needs- pt will need Prevena Plus wound VAC at time of discharge- bedside RN to request from OR and change over to portable VAC at time of discharge.  CM was notified by Tiffany with [facility] pt had pre-op referral for any HH needs- will notify Tiffany pt to go to SNF.   Expected Discharge Date:     08/22/17             Expected Discharge Plan:  Skilled Nursing Facility  In-House Referral:  Clinical Social Work  Discharge planning Services  CM Consult  Post Acute Care Choice:  Durable Medical Equipment Choice offered to:  Patient  DME Arranged:  Vac DME Agency:  [facility]  HH Arranged:  NA HH Agency:  [facility]  Status of Service:  Completed, signed off  If discussed at Long Length of Stay Meetings, dates discussed:    Discharge Disposition: skilled facility   Additional Comments:  Darrold Span, RN 08/22/2017, 10:55 AM

## 2017-08-23 ENCOUNTER — Telehealth: Payer: Self-pay | Admitting: Vascular Surgery

## 2017-08-23 ENCOUNTER — Telehealth (INDEPENDENT_AMBULATORY_CARE_PROVIDER_SITE_OTHER): Payer: Self-pay | Admitting: Orthopedic Surgery

## 2017-08-23 NOTE — Telephone Encounter (Signed)
-----   Message from Sharee Pimple, RN sent at 08/23/2017  1:55 PM EDT ----- Regarding: 3-4 weeks with Dr. Randie Heinz, postop bypass   ----- Message ----- From: Lars Mage, PA-C Sent: 08/23/2017  10:31 AM To: Vvs Charge Pool  F/U 3-4 weeks s/p right fem-pop with Dr. Randie Heinz

## 2017-08-23 NOTE — Telephone Encounter (Signed)
Sched appt 09/23/17 at 1:30. Lm on hm# to inform pt of appt.

## 2017-08-23 NOTE — Telephone Encounter (Signed)
Can you please call and make appt for next week? Dressing stays on until appt 7 days after d/c which would be next Tuesday.

## 2017-08-23 NOTE — Telephone Encounter (Signed)
Amy, a treatment nurse at [facility] in [place] needs wound care/ wound vac orders. How long to leave it on, or until follow up appt, etc. Her CB # [phone]

## 2017-08-23 NOTE — Telephone Encounter (Signed)
Does he get scheduled with you on Wednesday?

## 2017-08-23 NOTE — Telephone Encounter (Signed)
It can be with me if the vac is still on.

## 2017-08-24 NOTE — Telephone Encounter (Signed)
Left message for patient to call and schedule follow up appt

## 2017-08-25 ENCOUNTER — Telehealth (INDEPENDENT_AMBULATORY_CARE_PROVIDER_SITE_OTHER): Payer: Self-pay | Admitting: Orthopedic Surgery

## 2017-08-25 NOTE — Telephone Encounter (Signed)
IC and s/w Amy and advised her to leave wound vac on and keep changing canisters daily if needed.  I advised if vac stops or malfunctions, they can remove it and put a dry dressing on.  Appt made in office for Tues 08/30/17 for eval

## 2017-08-25 NOTE — Telephone Encounter (Signed)
Amy from an [place] facility called wanting to know the wound vac orders for the patient. CB # [phone]

## 2017-08-26 ENCOUNTER — Telehealth (INDEPENDENT_AMBULATORY_CARE_PROVIDER_SITE_OTHER): Payer: Self-pay

## 2017-08-26 NOTE — Telephone Encounter (Signed)
Talked with Cyd at [facility] in [place] and advised her of message per Dr. Lajoyce Corners concerning patient.

## 2017-08-26 NOTE — Telephone Encounter (Signed)
That is his vascular surgery incision, call vascular vein surgery

## 2017-08-26 NOTE — Telephone Encounter (Signed)
Amy with [facility] in [place] called stating that patient is having redness and hardness where the incision is surgically glued in his groin area.  Cb# is [phone] and ask for [address] nurse for patient. Can you advise on this please?  Thank you.

## 2017-08-30 ENCOUNTER — Ambulatory Visit (INDEPENDENT_AMBULATORY_CARE_PROVIDER_SITE_OTHER): Payer: Medicare Other | Admitting: Orthopedic Surgery

## 2017-08-30 ENCOUNTER — Encounter (INDEPENDENT_AMBULATORY_CARE_PROVIDER_SITE_OTHER): Payer: Self-pay | Admitting: Orthopedic Surgery

## 2017-08-30 DIAGNOSIS — Z89431 Acquired absence of right foot: Secondary | ICD-10-CM

## 2017-08-30 NOTE — Progress Notes (Signed)
Office Visit Note   Patient: Jorge Clements           Date of Birth: Feb 20, 1946           MRN: [id number] Visit Date: 08/30/2017              Requested by: Pearson Grippe, MD [address] [place], [place] [address] PCP: Pearson Grippe, MD  Chief Complaint  Patient presents with  . Right Foot - Follow-up, Routine Post Op      HPI: Patient is a 72 year old gentleman who is 1 week status post transmetatarsal dictation right foot he is status post revascularization by vascular vein surgery.  Assessment & Plan: Visit Diagnoses:  1. Status post transmetatarsal amputation of right foot (HCC)     Plan: Recommended elevation and nonweightbearing to continue for the right lower extremity.  Patient's vascular incision is healing nicely with no signs of infection.  He will follow-up with [facility] for routine follow-up.  Follow-up next week with Erin for removal of the sutures.  Follow-Up Instructions: Return in about 1 week (around 09/06/2017).   Ortho Exam  Patient is alert, oriented, no adenopathy, well-dressed, normal affect, normal respiratory effort. Examination patient does have venostasis swelling in the right lower extremity but no open ulcers the wound is well approximated there is no redness no cellulitis no odor no drainage.  Discussed the importance of elevation and nonweightbearing.  Patient will continue with compression wraps.  Imaging: No results found. No images are attached to the encounter.  Labs: No results found for: HGBA1C, ESRSEDRATE, CRP, LABURIC, REPTSTATUS, GRAMSTAIN, CULT, LABORGA   Lab Results  Component Value Date   ALBUMIN 2.3 (L) 08/17/2017   ALBUMIN 2.1 (L) 08/15/2017    There is no height or weight on file to calculate BMI.  Orders:  No orders of the defined types were placed in this encounter.  No orders of the defined types were placed in this encounter.    Procedures: No procedures performed  Clinical Data: No additional  findings.  ROS:  All other systems negative, except as noted in the HPI. Review of Systems  Objective: Vital Signs: There were no vitals taken for this visit.  Specialty Comments:  No specialty comments available.  PMFS History: Patient Active Problem List   Diagnosis Date Noted  . Status post transmetatarsal amputation of right foot (HCC)   . Subacute osteomyelitis, right ankle and foot (HCC)   . Infected blister of foot 08/13/2017  . Pressure injury of skin 08/12/2017  . Weakness 08/11/2017  . COPD (chronic obstructive pulmonary disease) (HCC) 08/11/2017  . Cellulitis of right lower extremity 08/11/2017  . Fall at home, initial encounter 08/11/2017  . Thrombocytopenia (HCC) 08/11/2017  . Weakness generalized 08/11/2017  . Hypokalemia 08/11/2017  . Essential hypertension 08/11/2017   Past Medical History:  Diagnosis Date  . COPD (chronic obstructive pulmonary disease) (HCC)     History reviewed. No pertinent family history.  Past Surgical History:  Procedure Laterality Date  . ABDOMINAL AORTOGRAM N/A 08/17/2017   Procedure: ABDOMINAL AORTOGRAM;  Surgeon: Maeola Harman, MD;  Location: [facility] INVASIVE CV LAB;  Service: Cardiovascular;  Laterality: N/A;  . AMPUTATION Right 08/21/2017   Procedure: TRANSMETATARSAL AMPUTATION RIGHT FOOT;  Surgeon: Nadara Mustard, MD;  Location: [facility] OR;  Service: Orthopedics;  Laterality: Right;  . AMPUTATION TOE Right 08/18/2017   Procedure: AMPUTATION RIGHT SECOND AND THIRD TOES;  Surgeon: Maeola Harman, MD;  Location: [facility] OR;  Service:  Vascular;  Laterality: Right;  . ENDARTERECTOMY FEMORAL Right 08/18/2017   Procedure: ENDARTERECTOMY RIGHT SUPERFICIAL Alfredia Ferguson;  Surgeon: Maeola Harman, MD;  Location: [facility] OR;  Service: Vascular;  Laterality: Right;  . FEMORAL-POPLITEAL BYPASS GRAFT Right 08/18/2017   Procedure: BYPASS GRAFT RIGHT COMMON FEMORAL TO ABOVE KNEE POPLITEAL ARTERY USING RIGHT REVERSED GREAT SAPHENOUS  VEIN;  Surgeon: Maeola Harman, MD;  Location: [facility] OR;  Service: Vascular;  Laterality: Right;  . LOWER EXTREMITY ANGIOGRAPHY Right 08/17/2017   Procedure: Lower Extremity Angiography;  Surgeon: Maeola Harman, MD;  Location: [facility] INVASIVE CV LAB;  Service: Cardiovascular;  Laterality: Right;  . PERIPHERAL VASCULAR BALLOON ANGIOPLASTY Right 08/17/2017   Procedure: PERIPHERAL VASCULAR BALLOON ANGIOPLASTY;  Surgeon: Maeola Harman, MD;  Location: [facility] INVASIVE CV LAB;  Service: Cardiovascular;  Laterality: Right;  SFA UNABLE TO CROSS  . VEIN HARVEST Right 08/18/2017   Procedure: VEIN HARVEST RIGHT GREAT SAPHENOUS;  Surgeon: Maeola Harman, MD;  Location: [facility] OR;  Service: Vascular;  Laterality: Right;  . WOUND DEBRIDEMENT Right 08/18/2017   Procedure: DEBRIDEMENT WOUND RIGHT FOOT;  Surgeon: Maeola Harman, MD;  Location: [facility] OR;  Service: Vascular;  Laterality: Right;   Social History   Occupational History  . Not on file  Tobacco Use  . Smoking status: Former Games developer  . Smokeless tobacco: Never Used  Substance and Sexual Activity  . Alcohol use: Not Currently  . Drug use: No  . Sexual activity: Not on file

## 2017-09-07 ENCOUNTER — Encounter (INDEPENDENT_AMBULATORY_CARE_PROVIDER_SITE_OTHER): Payer: Self-pay | Admitting: Family

## 2017-09-07 ENCOUNTER — Ambulatory Visit (INDEPENDENT_AMBULATORY_CARE_PROVIDER_SITE_OTHER): Payer: Medicare Other | Admitting: Family

## 2017-09-07 DIAGNOSIS — Z89431 Acquired absence of right foot: Secondary | ICD-10-CM

## 2017-09-07 NOTE — Progress Notes (Signed)
Office Visit Note   Patient: Jorge Clements           Date of Birth: 08-Mar-1946           MRN: [id number] Visit Date: 09/07/2017              Requested by: Pearson Grippe, MD [address] [place], [place] [address] PCP: Pearson Grippe, MD  Chief Complaint  Patient presents with  . Right Foot - Routine Post Op      HPI: Patient is a 72 year old gentleman who is 3 week status post transmetatarsal amputation right foot. he is status post revascularization by vascular vein surgery.  Assessment & Plan: Visit Diagnoses:  No diagnosis found.  Plan: Recommended elevation for the right lower extremity. To begin wearing compression stockings. Sutures harvested. Continue daily incisional cleansing and dry dressings. May advance weight bearing. Follow up in office in 2 weeks.   Follow-Up Instructions: No follow-ups on file.   Ortho Exam  Patient is alert, oriented, no adenopathy, well-dressed, normal affect, normal respiratory effort. Examination patient does have venostasis swelling in the right lower extremity the medial malleolar ulcer is healing well. Incision is well healed as well.  there is no redness no cellulitis no odor no drainage.  Discussed the importance of elevation.  Imaging: No results found. No images are attached to the encounter.  Labs: No results found for: HGBA1C, ESRSEDRATE, CRP, LABURIC, REPTSTATUS, GRAMSTAIN, CULT, LABORGA   Lab Results  Component Value Date   ALBUMIN 2.3 (L) 08/17/2017   ALBUMIN 2.1 (L) 08/15/2017    There is no height or weight on file to calculate BMI.  Orders:  No orders of the defined types were placed in this encounter.  No orders of the defined types were placed in this encounter.    Procedures: No procedures performed  Clinical Data: No additional findings.  ROS:  All other systems negative, except as noted in the HPI. Review of Systems  Constitutional: Negative for chills and fever.  Cardiovascular:  Positive for leg swelling.  Skin: Positive for wound.    Objective: Vital Signs: There were no vitals taken for this visit.  Specialty Comments:  No specialty comments available.  PMFS History: Patient Active Problem List   Diagnosis Date Noted  . Status post transmetatarsal amputation of right foot (HCC)   . Subacute osteomyelitis, right ankle and foot (HCC)   . Infected blister of foot 08/13/2017  . Pressure injury of skin 08/12/2017  . Weakness 08/11/2017  . COPD (chronic obstructive pulmonary disease) (HCC) 08/11/2017  . Cellulitis of right lower extremity 08/11/2017  . Fall at home, initial encounter 08/11/2017  . Thrombocytopenia (HCC) 08/11/2017  . Weakness generalized 08/11/2017  . Hypokalemia 08/11/2017  . Essential hypertension 08/11/2017   Past Medical History:  Diagnosis Date  . COPD (chronic obstructive pulmonary disease) (HCC)     History reviewed. No pertinent family history.  Past Surgical History:  Procedure Laterality Date  . ABDOMINAL AORTOGRAM N/A 08/17/2017   Procedure: ABDOMINAL AORTOGRAM;  Surgeon: Maeola Harman, MD;  Location: [facility] INVASIVE CV LAB;  Service: Cardiovascular;  Laterality: N/A;  . AMPUTATION Right 08/21/2017   Procedure: TRANSMETATARSAL AMPUTATION RIGHT FOOT;  Surgeon: Nadara Mustard, MD;  Location: [facility] OR;  Service: Orthopedics;  Laterality: Right;  . AMPUTATION TOE Right 08/18/2017   Procedure: AMPUTATION RIGHT SECOND AND THIRD TOES;  Surgeon: Maeola Harman, MD;  Location: [facility] OR;  Service: Vascular;  Laterality: Right;  . ENDARTERECTOMY FEMORAL  Right 08/18/2017   Procedure: ENDARTERECTOMY RIGHT SUPERFICIAL Alfredia Ferguson;  Surgeon: Maeola Harman, MD;  Location: [facility] OR;  Service: Vascular;  Laterality: Right;  . FEMORAL-POPLITEAL BYPASS GRAFT Right 08/18/2017   Procedure: BYPASS GRAFT RIGHT COMMON FEMORAL TO ABOVE KNEE POPLITEAL ARTERY USING RIGHT REVERSED GREAT SAPHENOUS VEIN;  Surgeon: Maeola Harman, MD;  Location: [facility] OR;  Service: Vascular;  Laterality: Right;  . LOWER EXTREMITY ANGIOGRAPHY Right 08/17/2017   Procedure: Lower Extremity Angiography;  Surgeon: Maeola Harman, MD;  Location: [facility] INVASIVE CV LAB;  Service: Cardiovascular;  Laterality: Right;  . PERIPHERAL VASCULAR BALLOON ANGIOPLASTY Right 08/17/2017   Procedure: PERIPHERAL VASCULAR BALLOON ANGIOPLASTY;  Surgeon: Maeola Harman, MD;  Location: [facility] INVASIVE CV LAB;  Service: Cardiovascular;  Laterality: Right;  SFA UNABLE TO CROSS  . VEIN HARVEST Right 08/18/2017   Procedure: VEIN HARVEST RIGHT GREAT SAPHENOUS;  Surgeon: Maeola Harman, MD;  Location: [facility] OR;  Service: Vascular;  Laterality: Right;  . WOUND DEBRIDEMENT Right 08/18/2017   Procedure: DEBRIDEMENT WOUND RIGHT FOOT;  Surgeon: Maeola Harman, MD;  Location: [facility] OR;  Service: Vascular;  Laterality: Right;   Social History   Occupational History  . Not on file  Tobacco Use  . Smoking status: Former Games developer  . Smokeless tobacco: Never Used  Substance and Sexual Activity  . Alcohol use: Not Currently  . Drug use: No  . Sexual activity: Not on file

## 2017-09-23 ENCOUNTER — Encounter: Payer: Medicare Other | Admitting: Vascular Surgery

## 2017-09-26 ENCOUNTER — Other Ambulatory Visit: Payer: Self-pay

## 2017-09-26 ENCOUNTER — Encounter (HOSPITAL_COMMUNITY): Payer: Self-pay | Admitting: *Deleted

## 2017-09-26 ENCOUNTER — Emergency Department (HOSPITAL_COMMUNITY)
Admission: EM | Admit: 2017-09-26 | Discharge: 2017-09-27 | Disposition: A | Discharge status: Home / Self Care | Payer: Medicare Other | Attending: Emergency Medicine | Admitting: Emergency Medicine

## 2017-09-26 ENCOUNTER — Telehealth: Payer: Self-pay | Admitting: Vascular Surgery

## 2017-09-26 DIAGNOSIS — R2241 Localized swelling, mass and lump, right lower limb: Secondary | ICD-10-CM | POA: Diagnosis present

## 2017-09-26 DIAGNOSIS — I10 Essential (primary) hypertension: Secondary | ICD-10-CM | POA: Insufficient documentation

## 2017-09-26 DIAGNOSIS — J449 Chronic obstructive pulmonary disease, unspecified: Secondary | ICD-10-CM | POA: Insufficient documentation

## 2017-09-26 DIAGNOSIS — M7989 Other specified soft tissue disorders: Secondary | ICD-10-CM

## 2017-09-26 DIAGNOSIS — Z87891 Personal history of nicotine dependence: Secondary | ICD-10-CM | POA: Insufficient documentation

## 2017-09-26 LAB — CBC WITH DIFFERENTIAL/PLATELET
Abs Immature Granulocytes: 0 10*3/uL (ref 0.0–0.1)
BASOS ABS: 0.1 10*3/uL (ref 0.0–0.1)
Basophils Relative: 1 %
EOS PCT: 4 %
Eosinophils Absolute: 0.3 10*3/uL (ref 0.0–0.7)
HCT: 38.4 % — ABNORMAL LOW (ref 39.0–52.0)
HEMOGLOBIN: 12.2 g/dL — AB (ref 13.0–17.0)
Immature Granulocytes: 1 %
LYMPHS PCT: 29 %
Lymphs Abs: 2.2 10*3/uL (ref 0.7–4.0)
MCH: 28.2 pg (ref 26.0–34.0)
MCHC: 31.8 g/dL (ref 30.0–36.0)
MCV: 88.7 fL (ref 78.0–100.0)
Monocytes Absolute: 0.6 10*3/uL (ref 0.1–1.0)
Monocytes Relative: 8 %
NEUTROS PCT: 57 %
Neutro Abs: 4.3 10*3/uL (ref 1.7–7.7)
Platelets: 136 10*3/uL — ABNORMAL LOW (ref 150–400)
RBC: 4.33 MIL/uL (ref 4.22–5.81)
RDW: 14.9 % (ref 11.5–15.5)
WBC: 7.5 10*3/uL (ref 4.0–10.5)

## 2017-09-26 LAB — COMPREHENSIVE METABOLIC PANEL
ALT: 14 U/L — ABNORMAL LOW (ref 17–63)
AST: 19 U/L (ref 15–41)
Albumin: 3.7 g/dL (ref 3.5–5.0)
Alkaline Phosphatase: 85 U/L (ref 38–126)
Anion gap: 9 (ref 5–15)
BILIRUBIN TOTAL: 0.8 mg/dL (ref 0.3–1.2)
BUN: 12 mg/dL (ref 6–20)
CO2: 26 mmol/L (ref 22–32)
Calcium: 9.5 mg/dL (ref 8.9–10.3)
Chloride: 101 mmol/L (ref 101–111)
Creatinine, Ser: 0.9 mg/dL (ref 0.61–1.24)
Glucose, Bld: 95 mg/dL (ref 65–99)
POTASSIUM: 3.7 mmol/L (ref 3.5–5.1)
Sodium: 136 mmol/L (ref 135–145)
TOTAL PROTEIN: 7.3 g/dL (ref 6.5–8.1)

## 2017-09-26 NOTE — ED Triage Notes (Signed)
+   pulses with doppler in triage.

## 2017-09-26 NOTE — ED Triage Notes (Signed)
Pt reports femoral bypass surgery and all toes amputated on the right lower extremity in the beginning of May. Pt states swelling in the leg and redness since yesterday. Denies fevers. Pt also reports a knot on the medial right knee area that is painful that he noticed today.

## 2017-09-26 NOTE — Telephone Encounter (Signed)
Barbara with [facility] called.  Says Pt's leg is red and warm to the touch and has a knot at the top of his thigh.  She is not sure if she can feel pedal pulses since the leg is so swollen.  Per Triage nurse, go to the ED.  Britta MccreedyBarbara will make pt aware.   Ashleigh E., LPN.

## 2017-09-27 ENCOUNTER — Emergency Department (HOSPITAL_BASED_OUTPATIENT_CLINIC_OR_DEPARTMENT_OTHER)
Admit: 2017-09-27 | Discharge: 2017-09-27 | Disposition: A | Discharge status: Home / Self Care | Payer: Medicare Other | Attending: Emergency Medicine | Admitting: Emergency Medicine

## 2017-09-27 DIAGNOSIS — M7989 Other specified soft tissue disorders: Secondary | ICD-10-CM

## 2017-09-27 DIAGNOSIS — M79609 Pain in unspecified limb: Secondary | ICD-10-CM | POA: Diagnosis not present

## 2017-09-27 MED ORDER — HYDROCODONE-ACETAMINOPHEN 5-325 MG PO TABS
2.0000 | ORAL_TABLET | Freq: Once | ORAL | Status: AC
  Administered 2017-09-27: 2 via ORAL
  Filled 2017-09-27: qty 2

## 2017-09-27 MED ORDER — DOXYCYCLINE HYCLATE 100 MG PO CAPS: 100.0000 mg | ORAL_CAPSULE | Freq: Two times a day (BID) | ORAL | 0 refills | Status: DC

## 2017-09-27 NOTE — Discharge Instructions (Signed)
It was my pleasure taking care of you today!   Please take all of your antibiotics until finished!  Please follow up with your vascular surgery team.   Return to ER for fever, new or worsening symptoms, any additional concerns.

## 2017-09-27 NOTE — ED Notes (Signed)
To vascular to doppler studies

## 2017-09-27 NOTE — ED Notes (Signed)
vasc here to see pt

## 2017-09-27 NOTE — Progress Notes (Signed)
*  Preliminary Results* Right lower extremity venous duplex completed. Patient has history of recent right fem-pop bypass using right reversed great saphenous vein 08/18/17.  Right lower extremity is negative for deep vein thrombosis. There is evidence of a small segment of superficial vein thrombosis involving the great saphenous vein at the knee where there is a palpable knot. There is no evidence of right Baker's cyst.  Incidental finding: there is a heterogenous avascular area of the right groin. Given the patient's recent history of right fem-pop bypass, this is possibly a hematoma.  Preliminary results discussed with Dr. Silverio LayYao.  09/27/2017 9:01 AM  Gertie FeyMichelle Casimer Russett, BS, RVT, RDCS, RDMS

## 2017-09-27 NOTE — ED Provider Notes (Signed)
[facility] Please see note for further details. Case discussed, plan agreed upon. Will follow up on pending LE ultrasound to rule out DVT. If DVT study negative, discharge home with recommendations for compression stockings / warm compresses / elevation / PCP. If +, will obtain CT chest to rule out PE.  No DVT on ultrasound, however does show fluid collection consistent with hematoma closer towards the groin and small segment of superficial vein thrombosis to the great saphenous vein at the knee where there is a palpable knot on exam.  Given recent femoropopliteal bypass, vascular was consulted.  Please see consultation note for full evaluation recommendations.  Recommend treating with Keflex and discussed symptomatic care.  Jorge Clements has an appointment with Dr. Pascal LuxKane on July 5 and encouraged to keep this appointment. Discussed plan with patient. Hx of PCN allergy which Jorge Clements reports as "severe rash" - Jorge Clements does not remember ever taking cephalosporin. Will treat with doxycyline instead given allergy. Patient understands home care instructions, follow up care and return precautions. All questions answered.    Jorge Clements, Jorge PicketJaime Pilcher, PA-C 09/27/17 1114    Charlynne PanderYao, Jorge Hsienta, MD 10/02/17 [phone]

## 2017-09-27 NOTE — Consult Note (Addendum)
Hospital Consult  Reason for Consult:  Edema/cellulitis RLE Requesting Physician:  Henderson CloudJamie Ward, PA-C MRN #:  [id number]  History of Present Illness: This is a 72 y.o. male seen in consultation for RLE edema and cellulitis worsening over the past couple days.  He is s/p R CFA endarterectomy and femoral to AK popliteal bypass with vein by Dr. Randie Heinzain 08/18/17.  Lower leg also started to become more painful which is why he presented to the ER last night.  Venous duplex negative for DVT, fluid collection R groin consistent with hematoma, SFTP noted R GSV lower leg.  He denies rest pain or active tissue ischemia RLE.  He was able to heal his R TMA performed by Dr. Lajoyce Cornersuda 08/21/17.  He denies fevers, chills, N/V.  He also denies any history of DVT or venous ulcerations R foot.    Past Medical History:  Diagnosis Date  . COPD (chronic obstructive pulmonary disease) (HCC)     Past Surgical History:  Procedure Laterality Date  . ABDOMINAL AORTOGRAM N/A 08/17/2017   Procedure: ABDOMINAL AORTOGRAM;  Surgeon: Maeola Harmanain, Brandon Georg Ang, MD;  [facility];  Service: Cardiovascular;  Laterality: N/A;  . AMPUTATION Right 08/21/2017   Procedure: TRANSMETATARSAL AMPUTATION RIGHT FOOT;  Surgeon: Nadara Mustarduda, Marcus V, MD;  [facility];  Service: Orthopedics;  Laterality: Right;  . AMPUTATION TOE Right 08/18/2017   Procedure: AMPUTATION RIGHT SECOND AND THIRD TOES;  Surgeon: Maeola Harmanain, Brandon Makhiya Coburn, MD;  [facility];  Service: Vascular;  Laterality: Right;  . ENDARTERECTOMY FEMORAL Right 08/18/2017   Procedure: ENDARTERECTOMY RIGHT SUPERFICIAL Alfredia FergusonFEMORAL/PROFUNDA;  Surgeon: Maeola Harmanain, Brandon Joshuah Minella, MD;  [facility];  Service: Vascular;  Laterality: Right;  . FEMORAL-POPLITEAL BYPASS GRAFT Right 08/18/2017   Procedure: BYPASS GRAFT RIGHT COMMON FEMORAL TO ABOVE KNEE POPLITEAL ARTERY USING RIGHT REVERSED GREAT SAPHENOUS VEIN;  Surgeon: Maeola Harmanain, Brandon Hardeep Reetz, MD;  [facility];  Service: Vascular;   Laterality: Right;  . LOWER EXTREMITY ANGIOGRAPHY Right 08/17/2017   Procedure: Lower Extremity Angiography;  Surgeon: Maeola Harmanain, Brandon Victorio Creeden, MD;  [facility];  Service: Cardiovascular;  Laterality: Right;  . PERIPHERAL VASCULAR BALLOON ANGIOPLASTY Right 08/17/2017   Procedure: PERIPHERAL VASCULAR BALLOON ANGIOPLASTY;  Surgeon: Maeola Harmanain, Brandon Rodney Wigger, MD;  [facility];  Service: Cardiovascular;  Laterality: Right;  SFA UNABLE TO CROSS  . VEIN HARVEST Right 08/18/2017   Procedure: VEIN HARVEST RIGHT GREAT SAPHENOUS;  Surgeon: Maeola Harmanain, Brandon Lindsey Demonte, MD;  [facility];  Service: Vascular;  Laterality: Right;  . WOUND DEBRIDEMENT Right 08/18/2017   Procedure: DEBRIDEMENT WOUND RIGHT FOOT;  Surgeon: Maeola Harmanain, Brandon Iasha Mccalister, MD;  [facility];  Service: Vascular;  Laterality: Right;    Allergies  Allergen Reactions  . Penicillins Hives and Rash    Childhood reaction Has patient had a PCN reaction causing immediate rash, facial/tongue/throat swelling, SOB or lightheadedness with hypotension: No PT DEVELOPED ## SEVERE ## RASH INVOLVING MUCUS MEMBRANES or SKIN NECROSIS: #  #  YES  #  # Has patient had a PCN reaction that required hospitalization: Unknown Has patient had a PCN reaction occurring within the last 10 years: No  . Clindamycin Rash    Diffuse rash across body    Prior to Admission medications   Medication Sig Start Date End Date Taking? Authorizing Provider  acetaminophen (TYLENOL) 325 MG tablet Take 1-2 tablets (325-650 mg total) by mouth every 6 (six) hours as needed for mild pain, fever or headache. 08/22/17   Hongalgi, Maximino GreenlandAnand D,  MD  aspirin 325 MG tablet Take 325 mg by mouth daily. As a buffered tablet antiplatelet    [provider]  docusate sodium (COLACE) 100 MG capsule Take 1 capsule (100 mg total) by mouth 2 (two) times daily. 08/22/17   Hongalgi, Maximino Greenland, MD  oxyCODONE (OXY IR/ROXICODONE) 5 MG immediate release tablet Take 1-2  tablets (5-10 mg total) by mouth every 6 (six) hours as needed for moderate pain or severe pain. 08/22/17   Hongalgi, Maximino Greenland, MD  polyethylene glycol (MIRALAX / GLYCOLAX) packet Take 17 g by mouth daily as needed for mild constipation. 08/22/17   Hongalgi, Maximino Greenland, MD  rosuvastatin (CRESTOR) 10 MG tablet Take 1 tablet (10 mg total) by mouth daily. 08/22/17   Elease Etienne, MD    Social History   Socioeconomic History  . Marital status: Divorced    Spouse name: Not on file  . Number of children: Not on file  . Years of education: Not on file  . Highest education level: Not on file  Occupational History  . Not on file  Social Needs  . Financial resource strain: Not on file  . Food insecurity:    Worry: Not on file    Inability: Not on file  . Transportation needs:    Medical: Not on file    Non-medical: Not on file  Tobacco Use  . Smoking status: Former Games developer  . Smokeless tobacco: Never Used  Substance and Sexual Activity  . Alcohol use: Not Currently  . Drug use: No  . Sexual activity: Not on file  Lifestyle  . Physical activity:    Days per week: Not on file    Minutes per session: Not on file  . Stress: Not on file  Relationships  . Social connections:    Talks on phone: Not on file    Gets together: Not on file    Attends religious service: Not on file    Active member of club or organization: Not on file    Attends meetings of clubs or organizations: Not on file    Relationship status: Not on file  . Intimate partner violence:    Fear of current or ex partner: Not on file    Emotionally abused: Not on file    Physically abused: Not on file    Forced sexual activity: Not on file  Other Topics Concern  . Not on file  Social History Narrative  . Not on file     No family history on file.  ROS: Otherwise negative unless mentioned in HPI  Physical Examination  Vitals:   09/27/17 0637 09/27/17 0728  BP: (!) 142/76 (!) 145/53  Pulse: 71 70  Resp: 16 20    Temp: 99 F (37.2 C)   SpO2: 100% 99%   Body mass index is 25.77 kg/m.  General:  WDWN in NAD Gait: Not observed HENT: WNL, normocephalic Pulmonary: normal non-labored breathing, without Rales, rhonchi,  wheezing Cardiac: RRR Abdomen:  soft, NT/ND, no masses Skin: without rashes Vascular Exam/Pulses: multiphasic DP and PTA by doppler R foot Extremities: without ischemic changes, without Gangrene , with cellulitis R lower leg circumferentially; without open wounds; palpable non pulsatile fluid collection R groin; pitting edema R lower leg; healed R TMA Musculoskeletal: no muscle wasting or atrophy  Neurologic: A&O X 3;  No focal weakness or paresthesias are detected; speech is fluent/normal Psychiatric:  The pt has Normal affect. Lymph:  Unremarkable  CBC    Component  Value Date/Time   WBC 7.5 09/26/2017 1935   RBC 4.33 09/26/2017 1935   HGB 12.2 (L) 09/26/2017 1935   HCT 38.4 (L) 09/26/2017 1935   HCT 32.8 (L) 08/12/2017 0822   PLT 136 (L) 09/26/2017 1935   MCV 88.7 09/26/2017 1935   MCH 28.2 09/26/2017 1935   MCHC 31.8 09/26/2017 1935   RDW 14.9 09/26/2017 1935   LYMPHSABS 2.2 09/26/2017 1935   MONOABS 0.6 09/26/2017 1935   EOSABS 0.3 09/26/2017 1935   BASOSABS 0.1 09/26/2017 1935    BMET    Component Value Date/Time   NA 136 09/26/2017 1935   K 3.7 09/26/2017 1935   CL 101 09/26/2017 1935   CO2 26 09/26/2017 1935   GLUCOSE 95 09/26/2017 1935   BUN 12 09/26/2017 1935   CREATININE 0.90 09/26/2017 1935   CALCIUM 9.5 09/26/2017 1935   GFRNONAA >60 09/26/2017 1935   GFRAA >60 09/26/2017 1935    COAGS: Lab Results  Component Value Date   INR 1.13 08/17/2017     Non-Invasive Vascular Imaging:   Right lower extremity venous duplex completed. Patient has history of recent right fem-pop bypass using right reversed great saphenous vein 08/18/17.  Right lower extremity is negative for deep vein thrombosis. There is evidence of a small segment of superficial  vein thrombosis involving the great saphenous vein at the knee where there is a palpable knot. There is no evidence of right Baker's cyst.  Incidental finding: there is a heterogenous avascular area of the right groin. Given the patient's recent history of right fem-pop bypass, this is possibly a hematoma.  Preliminary results discussed with Dr. Silverio Lay.  09/27/2017 9:01 AM  Gertie Fey, BS, RVT, RDCS, RDMS      Statin:  Yes.   Beta Blocker:  No. Aspirin:  Yes.   ACEI:  No. ARB:  No. CCB use:  No Other antiplatelets/anticoagulants:  No.   ASSESSMENT/PLAN: This is a 72 y.o. male with cellulitis R lower leg  Patent R fem-AK popliteal bypass with healed R TMA Venous duplex negative for DVT Non-pulsatile collection in R groin consistent with hematoma; no indication for surgical intervention at this time Recommended elevation and compression RLE Cellulitis treatment per primary team Dr. Edilia Bo will evaluate the patient later today  Emilie Rutter PA-C [facility] [phone]  I have interviewed the patient and examined the patient. I agree with the findings by the PA. His graft is patent.  He has evidence of chronic venous insufficiency and moderate right lower extremity swelling.  I have discussed this with the emergency room physician and he will be discharged on Keflex.  He has an appointment with Dr. Lemar Livings on July 5 and will keep this appointment and let us something changes and he needs to be seen sooner. The swollen area below his inferior incision I believe represents a remnant of the saphenous vein which was harvested.  Cari Caraway, MD [phone]

## 2017-09-27 NOTE — ED Provider Notes (Signed)
[facility] Provider Note   CSN: [id number] Arrival date & time: 09/26/17  1724     History   Chief Complaint Chief Complaint  Patient presents with  . Leg Swelling    HPI Jorge KusterMichael Clements is a 72 y.o. male.  Patient presents to the emergency department with a chief complaint of right lower extremity pain and swelling.  He states that his lower leg swelled up significantly over the past couple of days.  He reports some new redness and increased pain.  He reports having a recent surgical history remarkable for right fem/pop bypass and right transmetatarsal amputation.  He denies any fevers or chills.  He denies any new injuries.  His symptoms are worsened with palpation.  The history is provided by the patient. No language interpreter was used.    Past Medical History:  Diagnosis Date  . COPD (chronic obstructive pulmonary disease) [facility](HCC)     Patient Active Problem List   Diagnosis Date Noted  . Status post transmetatarsal amputation of right foot (HCC)   . Subacute osteomyelitis, right ankle and foot (HCC)   . Infected blister of foot 08/13/2017  . Pressure injury of skin 08/12/2017  . Weakness 08/11/2017  . COPD (chronic obstructive pulmonary disease) (HCC) 08/11/2017  . Cellulitis of right lower extremity 08/11/2017  . Fall at home, initial encounter 08/11/2017  . Thrombocytopenia (HCC) 08/11/2017  . Weakness generalized 08/11/2017  . Hypokalemia 08/11/2017  . Essential hypertension 08/11/2017    Past Surgical History:  Procedure Laterality Date  . ABDOMINAL AORTOGRAM N/A 08/17/2017   Procedure: ABDOMINAL AORTOGRAM;  Surgeon: Maeola Harmanain, Brandon Christopher, MD;  [facility];  Service: Cardiovascular;  Laterality: N/A;  . AMPUTATION Right 08/21/2017   Procedure: TRANSMETATARSAL AMPUTATION RIGHT FOOT;  Surgeon: Nadara Mustarduda, Marcus V, MD;  [facility];  Service: Orthopedics;  Laterality: Right;  . AMPUTATION TOE Right 08/18/2017    Procedure: AMPUTATION RIGHT SECOND AND THIRD TOES;  Surgeon: Maeola Harmanain, Brandon Christopher, MD;  [facility];  Service: Vascular;  Laterality: Right;  . ENDARTERECTOMY FEMORAL Right 08/18/2017   Procedure: ENDARTERECTOMY RIGHT SUPERFICIAL Alfredia FergusonFEMORAL/PROFUNDA;  Surgeon: Maeola Harmanain, Brandon Christopher, MD;  [facility];  Service: Vascular;  Laterality: Right;  . FEMORAL-POPLITEAL BYPASS GRAFT Right 08/18/2017   Procedure: BYPASS GRAFT RIGHT COMMON FEMORAL TO ABOVE KNEE POPLITEAL ARTERY USING RIGHT REVERSED GREAT SAPHENOUS VEIN;  Surgeon: Maeola Harmanain, Brandon Christopher, MD;  [facility];  Service: Vascular;  Laterality: Right;  . LOWER EXTREMITY ANGIOGRAPHY Right 08/17/2017   Procedure: Lower Extremity Angiography;  Surgeon: Maeola Harmanain, Brandon Christopher, MD;  [facility];  Service: Cardiovascular;  Laterality: Right;  . PERIPHERAL VASCULAR BALLOON ANGIOPLASTY Right 08/17/2017   Procedure: PERIPHERAL VASCULAR BALLOON ANGIOPLASTY;  Surgeon: Maeola Harmanain, Brandon Christopher, MD;  [facility];  Service: Cardiovascular;  Laterality: Right;  SFA UNABLE TO CROSS  . VEIN HARVEST Right 08/18/2017   Procedure: VEIN HARVEST RIGHT GREAT SAPHENOUS;  Surgeon: Maeola Harmanain, Brandon Christopher, MD;  [facility];  Service: Vascular;  Laterality: Right;  . WOUND DEBRIDEMENT Right 08/18/2017   Procedure: DEBRIDEMENT WOUND RIGHT FOOT;  Surgeon: Maeola Harmanain, Brandon Christopher, MD;  [facility];  Service: Vascular;  Laterality: Right;        Home Medications    Prior to Admission medications   Medication Sig Start Date End Date Taking? Authorizing Provider  acetaminophen (TYLENOL) 325 MG tablet Take 1-2 tablets (325-650 mg total) by mouth every 6 (six) hours as needed  for mild pain, fever or headache. 08/22/17   Hongalgi, Maximino Greenland, MD  aspirin 325 MG tablet Take 325 mg by mouth daily. As a buffered tablet antiplatelet    [provider]  docusate sodium (COLACE) 100 MG capsule Take 1 capsule (100 mg  total) by mouth 2 (two) times daily. 08/22/17   Hongalgi, Maximino Greenland, MD  oxyCODONE (OXY IR/ROXICODONE) 5 MG immediate release tablet Take 1-2 tablets (5-10 mg total) by mouth every 6 (six) hours as needed for moderate pain or severe pain. 08/22/17   Hongalgi, Maximino Greenland, MD  polyethylene glycol (MIRALAX / GLYCOLAX) packet Take 17 g by mouth daily as needed for mild constipation. 08/22/17   Hongalgi, Maximino Greenland, MD  rosuvastatin (CRESTOR) 10 MG tablet Take 1 tablet (10 mg total) by mouth daily. 08/22/17   Hongalgi, Maximino Greenland, MD    Family History No family history on file.  Social History Social History   Tobacco Use  . Smoking status: Former Games developer  . Smokeless tobacco: Never Used  Substance Use Topics  . Alcohol use: Not Currently  . Drug use: No     Allergies   Penicillins and Clindamycin   Review of Systems Review of Systems  All other systems reviewed and are negative.    Physical Exam Updated Vital Signs BP (!) 188/78 (BP Location: Right Arm)   Pulse 77   Temp 97.7 F (36.5 C) (Oral)   Resp 18   Ht 6' (1.829 m)   Wt 86.2 kg (190 lb)   SpO2 100%   BMI 25.77 kg/m   Physical Exam  Constitutional: He is oriented to person, place, and time. He appears well-developed and well-nourished.  HENT:  Head: Normocephalic and atraumatic.  Eyes: Pupils are equal, round, and reactive to light. Conjunctivae and EOM are normal. Right eye exhibits no discharge. Left eye exhibits no discharge. No scleral icterus.  Neck: Normal range of motion. Neck supple. No JVD present.  Cardiovascular: Normal rate, regular rhythm and normal heart sounds. Exam reveals no gallop and no friction rub.  No murmur heard. Pulmonary/Chest: Effort normal and breath sounds normal. No respiratory distress. He has no wheezes. He has no rales. He exhibits no tenderness.  Abdominal: Soft. He exhibits no distension and no mass. There is no tenderness. There is no rebound and no guarding.  Musculoskeletal: Normal range of  motion. He exhibits no edema or tenderness.  Neurological: He is alert and oriented to person, place, and time.  Skin: Skin is warm and dry.  Psychiatric: He has a normal mood and affect. His behavior is normal. Judgment and thought content normal.  Nursing note and vitals reviewed.      ED Treatments / Results  Labs (all labs ordered are listed, but only abnormal results are displayed) Labs Reviewed  COMPREHENSIVE METABOLIC PANEL - Abnormal; Notable for the following components:      Result Value   ALT 14 (*)    All other components within normal limits  CBC WITH DIFFERENTIAL/PLATELET - Abnormal; Notable for the following components:   Hemoglobin 12.2 (*)    HCT 38.4 (*)    Platelets 136 (*)    All other components within normal limits    EKG None  Radiology No results found.  Procedures Procedures (including critical care time)  Medications Ordered in ED Medications - No data to display   Initial Impression / Assessment and Plan / ED Course  I have reviewed the triage vital signs and the nursing notes.  Pertinent labs & imaging results that were available during my care of the patient were reviewed by me and considered in my medical decision making (see chart for details).     Patient with right lower extremity swelling.,  Worse over the past couple of days.  There is mild erythema, but it does not appear cellulitic in nature.  No leukocytosis, no fever.  Suspicion is greater for DVT.  Will order lower extremity Doppler ultrasound.  No CP or SOB.  Patient signed out to Ward, PA-C, who will follow-up on DVT study.    Final Clinical Impressions(s) / ED Diagnoses   Final diagnoses:  None    ED Discharge Orders    None       Roxy Horseman, PA-C 09/27/17 [id number]    Glynn Octave, MD 09/27/17 [phone]

## 2017-10-04 ENCOUNTER — Telehealth: Payer: Self-pay | Admitting: *Deleted

## 2017-10-04 NOTE — Telephone Encounter (Signed)
Summer, nurse with [facility] called stating patient has a 2 cm open wound to his right calf.  Summer requested to place a dressing of alginate and foam until patient's next appointment, 10/21/2017.  A verbal order was given.

## 2017-10-04 NOTE — Telephone Encounter (Signed)
Correction on patients' upcoming appointment :  10/19/2017.

## 2017-10-05 ENCOUNTER — Ambulatory Visit (INDEPENDENT_AMBULATORY_CARE_PROVIDER_SITE_OTHER): Payer: Medicare Other | Admitting: Orthopedic Surgery

## 2017-10-05 ENCOUNTER — Ambulatory Visit (INDEPENDENT_AMBULATORY_CARE_PROVIDER_SITE_OTHER): Payer: Medicare Other | Admitting: Family

## 2017-10-07 ENCOUNTER — Encounter: Payer: Self-pay | Admitting: Vascular Surgery

## 2017-10-07 ENCOUNTER — Ambulatory Visit (INDEPENDENT_AMBULATORY_CARE_PROVIDER_SITE_OTHER): Payer: Self-pay | Admitting: Vascular Surgery

## 2017-10-07 VITALS — BP 145/75 | HR 72 | Resp 16 | Ht 72.0 in | Wt 208.8 lb

## 2017-10-07 DIAGNOSIS — I739 Peripheral vascular disease, unspecified: Secondary | ICD-10-CM

## 2017-10-07 NOTE — Progress Notes (Signed)
  Subjective:     Patient ID: Jorge KusterMichael Saber, male   DOB: 07/12/1945, 72 y.o.   MRN: [id number]  HPI 72 year old male recently underwent right lower extremity bypass grafting in transmetatarsal amputation.  He now has extreme swelling of his right lower extremity.  He has been evaluated in the emergency department with negative DVT study.  He is not having too much pain assist the size of the leg is limiting his ambulation.  His TMA is healed well.   Review of Systems Right lower extremity swelling    Objective:   Physical Exam Awake alert and oriented Nonlabored respirations Right leg incisions are healing well Strong signals at his ankle posterior tibial and dorsalis pedis arteries       Assessment/plan     72 year old male follows up with extreme swelling of his right lower extremity relative to his left.  He has a negative DVT study and this is likely secondary to his recent revascularization.  He has healed his TMA well at this time.  We will get him to follow-up in a few months with evaluation of his wound as well as his bypass graft with duplex.  I wrapped his leg today with Ace wrap and instructed him to keep it elevated and wrapped as much as possible.  He demonstrates good understanding.       Melrose Kearse C. Randie Heinzain, MD [facility] [place] Office: [phone] Pager: [phone]

## 2017-10-11 ENCOUNTER — Telehealth: Payer: Self-pay

## 2017-10-11 NOTE — Telephone Encounter (Signed)
Received voice mail form Kim RN [facility] stating that Jorge Clements was requesting a refill on pain medication and needed someone to give him a call. S/w pt and he stated that his pain in his R foot was a 5 on a 0-10 scale. Stated that his pain has remained at a 5 since the surgery and this was nothing new. Pt stated he had two RX for pain medication but lost one. I explained to the pt that I was only able to see the one RX for Oxycodone that was written on 08/22/17. Made pt aware that he wanted a refill on his pain med he would have to be seen in the office by one of our providers. Pt stated he was having some mild swelling but was doing ok otherwise. Pt stated "just forget it, I can deal with the pain" I instructed him that if his pain became unbearable and/or he starts to show signs of infection he should report to the ED. Pt verbalized understanding and agreed with this plan. F/u call was made to Selena BattenKim explaining our conversation.

## 2017-10-19 ENCOUNTER — Other Ambulatory Visit: Payer: Self-pay

## 2017-10-19 ENCOUNTER — Encounter: Payer: Self-pay | Admitting: Vascular Surgery

## 2017-10-19 ENCOUNTER — Ambulatory Visit (INDEPENDENT_AMBULATORY_CARE_PROVIDER_SITE_OTHER): Payer: Self-pay | Admitting: Vascular Surgery

## 2017-10-19 VITALS — BP 138/63 | HR 75 | Temp 98.1°F | Resp 16 | Ht 72.0 in | Wt 208.0 lb

## 2017-10-19 DIAGNOSIS — I739 Peripheral vascular disease, unspecified: Secondary | ICD-10-CM

## 2017-10-19 NOTE — Progress Notes (Signed)
  Subjective:     Patient ID: Sallyanne KusterMichael Gude, male   DOB: 10/24/1945, 72 y.o.   MRN: [id number]  HPI 72 year old man with recent right femoral to above-knee popliteal artery bypass with vein and had a TMA by Dr. Lajoyce Cornersuda which is now well-healed.  Last visit he was having significant swelling of the right lower extremity.  He has since been elevating it and wrapping it with Ace bandages daily and has dramatic improvement now.  He has no other complaints.  He continues to walk with the help of a cane.   Review of Systems Leg swelling is improving    Objective:   Physical Exam Awake alert and oriented Nonlabored respirations All incisions in his right leg are healing well Strong signals DP PT on the right Edema dramatically improved right lower extremity    Assessment/plan     72 year old male returns having had significant swelling after bypass.  Edema is now improving.  Bypass is clinically patent and he has healed his TMA.  Overall he is doing well.  I will follow him up in 2 months with duplex and ABIs.  Brandon C. Randie Heinzain, MD [facility] [place] Office: [phone] Pager: [phone]

## 2017-10-24 ENCOUNTER — Other Ambulatory Visit: Payer: Self-pay

## 2017-10-24 DIAGNOSIS — I739 Peripheral vascular disease, unspecified: Secondary | ICD-10-CM

## 2017-11-04 ENCOUNTER — Telehealth (INDEPENDENT_AMBULATORY_CARE_PROVIDER_SITE_OTHER): Payer: Self-pay | Admitting: Orthopedic Surgery

## 2017-11-04 NOTE — Telephone Encounter (Signed)
Alvino Chapelllen with [facility] called requesting VO for the wound vac.  CB#[phone].  Thank you.

## 2017-11-04 NOTE — Telephone Encounter (Signed)
IC, no answer. No VM to LM

## 2017-11-16 ENCOUNTER — Other Ambulatory Visit: Payer: Self-pay | Admitting: Vascular Surgery

## 2017-11-16 NOTE — Progress Notes (Signed)
Encounter opened in error

## 2017-11-16 NOTE — Telephone Encounter (Addendum)
Rep with [facility] called to tell us the patient fell last night and has a cut over his eye but no additional problems. Dr. Randie Heinzain notified.  Ernst SpellAshleigh E., LPN

## 2017-12-30 ENCOUNTER — Ambulatory Visit (INDEPENDENT_AMBULATORY_CARE_PROVIDER_SITE_OTHER): Payer: Medicare Other | Admitting: Vascular Surgery

## 2017-12-30 ENCOUNTER — Other Ambulatory Visit: Payer: Self-pay

## 2017-12-30 ENCOUNTER — Encounter: Payer: Self-pay | Admitting: Vascular Surgery

## 2017-12-30 ENCOUNTER — Ambulatory Visit (HOSPITAL_COMMUNITY)
Admission: RE | Admit: 2017-12-30 | Discharge: 2017-12-30 | Disposition: A | Discharge status: Home / Self Care | Payer: Medicare Other | Source: Ambulatory Visit | Attending: Vascular Surgery | Admitting: Vascular Surgery

## 2017-12-30 ENCOUNTER — Ambulatory Visit (INDEPENDENT_AMBULATORY_CARE_PROVIDER_SITE_OTHER)
Admission: RE | Admit: 2017-12-30 | Discharge: 2017-12-30 | Disposition: A | Discharge status: Home / Self Care | Payer: Medicare Other | Source: Ambulatory Visit | Attending: Vascular Surgery | Admitting: Vascular Surgery

## 2017-12-30 VITALS — BP 135/69 | HR 62 | Temp 96.9°F | Resp 16 | Ht 73.0 in | Wt 229.0 lb

## 2017-12-30 DIAGNOSIS — Z87891 Personal history of nicotine dependence: Secondary | ICD-10-CM | POA: Insufficient documentation

## 2017-12-30 DIAGNOSIS — I739 Peripheral vascular disease, unspecified: Secondary | ICD-10-CM

## 2017-12-30 DIAGNOSIS — I10 Essential (primary) hypertension: Secondary | ICD-10-CM | POA: Insufficient documentation

## 2017-12-30 NOTE — Progress Notes (Signed)
Patient ID: Suleyman Ehrman, male   DOB: Nov 29, 1945, 72 y.o.   MRN: [id number]  Reason for Consult: PAD (f/u)   Referred by Pearson Grippe, MD  Subjective:     HPI:  Crawford Tamura is a 72 y.o. male with history of bilateral lower extremity stenting.  He recently underwent right femoral to above-knee popliteal bypass graft and right TMA by Dr. Lajoyce Corners.  In follow-up he had significant swelling of his right lower extremity DVT study was negative.  He now presents today with right lower extremity bypass graft duplex.  He is walking with the help of a cane.  He still has swelling of his leg with some venous changes but no ulceration.  He is getting around fine.  He is quit drinking and smoking last November he did this cold [place].  Overall he is doing well.  He has no left lower extremity complaints.  He is never had stroke TIA or amaurosis.  He is taking aspirin and Crestor daily  Past Medical History:  Diagnosis Date  . COPD (chronic obstructive pulmonary disease) (HCC)    History reviewed. No pertinent family history. Past Surgical History:  Procedure Laterality Date  . ABDOMINAL AORTOGRAM N/A 08/17/2017   Procedure: ABDOMINAL AORTOGRAM;  Surgeon: Maeola Harman, MD;  Location[facility] INVASIVE CV LAB;  Service: Cardiovascular;  Laterality: N/A;  . AMPUTATION Right 08/21/2017   Procedure: TRANSMETATARSAL AMPUTATION RIGHT FOOT;  Surgeon: Nadara Mustard, MD;  Location: [facility] OR;  Service: Orthopedics;  Laterality: Right;  . AMPUTATION TOE Right 08/18/2017   Procedure: AMPUTATION RIGHT SECOND AND THIRD TOES;  Surgeon: Maeola Harman, MD;  Location: [facility] OR;  Service: Vascular;  Laterality: Right;  . ENDARTERECTOMY FEMORAL Right 08/18/2017   Procedure: ENDARTERECTOMY RIGHT SUPERFICIAL Alfredia Ferguson;  Surgeon: Maeola Harman, MD;  Location: [facility] OR;  Service: Vascular;  Laterality: Right;  . FEMORAL-POPLITEAL BYPASS GRAFT Right 08/18/2017   Procedure: BYPASS GRAFT RIGHT COMMON FEMORAL  TO ABOVE KNEE POPLITEAL ARTERY USING RIGHT REVERSED GREAT SAPHENOUS VEIN;  Surgeon: Maeola Harman, MD;  Location: [facility] OR;  Service: Vascular;  Laterality: Right;  . LOWER EXTREMITY ANGIOGRAPHY Right 08/17/2017   Procedure: Lower Extremity Angiography;  Surgeon: Maeola Harman, MD;  Location: [facility] INVASIVE CV LAB;  Service: Cardiovascular;  Laterality: Right;  . PERIPHERAL VASCULAR BALLOON ANGIOPLASTY Right 08/17/2017   Procedure: PERIPHERAL VASCULAR BALLOON ANGIOPLASTY;  Surgeon: Maeola Harman, MD;  Location: [facility] INVASIVE CV LAB;  Service: Cardiovascular;  Laterality: Right;  SFA UNABLE TO CROSS  . VEIN HARVEST Right 08/18/2017   Procedure: VEIN HARVEST RIGHT GREAT SAPHENOUS;  Surgeon: Maeola Harman, MD;  Location: [facility] OR;  Service: Vascular;  Laterality: Right;  . WOUND DEBRIDEMENT Right 08/18/2017   Procedure: DEBRIDEMENT WOUND RIGHT FOOT;  Surgeon: Maeola Harman, MD;  Location: [facility] OR;  Service: Vascular;  Laterality: Right;    Short Social History:  Social History   Tobacco Use  . Smoking status: Former Games developer  . Smokeless tobacco: Never Used  Substance Use Topics  . Alcohol use: Not Currently    Allergies  Allergen Reactions  . Penicillins Hives and Rash    Childhood reaction Has patient had a PCN reaction causing immediate rash, facial/tongue/throat swelling, SOB or lightheadedness with hypotension: No PT DEVELOPED ## SEVERE ## RASH INVOLVING MUCUS MEMBRANES or SKIN NECROSIS: #  #  YES  #  # Has patient had a PCN reaction that required hospitalization: Unknown Has patient had a PCN reaction occurring within  the last 10 years: No  . Clindamycin Rash    Diffuse rash across body    Current Outpatient Medications  Medication Sig Dispense Refill  . aspirin 325 MG tablet Take 325 mg by mouth daily. As a buffered tablet antiplatelet    . Multiple Vitamins-Minerals (CENTRUM SILVER 50+MEN PO) Take by mouth.    . rosuvastatin (CRESTOR) 10  MG tablet Take 1 tablet (10 mg total) by mouth daily.     No current facility-administered medications for this visit.     Review of Systems  Constitutional:  Constitutional negative. HENT: HENT negative.  Eyes: Eyes negative.  Respiratory: Respiratory negative.  Cardiovascular: Positive for leg swelling.  GI: Gastrointestinal negative.  Musculoskeletal: Positive for leg pain.  Skin: Positive for rash.  Neurological: Neurological negative. Hematologic: Hematologic/lymphatic negative.  Psychiatric: Psychiatric negative.        Objective:  Objective   Vitals:   12/30/17 1500 12/30/17 1504  BP: (!) 143/72 135/69  Pulse: 62 62  Resp: 16   Temp: (!) 96.9 F (36.1 C)   TempSrc: Oral   SpO2: 97%   Weight: 229 lb (103.9 kg)   Height: 6\' 1"  (1.854 m)    Body mass index is 30.21 kg/m.  Physical Exam  Constitutional: He is oriented to person, place, and time. He appears well-developed.  HENT:  Head: Normocephalic.  Eyes: Pupils are equal, round, and reactive to light.  Neck: Normal range of motion. Neck supple.  Cardiovascular: Normal rate and normal heart sounds.  Pulses:      Radial pulses are 2+ on the right side, and 3+ on the left side.  No carotid bruits Strong posterior tib signal on the right side  Abdominal: Soft.  Musculoskeletal: He exhibits edema.  He has skin changes and swelling right lower extremity  Neurological: He is oriented to person, place, and time.  Skin: Skin is warm and dry. Capillary refill takes less than 2 seconds.  Psychiatric: He has a normal mood and affect. His behavior is normal. Judgment and thought content normal.    Data: I independently interpreted his bilateral lower extremity ABIs to be 0.87 right and 0.68 left with toe pressure on the left 64.  All signals are monophasic.  His bypass graft duplex was also interpreted which demonstrates a focal elevation of velocity 380 but distally there are no low velocities to suggest impending  graft failure     Assessment/Plan:     72 year old male status post right femoropopliteal bypass with vein and subsequent TMA which is now healed.  He does have persistent swelling of the right lower extremity.  I have recommended gentle compression of the right lower extremity given his ABI of 0.8.  He will follow-up in 6 months with repeat ABIs.  He does have a stent in his left SFA that will need to be evaluated at some point in time as well.  Overall he is doing well without complication other than swelling at this time.     Maeola HarmanBrandon Christopher Neeko Pharo MD [facility]

## 2017-12-30 NOTE — Progress Notes (Signed)
Vitals:   12/30/17 1500  BP: (!) 143/72  Pulse: 62  Resp: 16  Temp: (!) 96.9 F (36.1 C)  TempSrc: Oral  SpO2: 97%  Weight: 229 lb (103.9 kg)  Height: 6\' 1"  (1.854 m)

## 2018-03-20 DIAGNOSIS — E782 Mixed hyperlipidemia: Secondary | ICD-10-CM | POA: Diagnosis not present

## 2018-03-20 DIAGNOSIS — R69 Illness, unspecified: Secondary | ICD-10-CM | POA: Diagnosis not present

## 2018-03-20 DIAGNOSIS — I739 Peripheral vascular disease, unspecified: Secondary | ICD-10-CM | POA: Diagnosis not present

## 2018-03-20 DIAGNOSIS — Z89429 Acquired absence of other toe(s), unspecified side: Secondary | ICD-10-CM | POA: Diagnosis not present

## 2018-03-20 DIAGNOSIS — Z23 Encounter for immunization: Secondary | ICD-10-CM | POA: Diagnosis not present

## 2018-03-20 DIAGNOSIS — I10 Essential (primary) hypertension: Secondary | ICD-10-CM | POA: Diagnosis not present

## 2018-03-20 DIAGNOSIS — Z6835 Body mass index (BMI) 35.0-35.9, adult: Secondary | ICD-10-CM | POA: Diagnosis not present

## 2018-03-21 ENCOUNTER — Other Ambulatory Visit (HOSPITAL_COMMUNITY): Payer: Self-pay | Admitting: Internal Medicine

## 2018-03-21 ENCOUNTER — Other Ambulatory Visit (HOSPITAL_COMMUNITY): Payer: Self-pay | Admitting: Respiratory Therapy

## 2018-03-21 DIAGNOSIS — Z87891 Personal history of nicotine dependence: Secondary | ICD-10-CM

## 2018-03-21 DIAGNOSIS — R0602 Shortness of breath: Secondary | ICD-10-CM

## 2018-03-21 DIAGNOSIS — F17200 Nicotine dependence, unspecified, uncomplicated: Secondary | ICD-10-CM

## 2018-04-07 DIAGNOSIS — Z1321 Encounter for screening for nutritional disorder: Secondary | ICD-10-CM | POA: Diagnosis not present

## 2018-04-07 DIAGNOSIS — Z6835 Body mass index (BMI) 35.0-35.9, adult: Secondary | ICD-10-CM | POA: Diagnosis not present

## 2018-04-07 DIAGNOSIS — Z89429 Acquired absence of other toe(s), unspecified side: Secondary | ICD-10-CM | POA: Diagnosis not present

## 2018-04-07 DIAGNOSIS — R69 Illness, unspecified: Secondary | ICD-10-CM | POA: Diagnosis not present

## 2018-04-07 DIAGNOSIS — R7301 Impaired fasting glucose: Secondary | ICD-10-CM | POA: Diagnosis not present

## 2018-04-07 DIAGNOSIS — Z23 Encounter for immunization: Secondary | ICD-10-CM | POA: Diagnosis not present

## 2018-04-07 DIAGNOSIS — Z136 Encounter for screening for cardiovascular disorders: Secondary | ICD-10-CM | POA: Diagnosis not present

## 2018-04-07 DIAGNOSIS — I739 Peripheral vascular disease, unspecified: Secondary | ICD-10-CM | POA: Diagnosis not present

## 2018-04-07 DIAGNOSIS — E782 Mixed hyperlipidemia: Secondary | ICD-10-CM | POA: Diagnosis not present

## 2018-04-07 DIAGNOSIS — Z122 Encounter for screening for malignant neoplasm of respiratory organs: Secondary | ICD-10-CM | POA: Diagnosis not present

## 2018-04-07 DIAGNOSIS — I10 Essential (primary) hypertension: Secondary | ICD-10-CM | POA: Diagnosis not present

## 2018-04-14 DIAGNOSIS — D696 Thrombocytopenia, unspecified: Secondary | ICD-10-CM | POA: Diagnosis not present

## 2018-04-14 DIAGNOSIS — L97319 Non-pressure chronic ulcer of right ankle with unspecified severity: Secondary | ICD-10-CM | POA: Diagnosis not present

## 2018-04-14 DIAGNOSIS — I7025 Atherosclerosis of native arteries of other extremities with ulceration: Secondary | ICD-10-CM | POA: Diagnosis not present

## 2018-04-14 DIAGNOSIS — L03115 Cellulitis of right lower limb: Secondary | ICD-10-CM | POA: Diagnosis not present

## 2018-04-14 DIAGNOSIS — R7303 Prediabetes: Secondary | ICD-10-CM | POA: Diagnosis not present

## 2018-04-14 DIAGNOSIS — R7301 Impaired fasting glucose: Secondary | ICD-10-CM | POA: Diagnosis not present

## 2018-04-14 DIAGNOSIS — Z89421 Acquired absence of other right toe(s): Secondary | ICD-10-CM | POA: Diagnosis not present

## 2018-04-28 DIAGNOSIS — Z89429 Acquired absence of other toe(s), unspecified side: Secondary | ICD-10-CM | POA: Diagnosis not present

## 2018-04-28 DIAGNOSIS — R69 Illness, unspecified: Secondary | ICD-10-CM | POA: Diagnosis not present

## 2018-04-28 DIAGNOSIS — Z122 Encounter for screening for malignant neoplasm of respiratory organs: Secondary | ICD-10-CM | POA: Diagnosis not present

## 2018-04-28 DIAGNOSIS — I739 Peripheral vascular disease, unspecified: Secondary | ICD-10-CM | POA: Diagnosis not present

## 2018-04-28 DIAGNOSIS — L97319 Non-pressure chronic ulcer of right ankle with unspecified severity: Secondary | ICD-10-CM | POA: Diagnosis not present

## 2018-04-28 DIAGNOSIS — I10 Essential (primary) hypertension: Secondary | ICD-10-CM | POA: Diagnosis not present

## 2018-04-28 DIAGNOSIS — Z6835 Body mass index (BMI) 35.0-35.9, adult: Secondary | ICD-10-CM | POA: Diagnosis not present

## 2018-04-28 DIAGNOSIS — E782 Mixed hyperlipidemia: Secondary | ICD-10-CM | POA: Diagnosis not present

## 2018-05-12 ENCOUNTER — Other Ambulatory Visit: Payer: Self-pay

## 2018-05-12 DIAGNOSIS — I739 Peripheral vascular disease, unspecified: Secondary | ICD-10-CM

## 2018-05-23 ENCOUNTER — Ambulatory Visit: Payer: Medicare Other | Admitting: Family

## 2018-05-23 ENCOUNTER — Encounter (HOSPITAL_COMMUNITY): Payer: Medicare Other

## 2018-05-24 ENCOUNTER — Ambulatory Visit (HOSPITAL_COMMUNITY): Payer: Medicare HMO | Attending: Internal Medicine

## 2018-05-24 DIAGNOSIS — S91001A Unspecified open wound, right ankle, initial encounter: Secondary | ICD-10-CM | POA: Insufficient documentation

## 2018-05-24 DIAGNOSIS — S91301A Unspecified open wound, right foot, initial encounter: Secondary | ICD-10-CM | POA: Diagnosis present

## 2018-05-25 ENCOUNTER — Encounter (HOSPITAL_COMMUNITY): Payer: Self-pay

## 2018-05-25 NOTE — Therapy (Signed)
[facility] [address] [place], [place], [address] Phone: [phone]   Fax:  [phone]  Wound Care Evaluation  Patient Details  Name: Jorge Clements MRN: [id number] Date of Birth: 02/07/1946 Referring Provider (PT): Benita StabileHall, John Z, MD   Encounter Date: 05/24/2018  PT End of Session - 05/24/18 1712    Visit Number  1    Number of Visits  24    Date for PT Re-Evaluation  07/19/18   mini-reassess due 06/21/18   Authorization Type  [employer] medicare PPO ( no auth required, no visit limit)    Authorization Time Period  05/24/2018 - 07/21/2018    Authorization - Visit Number  1    Authorization - Number of Visits  10    PT Start Time  1433    PT Stop Time  1608    PT Time Calculation (min)  95 min    Activity Tolerance  Patient tolerated treatment well    Behavior During Therapy  [facility] for tasks assessed/performed       Past Medical History:  Diagnosis Date   COPD (chronic obstructive pulmonary disease) (HCC)     Past Surgical History:  Procedure Laterality Date   ABDOMINAL AORTOGRAM N/A 08/17/2017   Procedure: ABDOMINAL AORTOGRAM;  Surgeon: Maeola Harmanain, Brandon Christopher, MD;  [facility];  Service: Cardiovascular;  Laterality: N/A;   AMPUTATION Right 08/21/2017   Procedure: TRANSMETATARSAL AMPUTATION RIGHT FOOT;  Surgeon: Nadara Mustarduda, Marcus V, MD;  [facility];  Service: Orthopedics;  Laterality: Right;   AMPUTATION TOE Right 08/18/2017   Procedure: AMPUTATION RIGHT SECOND AND THIRD TOES;  Surgeon: Maeola Harmanain, Brandon Christopher, MD;  [facility];  Service: Vascular;  Laterality: Right;   ENDARTERECTOMY FEMORAL Right 08/18/2017   Procedure: ENDARTERECTOMY RIGHT SUPERFICIAL Alfredia FergusonFEMORAL/PROFUNDA;  Surgeon: Maeola Harmanain, Brandon Christopher, MD;  [facility];  Service: Vascular;  Laterality: Right;   FEMORAL-POPLITEAL BYPASS GRAFT Right 08/18/2017   Procedure: BYPASS GRAFT RIGHT COMMON FEMORAL TO ABOVE KNEE POPLITEAL ARTERY USING RIGHT REVERSED  GREAT SAPHENOUS VEIN;  Surgeon: Maeola Harmanain, Brandon Christopher, MD;  [facility];  Service: Vascular;  Laterality: Right;   LOWER EXTREMITY ANGIOGRAPHY Right 08/17/2017   Procedure: Lower Extremity Angiography;  Surgeon: Maeola Harmanain, Brandon Christopher, MD;  [facility];  Service: Cardiovascular;  Laterality: Right;   PERIPHERAL VASCULAR BALLOON ANGIOPLASTY Right 08/17/2017   Procedure: PERIPHERAL VASCULAR BALLOON ANGIOPLASTY;  Surgeon: Maeola Harmanain, Brandon Christopher, MD;  [facility];  Service: Cardiovascular;  Laterality: Right;  SFA UNABLE TO CROSS   VEIN HARVEST Right 08/18/2017   Procedure: VEIN HARVEST RIGHT GREAT SAPHENOUS;  Surgeon: Maeola Harmanain, Brandon Christopher, MD;  [facility];  Service: Vascular;  Laterality: Right;   WOUND DEBRIDEMENT Right 08/18/2017   Procedure: DEBRIDEMENT WOUND RIGHT FOOT;  Surgeon: Maeola Harmanain, Brandon Christopher, MD;  [facility];  Service: Vascular;  Laterality: Right;    There were no vitals filed for this visit.  Subjective Assessment - 05/24/18 1707    Subjective  Mr. Houp reports he has had wounds on his Rt foot/ankle for >1 year. He reports previously he had a "hole" in his Rt foot and this resulted in a trans-metatarsal amputation performed on 08/21/2017. He is being followed by a vascular surgeon to address his impaired circulation in bil LE's and has undergone 2 stents in Rt LE, 1 stent in Lt LE, and last May had a femoral-popliteal bypass graft. He reports he followed up initiatlly with Dr. Lajoyce Cornersuda  who performed the amputation to remove the stitches, but has not followed up since. He beieves his last ABI on the Rt LE was 0.74 (chart review indicates 0.87 on Rt LE and 0.64 on Lt LE on 12/30/17). He Reports he has ABI's every ~3 months. Patient denies any education on compression garments to manage Rt LE edema which he reports has been present for > 1 year as well (chart review reveals Dr. Randie Heinz recommended light compression as Rt LE ABI is 0.87)  He has not been performing any self-care to manage wounds and patient reports he is bathing infrequently as it is challenging. He denies pain and states he has had difficulty feeling his LE for ~ 3 years.    Limitations  Walking    Patient Stated Goals  wound to heal    Currently in Pain?  No/denies       [facility] PT Assessment - 05/24/18 1700      Assessment   Medical Diagnosis  Right Lateral ankle and Foot Wound    Referring Provider (PT)  Benita Stabile, MD    Onset Date/Surgical Date  --   > 1 year ago   Next MD Visit  06/16/2018   With Dr. Randie Heinz   Prior Therapy  none      Precautions   Precautions  Other (comment)    Precaution Comments  peripheral arterial disease (stenting to correct, ABI: Rt LE = 0.87 last documented, Lt LE = 0.64 last documented)      Restrictions   Weight Bearing Restrictions  No      Balance Screen   Has the patient fallen in the past 6 months  No    Has the patient had a decrease in activity level because of a fear of falling?   No    Is the patient reluctant to leave their home because of a fear of falling?   No      Home Environment   Living Environment  Private residence    Living Arrangements  Alone    Available Help at Discharge  Other (Comment)   patient mentions ex-wife several times   Home Equipment  Cane - single point    Additional Comments  Patient used to have a shower chair but no longer does. He reports showering is difficult for him and he does not perform this task daily.       Prior Function   Level of Independence  Independent      Cognition   Overall Cognitive Status  Within Functional Limits for tasks assessed      Wound Therapy - 05/24/18 1705    Subjective  Mr. Bourassa reports he has had wounds on his Rt foot/ankle for >1 year. He reports previously he had a hole in his Rt foot and this resulted in a trans-metatarsal amputation performed on 08/21/2017. He is being followed by a vascular surgeon to address his impaired  circulation in bil LEs and has undergone 2 stents in Rt LE, 1 stent in Lt LE, and last May had a femoral-popliteal bypass graft. He reports he followed up initiatlly with Dr. Lajoyce Corners who performed the amputation to remove the stitches, but has not followed up since. He beieves his last ABI on the Rt LE was 0.74 (chart review indicates 0.87 on Rt LE and 0.64 on Lt LE on 12/30/17). He Reports he has ABIs every ~3 months. Patient denies any education on compression garments to manage Rt LE edema which he  reports has been present for > 1 year as well (chart review reveals Dr. Randie Heinz recommended light compression as Rt LE ABI is 0.87) He has not been performing any self-care to manage wounds and patient reports he is bathing infrequently as it is challenging. He denies pain and states he has had difficulty feeling his LE for ~ 3 years.    Patient and Family Stated Goals  wounds to heal    Date of Onset  --   >1 year ago   Prior Treatments  no self care, prior amputations    Pain Scale  0-10    Pain Score  0-No pain    Evaluation and Treatment Procedures Explained to Patient/Family  Yes    Evaluation and Treatment Procedures  agreed to    Wound Properties Date First Assessed: 05/24/18 Time First Assessed: 1432 Wound Type: Other (Comment) Location: Ankle Location Orientation: Right;Medial Wound Description (Comments): Diffuse wound at Rt medial ankle, circular and patchy Present on Admission: Yes   Dressing Type  Impregnated gauze (bismuth);Gauze (Comment);Compression wrap    Dressing Changed  New    Dressing Status  Dry;Clean;Intact    Dressing Change Frequency  PRN    Site / Wound Assessment  Pink;Red;Yellow    % Wound base Red or Granulating  70%    % Wound base Yellow/Fibrinous Exudate  30%    Peri-wound Assessment  Edema;Erythema (non-blanchable);Intact;Hemosiderin    Wound Length (cm)  6.6 cm    Wound Width (cm)  7.7 cm    Wound Depth (cm)  0.1 cm    Wound Volume (cm^3)  5.08 cm^3    Wound  Surface Area (cm^2)  50.82 cm^2    Drainage Amount  Minimal    Drainage Description  Serous    Treatment  Cleansed;Debridement (Selective)    Wound Properties Date First Assessed: 05/24/18 Time First Assessed: 1434 Location: Foot Location Orientation: Right;Anterior;Posterior Wound Description (Comments): Diffuse wound at Metatarsal heads on dorsal and volar foot surface Present on Admission: Yes   Dressing Type  Other (Comment);Gauze (Comment);Compression wrap   medihoney   Dressing Changed  New    Dressing Status  Clean;Dry    Dressing Change Frequency  PRN    Site / Wound Assessment  Dry;Pink;Pale;Yellow   callous   % Wound base Yellow/Fibrinous Exudate  60%   yellow dry slough, callous   % Wound base Other/Granulation Tissue (Comment)  40%   pale pink tissue   Peri-wound Assessment  Intact;Edema;Erythema (non-blanchable)    Wound Length (cm)  8.6 cm    Wound Width (cm)  12 cm    Wound Depth (cm)  0.1 cm    Wound Volume (cm^3)  10.32 cm^3    Wound Surface Area (cm^2)  103.2 cm^2    Drainage Amount  Scant    Drainage Description  Serous    Treatment  Cleansed;Debridement (Selective)    Selective Debridement - Location  Rt foot wound at metatarsal heads and medial ankle wound    Selective Debridement - Tools Used  Forceps;Scalpel;Scissors    Selective Debridement - Tissue Removed  Slough, dried exudated, devitalized tissue, necrotic eschar, callous.    Wound Therapy - Clinical Statement  Mr. Dejager presents for wound care evaluation for Rt foot and ankle wounds. He has a significant history of vascular compromise with stents x2 to Rt LE and recent femoral-popliteal bypass graft and Rt trans-metatarsal amputation in May 2019. He reports Rt foot/ankle wound have been present for > 1 year. Wounds  are in inflammatory stage of healing currently and present with dried exudate and non-adherent moist slough on medial ankle wound. Foot wound wraps form dorsal surface to plantar foot surface with  significant callous build up along distal foot. Majority of callous including necrotic eschar removed with sharps debridement this session and medihoney applied to assist with removal of remaining callous. Rt medial ankle wound easily cleansed and debrided of dry and moist slough/exudate. Per ABI measures of 0.87 on 12/30/17 and recommendation per Dr. Randie Heinz at last visit to manage Rt LE edema profore lite applied to reduce swelling to improve wound healing. Patient educated to remove compression wrap if Rt LE develops significant amount of pain or increased numbness/tingling. Mr. Ayala will benefit from skilled PT interventions to promote wound healing and reduce infection risk to improve skin integrity and QOL.    Wound Therapy - Functional Problem List  Impared walking/mobility, impaired self care/ADL performance    Factors Delaying/Impairing Wound Healing  Altered sensation;Tobacco use;Vascular compromise   hx of nicotine use   Hydrotherapy Plan  Debridement;Dressing change;Patient/family education    Wound Therapy - Frequency  3X / week    Wound Therapy - Current Recommendations  PT    Wound Plan  Measure weekly. Conitnued with profore lite for edema mangement. Follow up on antibiotics with MD and patient. Conitnue with topical agents and debridement as appropriate for wound healing stage.    Dressing   Rt Medial ankle: xeroform, profore lite    Dressing  Foot wound: medihoney, gauze, prfore lite        Objective measurements completed on examination: See above findings.     PT Education - 05/24/18 1723    Education Details  Patient educated on appropriate POC for wound therapy. Educated to remove compression wrap if Rt LE develops new onset of pain or parasthesia.     Person(s) Educated  Patient    Methods  Explanation    Comprehension  Verbalized understanding       PT Short Term Goals - 05/24/18 1740      PT SHORT TERM GOAL #1   Title  Patient will have no callous present at base  of Rt foot to indicate improve WB tolerance and self care.    Time  4    Period  Weeks    Status  New    Target Date  06/21/18      PT SHORT TERM GOAL #2   Title  Wound will reduce by 50% surface area to indicate wound healing and progress towards proliferative stage of healing away from inflammatory stage.    Time  4    Period  Weeks    Status  New        PT Long Term Goals - 05/24/18 1742      PT LONG TERM GOAL #1   Title  Rt medial ankle wound will be healing with 90% reducation in surface area indicating goof approximation of wound with no evidence of imparied periwound integrity.    Time  8    Period  Weeks    Status  New    Target Date  07/19/18      PT LONG TERM GOAL #2   Title  Rt foot wound will be healing with 90% reducation in surface area indicating goof approximation of wound with no evidence of imparied periwound integrity.    Time  8    Period  Weeks    Status  New  PT LONG TERM GOAL #3   Title  Patient will have 75% reduced edema and if appropriate per vascular MD (Possible Dr. Randie Heinz) patient will be independent in donnig/doffing and carign for compresison garment to manage edema of Rt LE.    Time  8    Period  Weeks    Status  New         Plan - 05/24/18 1737    Clinical Impression Statement  Mr. Austin presents for wound care evaluation for Rt foot and ankle wounds. He has a significant history of vascular compromise with stents x2 to Rt LE and recent femoral-popliteal bypass graft and Rt trans-metatarsal amputation in May 2019. He reports Rt foot/ankle wound have been present for > 1 year. Wounds are in inflammatory stage of healing currently and present with dried exudate and non-adherent moist slough on medial ankle wound. Foot wound wraps form dorsal surface to plantar foot surface with significant callous build up along distal foot. Majority of callous including necrotic eschar removed with sharps debridement this session and medihoney applied to  assist with removal of remaining callous. Rt medial ankle wound easily cleansed and debrided of dry and moist slough/exudate. Per ABI measures of 0.87 on 12/30/17 and recommendation per Dr. Randie Heinz at last visit to manage Rt LE edema profore lite applied to reduce swelling to improve wound healing. Patient educated to remove compression wrap if Rt LE develops significant amount of pain or increased numbness/tingling. Mr. Toral will benefit from skilled PT interventions to promote wound healing and reduce infection risk to improve skin integrity and QOL.    History and Personal Factors relevant to plan of care:  peripheral arterial disease (2x stend Rt LE, Rt LE fem-popliteal bypass graft, 1x stent Lt LE)    Clinical Presentation  Stable    Clinical Presentation due to:  impaired skin integrity, edema, infection risk    Clinical Decision Making  Moderate    Rehab Potential  Fair    Clinical Impairments Affecting Rehab Potential  (-) chronic woudn history, (-) decreaed self care motivation    PT Frequency  3x / week    PT Treatment/Interventions  ADLs/Self Care Home Management;Compression bandaging;Manual techniques;Patient/family education   skilled wound care, selective debridment, compression wrapping   PT Next Visit Plan  see above    Consulted and Agree with Plan of Care  Patient       Patient will benefit from skilled therapeutic intervention in order to improve the following deficits and impairments:  Abnormal gait, Decreased balance, Decreased skin integrity, Increased edema, Impaired sensation, Decreased mobility, Difficulty walking  Visit Diagnosis: Open wound of right ankle, initial encounter  Open wound of right foot, initial encounter    Problem List Patient Active Problem List   Diagnosis Date Noted   Status post transmetatarsal amputation of right foot (HCC)    Subacute osteomyelitis, right ankle and foot (HCC)    Infected blister of foot 08/13/2017   Pressure injury of  skin 08/12/2017   Weakness 08/11/2017   COPD (chronic obstructive pulmonary disease) (HCC) 08/11/2017   Cellulitis of right lower extremity 08/11/2017   Fall at home, initial encounter 08/11/2017   Thrombocytopenia (HCC) 08/11/2017   Weakness generalized 08/11/2017   Hypokalemia 08/11/2017   Essential hypertension 08/11/2017    Valentino Saxon, PT, DPT, [facility] Physical Therapist with [facility]  05/25/2018 9:47 PM    [facility] [address] [place], [place], [address] Phone:  [phone]   Fax:  [phone]  Name: Jorge Clements MRN: [id number] Date of Birth: 12/05/1945

## 2018-05-26 ENCOUNTER — Encounter (HOSPITAL_COMMUNITY): Payer: Self-pay

## 2018-05-26 ENCOUNTER — Ambulatory Visit (HOSPITAL_COMMUNITY): Payer: Medicare HMO

## 2018-05-26 DIAGNOSIS — S91001A Unspecified open wound, right ankle, initial encounter: Secondary | ICD-10-CM | POA: Diagnosis not present

## 2018-05-26 DIAGNOSIS — S91301A Unspecified open wound, right foot, initial encounter: Secondary | ICD-10-CM

## 2018-05-26 NOTE — Therapy (Signed)
[facility] [address] [place], [place], [address] Phone: [phone]   Fax:  [phone]  Wound Care Therapy  Patient Details  Name: Jorge Clements MRN: [id number] Date of Birth: [birth year] Referring Provider (PT): Benita Stabile, MD   Encounter Date: 05/26/2018  PT End of Session - 05/26/18 1703    Visit Number  2    Number of Visits  24    Date for PT Re-Evaluation  07/19/18   Minireassess 06/21/2018   Authorization Type  [employer] medicare PPO ( no auth required, no visit limit)    Authorization Time Period  05/24/2018 - 07/21/2018    Authorization - Visit Number  2    Authorization - Number of Visits  10    PT Start Time  1517    PT Stop Time  1635    PT Time Calculation (min)  78 min    Activity Tolerance  Patient tolerated treatment well    Behavior During Therapy  [facility] for tasks assessed/performed       Past Medical History:  Diagnosis Date  . COPD (chronic obstructive pulmonary disease) (HCC)     Past Surgical History:  Procedure Laterality Date  . ABDOMINAL AORTOGRAM N/A 08/17/2017   Procedure: ABDOMINAL AORTOGRAM;  Surgeon: Maeola Harman, MD;  Location: [facility] INVASIVE CV LAB;  Service: Cardiovascular;  Laterality: N/A;  . AMPUTATION Right 08/21/2017   Procedure: TRANSMETATARSAL AMPUTATION RIGHT FOOT;  Surgeon: Nadara Mustard, MD;  Location: [facility] OR;  Service: Orthopedics;  Laterality: Right;  . AMPUTATION TOE Right 08/18/2017   Procedure: AMPUTATION RIGHT SECOND AND THIRD TOES;  Surgeon: Maeola Harman, MD;  Location: [facility] OR;  Service: Vascular;  Laterality: Right;  . ENDARTERECTOMY FEMORAL Right 08/18/2017   Procedure: ENDARTERECTOMY RIGHT SUPERFICIAL Alfredia Ferguson;  Surgeon: Maeola Harman, MD;  Location: [facility] OR;  Service: Vascular;  Laterality: Right;  . FEMORAL-POPLITEAL BYPASS GRAFT Right 08/18/2017   Procedure: BYPASS GRAFT RIGHT COMMON FEMORAL TO ABOVE KNEE POPLITEAL ARTERY USING RIGHT REVERSED GREAT  SAPHENOUS VEIN;  Surgeon: Maeola Harman, MD;  Location: [facility] OR;  Service: Vascular;  Laterality: Right;  . LOWER EXTREMITY ANGIOGRAPHY Right 08/17/2017   Procedure: Lower Extremity Angiography;  Surgeon: Maeola Harman, MD;  Location: [facility] INVASIVE CV LAB;  Service: Cardiovascular;  Laterality: Right;  . PERIPHERAL VASCULAR BALLOON ANGIOPLASTY Right 08/17/2017   Procedure: PERIPHERAL VASCULAR BALLOON ANGIOPLASTY;  Surgeon: Maeola Harman, MD;  Location: [facility] INVASIVE CV LAB;  Service: Cardiovascular;  Laterality: Right;  SFA UNABLE TO CROSS  . VEIN HARVEST Right 08/18/2017   Procedure: VEIN HARVEST RIGHT GREAT SAPHENOUS;  Surgeon: Maeola Harman, MD;  Location: [facility] OR;  Service: Vascular;  Laterality: Right;  . WOUND DEBRIDEMENT Right 08/18/2017   Procedure: DEBRIDEMENT WOUND RIGHT FOOT;  Surgeon: Maeola Harman, MD;  Location: [facility] OR;  Service: Vascular;  Laterality: Right;    There were no vitals filed for this visit.   Subjective Assessment - 05/26/18 1645    Subjective  Pt arrived with dressings intact, no issues    Patient Stated Goals  wound to heal    Currently in Pain?  No/denies                Wound Therapy - 05/26/18 1647    Subjective  Pt arrived with dressings intact, no issues    Patient and Family Stated Goals  wounds to heal    Date of Onset  --   >1 year  ago   Prior Treatments  no self care, prior amputations    Pain Scale  0-10    Pain Score  0-No pain    Evaluation and Treatment Procedures Explained to Patient/Family  Yes    Evaluation and Treatment Procedures  agreed to    Wound Properties Date First Assessed: 05/24/18 Time First Assessed: 1432 Wound Type: Other (Comment) Location: Ankle Location Orientation: Right;Medial Wound Description (Comments): Diffuse wound at Rt medial ankle, circular and patchy Present on Admission: Yes   Dressing Type  Impregnated gauze (bismuth);Gauze (Comment);Compression wrap    vaseline, xeroform, gauze and profore lite   Dressing Changed  Changed    Dressing Status  Dry;Clean;Intact    Dressing Change Frequency  PRN    Site / Wound Assessment  Pink;Red;Yellow    % Wound base Red or Granulating  80%    % Wound base Yellow/Fibrinous Exudate  20%    Peri-wound Assessment  Edema;Erythema (non-blanchable);Intact;Hemosiderin    Wound Length (cm)  6.4 cm    Wound Width (cm)  7.5 cm    Wound Depth (cm)  0.1 cm    Wound Volume (cm^3)  4.8 cm^3    Wound Surface Area (cm^2)  48 cm^2    Drainage Amount  Minimal    Drainage Description  Serous    Treatment  Cleansed;Debridement (Selective)    Wound Properties Date First Assessed: 05/24/18 Time First Assessed: 1434 Location: Foot Location Orientation: Right;Anterior;Posterior Wound Description (Comments): Diffuse wound at Metatarsal heads on dorsal and volar foot surface Present on Admission: Yes   Dressing Type  --   medihoney, gauze and profore lite   Dressing Changed  Changed    Dressing Status  Clean;Dry    Dressing Change Frequency  PRN    Site / Wound Assessment  Dry;Pink;Pale;Yellow    % Wound base Yellow/Fibrinous Exudate  50%   yellow dry callous   % Wound base Other/Granulation Tissue (Comment)  50%   pale pink tissue   Peri-wound Assessment  Intact;Edema;Erythema (non-blanchable)    Drainage Amount  Scant    Drainage Description  Serous    Treatment  Cleansed;Debridement (Selective)    Wound Properties Date First Assessed: 05/26/18 Time First Assessed: 1600 Wound Type: Other (Comment) , opening plantar surface  Location: Foot Location Orientation: Right Present on Admission: Yes   Dressing Type  Silver hydrofiber;Compression wrap   silverhydrofiber and profore lite   Dressing Changed  New    Dressing Status  Clean;Dry;Intact    Dressing Change Frequency  PRN    Site / Wound Assessment  Clean;Dry    % Wound base Red or Granulating  0%    % Wound base Other/Granulation Tissue (Comment)  --    Callous/thick dry skin   Peri-wound Assessment  Intact    Wound Length (cm)  0.3 cm    Wound Width (cm)  0.2 cm    Wound Depth (cm)  --   unknown   Wound Surface Area (cm^2)  0.06 cm^2    Drainage Amount  Minimal    Drainage Description  Serosanguineous    Treatment  Cleansed    Selective Debridement - Location  Rt foot wound at metatarsal heads and medial ankle wound    Selective Debridement - Tools Used  Forceps;Scalpel    Selective Debridement - Tissue Removed  Slough, dried exudated, devitalized tissue, necrotic eschar, callous.    Wound Therapy - Clinical Statement  Pt arrived with dressings intact.  Pt reports he tried to  get additional antibiotics though none were refilled at pharmacy.  Majority of session focus on selective debridement for removal of callous to promote healing.  Noted drainage from plantar aspect of foot, no debridement this session, did apply silver hydrofiber over opening to address drainage.  Skin very dry, applied vaseline to foot and lotion to LE and encouraged pt to apply lotion to opposite LE for hydration.  Continued wiht profore lite for edema control    Wound Therapy - Functional Problem List  Impared walking/mobility, impaired self care/ADL performance    Factors Delaying/Impairing Wound Healing  Altered sensation;Tobacco use;Vascular compromise    Hydrotherapy Plan  Debridement;Dressing change;Patient/family education    Wound Therapy - Frequency  3X / week    Wound Therapy - Current Recommendations  PT    Wound Plan  Measure weekly. Conitnued with profore lite for edema mangement. Follow up on antibiotics with MD and patient. Conitnue with topical agents and debridement as appropriate for wound healing stage.    Dressing   Rt Medial ankle: xeroform, profore lite    Dressing  Foot wound: medihoney, gauze, prfore lite    Dressing  Plantar aspect: silverhydrofiber                PT Short Term Goals - 05/24/18 1740      PT SHORT TERM GOAL #1    Title  Patient will have no callous present at base of Rt foot to indicate improve WB tolerance and self care.    Time  4    Period  Weeks    Status  New    Target Date  06/21/18      PT SHORT TERM GOAL #2   Title  Wound will reduce by 50% surface area to indicate wound healing and progress towards proliferative stage of healing away from inflammatory stage.    Time  4    Period  Weeks    Status  New        PT Long Term Goals - 05/24/18 1742      PT LONG TERM GOAL #1   Title  Rt medial ankle wound will be healing with 90% reducation in surface area indicating goof approximation of wound with no evidence of imparied periwound integrity.    Time  8    Period  Weeks    Status  New    Target Date  07/19/18      PT LONG TERM GOAL #2   Title  Rt foot wound will be healing with 90% reducation in surface area indicating goof approximation of wound with no evidence of imparied periwound integrity.    Time  8    Period  Weeks    Status  New      PT LONG TERM GOAL #3   Title  Patient will have 75% reduced edema and if appropriate per vascular MD (Possible Dr. Randie Heinz) patient will be independent in donnig/doffing and carign for compresison garment to manage edema of Rt LE.    Time  8    Period  Weeks    Status  New              Patient will benefit from skilled therapeutic intervention in order to improve the following deficits and impairments:     Visit Diagnosis: Open wound of right ankle, initial encounter  Open wound of right foot, initial encounter     Problem List Patient Active Problem List   Diagnosis Date Noted  .  Status post transmetatarsal amputation of right foot (HCC)   . Subacute osteomyelitis, right ankle and foot (HCC)   . Infected blister of foot 08/13/2017  . Pressure injury of skin 08/12/2017  . Weakness 08/11/2017  . COPD (chronic obstructive pulmonary disease) (HCC) 08/11/2017  . Cellulitis of right lower extremity 08/11/2017  . Fall at home,  initial encounter 08/11/2017  . Thrombocytopenia (HCC) 08/11/2017  . Weakness generalized 08/11/2017  . Hypokalemia 08/11/2017  . Essential hypertension 08/11/2017   Becky Saxasey Sedalia Greeson, LPTA; CBIS [phone]  Juel BurrowCockerham, Tierre Gerard Jo 05/26/2018, 5:04 PM  [facility] [address] [place], [place], [address] Phone: [phone]   Fax:  [phone]  Name: Jorge KusterMichael Clements MRN: [id number] Date of Birth: 11/18/1945

## 2018-05-29 ENCOUNTER — Ambulatory Visit (HOSPITAL_COMMUNITY): Payer: Medicare HMO | Admitting: Physical Therapy

## 2018-05-29 ENCOUNTER — Telehealth (HOSPITAL_COMMUNITY): Payer: Self-pay | Admitting: Internal Medicine

## 2018-05-29 NOTE — Telephone Encounter (Signed)
05/29/18  pt called to cx said that he thinks he has a stomach virus he has been vomiting

## 2018-05-31 ENCOUNTER — Ambulatory Visit (HOSPITAL_COMMUNITY): Payer: Medicare HMO | Admitting: Physical Therapy

## 2018-05-31 DIAGNOSIS — S91001A Unspecified open wound, right ankle, initial encounter: Secondary | ICD-10-CM

## 2018-05-31 DIAGNOSIS — S91301A Unspecified open wound, right foot, initial encounter: Secondary | ICD-10-CM

## 2018-05-31 NOTE — Therapy (Signed)
[facility] [address] [place], [place], [address] Phone: [phone]   Fax:  [phone]  Wound Care Therapy  Patient Details  Name: Jorge Clements MRN: [id number] Date of Birth: 03/20/1946 Referring Provider (PT): Benita Stabile, MD   Encounter Date: 05/31/2018  PT End of Session - 05/31/18 1541    Visit Number  3    Number of Visits  24    Date for PT Re-Evaluation  07/19/18   Minireassess 06/21/2018   Authorization Type  [employer] medicare PPO ( no auth required, no visit limit)    Authorization Time Period  05/24/2018 - 07/21/2018    Authorization - Visit Number  3    Authorization - Number of Visits  10    PT Start Time  1355    PT Stop Time  1435    PT Time Calculation (min)  40 min    Activity Tolerance  Patient tolerated treatment well    Behavior During Therapy  [facility] for tasks assessed/performed       Past Medical History:  Diagnosis Date  . COPD (chronic obstructive pulmonary disease) (HCC)     Past Surgical History:  Procedure Laterality Date  . ABDOMINAL AORTOGRAM N/A 08/17/2017   Procedure: ABDOMINAL AORTOGRAM;  Surgeon: Maeola Harman, MD;  Location: [facility] INVASIVE CV LAB;  Service: Cardiovascular;  Laterality: N/A;  . AMPUTATION Right 08/21/2017   Procedure: TRANSMETATARSAL AMPUTATION RIGHT FOOT;  Surgeon: Nadara Mustard, MD;  Location: [facility] OR;  Service: Orthopedics;  Laterality: Right;  . AMPUTATION TOE Right 08/18/2017   Procedure: AMPUTATION RIGHT SECOND AND THIRD TOES;  Surgeon: Maeola Harman, MD;  Location: [facility] OR;  Service: Vascular;  Laterality: Right;  . ENDARTERECTOMY FEMORAL Right 08/18/2017   Procedure: ENDARTERECTOMY RIGHT SUPERFICIAL Alfredia Ferguson;  Surgeon: Maeola Harman, MD;  Location: [facility] OR;  Service: Vascular;  Laterality: Right;  . FEMORAL-POPLITEAL BYPASS GRAFT Right 08/18/2017   Procedure: BYPASS GRAFT RIGHT COMMON FEMORAL TO ABOVE KNEE POPLITEAL ARTERY USING RIGHT REVERSED GREAT  SAPHENOUS VEIN;  Surgeon: Maeola Harman, MD;  Location: [facility] OR;  Service: Vascular;  Laterality: Right;  . LOWER EXTREMITY ANGIOGRAPHY Right 08/17/2017   Procedure: Lower Extremity Angiography;  Surgeon: Maeola Harman, MD;  Location: [facility] INVASIVE CV LAB;  Service: Cardiovascular;  Laterality: Right;  . PERIPHERAL VASCULAR BALLOON ANGIOPLASTY Right 08/17/2017   Procedure: PERIPHERAL VASCULAR BALLOON ANGIOPLASTY;  Surgeon: Maeola Harman, MD;  Location: [facility] INVASIVE CV LAB;  Service: Cardiovascular;  Laterality: Right;  SFA UNABLE TO CROSS  . VEIN HARVEST Right 08/18/2017   Procedure: VEIN HARVEST RIGHT GREAT SAPHENOUS;  Surgeon: Maeola Harman, MD;  Location: [facility] OR;  Service: Vascular;  Laterality: Right;  . WOUND DEBRIDEMENT Right 08/18/2017   Procedure: DEBRIDEMENT WOUND RIGHT FOOT;  Surgeon: Maeola Harman, MD;  Location: [facility] OR;  Service: Vascular;  Laterality: Right;    There were no vitals filed for this visit.              Wound Therapy - 05/31/18 1542    Subjective  Pt arrived with dressings intact, no issues    Patient and Family Stated Goals  wounds to heal    Date of Onset  --   >1 year ago   Prior Treatments  no self care, prior amputations    Evaluation and Treatment Procedures Explained to Patient/Family  Yes    Evaluation and Treatment Procedures  agreed to    Wound Properties Date  First Assessed: 05/24/18 Time First Assessed: 1432 Wound Type: Other (Comment) Location: Ankle Location Orientation: Right;Medial Wound Description (Comments): Diffuse wound at Rt medial ankle, circular and patchy Present on Admission: Yes   Dressing Type  Impregnated gauze (bismuth);Gauze (Comment);Compression wrap   vaseline, xeroform, gauze and profore lite   Dressing Changed  Changed    Dressing Status  Dry;Clean;Intact    Dressing Change Frequency  PRN    Site / Wound Assessment  Pink;Red;Yellow    % Wound base Red or Granulating  80%     % Wound base Yellow/Fibrinous Exudate  --    % Wound base Other/Granulation Tissue (Comment)  20%   dry, patchy   Peri-wound Assessment  Edema;Erythema (non-blanchable);Intact;Hemosiderin    Wound Length (cm)  5.5 cm    Wound Width (cm)  7 cm    Wound Depth (cm)  0 cm    Wound Volume (cm^3)  0 cm^3    Wound Surface Area (cm^2)  38.5 cm^2    Drainage Amount  None    Drainage Description  --    Treatment  Cleansed;Debridement (Selective)    Wound Properties Date First Assessed: 05/24/18 Time First Assessed: 1434 Location: Foot Location Orientation: Right;Anterior;Posterior Wound Description (Comments): Diffuse wound at Metatarsal heads on dorsal and volar foot surface Present on Admission: Yes   Dressing Type  Impregnated gauze (bismuth)   medihoney, gauze and profore lite   Dressing Changed  Changed    Dressing Status  Clean;Dry    Dressing Change Frequency  PRN    Site / Wound Assessment  Dry;Pink;Pale;Yellow    % Wound base Yellow/Fibrinous Exudate  80%   yellow dry callous   % Wound base Other/Granulation Tissue (Comment)  20%   pale pink tissue   Peri-wound Assessment  Intact;Edema;Erythema (non-blanchable)    Wound Length (cm)  8.5 cm    Wound Width (cm)  10 cm    Wound Depth (cm)  0 cm    Wound Volume (cm^3)  0 cm^3    Wound Surface Area (cm^2)  85 cm^2    Drainage Amount  None    Drainage Description  --    Treatment  Cleansed;Debridement (Selective)    Wound Properties Date First Assessed: 05/26/18 Time First Assessed: 1600 Wound Type: Other (Comment) , opening plantar surface  Location: Foot Location Orientation: Right Present on Admission: Yes   Dressing Type  Silver hydrofiber;Compression wrap   silverhydrofiber and profore lite   Dressing Changed  Changed    Dressing Status  Clean;Dry;Intact    Dressing Change Frequency  PRN    Site / Wound Assessment  Clean;Dry    % Wound base Red or Granulating  0%    % Wound base Other/Granulation Tissue (Comment)  --    Callous/thick dry skin   Peri-wound Assessment  Maceration    Wound Length (cm)  1.4 cm    Wound Width (cm)  1.4 cm    Wound Depth (cm)  0.6 cm   at least, could be more; all the head of the Qtip would go   Wound Volume (cm^3)  1.18 cm^3    Wound Surface Area (cm^2)  1.96 cm^2    Drainage Amount  Minimal    Drainage Description  Serosanguineous;Purulent    Treatment  Cleansed;Debridement (Selective)    Selective Debridement - Location  plantar/dorsal foot and medial ankle    Selective Debridement - Tools Used  Forceps;Scalpel    Selective Debridement - Tissue Removed  Slough, dried  exudated, devitalized tissue, necrotic eschar, callous.    Wound Therapy - Clinical Statement  Dressings remain intact from last session, 5 days ago.  Noted drainage coming through at plantar aspect.  Upon removal, noted redness distal LE and heat. Questioned regarding antibiotics (PT is going to call MD regarding this).  Dorsal foot and medial ankle still with difuse raw areas and scabbed/kerototic areas requiring debridement, however no open wounds or active draiange.  Plantar surface with noted draingage and maceration present.  Measurements reveal ovearll increase in size.  Able to remove large amount of devitalized, macerated tissue from periemeter of wound which is at least 0.6cm in depth.  Able to express serosanginous exudate with  pressure but without cloudy or noted infectious drainage.  Packed wound with silver hydrofiber.  Continued with cleansing and heavy moisturizing of LE, xeroform to medial ankle and dorsal foot.      Wound Therapy - Functional Problem List  Impared walking/mobility, impaired self care/ADL performance    Factors Delaying/Impairing Wound Healing  Altered sensation;Tobacco use;Vascular compromise    Hydrotherapy Plan  Debridement;Dressing change;Patient/family education    Wound Therapy - Frequency  3X / week    Wound Therapy - Current Recommendations  PT    Wound Plan  Measure weekly.  Conitnued with profore lite for edema mangement. Follow up on antibiotics with MD and patient. Conitnue with topical agents and debridement as appropriate for wound healing stage.    Dressing   Rt Medial ankle: xeroform, profore lite    Dressing  Foot wound: medihoney, gauze, prfore lite                PT Short Term Goals - 05/24/18 1740      PT SHORT TERM GOAL #1   Title  Patient will have no callous present at base of Rt foot to indicate improve WB tolerance and self care.    Time  4    Period  Weeks    Status  New    Target Date  06/21/18      PT SHORT TERM GOAL #2   Title  Wound will reduce by 50% surface area to indicate wound healing and progress towards proliferative stage of healing away from inflammatory stage.    Time  4    Period  Weeks    Status  New        PT Long Term Goals - 05/24/18 1742      PT LONG TERM GOAL #1   Title  Rt medial ankle wound will be healing with 90% reducation in surface area indicating goof approximation of wound with no evidence of imparied periwound integrity.    Time  8    Period  Weeks    Status  New    Target Date  07/19/18      PT LONG TERM GOAL #2   Title  Rt foot wound will be healing with 90% reducation in surface area indicating goof approximation of wound with no evidence of imparied periwound integrity.    Time  8    Period  Weeks    Status  New      PT LONG TERM GOAL #3   Title  Patient will have 75% reduced edema and if appropriate per vascular MD (Possible Dr. Randie Heinz) patient will be independent in donnig/doffing and carign for compresison garment to manage edema of Rt LE.    Time  8    Period  Weeks    Status  New              Patient will benefit from skilled therapeutic intervention in order to improve the following deficits and impairments:     Visit Diagnosis: Open wound of right ankle, initial encounter  Open wound of right foot, initial encounter     Problem List Patient Active Problem  List   Diagnosis Date Noted  . Status post transmetatarsal amputation of right foot (HCC)   . Subacute osteomyelitis, right ankle and foot (HCC)   . Infected blister of foot 08/13/2017  . Pressure injury of skin 08/12/2017  . Weakness 08/11/2017  . COPD (chronic obstructive pulmonary disease) (HCC) 08/11/2017  . Cellulitis of right lower extremity 08/11/2017  . Fall at home, initial encounter 08/11/2017  . Thrombocytopenia (HCC) 08/11/2017  . Weakness generalized 08/11/2017  . Hypokalemia 08/11/2017  . Essential hypertension 08/11/2017   Lurena Nida, PTA/CLT [phone]  Lurena Nida 05/31/2018, 4:03 PM  [facility] [address] [place], [place], [address] Phone: [phone]   Fax:  [phone]  Name: Jomari Rudis MRN: [id number] Date of Birth: 06-14-1945

## 2018-06-02 ENCOUNTER — Encounter (HOSPITAL_COMMUNITY): Payer: Self-pay

## 2018-06-02 ENCOUNTER — Ambulatory Visit (HOSPITAL_COMMUNITY): Payer: Medicare HMO

## 2018-06-02 DIAGNOSIS — S91001A Unspecified open wound, right ankle, initial encounter: Secondary | ICD-10-CM

## 2018-06-02 DIAGNOSIS — S91301A Unspecified open wound, right foot, initial encounter: Secondary | ICD-10-CM | POA: Diagnosis not present

## 2018-06-02 NOTE — Therapy (Signed)
[facility] [address] [place], [place], [address] Phone: [phone]   Fax:  [phone]  Wound Care Therapy  Patient Details  Name: Jorge Clements MRN: [id number] Date of Birth: Sep 11, 1945 Referring Provider (PT): Benita Stabile, MD   Encounter Date: 06/02/2018  PT End of Session - 06/02/18 1833    Visit Number  4    Number of Visits  24    Date for PT Re-Evaluation  07/19/18   Minireassess 06/21/18   Authorization Type  [employer] medicare PPO ( no auth required, no visit limit)    Authorization Time Period  05/24/2018 - 07/21/2018    Authorization - Visit Number  4    Authorization - Number of Visits  10    PT Start Time  1520    PT Stop Time  1635    PT Time Calculation (min)  75 min    Activity Tolerance  Patient tolerated treatment well    Behavior During Therapy  [facility] for tasks assessed/performed       Past Medical History:  Diagnosis Date  . COPD (chronic obstructive pulmonary disease) (HCC)     Past Surgical History:  Procedure Laterality Date  . ABDOMINAL AORTOGRAM N/A 08/17/2017   Procedure: ABDOMINAL AORTOGRAM;  Surgeon: Maeola Harman, MD;  Location: [facility] INVASIVE CV LAB;  Service: Cardiovascular;  Laterality: N/A;  . AMPUTATION Right 08/21/2017   Procedure: TRANSMETATARSAL AMPUTATION RIGHT FOOT;  Surgeon: Nadara Mustard, MD;  Location: [facility] OR;  Service: Orthopedics;  Laterality: Right;  . AMPUTATION TOE Right 08/18/2017   Procedure: AMPUTATION RIGHT SECOND AND THIRD TOES;  Surgeon: Maeola Harman, MD;  Location: [facility] OR;  Service: Vascular;  Laterality: Right;  . ENDARTERECTOMY FEMORAL Right 08/18/2017   Procedure: ENDARTERECTOMY RIGHT SUPERFICIAL Alfredia Ferguson;  Surgeon: Maeola Harman, MD;  Location: [facility] OR;  Service: Vascular;  Laterality: Right;  . FEMORAL-POPLITEAL BYPASS GRAFT Right 08/18/2017   Procedure: BYPASS GRAFT RIGHT COMMON FEMORAL TO ABOVE KNEE POPLITEAL ARTERY USING RIGHT REVERSED GREAT  SAPHENOUS VEIN;  Surgeon: Maeola Harman, MD;  Location: [facility] OR;  Service: Vascular;  Laterality: Right;  . LOWER EXTREMITY ANGIOGRAPHY Right 08/17/2017   Procedure: Lower Extremity Angiography;  Surgeon: Maeola Harman, MD;  Location: [facility] INVASIVE CV LAB;  Service: Cardiovascular;  Laterality: Right;  . PERIPHERAL VASCULAR BALLOON ANGIOPLASTY Right 08/17/2017   Procedure: PERIPHERAL VASCULAR BALLOON ANGIOPLASTY;  Surgeon: Maeola Harman, MD;  Location[facility] INVASIVE CV LAB;  Service: Cardiovascular;  Laterality: Right;  SFA UNABLE TO CROSS  . VEIN HARVEST Right 08/18/2017   Procedure: VEIN HARVEST RIGHT GREAT SAPHENOUS;  Surgeon: Maeola Harman, MD;  Location: [facility] OR;  Service: Vascular;  Laterality: Right;  . WOUND DEBRIDEMENT Right 08/18/2017   Procedure: DEBRIDEMENT WOUND RIGHT FOOT;  Surgeon: Maeola Harman, MD;  Location: [facility] OR;  Service: Vascular;  Laterality: Right;    There were no vitals filed for this visit.   Subjective Assessment - 06/02/18 1821    Subjective  Pt stated he began antibiotics todays, 2x/day for 10 days.  Dressings intact.    Patient Stated Goals  wound to heal    Currently in Pain?  No/denies                Wound Therapy - 06/02/18 1821    Subjective  Pt stated he began antibiotics todays, 2x/day for 10 days.  Dressings intact.    Patient and Family Stated Goals  wounds to heal  Date of Onset  --   >1 year ago   Prior Treatments  no self care, prior amputations    Pain Scale  0-10    Pain Score  0-No pain    Evaluation and Treatment Procedures Explained to Patient/Family  Yes    Evaluation and Treatment Procedures  agreed to    Wound Properties Date First Assessed: 05/26/18 Time First Assessed: 1600 Wound Type: Other (Comment) , opening plantar surface  Location: Foot Location Orientation: Right Present on Admission: Yes   Dressing Type  Silver hydrofiber;Compression wrap   silverhydrofiber; profore lite    Dressing Changed  Changed    Dressing Status  Clean;Dry;Intact    Dressing Change Frequency  PRN    Site / Wound Assessment  Clean;Dry    % Wound base Red or Granulating  0%    % Wound base Other/Granulation Tissue (Comment)  --   callous/thick dry skin   Peri-wound Assessment  Intact    Wound Length (cm)  1.4 cm   was 1.4   Wound Width (cm)  1.4 cm   was 1.4   Wound Depth (cm)  0.6 cm   was .6   Wound Volume (cm^3)  1.18 cm^3    Wound Surface Area (cm^2)  1.96 cm^2    Drainage Amount  Minimal    Drainage Description  Serosanguineous;Purulent    Treatment  Cleansed;Debridement (Selective);Hydrotherapy (Pulse lavage)    Wound Properties Date First Assessed: 05/24/18 Time First Assessed: 1432 Wound Type: Other (Comment) Location: Ankle Location Orientation: Right;Medial Wound Description (Comments): Diffuse wound at Rt medial ankle, circular and patchy Present on Admission: Yes   Dressing Type  Impregnated gauze (bismuth);Gauze (Comment);Compression wrap   xeroform, vaseline, profore lite   Dressing Changed  Changed    Dressing Status  Dry;Clean;Intact    Dressing Change Frequency  PRN    Site / Wound Assessment  Pink;Red;Yellow    % Wound base Red or Granulating  80%    % Wound base Other/Granulation Tissue (Comment)  20%   dry, patchy   Drainage Amount  None    Treatment  Cleansed;Debridement (Selective)    Wound Properties Date First Assessed: 05/24/18 Time First Assessed: 1434 Location: Foot Location Orientation: Right;Anterior;Posterior Wound Description (Comments): Diffuse wound at Metatarsal heads on dorsal and volar foot surface Present on Admission: Yes   Dressing Type  Impregnated gauze (bismuth)   medihoney, xeroform, vaseline, profore lite   Dressing Changed  Changed    Dressing Status  Clean;Dry    Dressing Change Frequency  PRN    Site / Wound Assessment  Dry;Pink;Pale;Yellow    % Wound base Yellow/Fibrinous Exudate  80%   yellow dry callous   % Wound base  Other/Granulation Tissue (Comment)  20%   pale/pink tissue   Peri-wound Assessment  Intact;Edema;Erythema (non-blanchable)    Drainage Amount  None    Drainage Description  Serous    Treatment  Cleansed;Debridement (Selective)    Pulsed lavage therapy - wound location  plantar aspect    Pulsed Lavage with Suction (psi)  4 psi    Pulsed Lavage with Suction - Normal Saline Used  1000 mL    Pulsed Lavage Tip  Tip with splash shield    Selective Debridement - Location  plantar/dorsal foot and medial ankle    Selective Debridement - Tools Used  Forceps;Scalpel    Selective Debridement - Tissue Removed  Slough, dried exudated, devitalized tissue, necrotic eschar, callous.    Wound Therapy - Clinical  Statement  Noted drainage through dressings from wound on plantar aspect of foot.  Added PLS to plantar aspect for cleansing.  Selective debridement for removal of callous surrounding wound and dried exudate to promote healing.  Packed wound with silverhydrofiber to address drainage.  Medihoney placed on distal aspect of foot to assist with adherent exudate and xeroform on ankle.  Noted skin very dry, reviewed benefits of keeping feet moisturized to prevent cracks in skin.  Continued with profore lite dressings for edema control.    Wound Therapy - Functional Problem List  Impared walking/mobility, impaired self care/ADL performance    Factors Delaying/Impairing Wound Healing  Altered sensation;Tobacco use;Vascular compromise    Hydrotherapy Plan  Debridement;Dressing change;Patient/family education    Wound Therapy - Frequency  3X / week    Wound Therapy - Current Recommendations  PT    Wound Plan  Measure weekly. Conitnued with profore lite for edema mangement. Conitnue with topical agents and debridement as appropriate for wound healing stage.    Dressing   Rt Medial ankle: xeroform, profore lite    Dressing  Foot wound: medihoney, gauze, prfore lite    Dressing  Plantar aspect: silverhydrofiber with  profore lite                PT Short Term Goals - 05/24/18 1740      PT SHORT TERM GOAL #1   Title  Patient will have no callous present at base of Rt foot to indicate improve WB tolerance and self care.    Time  4    Period  Weeks    Status  New    Target Date  06/21/18      PT SHORT TERM GOAL #2   Title  Wound will reduce by 50% surface area to indicate wound healing and progress towards proliferative stage of healing away from inflammatory stage.    Time  4    Period  Weeks    Status  New        PT Long Term Goals - 05/24/18 1742      PT LONG TERM GOAL #1   Title  Rt medial ankle wound will be healing with 90% reducation in surface area indicating goof approximation of wound with no evidence of imparied periwound integrity.    Time  8    Period  Weeks    Status  New    Target Date  07/19/18      PT LONG TERM GOAL #2   Title  Rt foot wound will be healing with 90% reducation in surface area indicating goof approximation of wound with no evidence of imparied periwound integrity.    Time  8    Period  Weeks    Status  New      PT LONG TERM GOAL #3   Title  Patient will have 75% reduced edema and if appropriate per vascular MD (Possible Dr. Randie Heinzain) patient will be independent in donnig/doffing and carign for compresison garment to manage edema of Rt LE.    Time  8    Period  Weeks    Status  New              Patient will benefit from skilled therapeutic intervention in order to improve the following deficits and impairments:     Visit Diagnosis: Open wound of right ankle, initial encounter  Open wound of right foot, initial encounter     Problem List Patient Active Problem List  Diagnosis Date Noted  . Status post transmetatarsal amputation of right foot (HCC)   . Subacute osteomyelitis, right ankle and foot (HCC)   . Infected blister of foot 08/13/2017  . Pressure injury of skin 08/12/2017  . Weakness 08/11/2017  . COPD (chronic  obstructive pulmonary disease) (HCC) 08/11/2017  . Cellulitis of right lower extremity 08/11/2017  . Fall at home, initial encounter 08/11/2017  . Thrombocytopenia (HCC) 08/11/2017  . Weakness generalized 08/11/2017  . Hypokalemia 08/11/2017  . Essential hypertension 08/11/2017   Becky Sax, LPTA; CBIS [phone]  Juel Burrow 06/02/2018, 6:34 PM  [facility] [address] [place], [place], [address] Phone: [phone]   Fax:  [phone]  Name: Jorge Clements MRN: [id number] Date of Birth: May 14, 1945

## 2018-06-04 NOTE — Addendum Note (Signed)
Addended by: Anitra Lauth on: 06/04/2018 08:48 PM   Modules accepted: Orders

## 2018-06-07 ENCOUNTER — Ambulatory Visit (HOSPITAL_COMMUNITY): Payer: Medicare HMO | Admitting: Physical Therapy

## 2018-06-07 ENCOUNTER — Telehealth (HOSPITAL_COMMUNITY): Payer: Self-pay | Admitting: Physical Therapy

## 2018-06-07 DIAGNOSIS — S91301D Unspecified open wound, right foot, subsequent encounter: Secondary | ICD-10-CM | POA: Diagnosis not present

## 2018-06-07 DIAGNOSIS — Z87891 Personal history of nicotine dependence: Secondary | ICD-10-CM | POA: Diagnosis not present

## 2018-06-07 DIAGNOSIS — Z4801 Encounter for change or removal of surgical wound dressing: Secondary | ICD-10-CM | POA: Diagnosis not present

## 2018-06-07 DIAGNOSIS — S91301A Unspecified open wound, right foot, initial encounter: Secondary | ICD-10-CM | POA: Diagnosis not present

## 2018-06-07 DIAGNOSIS — S91001A Unspecified open wound, right ankle, initial encounter: Secondary | ICD-10-CM | POA: Diagnosis not present

## 2018-06-07 DIAGNOSIS — Y848 Other medical procedures as the cause of abnormal reaction of the patient, or of later complication, without mention of misadventure at the time of the procedure: Secondary | ICD-10-CM | POA: Diagnosis not present

## 2018-06-07 DIAGNOSIS — X58XXXA Exposure to other specified factors, initial encounter: Secondary | ICD-10-CM | POA: Diagnosis not present

## 2018-06-07 DIAGNOSIS — Z95820 Peripheral vascular angioplasty status with implants and grafts: Secondary | ICD-10-CM | POA: Diagnosis not present

## 2018-06-07 NOTE — Therapy (Signed)
[facility] [address] [place], [place], [address] Phone: [phone]   Fax:  [phone]  Wound Care Therapy  Patient Details  Name: Jorge Clements MRN: [id number] Date of Birth: June 01, 1945 Referring Provider (PT): Benita Stabile, MD   Encounter Date: 06/07/2018  PT End of Session - 06/07/18 1517    Visit Number  5    Number of Visits  24    Date for PT Re-Evaluation  07/19/18   Minireassess 06/21/18   Authorization Type  [employer] medicare PPO ( no auth required, no visit limit)    Authorization Time Period  05/24/2018 - 07/21/2018    Authorization - Visit Number  5    Authorization - Number of Visits  10    PT Start Time  1438    PT Stop Time  1500    PT Time Calculation (min)  22 min    Activity Tolerance  Patient tolerated treatment well    Behavior During Therapy  [facility] for tasks assessed/performed       Past Medical History:  Diagnosis Date  . COPD (chronic obstructive pulmonary disease) (HCC)     Past Surgical History:  Procedure Laterality Date  . ABDOMINAL AORTOGRAM N/A 08/17/2017   Procedure: ABDOMINAL AORTOGRAM;  Surgeon: Maeola Harman, MD;  Location: [facility] INVASIVE CV LAB;  Service: Cardiovascular;  Laterality: N/A;  . AMPUTATION Right 08/21/2017   Procedure: TRANSMETATARSAL AMPUTATION RIGHT FOOT;  Surgeon: Nadara Mustard, MD;  Location: [facility] OR;  Service: Orthopedics;  Laterality: Right;  . AMPUTATION TOE Right 08/18/2017   Procedure: AMPUTATION RIGHT SECOND AND THIRD TOES;  Surgeon: Maeola Harman, MD;  Location: [facility] OR;  Service: Vascular;  Laterality: Right;  . ENDARTERECTOMY FEMORAL Right 08/18/2017   Procedure: ENDARTERECTOMY RIGHT SUPERFICIAL Alfredia Ferguson;  Surgeon: Maeola Harman, MD;  Location: [facility] OR;  Service: Vascular;  Laterality: Right;  . FEMORAL-POPLITEAL BYPASS GRAFT Right 08/18/2017   Procedure: BYPASS GRAFT RIGHT COMMON FEMORAL TO ABOVE KNEE POPLITEAL ARTERY USING RIGHT REVERSED GREAT  SAPHENOUS VEIN;  Surgeon: Maeola Harman, MD;  Location: [facility] OR;  Service: Vascular;  Laterality: Right;  . LOWER EXTREMITY ANGIOGRAPHY Right 08/17/2017   Procedure: Lower Extremity Angiography;  Surgeon: Maeola Harman, MD;  Location: [facility] INVASIVE CV LAB;  Service: Cardiovascular;  Laterality: Right;  . PERIPHERAL VASCULAR BALLOON ANGIOPLASTY Right 08/17/2017   Procedure: PERIPHERAL VASCULAR BALLOON ANGIOPLASTY;  Surgeon: Maeola Harman, MD;  Location: [facility] INVASIVE CV LAB;  Service: Cardiovascular;  Laterality: Right;  SFA UNABLE TO CROSS  . VEIN HARVEST Right 08/18/2017   Procedure: VEIN HARVEST RIGHT GREAT SAPHENOUS;  Surgeon: Maeola Harman, MD;  Location: [facility] OR;  Service: Vascular;  Laterality: Right;  . WOUND DEBRIDEMENT Right 08/18/2017   Procedure: DEBRIDEMENT WOUND RIGHT FOOT;  Surgeon: Maeola Harman, MD;  Location: [facility] OR;  Service: Vascular;  Laterality: Right;    There were no vitals filed for this visit.   Subjective Assessment - 06/07/18 1510    Subjective  pt reports he has not felt good since yesterday and is having more pain and sensitivity when he puts weight on it.      Currently in Pain?  Yes    Pain Score  3          Treatment:  Wound and surrounding area cleansed with dresssing of silver hydrofiber, Gauze, ABD and 2 rolls of kerlix to absorb copious drainage. No debridement completed today.      Assessment:  Purulent Serosanginous drainage "poured" out of wound with bandage removal and continued to ooze from wound.  Pt with redness, heat and systemic symptoms including nausea and reported fever.  Pt instructed to go straight to ED to get foot inspected for possible infection. Therapist called charge nurse to notify that pateint was being sent over Verlon Au) and referring MD office also notified.   Plan:  Await further instructions; follow up on ED report.            PT Short Term Goals - 05/24/18 1740       PT SHORT TERM GOAL #1   Title  Patient will have no callous present at base of Rt foot to indicate improve WB tolerance and self care.    Time  4    Period  Weeks    Status  New    Target Date  06/21/18      PT SHORT TERM GOAL #2   Title  Wound will reduce by 50% surface area to indicate wound healing and progress towards proliferative stage of healing away from inflammatory stage.    Time  4    Period  Weeks    Status  New        PT Long Term Goals - 05/24/18 1742      PT LONG TERM GOAL #1   Title  Rt medial ankle wound will be healing with 90% reducation in surface area indicating goof approximation of wound with no evidence of imparied periwound integrity.    Time  8    Period  Weeks    Status  New    Target Date  07/19/18      PT LONG TERM GOAL #2   Title  Rt foot wound will be healing with 90% reducation in surface area indicating goof approximation of wound with no evidence of imparied periwound integrity.    Time  8    Period  Weeks    Status  New      PT LONG TERM GOAL #3   Title  Patient will have 75% reduced edema and if appropriate per vascular MD (Possible Dr. Randie Heinz) patient will be independent in donnig/doffing and carign for compresison garment to manage edema of Rt LE.    Time  8    Period  Weeks    Status  New              Patient will benefit from skilled therapeutic intervention in order to improve the following deficits and impairments:     Visit Diagnosis: Open wound of right ankle, initial encounter  Open wound of right foot, initial encounter     Problem List Patient Active Problem List   Diagnosis Date Noted  . Status post transmetatarsal amputation of right foot (HCC)   . Subacute osteomyelitis, right ankle and foot (HCC)   . Infected blister of foot 08/13/2017  . Pressure injury of skin 08/12/2017  . Weakness 08/11/2017  . COPD (chronic obstructive pulmonary disease) (HCC) 08/11/2017  . Cellulitis of right lower extremity  08/11/2017  . Fall at home, initial encounter 08/11/2017  . Thrombocytopenia (HCC) 08/11/2017  . Weakness generalized 08/11/2017  . Hypokalemia 08/11/2017  . Essential hypertension 08/11/2017   Lurena Nida, PTA/CLT [phone]  Lurena Nida 06/07/2018, 3:17 PM  [facility] [address] [place], [place], [address] Phone: [phone]   Fax:  [phone]  Name: Jorge Clements MRN: [id number] Date of Birth: 1945-11-13

## 2018-06-07 NOTE — Telephone Encounter (Signed)
Pt instructed to go to ED regarding possible infection in his foot.  ED at [facility] called and informed that patient was being sent Verlon Au).  Also called Dr. Scharlene Gloss office and left message regarding therapist sending one of his patients to ED and to return our call if needed more information Lurena Nida, PTA/CLT [phone]\

## 2018-06-12 ENCOUNTER — Encounter

## 2018-06-14 ENCOUNTER — Ambulatory Visit (HOSPITAL_COMMUNITY): Payer: Medicare HMO

## 2018-06-14 ENCOUNTER — Encounter (HOSPITAL_COMMUNITY): Payer: Self-pay

## 2018-06-14 DIAGNOSIS — S91301A Unspecified open wound, right foot, initial encounter: Secondary | ICD-10-CM

## 2018-06-14 DIAGNOSIS — S91001A Unspecified open wound, right ankle, initial encounter: Secondary | ICD-10-CM

## 2018-06-14 NOTE — Therapy (Signed)
[facility] [address] [place], [place], [address] Phone: [phone]   Fax:  [phone]  Wound Care Therapy  Patient Details  Name: Jorge Clements MRN: [id number] Date of Birth: May 14, 1945 Referring Provider (PT): Benita Stabile, MD   Encounter Date: 06/14/2018  PT End of Session - 06/14/18 1739    Visit Number  6    Number of Visits  24    Date for PT Re-Evaluation  07/19/18   Minireassess 06/21/18   Authorization Type  [employer] medicare PPO ( no auth required, no visit limit)    Authorization Time Period  05/24/2018 - 07/21/2018    Authorization - Visit Number  6    Authorization - Number of Visits  10    PT Start Time  1430    PT Stop Time  1610    PT Time Calculation (min)  100 min    Activity Tolerance  Patient tolerated treatment well    Behavior During Therapy  [facility] for tasks assessed/performed       Past Medical History:  Diagnosis Date  . COPD (chronic obstructive pulmonary disease) (HCC)     Past Surgical History:  Procedure Laterality Date  . ABDOMINAL AORTOGRAM N/A 08/17/2017   Procedure: ABDOMINAL AORTOGRAM;  Surgeon: Maeola Harman, MD;  Location: [facility] INVASIVE CV LAB;  Service: Cardiovascular;  Laterality: N/A;  . AMPUTATION Right 08/21/2017   Procedure: TRANSMETATARSAL AMPUTATION RIGHT FOOT;  Surgeon: Nadara Mustard, MD;  Location: [facility] OR;  Service: Orthopedics;  Laterality: Right;  . AMPUTATION TOE Right 08/18/2017   Procedure: AMPUTATION RIGHT SECOND AND THIRD TOES;  Surgeon: Maeola Harman, MD;  Location: [facility] OR;  Service: Vascular;  Laterality: Right;  . ENDARTERECTOMY FEMORAL Right 08/18/2017   Procedure: ENDARTERECTOMY RIGHT SUPERFICIAL Alfredia Ferguson;  Surgeon: Maeola Harman, MD;  Location: [facility] OR;  Service: Vascular;  Laterality: Right;  . FEMORAL-POPLITEAL BYPASS GRAFT Right 08/18/2017   Procedure: BYPASS GRAFT RIGHT COMMON FEMORAL TO ABOVE KNEE POPLITEAL ARTERY USING RIGHT REVERSED GREAT  SAPHENOUS VEIN;  Surgeon: Maeola Harman, MD;  Location: [facility] OR;  Service: Vascular;  Laterality: Right;  . LOWER EXTREMITY ANGIOGRAPHY Right 08/17/2017   Procedure: Lower Extremity Angiography;  Surgeon: Maeola Harman, MD;  Location: [facility] INVASIVE CV LAB;  Service: Cardiovascular;  Laterality: Right;  . PERIPHERAL VASCULAR BALLOON ANGIOPLASTY Right 08/17/2017   Procedure: PERIPHERAL VASCULAR BALLOON ANGIOPLASTY;  Surgeon: Maeola Harman, MD;  Location: [facility] INVASIVE CV LAB;  Service: Cardiovascular;  Laterality: Right;  SFA UNABLE TO CROSS  . VEIN HARVEST Right 08/18/2017   Procedure: VEIN HARVEST RIGHT GREAT SAPHENOUS;  Surgeon: Maeola Harman, MD;  Location: [facility] OR;  Service: Vascular;  Laterality: Right;  . WOUND DEBRIDEMENT Right 08/18/2017   Procedure: DEBRIDEMENT WOUND RIGHT FOOT;  Surgeon: Maeola Harman, MD;  Location: [facility] OR;  Service: Vascular;  Laterality: Right;    There were no vitals filed for this visit.   Subjective Assessment - 06/14/18 1725    Subjective  Pt arrived with dressings intact.  Reports he went to ED in [place], they looked at wound and re-wrapped.  Reports he is done wiht antibiotics now    Patient Stated Goals  wound to heal    Currently in Pain?  No/denies                Wound Therapy - 06/14/18 1726    Subjective  Pt arrived with dressings intact.  Reports he went to ED  in [place], they looked at wound and re-wrapped.  Reports he is done wiht antibiotics now    Patient and Family Stated Goals  wounds to heal    Date of Onset  --   >1 year ago   Prior Treatments  no self care, prior amputations    Pain Scale  0-10    Pain Score  0-No pain    Evaluation and Treatment Procedures Explained to Patient/Family  Yes    Evaluation and Treatment Procedures  agreed to    Wound Properties Date First Assessed: 05/26/18 Time First Assessed: 1600 Wound Type: Other (Comment) , opening plantar surface  Location: Foot Location  Orientation: Right Present on Admission: Yes   Dressing Type  Silver hydrofiber;Compression wrap   Silver hydrofiber with profore lite   Dressing Changed  Changed    Dressing Status  Clean;Dry;Intact    Dressing Change Frequency  PRN    Site / Wound Assessment  Clean;Dry    % Wound base Red or Granulating  40%    % Wound base Yellow/Fibrinous Exudate  60%    % Wound base Other/Granulation Tissue (Comment)  --   callous/dry skin perimeter   Peri-wound Assessment  Intact    Wound Length (cm)  1.8 cm    Wound Width (cm)  1.6 cm    Wound Depth (cm)  0.8 cm    Wound Volume (cm^3)  2.3 cm^3    Wound Surface Area (cm^2)  2.88 cm^2    Drainage Amount  Minimal    Drainage Description  Serosanguineous;Purulent    Treatment  Cleansed;Debridement (Selective);Hydrotherapy (Pulse lavage)    Wound Properties Date First Assessed: 05/24/18 Time First Assessed: 1432 Wound Type: Other (Comment) Location: Ankle Location Orientation: Right;Medial Wound Description (Comments): Diffuse wound at Rt medial ankle, circular and patchy Present on Admission: Yes   Dressing Type  Impregnated gauze (bismuth);Compression wrap   xeroform wiht profore lite   Dressing Changed  Changed    Dressing Status  Dry;Clean;Intact    Dressing Change Frequency  PRN    Site / Wound Assessment  Pink;Red;Yellow    % Wound base Red or Granulating  80%    % Wound base Other/Granulation Tissue (Comment)  20%   dry, patchy   Peri-wound Assessment  Edema;Erythema (non-blanchable);Intact;Hemosiderin    Drainage Amount  None    Treatment  Cleansed;Debridement (Selective)    Wound Properties Date First Assessed: 05/24/18 Time First Assessed: 1434 Location: Foot Location Orientation: Right;Anterior;Posterior Wound Description (Comments): Diffuse wound at Metatarsal heads on dorsal and volar foot surface Present on Admission: Yes   Dressing Type  Impregnated gauze (bismuth);Compression wrap   xeroform wiht profore lite   Dressing Changed   Changed    Dressing Status  Clean;Dry    Dressing Change Frequency  PRN    Site / Wound Assessment  Dry;Pink;Pale;Yellow    % Wound base Red or Granulating  50%    % Wound base Yellow/Fibrinous Exudate  30%   yellow dry and callous   % Wound base Other/Granulation Tissue (Comment)  20%   pale/ pink tissue   Peri-wound Assessment  Intact;Edema;Erythema (non-blanchable)    Treatment  Cleansed;Debridement (Selective)    Pulsed lavage therapy - wound location  plantar aspect    Pulsed Lavage with Suction (psi)  4 psi    Pulsed Lavage with Suction - Normal Saline Used  1000 mL    Pulsed Lavage Tip  Tip with splash shield    Selective Debridement - Location  plantar/dorsal foot and medial ankle    Selective Debridement - Tools Used  Forceps;Scalpel    Selective Debridement - Tissue Removed  Slough, dried exudated, devitalized tissue, necrotic eschar, callous.    Wound Therapy - Clinical Statement  Vast amount of dry skin, dry hard exudate and callous removed this session.  With removal of callous on plantar aspect with increase in wound size, min to moderate drainage.  Pt stated he believes the PLS complete 2 sessions ago increased the drainage.  Pt educated on purpose of PLS as well as s/s of infection.  Pt stated he was no xray complete and does continue to have redness and heat to foot.  Evaluation PT stepped in and encouraged pt to get xray, plans to call MD to schedule apt so pt does not have to wait for long periods of time at ER.  Able to removal vast amount of dry exudate on forefoot, changed dressings to xeroform to increase moisture to area.  Foot continues to be very dry, lotion applied prior dressings.  No reports of pain through session.    Wound Therapy - Functional Problem List  Impared walking/mobility, impaired self care/ADL performance    Factors Delaying/Impairing Wound Healing  Altered sensation;Tobacco use;Vascular compromise    Hydrotherapy Plan  Debridement;Dressing  change;Patient/family education;Pulsatile lavage with suction    Wound Therapy - Frequency  3X / week    Wound Therapy - Current Recommendations  PT    Wound Plan  Measure weekly. Conitnued with profore lite for edema mangement. Conitnue with topical agents and debridement as appropriate for wound healing stage.    Dressing   Rt Medial ankle: xeroform, profore lite    Dressing  Foot wound: xeroform and profore lite    Dressing  Plantar aspect: silverhydrofiber and profore lite                PT Short Term Goals - 05/24/18 1740      PT SHORT TERM GOAL #1   Title  Patient will have no callous present at base of Rt foot to indicate improve WB tolerance and self care.    Time  4    Period  Weeks    Status  New    Target Date  06/21/18      PT SHORT TERM GOAL #2   Title  Wound will reduce by 50% surface area to indicate wound healing and progress towards proliferative stage of healing away from inflammatory stage.    Time  4    Period  Weeks    Status  New        PT Long Term Goals - 05/24/18 1742      PT LONG TERM GOAL #1   Title  Rt medial ankle wound will be healing with 90% reducation in surface area indicating goof approximation of wound with no evidence of imparied periwound integrity.    Time  8    Period  Weeks    Status  New    Target Date  07/19/18      PT LONG TERM GOAL #2   Title  Rt foot wound will be healing with 90% reducation in surface area indicating goof approximation of wound with no evidence of imparied periwound integrity.    Time  8    Period  Weeks    Status  New      PT LONG TERM GOAL #3   Title  Patient will have 75% reduced edema and if appropriate  per vascular MD (Possible Dr. Randie Heinz) patient will be independent in donnig/doffing and carign for compresison garment to manage edema of Rt LE.    Time  8    Period  Weeks    Status  New              Patient will benefit from skilled therapeutic intervention in order to improve the  following deficits and impairments:     Visit Diagnosis: Open wound of right ankle, initial encounter  Open wound of right foot, initial encounter     Problem List Patient Active Problem List   Diagnosis Date Noted  . Status post transmetatarsal amputation of right foot (HCC)   . Subacute osteomyelitis, right ankle and foot (HCC)   . Infected blister of foot 08/13/2017  . Pressure injury of skin 08/12/2017  . Weakness 08/11/2017  . COPD (chronic obstructive pulmonary disease) (HCC) 08/11/2017  . Cellulitis of right lower extremity 08/11/2017  . Fall at home, initial encounter 08/11/2017  . Thrombocytopenia (HCC) 08/11/2017  . Weakness generalized 08/11/2017  . Hypokalemia 08/11/2017  . Essential hypertension 08/11/2017   Becky Sax, LPTA; CBIS [phone]  Juel Burrow 06/14/2018, 5:40 PM  [facility] [address] [place], [place], [address] Phone: [phone]   Fax:  [phone]  Name: Jacquelyn Antony MRN: [id number] Date of Birth: 1945/12/15

## 2018-06-16 ENCOUNTER — Ambulatory Visit: Payer: Medicare Other | Admitting: Family

## 2018-06-16 ENCOUNTER — Other Ambulatory Visit (HOSPITAL_COMMUNITY): Payer: Self-pay | Admitting: Internal Medicine

## 2018-06-16 ENCOUNTER — Ambulatory Visit (HOSPITAL_COMMUNITY)
Admission: RE | Admit: 2018-06-16 | Discharge: 2018-06-16 | Disposition: A | Discharge status: Home / Self Care | Payer: Medicare HMO | Source: Ambulatory Visit | Attending: Internal Medicine | Admitting: Internal Medicine

## 2018-06-16 ENCOUNTER — Encounter (HOSPITAL_COMMUNITY): Payer: Medicare Other

## 2018-06-16 DIAGNOSIS — L03115 Cellulitis of right lower limb: Secondary | ICD-10-CM

## 2018-06-16 DIAGNOSIS — R58 Hemorrhage, not elsewhere classified: Secondary | ICD-10-CM | POA: Diagnosis not present

## 2018-06-19 ENCOUNTER — Ambulatory Visit (HOSPITAL_COMMUNITY): Payer: Medicare HMO | Attending: Internal Medicine

## 2018-06-19 ENCOUNTER — Other Ambulatory Visit: Payer: Self-pay

## 2018-06-19 ENCOUNTER — Encounter (HOSPITAL_COMMUNITY): Payer: Self-pay

## 2018-06-19 DIAGNOSIS — S91301A Unspecified open wound, right foot, initial encounter: Secondary | ICD-10-CM | POA: Insufficient documentation

## 2018-06-19 DIAGNOSIS — S91001A Unspecified open wound, right ankle, initial encounter: Secondary | ICD-10-CM | POA: Diagnosis present

## 2018-06-19 NOTE — Therapy (Signed)
[facility] [address] [place], [place], [address] Phone: [phone]   Fax:  [phone]  Wound Care Therapy  Patient Details  Name: Jorge Clements MRN: [id number] Date of Birth: 13-Nov-1945 Referring Provider (PT): Benita Stabile, MD   Encounter Date: 06/19/2018  PT End of Session - 06/19/18 1814    Visit Number  7    Number of Visits  24    Date for PT Re-Evaluation  07/19/18   Minireassess 06/21/18   Authorization Type  [employer] medicare PPO ( no auth required, no visit limit)    Authorization Time Period  05/24/2018 - 07/21/2018    Authorization - Visit Number  7    Authorization - Number of Visits  10    PT Start Time  1525    PT Stop Time  1625    PT Time Calculation (min)  60 min    Activity Tolerance  Patient tolerated treatment well    Behavior During Therapy  [facility] for tasks assessed/performed       Past Medical History:  Diagnosis Date  . COPD (chronic obstructive pulmonary disease) (HCC)     Past Surgical History:  Procedure Laterality Date  . ABDOMINAL AORTOGRAM N/A 08/17/2017   Procedure: ABDOMINAL AORTOGRAM;  Surgeon: Maeola Harman, MD;  Location: [facility] INVASIVE CV LAB;  Service: Cardiovascular;  Laterality: N/A;  . AMPUTATION Right 08/21/2017   Procedure: TRANSMETATARSAL AMPUTATION RIGHT FOOT;  Surgeon: Nadara Mustard, MD;  Location: [facility] OR;  Service: Orthopedics;  Laterality: Right;  . AMPUTATION TOE Right 08/18/2017   Procedure: AMPUTATION RIGHT SECOND AND THIRD TOES;  Surgeon: Maeola Harman, MD;  Location: [facility] OR;  Service: Vascular;  Laterality: Right;  . ENDARTERECTOMY FEMORAL Right 08/18/2017   Procedure: ENDARTERECTOMY RIGHT SUPERFICIAL Alfredia Ferguson;  Surgeon: Maeola Harman, MD;  Location: [facility] OR;  Service: Vascular;  Laterality: Right;  . FEMORAL-POPLITEAL BYPASS GRAFT Right 08/18/2017   Procedure: BYPASS GRAFT RIGHT COMMON FEMORAL TO ABOVE KNEE POPLITEAL ARTERY USING RIGHT REVERSED GREAT  SAPHENOUS VEIN;  Surgeon: Maeola Harman, MD;  Location: [facility] OR;  Service: Vascular;  Laterality: Right;  . LOWER EXTREMITY ANGIOGRAPHY Right 08/17/2017   Procedure: Lower Extremity Angiography;  Surgeon: Maeola Harman, MD;  Location: [facility] INVASIVE CV LAB;  Service: Cardiovascular;  Laterality: Right;  . PERIPHERAL VASCULAR BALLOON ANGIOPLASTY Right 08/17/2017   Procedure: PERIPHERAL VASCULAR BALLOON ANGIOPLASTY;  Surgeon: Maeola Harman, MD;  Location: [facility] INVASIVE CV LAB;  Service: Cardiovascular;  Laterality: Right;  SFA UNABLE TO CROSS  . VEIN HARVEST Right 08/18/2017   Procedure: VEIN HARVEST RIGHT GREAT SAPHENOUS;  Surgeon: Maeola Harman, MD;  Location: [facility] OR;  Service: Vascular;  Laterality: Right;  . WOUND DEBRIDEMENT Right 08/18/2017   Procedure: DEBRIDEMENT WOUND RIGHT FOOT;  Surgeon: Maeola Harman, MD;  Location: [facility] OR;  Service: Vascular;  Laterality: Right;    There were no vitals filed for this visit.    Wound Therapy - 06/19/18 1812    Subjective  Patient arrives with dressing intact, the wrap has loosened and slid down his leg below the bulk of his calf. He denies pain.    Patient and Family Stated Goals  wounds to heal    Date of Onset  --   >1 year ago   Prior Treatments  no self care, prior amputations    Pain Scale  0-10    Pain Score  0-No pain    Evaluation and Treatment Procedures  Explained to Patient/Family  Yes    Evaluation and Treatment Procedures  agreed to    Wound Properties Date First Assessed: 05/26/18 Time First Assessed: 1600 Wound Type: Other (Comment) , opening plantar surface  Location: Foot Location Orientation: Right Present on Admission: Yes   Dressing Type  Silver hydrofiber;Compression wrap   Silver hydrofiber with profore lite   Dressing Changed  Changed    Dressing Status  Clean;Dry;Intact    Dressing Change Frequency  PRN    Site / Wound Assessment  Clean;Dry    % Wound base Red or Granulating   50%   pink/red   % Wound base Yellow/Fibrinous Exudate  50%    % Wound base Other/Granulation Tissue (Comment)  --   callous/dry skin perimeter   Peri-wound Assessment  Intact    Drainage Amount  Minimal    Drainage Description  Serosanguineous;Purulent    Treatment  Cleansed;Debridement (Selective);Hydrotherapy (Pulse lavage)    Wound Properties Date First Assessed: 05/24/18 Time First Assessed: 1432 Wound Type: Other (Comment) Location: Ankle Location Orientation: Right;Medial Wound Description (Comments): Diffuse wound at Rt medial ankle, circular and patchy Present on Admission: Yes   Dressing Type  Impregnated gauze (bismuth);Compression wrap   xeroform wiht profore lite   Dressing Changed  Changed    Dressing Status  Dry;Clean;Intact    Dressing Change Frequency  PRN    Site / Wound Assessment  Pink;Red;Yellow    % Wound base Red or Granulating  80%    % Wound base Other/Granulation Tissue (Comment)  20%   dry, patchy   Peri-wound Assessment  Edema;Erythema (non-blanchable);Intact;Hemosiderin    Drainage Amount  None    Treatment  Cleansed;Debridement (Selective)    Wound Properties Date First Assessed: 05/24/18 Time First Assessed: 1434 Location: Foot Location Orientation: Right;Anterior;Posterior Wound Description (Comments): Diffuse wound at Metatarsal heads on dorsal and volar foot surface Present on Admission: Yes   Dressing Type  Impregnated gauze (bismuth);Compression wrap   xeroform wiht profore lite   Dressing Changed  Changed    Dressing Status  Clean;Dry    Dressing Change Frequency  PRN    Site / Wound Assessment  Dry;Pink;Pale;Yellow    % Wound base Red or Granulating  50%    % Wound base Yellow/Fibrinous Exudate  30%   yellow dry and callous   % Wound base Other/Granulation Tissue (Comment)  20%   pale/ pink tissue   Peri-wound Assessment  Intact;Edema;Erythema (non-blanchable)    Treatment  Cleansed;Debridement (Selective)    Pulsed lavage therapy - wound  location  plantar aspect    Pulsed Lavage with Suction (psi)  4 psi    Pulsed Lavage with Suction - Normal Saline Used  1000 mL    Pulsed Lavage Tip  Tip with splash shield    Selective Debridement - Location  plantar/dorsal foot and medial ankle    Selective Debridement - Tools Used  Forceps;Scalpel    Selective Debridement - Tissue Removed  Slough, dried exudated, devitalized tissue, necrotic eschar, callous.    Wound Therapy - Clinical Statement  Plantar/distal wound at medial forefoot has reduced callous formation of surrounding periwound. His wound presents with pink/red tissue and yellow slough as well as ongoing deep tunnel at medial wound bed. Superficial breakdown on anterior aspect of foot and medial ankle had improved skin integrity and reduced patches/openings. Patient's LE remains red and edematous and he has begun antibiotics again this past Friday. Continued with pulsed lavage to large forefoot wound with tunnel and packed  with silver hydrofiber. Continued with profore lite to manage edema. Jorge Clements will continue to benefit from skilled wound care to promote healing and reduce risk of infection.    Wound Therapy - Functional Problem List  Impared walking/mobility, impaired self care/ADL performance    Factors Delaying/Impairing Wound Healing  Altered sensation;Tobacco use;Vascular compromise    Hydrotherapy Plan  Debridement;Dressing change;Patient/family education;Pulsatile lavage with suction    Wound Therapy - Frequency  3X / week    Wound Therapy - Current Recommendations  PT    Wound Plan  Measure weekly. Conitnued with profore lite for edema mangement. Conitnue with topical agents and debridement as appropriate for wound healing stage.    Dressing   Rt Medial ankle: xeroform, profore lite    Dressing  Foot wound: xeroform and profore lite    Dressing  Forefoot woud: silver hydrofiber and profore lite        PT Short Term Goals - 05/24/18 1740      PT SHORT TERM GOAL #1    Title  Patient will have no callous present at base of Rt foot to indicate improve WB tolerance and self care.    Time  4    Period  Weeks    Status  New    Target Date  06/21/18      PT SHORT TERM GOAL #2   Title  Wound will reduce by 50% surface area to indicate wound healing and progress towards proliferative stage of healing away from inflammatory stage.    Time  4    Period  Weeks    Status  New        PT Long Term Goals - 05/24/18 1742      PT LONG TERM GOAL #1   Title  Rt medial ankle wound will be healing with 90% reducation in surface area indicating goof approximation of wound with no evidence of imparied periwound integrity.    Time  8    Period  Weeks    Status  New    Target Date  07/19/18      PT LONG TERM GOAL #2   Title  Rt foot wound will be healing with 90% reducation in surface area indicating goof approximation of wound with no evidence of imparied periwound integrity.    Time  8    Period  Weeks    Status  New      PT LONG TERM GOAL #3   Title  Patient will have 75% reduced edema and if appropriate per vascular MD (Possible Dr. Randie Heinz) patient will be independent in donnig/doffing and carign for compresison garment to manage edema of Rt LE.    Time  8    Period  Weeks    Status  New        Plan - 06/19/18 1814    Clinical Impression Statement  see above    Rehab Potential  Fair    Clinical Impairments Affecting Rehab Potential  (-) chronic woudn history, (-) decreaed self care motivation    PT Frequency  3x / week    PT Treatment/Interventions  ADLs/Self Care Home Management;Compression bandaging;Manual techniques;Patient/family education   skilled wound care, selective debridment, compression wrapping   PT Next Visit Plan  see above    Consulted and Agree with Plan of Care  Patient       Patient will benefit from skilled therapeutic intervention in order to improve the following deficits and impairments:  Abnormal gait, Decreased balance,  Decreased skin integrity, Increased edema, Impaired sensation, Decreased mobility, Difficulty walking  Visit Diagnosis: Open wound of right ankle, initial encounter  Open wound of right foot, initial encounter     Problem List Patient Active Problem List   Diagnosis Date Noted  . Status post transmetatarsal amputation of right foot (HCC)   . Subacute osteomyelitis, right ankle and foot (HCC)   . Infected blister of foot 08/13/2017  . Pressure injury of skin 08/12/2017  . Weakness 08/11/2017  . COPD (chronic obstructive pulmonary disease) (HCC) 08/11/2017  . Cellulitis of right lower extremity 08/11/2017  . Fall at home, initial encounter 08/11/2017  . Thrombocytopenia (HCC) 08/11/2017  . Weakness generalized 08/11/2017  . Hypokalemia 08/11/2017  . Essential hypertension 08/11/2017    Valentino Saxon, PT, DPT, [facility] Physical Therapist with [facility]  06/19/2018 6:15 PM     [facility] [address] [place], [place], [address] Phone: [phone]   Fax:  [phone]  Name: Jorge Clements MRN: [id number] Date of Birth: 08-Oct-1945

## 2018-06-21 ENCOUNTER — Ambulatory Visit (HOSPITAL_COMMUNITY): Payer: Medicare HMO

## 2018-06-21 ENCOUNTER — Encounter (HOSPITAL_COMMUNITY): Payer: Self-pay

## 2018-06-21 ENCOUNTER — Other Ambulatory Visit: Payer: Self-pay

## 2018-06-21 DIAGNOSIS — S91301A Unspecified open wound, right foot, initial encounter: Secondary | ICD-10-CM | POA: Diagnosis not present

## 2018-06-21 DIAGNOSIS — S91001A Unspecified open wound, right ankle, initial encounter: Secondary | ICD-10-CM

## 2018-06-21 NOTE — Therapy (Signed)
[facility] [address] [place], [place], [address] Phone: [phone]   Fax:  [phone]  Wound Care Therapy/Progress Note  Patient Details  Name: Jorge Clements MRN: [id number] Date of Birth: July 13, 1945 Referring Provider (PT): Celene Squibb, MD   Encounter Date: 06/21/2018  Progress Note Reporting Period 05/24/2018 to 06/21/2018  See note below for Objective Data and Assessment of Progress/Goals.    PT End of Session - 06/21/18 1440    Visit Number  8    Number of Visits  24    Date for PT Re-Evaluation  07/19/18   Minireassess 06/21/18   Authorization Type  [employer] medicare PPO ( no auth required, no visit limit)    Authorization Time Period  05/24/2018 - 07/21/2018    Authorization - Visit Number  1    Authorization - Number of Visits  10    PT Start Time  1324    PT Stop Time  1426    PT Time Calculation (min)  62 min    Activity Tolerance  Patient tolerated treatment well    Behavior During Therapy  WFL for tasks assessed/performed       Past Medical History:  Diagnosis Date  . COPD (chronic obstructive pulmonary disease) ([place])     Past Surgical History:  Procedure Laterality Date  . ABDOMINAL AORTOGRAM N/A 08/17/2017   Procedure: ABDOMINAL AORTOGRAM;  Surgeon: Waynetta Sandy, MD;  Location: [facility];  Service: Cardiovascular;  Laterality: N/A;  . AMPUTATION Right 08/21/2017   Procedure: TRANSMETATARSAL AMPUTATION RIGHT FOOT;  Surgeon: Newt Minion, MD;  Location: [facility];  Service: Orthopedics;  Laterality: Right;  . AMPUTATION TOE Right 08/18/2017   Procedure: AMPUTATION RIGHT SECOND AND THIRD TOES;  Surgeon: Waynetta Sandy, MD;  Location: [facility];  Service: Vascular;  Laterality: Right;  . ENDARTERECTOMY FEMORAL Right 08/18/2017   Procedure: ENDARTERECTOMY RIGHT SUPERFICIAL Jerene Canny;  Surgeon: Waynetta Sandy, MD;  Location: [facility];  Service: Vascular;  Laterality: Right;  .  FEMORAL-POPLITEAL BYPASS GRAFT Right 08/18/2017   Procedure: BYPASS GRAFT RIGHT COMMON FEMORAL TO ABOVE KNEE POPLITEAL ARTERY USING RIGHT REVERSED GREAT SAPHENOUS VEIN;  Surgeon: Waynetta Sandy, MD;  Location: [facility];  Service: Vascular;  Laterality: Right;  . LOWER EXTREMITY ANGIOGRAPHY Right 08/17/2017   Procedure: Lower Extremity Angiography;  Surgeon: Waynetta Sandy, MD;  Location: [facility];  Service: Cardiovascular;  Laterality: Right;  . PERIPHERAL VASCULAR BALLOON ANGIOPLASTY Right 08/17/2017   Procedure: PERIPHERAL VASCULAR BALLOON ANGIOPLASTY;  Surgeon: Waynetta Sandy, MD;  Location: [facility];  Service: Cardiovascular;  Laterality: Right;  SFA UNABLE TO CROSS  . VEIN HARVEST Right 08/18/2017   Procedure: VEIN HARVEST RIGHT GREAT SAPHENOUS;  Surgeon: Waynetta Sandy, MD;  Location: [facility];  Service: Vascular;  Laterality: Right;  . WOUND DEBRIDEMENT Right 08/18/2017   Procedure: DEBRIDEMENT WOUND RIGHT FOOT;  Surgeon: Waynetta Sandy, MD;  Location: [facility];  Service: Vascular;  Laterality: Right;    There were no vitals filed for this visit.    Wound Therapy - 06/21/18 1432    Subjective  Patient arrives with dressign intact. When asked if there was any excessive drainage he states "I don't know I don't check for that". He denies pain.    Patient and Family Stated Goals  wounds to heal    Date of Onset  --   >1 year ago   Prior Treatments  no self  care, prior amputations    Pain Scale  0-10    Pain Score  0-No pain    Evaluation and Treatment Procedures Explained to Patient/Family  Yes    Evaluation and Treatment Procedures  agreed to    Wound Properties Date First Assessed: 05/26/18 Time First Assessed: 1600 Wound Type: Other (Comment) , opening plantar surface  Location: Foot Location Orientation: Right Present on Admission: Yes   Dressing Type  Silver hydrofiber;Compression wrap   Silver hydrofiber with profore lite    Dressing Changed  Changed    Dressing Status  Clean;Dry;Intact    Dressing Change Frequency  PRN    Site / Wound Assessment  Clean;Dry    % Wound base Red or Granulating  50%   pink/red   % Wound base Yellow/Fibrinous Exudate  50%    % Wound base Other/Granulation Tissue (Comment)  --   callous/dry skin perimeter   Peri-wound Assessment  Intact    Wound Length (cm)  2.5 cm   1.8   Wound Width (cm)  2.2 cm   1.6   Wound Depth (cm)  0.8 cm   0.8   Wound Volume (cm^3)  4.4 cm^3    Wound Surface Area (cm^2)  5.5 cm^2    Tunneling (cm)  tunnel from 12-2 O'Clock, 1.3cm in depth    Drainage Amount  Minimal    Drainage Description  Serosanguineous;Purulent    Treatment  Cleansed;Debridement (Selective);Hydrotherapy (Pulse lavage)    Wound Properties Date First Assessed: 05/24/18 Time First Assessed: 1432 Wound Type: Other (Comment) Location: Ankle Location Orientation: Right;Medial Wound Description (Comments): Diffuse wound at Rt medial ankle, circular and patchy Present on Admission: Yes   Dressing Type  Impregnated gauze (bismuth);Compression wrap   xeroform wiht profore lite   Dressing Changed  Changed    Dressing Status  Dry;Clean;Intact    Dressing Change Frequency  PRN    Site / Wound Assessment  Pink;Red;Yellow    % Wound base Red or Granulating  80%    % Wound base Other/Granulation Tissue (Comment)  20%   dry, patchy   Peri-wound Assessment  Edema;Erythema (non-blanchable);Intact;Hemosiderin    Wound Length (cm)  1.6 cm   6.6 on 05/24/18   Wound Width (cm)  2.8 cm   7.7 on 05/24/18   Wound Depth (cm)  0.1 cm   0.1   Wound Volume (cm^3)  0.45 cm^3    Wound Surface Area (cm^2)  4.48 cm^2    Drainage Amount  None    Treatment  Cleansed;Debridement (Selective)    Wound Properties Date First Assessed: 05/24/18 Time First Assessed: 1434 Location: Foot Location Orientation: Right;Anterior;Posterior Wound Description (Comments): Diffuse wound at Metatarsal heads on dorsal and volar  foot surface Present on Admission: Yes   Dressing Type  Impregnated gauze (bismuth);Compression wrap   xeroform wiht profore lite   Dressing Changed  Changed    Dressing Status  Clean;Dry    Dressing Change Frequency  PRN    Site / Wound Assessment  Dry;Pink;Pale;Yellow    % Wound base Red or Granulating  50%    % Wound base Yellow/Fibrinous Exudate  30%   yellow dry and callous   % Wound base Other/Granulation Tissue (Comment)  20%   pale/ pink tissue   Peri-wound Assessment  Intact;Edema;Erythema (non-blanchable)    Wound Length (cm)  3.9 cm   8.6 on 05/24/18   Wound Width (cm)  3.5 cm   12 05/24/18   Wound Depth (cm)  0.1  cm   0.1   Wound Volume (cm^3)  1.36 cm^3    Wound Surface Area (cm^2)  13.65 cm^2    Treatment  Cleansed;Debridement (Selective)    Pulsed lavage therapy - wound location  plantar aspect    Pulsed Lavage with Suction (psi)  4 psi    Pulsed Lavage with Suction - Normal Saline Used  1000 mL    Pulsed Lavage Tip  Tip with splash shield    Selective Debridement - Location  plantar/dorsal foot and medial ankle    Selective Debridement - Tools Used  Forceps;Scalpel    Selective Debridement - Tissue Removed  Slough, dried exudated, devitalized tissue, necrotic eschar, callous.    Wound Therapy - Clinical Statement  Patient's wounds along medial ankle and anterior foot are improving and reducing in size. There are reduced patches of skin breakdown and periwound is intact with reduced signs of maceration. Wound at base of forefoot has increased in size since last measured and depth has remained the same. Tunnel is present in superior/medial corner of woundbed and is 1.3 cm in depth. Continued with selective debridement to all wounds to remove slough and devitalized tissues. Performed pulsed lavage to large medial forefoot wound with tunnel and packed with silver hydrofiber. Continued with profore lite to manage edema. Mr. Gopal will continue to benefit from skilled wound care  to promote healing and reduce risk of infection.    Wound Therapy - Functional Problem List  Impared walking/mobility, impaired self care/ADL performance    Factors Delaying/Impairing Wound Healing  Altered sensation;Tobacco use;Vascular compromise    Hydrotherapy Plan  Debridement;Dressing change;Patient/family education;Pulsatile lavage with suction    Wound Therapy - Frequency  3X / week    Wound Therapy - Current Recommendations  PT    Wound Plan  Measure weekly. Conitnued with profore lite for edema mangement. Conitnue with topical agents and debridement as appropriate for wound healing stage.    Dressing   Rt Medial ankle: xeroform, profore lite    Dressing  Foot wound: xeroform and profore lite    Dressing  Forefoot woud: silver hydrofiber and profore lite        PT Short Term Goals - 06/21/18 1441      PT SHORT TERM GOAL #1   Title  Patient will have no callous present at base of Rt foot to indicate improve WB tolerance and self care.    Baseline  no callous but wound present    Time  4    Period  Weeks    Status  Partially Met    Target Date  06/21/18      PT SHORT TERM GOAL #2   Title  Wound will reduce by 50% surface area to indicate wound healing and progress towards proliferative stage of healing away from inflammatory stage.    Baseline  achieved for ant foot and medial ankle wounds    Time  4    Period  Weeks    Status  Partially Met        PT Long Term Goals - 06/21/18 1442      PT LONG TERM GOAL #1   Title  Rt medial ankle wound will be healing with 90% reducation in surface area indicating goof approximation of wound with no evidence of imparied periwound integrity.    Time  8    Period  Weeks    Status  On-going      PT LONG TERM GOAL #2   Title  Rt foot wound will be healing with 90% reducation in surface area indicating goof approximation of wound with no evidence of imparied periwound integrity.    Time  8    Period  Weeks    Status  On-going       PT LONG TERM GOAL #3   Title  Patient will have 75% reduced edema and if appropriate per vascular MD (Possible Dr. Donzetta Matters) patient will be independent in donnig/doffing and carign for compresison garment to manage edema of Rt LE.    Time  8    Period  Weeks    Status  On-going        Plan - 06/21/18 1441    Clinical Impression Statement  see above    Rehab Potential  Fair    Clinical Impairments Affecting Rehab Potential  (-) chronic woudn history, (-) decreaed self care motivation    PT Frequency  3x / week    PT Treatment/Interventions  ADLs/Self Care Home Management;Compression bandaging;Manual techniques;Patient/family education   skilled wound care, selective debridment, compression wrapping   PT Next Visit Plan  see above    Consulted and Agree with Plan of Care  Patient       Patient will benefit from skilled therapeutic intervention in order to improve the following deficits and impairments:  Abnormal gait, Decreased balance, Decreased skin integrity, Increased edema, Impaired sensation, Decreased mobility, Difficulty walking  Visit Diagnosis: Open wound of right ankle, initial encounter  Open wound of right foot, initial encounter     Problem List Patient Active Problem List   Diagnosis Date Noted  . Status post transmetatarsal amputation of right foot ([place])   . Subacute osteomyelitis, right ankle and foot ([place] [facility] [place])   . Infected blister of foot 08/13/2017  . Pressure injury of skin 08/12/2017  . Weakness 08/11/2017  . COPD (chronic obstructive pulmonary disease) ([place]) 08/11/2017  . Cellulitis of right lower extremity 08/11/2017  . Fall at home, initial encounter 08/11/2017  . Thrombocytopenia ([place]) 08/11/2017  . Weakness generalized 08/11/2017  . Hypokalemia 08/11/2017  . Essential hypertension 08/11/2017    Kipp Brood, PT, DPT, [facility] Physical Therapist with [facility]  06/21/2018 2:43 PM    [facility] [address] [place], [place], [address] Phone: [phone]   Fax:  [phone]  Name: Jorge Clements MRN: [id number] Date of Birth: 03-10-1946

## 2018-06-23 ENCOUNTER — Encounter (HOSPITAL_COMMUNITY): Payer: Self-pay

## 2018-06-23 ENCOUNTER — Ambulatory Visit (HOSPITAL_COMMUNITY): Payer: Medicare HMO

## 2018-06-23 DIAGNOSIS — S91301A Unspecified open wound, right foot, initial encounter: Secondary | ICD-10-CM | POA: Diagnosis not present

## 2018-06-23 DIAGNOSIS — S91001A Unspecified open wound, right ankle, initial encounter: Secondary | ICD-10-CM | POA: Diagnosis not present

## 2018-06-23 NOTE — Therapy (Signed)
[facility] [place], [place], [address] Phone: [phone]   Fax:  [phone]  Wound Care Therapy  Patient Details  Name: Jorge Clements MRN: [id number] Date of Birth: 14-Dec-1945 Referring Provider (PT): Celene Squibb, MD   Encounter Date: 06/23/2018  PT End of Session - 06/23/18 1458    Visit Number  9    Number of Visits  24    Date for PT Re-Evaluation  07/19/18   Minireassess 06/21/18   Authorization Type  [employer] medicare PPO ( no auth required, no visit limit)    Authorization Time Period  05/24/2018 - 07/21/2018    Authorization - Visit Number  2    Authorization - Number of Visits  10    PT Start Time  7106    PT Stop Time  1435    PT Time Calculation (min)  47 min    Activity Tolerance  Patient tolerated treatment well    Behavior During Therapy  Jorge Clements for tasks assessed/performed       Past Medical History:  Diagnosis Date  . COPD (chronic obstructive pulmonary disease) ([place])     Past Surgical History:  Procedure Laterality Date  . ABDOMINAL AORTOGRAM N/A 08/17/2017   Procedure: ABDOMINAL AORTOGRAM;  Surgeon: Waynetta Sandy, MD;  Location: [facility];  Service: Cardiovascular;  Laterality: N/A;  . AMPUTATION Right 08/21/2017   Procedure: TRANSMETATARSAL AMPUTATION RIGHT FOOT;  Surgeon: Newt Minion, MD;  Location: [facility];  Service: Orthopedics;  Laterality: Right;  . AMPUTATION TOE Right 08/18/2017   Procedure: AMPUTATION RIGHT SECOND AND THIRD TOES;  Surgeon: Waynetta Sandy, MD;  Location: [facility];  Service: Vascular;  Laterality: Right;  . ENDARTERECTOMY FEMORAL Right 08/18/2017   Procedure: ENDARTERECTOMY RIGHT SUPERFICIAL Jerene Canny;  Surgeon: Waynetta Sandy, MD;  Location: [facility];  Service: Vascular;  Laterality: Right;  . FEMORAL-POPLITEAL BYPASS GRAFT Right 08/18/2017   Procedure: BYPASS GRAFT RIGHT COMMON FEMORAL TO ABOVE KNEE POPLITEAL ARTERY USING RIGHT REVERSED GREAT  SAPHENOUS VEIN;  Surgeon: Waynetta Sandy, MD;  Location: [facility];  Service: Vascular;  Laterality: Right;  . LOWER EXTREMITY ANGIOGRAPHY Right 08/17/2017   Procedure: Lower Extremity Angiography;  Surgeon: Waynetta Sandy, MD;  Location: [facility];  Service: Cardiovascular;  Laterality: Right;  . PERIPHERAL VASCULAR BALLOON ANGIOPLASTY Right 08/17/2017   Procedure: PERIPHERAL VASCULAR BALLOON ANGIOPLASTY;  Surgeon: Waynetta Sandy, MD;  Location: [facility];  Service: Cardiovascular;  Laterality: Right;  SFA UNABLE TO CROSS  . VEIN HARVEST Right 08/18/2017   Procedure: VEIN HARVEST RIGHT GREAT SAPHENOUS;  Surgeon: Waynetta Sandy, MD;  Location: [facility];  Service: Vascular;  Laterality: Right;  . WOUND DEBRIDEMENT Right 08/18/2017   Procedure: DEBRIDEMENT WOUND RIGHT FOOT;  Surgeon: Waynetta Sandy, MD;  Location: [facility];  Service: Vascular;  Laterality: Right;    There were no vitals filed for this visit.   Subjective Assessment - 06/23/18 1443    Subjective  Pt arrived with dressing intact, no reports of pain.  Reports he will finish antiobiotics through Sunday                Wound Therapy - 06/23/18 1444    Subjective  Pt arrived with dressing intact, no reports of pain.  Reports he will finish antiobiotics through Sunday    Patient and Family Stated Goals  wounds to heal    Date of Onset  --   >  1 year ago   Prior Treatments  no self care, prior amputations    Pain Scale  0-10    Pain Score  0-No pain    Evaluation and Treatment Procedures Explained to Patient/Family  Yes    Evaluation and Treatment Procedures  agreed to    Wound Properties Date First Assessed: 05/26/18 Time First Assessed: 1600 Wound Type: Other (Comment) , opening plantar surface  Location: Foot Location Orientation: Right Present on Admission: Yes   Dressing Type  Silver hydrofiber;Compression wrap   Silver hydrofiber, ABD pad and profore lite with  netting   Dressing Changed  Changed    Dressing Status  Clean;Dry;Intact    Dressing Change Frequency  PRN    Site / Wound Assessment  Clean;Dry    % Wound base Red or Granulating  50%   pink/red   % Wound base Yellow/Fibrinous Exudate  50%    % Wound base Other/Granulation Tissue (Comment)  --   dry skin perimeter   Peri-wound Assessment  Intact    Wound Length (cm)  2.5 cm    Wound Width (cm)  2.3 cm    Wound Depth (cm)  0.7 cm    Wound Volume (cm^3)  4.03 cm^3    Wound Surface Area (cm^2)  5.75 cm^2    Tunneling (cm)  tunnel from 12-2:00 1.3 cm in depth    Drainage Amount  Minimal    Drainage Description  Serosanguineous;Purulent    Treatment  Cleansed;Debridement (Selective)    Wound Properties Date First Assessed: 05/24/18 Time First Assessed: 1432 Wound Type: Other (Comment) Location: Ankle Location Orientation: Right;Medial Wound Description (Comments): Diffuse wound at Rt medial ankle, circular and patchy Present on Admission: Yes   Dressing Type  Impregnated gauze (bismuth);Compression wrap   xeroform wiht profore lite   Dressing Changed  Changed    Dressing Status  Dry;Clean;Intact    Dressing Change Frequency  PRN    Site / Wound Assessment  Pink;Red;Yellow    % Wound base Red or Granulating  80%    % Wound base Other/Granulation Tissue (Comment)  20%   dry patchy   Peri-wound Assessment  Edema;Erythema (non-blanchable);Intact;Hemosiderin    Drainage Amount  None    Treatment  Cleansed    Wound Properties Date First Assessed: 05/24/18 Time First Assessed: 5188 Location: Foot Location Orientation: Right;Anterior;Posterior Wound Description (Comments): Diffuse wound at Metatarsal heads on dorsal and volar foot surface Present on Admission: Yes   Dressing Type  Impregnated gauze (bismuth);Compression wrap   xeroform with profore lite   Dressing Changed  Changed    Dressing Status  Clean;Dry    Dressing Change Frequency  PRN    Site / Wound Assessment   Dry;Pink;Pale;Yellow    % Wound base Red or Granulating  60%    % Wound base Yellow/Fibrinous Exudate  20%   yellow dry and calloused   % Wound base Other/Granulation Tissue (Comment)  20%   pale/pink tissue   Peri-wound Assessment  Intact;Edema;Erythema (non-blanchable)    Treatment  Cleansed    Pulsed lavage therapy - wound location  plantar aspect    Pulsed Lavage with Suction (psi)  4 psi    Pulsed Lavage with Suction - Normal Saline Used  1000 mL    Pulsed Lavage Tip  Tip with splash shield    Selective Debridement - Location  plantar/dorsal foot and medial ankle    Selective Debridement - Tools Used  Forceps;Scalpel    Selective Debridement - Tissue Removed  Slough, dried exudated, devitalized tissue, necrotic eschar, callous.    Wound Therapy - Clinical Statement  Main focus this session with wound on plantar aspect of foot as no debridement necessary for dorsal aspect of foot today.  Wound continues to have tunnel with moderate drainage from wound through dressings today.  Selective debriement for dry skin/callous perimeter and continued with PLS for wound cleansing.  Continued to pack tunnel with silver hydrofiber and profore lite applied for edema control.      Wound Therapy - Functional Problem List  Impared walking/mobility, impaired self care/ADL performance    Factors Delaying/Impairing Wound Healing  Altered sensation;Tobacco use;Vascular compromise    Hydrotherapy Plan  Debridement;Dressing change;Patient/family education;Pulsatile lavage with suction    Wound Therapy - Frequency  3X / week    Wound Therapy - Current Recommendations  PT    Wound Plan  Measure weekly. Conitnued with profore lite for edema mangement. Conitnue with topical agents and debridement as appropriate for wound healing stage.    Dressing   Rt Medial ankle: xeroform, profore lite    Dressing  Foot wound: xeroform and profore lite    Dressing  Plantar aspect forefoot: silverhydrofiber, ABD and profore lite                 PT Short Term Goals - 06/21/18 1441      PT SHORT TERM GOAL #1   Title  Patient will have no callous present at base of Rt foot to indicate improve WB tolerance and self care.    Baseline  no callous but wound present    Time  4    Period  Weeks    Status  Partially Met    Target Date  06/21/18      PT SHORT TERM GOAL #2   Title  Wound will reduce by 50% surface area to indicate wound healing and progress towards proliferative stage of healing away from inflammatory stage.    Baseline  achieved for ant foot and medial ankle wounds    Time  4    Period  Weeks    Status  Partially Met        PT Long Term Goals - 06/21/18 1442      PT LONG TERM GOAL #1   Title  Rt medial ankle wound will be healing with 90% reducation in surface area indicating goof approximation of wound with no evidence of imparied periwound integrity.    Time  8    Period  Weeks    Status  On-going      PT LONG TERM GOAL #2   Title  Rt foot wound will be healing with 90% reducation in surface area indicating goof approximation of wound with no evidence of imparied periwound integrity.    Time  8    Period  Weeks    Status  On-going      PT LONG TERM GOAL #3   Title  Patient will have 75% reduced edema and if appropriate per vascular MD (Possible Dr. Donzetta Matters) patient will be independent in donnig/doffing and carign for compresison garment to manage edema of Rt LE.    Time  8    Period  Weeks    Status  On-going              Patient will benefit from skilled therapeutic intervention in order to improve the following deficits and impairments:     Visit Diagnosis: Open wound of right ankle, initial encounter  Open wound of right foot, initial encounter     Problem List Patient Active Problem List   Diagnosis Date Noted  . Status post transmetatarsal amputation of right foot ([place])   . Subacute osteomyelitis, right ankle and foot ([place])   . Infected blister of foot  08/13/2017  . Pressure injury of skin 08/12/2017  . Weakness 08/11/2017  . COPD (chronic obstructive pulmonary disease) ([place]) 08/11/2017  . Cellulitis of right lower extremity 08/11/2017  . Fall at home, initial encounter 08/11/2017  . Thrombocytopenia ([place]) 08/11/2017  . Weakness generalized 08/11/2017  . Hypokalemia 08/11/2017  . Essential hypertension 08/11/2017   Jorge Clements, [place]; [place]  Aldona Lento 06/23/2018, 3:00 PM  [facility] [address] [place], [place], [address] Phone: [phone]   Fax:  [phone]  Name: Jorge Clements MRN: [id number] Date of Birth: 08-17-1945

## 2018-06-26 ENCOUNTER — Ambulatory Visit (HOSPITAL_COMMUNITY): Payer: Medicare HMO

## 2018-06-26 ENCOUNTER — Other Ambulatory Visit: Payer: Self-pay

## 2018-06-26 ENCOUNTER — Encounter (HOSPITAL_COMMUNITY): Payer: Self-pay

## 2018-06-26 DIAGNOSIS — S91001A Unspecified open wound, right ankle, initial encounter: Secondary | ICD-10-CM | POA: Diagnosis not present

## 2018-06-26 DIAGNOSIS — S91301A Unspecified open wound, right foot, initial encounter: Secondary | ICD-10-CM | POA: Diagnosis not present

## 2018-06-26 NOTE — Therapy (Signed)
[place], [place], [address] Phone: [phone]   Fax:  [phone]  Wound Care Therapy  Patient Details  Name: Jorge Clements MRN: [id number] Date of Birth: [birth year] Referring Provider (PT): Celene Squibb, MD   Encounter Date: 06/26/2018  PT End of Session - 06/26/18 1433    Visit Number  10    Number of Visits  24    Date for PT Re-Evaluation  07/19/18   Minireassess 06/21/18   Authorization Type  [employer] medicare PPO ( no auth required, no visit limit)    Authorization Time Period  05/24/2018 - 07/21/2018    Authorization - Visit Number  3    Authorization - Number of Visits  10    PT Start Time  1306    PT Stop Time  1412    PT Time Calculation (min)  66 min    Activity Tolerance  Patient tolerated treatment well    Behavior During Therapy  [facility] for tasks assessed/performed       Past Medical History:  Diagnosis Date  . COPD (chronic obstructive pulmonary disease) ([place])     Past Surgical History:  Procedure Laterality Date  . ABDOMINAL AORTOGRAM N/A 08/17/2017   Procedure: ABDOMINAL AORTOGRAM;  Surgeon: Waynetta Sandy, MD;  Location: [facility];  Service: Cardiovascular;  Laterality: N/A;  . AMPUTATION Right 08/21/2017   Procedure: TRANSMETATARSAL AMPUTATION RIGHT FOOT;  Surgeon: Newt Minion, MD;  Location: [facility];  Service: Orthopedics;  Laterality: Right;  . AMPUTATION TOE Right 08/18/2017   Procedure: AMPUTATION RIGHT SECOND AND THIRD TOES;  Surgeon: Waynetta Sandy, MD;  Location: [facility];  Service: Vascular;  Laterality: Right;  . ENDARTERECTOMY FEMORAL Right 08/18/2017   Procedure: ENDARTERECTOMY RIGHT SUPERFICIAL Jerene Canny;  Surgeon: Waynetta Sandy, MD;  Location: [facility];  Service: Vascular;  Laterality: Right;  . FEMORAL-POPLITEAL BYPASS GRAFT Right 08/18/2017   Procedure: BYPASS GRAFT RIGHT COMMON FEMORAL TO ABOVE KNEE POPLITEAL ARTERY USING RIGHT REVERSED GREAT  SAPHENOUS VEIN;  Surgeon: Waynetta Sandy, MD;  Location: [facility];  Service: Vascular;  Laterality: Right;  . LOWER EXTREMITY ANGIOGRAPHY Right 08/17/2017   Procedure: Lower Extremity Angiography;  Surgeon: Waynetta Sandy, MD;  Location: [facility];  Service: Cardiovascular;  Laterality: Right;  . PERIPHERAL VASCULAR BALLOON ANGIOPLASTY Right 08/17/2017   Procedure: PERIPHERAL VASCULAR BALLOON ANGIOPLASTY;  Surgeon: Waynetta Sandy, MD;  Location: [facility];  Service: Cardiovascular;  Laterality: Right;  SFA UNABLE TO CROSS  . VEIN HARVEST Right 08/18/2017   Procedure: VEIN HARVEST RIGHT GREAT SAPHENOUS;  Surgeon: Waynetta Sandy, MD;  Location: [facility];  Service: Vascular;  Laterality: Right;  . WOUND DEBRIDEMENT Right 08/18/2017   Procedure: DEBRIDEMENT WOUND RIGHT FOOT;  Surgeon: Waynetta Sandy, MD;  Location: [facility];  Service: Vascular;  Laterality: Right;    There were no vitals filed for this visit.    Wound Therapy - 06/26/18 1425    Subjective  Patient arrives with dressing intact. He denies pain and reports he has finished his antibiotics.     Patient and Family Stated Goals  wounds to heal    Date of Onset  --   >1 year ago   Prior Treatments  no self care, prior amputations    Pain Scale  0-10    Pain Score  0-No pain    Evaluation and Treatment Procedures Explained to Patient/Family  Yes  Evaluation and Treatment Procedures  agreed to    Wound Properties Date First Assessed: 05/26/18 Time First Assessed: 1600 Wound Type: Other (Comment) , opening plantar surface  Location: Foot Location Orientation: Right Present on Admission: Yes   Dressing Type  Silver hydrofiber;Compression wrap   Silver hydrofiber, ABD pad and profore lite with netting   Dressing Changed  Changed    Dressing Status  Old drainage    Dressing Change Frequency  PRN    Site / Wound Assessment  Clean;Dry    % Wound base Red or Granulating  55%    pink/red   % Wound base Yellow/Fibrinous Exudate  30%    % Wound base Other/Granulation Tissue (Comment)  15%   pale tissue   Peri-wound Assessment  Intact    Drainage Amount  Moderate    Drainage Description  Serosanguineous    Treatment  Cleansed;Debridement (Selective);Hydrotherapy (Pulse lavage)    Wound Properties Date First Assessed: 05/24/18 Time First Assessed: 1432 Wound Type: Other (Comment) Location: Ankle Location Orientation: Right;Medial Wound Description (Comments): Diffuse wound at Rt medial ankle, circular and patchy Present on Admission: Yes   Dressing Type  Impregnated gauze (bismuth);Compression wrap   xeroform wiht profore lite   Dressing Changed  Changed    Dressing Status  Old drainage    Dressing Change Frequency  PRN    Site / Wound Assessment  Pink;Red;Yellow    % Wound base Red or Granulating  10%   areas healing/closing over   % Wound base Other/Granulation Tissue (Comment)  90%   dry patches   Peri-wound Assessment  Edema;Erythema (non-blanchable);Intact;Hemosiderin    Drainage Amount  None    Treatment  Cleansed    Wound Properties Date First Assessed: 05/24/18 Time First Assessed: 3151 Location: Foot Location Orientation: Right;Anterior;Posterior Wound Description (Comments): Diffuse wound at Metatarsal heads on dorsal and volar foot surface Present on Admission: Yes   Dressing Type  Impregnated gauze (bismuth);Compression wrap   xeroform with profore lite   Dressing Changed  Changed    Dressing Status  Old drainage    Dressing Change Frequency  PRN    Site / Wound Assessment  Dry;Pink;Pale;Yellow    % Wound base Red or Granulating  10%   areas healing/closing over   % Wound base Yellow/Fibrinous Exudate  --    % Wound base Other/Granulation Tissue (Comment)  90%   dry patches   Peri-wound Assessment  Intact;Edema;Erythema (non-blanchable)    Drainage Amount  None    Treatment  Cleansed    Pulsed lavage therapy - wound location  plantar aspect     Pulsed Lavage with Suction (psi)  4 psi    Pulsed Lavage with Suction - Normal Saline Used  1000 mL    Pulsed Lavage Tip  Tip with splash shield    Selective Debridement - Location  plantar/dorsal foot and medial ankle    Selective Debridement - Tools Used  Forceps;Scalpel    Selective Debridement - Tissue Removed  Slough, dried exudated, devitalized tissue, necrotic eschar, callous.    Wound Therapy - Clinical Statement  Patient's skin on anterior dorsum of foot and medial ankle continue to show improved integrity and required cleansing. Wound on medial dorsum of forefoot continues to be deep with slightly increased drainage and open blister along superior/medial boarder today that was easily removed. Wound bed appears to be more vascularized with increased redness to the tissue and callous around margins of wound was debrided today. Continued with pulsed lavage to  wound bed and packed with silver hydrofiber follow by ABD and profore lite. Patient will continue to benefit from skilled wound care to promote wound healing and reduce infection risk.    Wound Therapy - Functional Problem List  Impared walking/mobility, impaired self care/ADL performance    Factors Delaying/Impairing Wound Healing  Altered sensation;Tobacco use;Vascular compromise    Hydrotherapy Plan  Debridement;Dressing change;Patient/family education;Pulsatile lavage with suction    Wound Therapy - Frequency  3X / week    Wound Therapy - Current Recommendations  PT    Wound Plan  Measure weekly. Conitnued with profore lite for edema mangement. Conitnue with topical agents and debridement as appropriate for wound healing stage.    Dressing   Rt Medial ankle: xeroform, profore lite    Dressing  Foot wound: xeroform and profore lite    Dressing  Plantar aspect forefoot: silverhydrofiber, ABD and profore lite        PT Short Term Goals - 06/21/18 1441      PT SHORT TERM GOAL #1   Title  Patient will have no callous present at base  of Rt foot to indicate improve WB tolerance and self care.    Baseline  no callous but wound present    Time  4    Period  Weeks    Status  Partially Met    Target Date  06/21/18      PT SHORT TERM GOAL #2   Title  Wound will reduce by 50% surface area to indicate wound healing and progress towards proliferative stage of healing away from inflammatory stage.    Baseline  achieved for ant foot and medial ankle wounds    Time  4    Period  Weeks    Status  Partially Met        PT Long Term Goals - 06/21/18 1442      PT LONG TERM GOAL #1   Title  Rt medial ankle wound will be healing with 90% reducation in surface area indicating goof approximation of wound with no evidence of imparied periwound integrity.    Time  8    Period  Weeks    Status  On-going      PT LONG TERM GOAL #2   Title  Rt foot wound will be healing with 90% reducation in surface area indicating goof approximation of wound with no evidence of imparied periwound integrity.    Time  8    Period  Weeks    Status  On-going      PT LONG TERM GOAL #3   Title  Patient will have 75% reduced edema and if appropriate per vascular MD (Possible Dr. Donzetta Matters) patient will be independent in donnig/doffing and carign for compresison garment to manage edema of Rt LE.    Time  8    Period  Weeks    Status  On-going        Plan - 06/26/18 1432    Clinical Impression Statement  see above    Rehab Potential  Fair    Clinical Impairments Affecting Rehab Potential  (-) chronic woudn history, (-) decreaed self care motivation    PT Frequency  3x / week    PT Treatment/Interventions  ADLs/Self Care Home Management;Compression bandaging;Manual techniques;Patient/family education   skilled wound care, selective debridment, compression wrapping   PT Next Visit Plan  see above    Consulted and Agree with Plan of Care  Patient  Patient will benefit from skilled therapeutic intervention in order to improve the following deficits  and impairments:  Abnormal gait, Decreased balance, Decreased skin integrity, Increased edema, Impaired sensation, Decreased mobility, Difficulty walking  Visit Diagnosis: Open wound of right ankle, initial encounter  Open wound of right foot, initial encounter     Problem List Patient Active Problem List   Diagnosis Date Noted  . Status post transmetatarsal amputation of right foot ([place])   . Subacute osteomyelitis, right ankle and foot ([place])   . Infected blister of foot 08/13/2017  . Pressure injury of skin 08/12/2017  . Weakness 08/11/2017  . COPD (chronic obstructive pulmonary disease) ([place]) 08/11/2017  . Cellulitis of right lower extremity 08/11/2017  . Fall at home, initial encounter 08/11/2017  . Thrombocytopenia ([place]) 08/11/2017  . Weakness generalized 08/11/2017  . Hypokalemia 08/11/2017  . Essential hypertension 08/11/2017    Kipp Brood, PT, DPT, [facility] Physical Therapist with [facility]  06/26/2018 2:33 PM    [place] [facility] [address] [place], [place], [address] Phone: [phone]   Fax:  [phone]  Name: Jorge Clements MRN: [id number] Date of Birth: 09-07-1945

## 2018-06-28 ENCOUNTER — Other Ambulatory Visit: Payer: Self-pay

## 2018-06-28 ENCOUNTER — Encounter (HOSPITAL_COMMUNITY): Payer: Self-pay

## 2018-06-28 ENCOUNTER — Ambulatory Visit (HOSPITAL_COMMUNITY): Payer: Medicare HMO

## 2018-06-28 DIAGNOSIS — S91301A Unspecified open wound, right foot, initial encounter: Secondary | ICD-10-CM

## 2018-06-28 DIAGNOSIS — S91001A Unspecified open wound, right ankle, initial encounter: Secondary | ICD-10-CM

## 2018-06-28 NOTE — Therapy (Signed)
[place], [place], [address] Phone: [phone]   Fax:  [phone]  Wound Care Therapy  Patient Details  Name: Jorge Clements MRN: [id number] Date of Birth: March 20, 1946 Referring Provider (PT): Celene Squibb, MD   Encounter Date: 06/28/2018  PT End of Session - 06/28/18 1459    Visit Number  11    Number of Visits  24    Date for PT Re-Evaluation  07/19/18   Minireassess 06/21/18   Authorization Type  [employer] medicare PPO ( no auth required, no visit limit)    Authorization Time Period  05/24/2018 - 07/21/2018    Authorization - Visit Number  4    Authorization - Number of Visits  10    PT Start Time  1350    PT Stop Time  1440    PT Time Calculation (min)  50 min    Activity Tolerance  Patient tolerated treatment well    Behavior During Therapy  [facility] for tasks assessed/performed       Past Medical History:  Diagnosis Date  . COPD (chronic obstructive pulmonary disease) ([place])     Past Surgical History:  Procedure Laterality Date  . ABDOMINAL AORTOGRAM N/A 08/17/2017   Procedure: ABDOMINAL AORTOGRAM;  Surgeon: Waynetta Sandy, MD;  Location: [facility] [place] [facility];  Service: Cardiovascular;  Laterality: N/A;  . AMPUTATION Right 08/21/2017   Procedure: TRANSMETATARSAL AMPUTATION RIGHT FOOT;  Surgeon: Newt Minion, MD;  Location: [facility];  Service: Orthopedics;  Laterality: Right;  . AMPUTATION TOE Right 08/18/2017   Procedure: AMPUTATION RIGHT SECOND AND THIRD TOES;  Surgeon: Waynetta Sandy, MD;  Location: [facility];  Service: Vascular;  Laterality: Right;  . ENDARTERECTOMY FEMORAL Right 08/18/2017   Procedure: ENDARTERECTOMY RIGHT SUPERFICIAL Jerene Canny;  Surgeon: Waynetta Sandy, MD;  Location: [facility];  Service: Vascular;  Laterality: Right;  . FEMORAL-POPLITEAL BYPASS GRAFT Right 08/18/2017   Procedure: BYPASS GRAFT RIGHT COMMON FEMORAL TO ABOVE KNEE POPLITEAL ARTERY USING RIGHT REVERSED GREAT  SAPHENOUS VEIN;  Surgeon: Waynetta Sandy, MD;  Location: [facility];  Service: Vascular;  Laterality: Right;  . LOWER EXTREMITY ANGIOGRAPHY Right 08/17/2017   Procedure: Lower Extremity Angiography;  Surgeon: Waynetta Sandy, MD;  Location: [facility];  Service: Cardiovascular;  Laterality: Right;  . PERIPHERAL VASCULAR BALLOON ANGIOPLASTY Right 08/17/2017   Procedure: PERIPHERAL VASCULAR BALLOON ANGIOPLASTY;  Surgeon: Waynetta Sandy, MD;  Location: [facility];  Service: Cardiovascular;  Laterality: Right;  SFA UNABLE TO CROSS  . VEIN HARVEST Right 08/18/2017   Procedure: VEIN HARVEST RIGHT GREAT SAPHENOUS;  Surgeon: Waynetta Sandy, MD;  Location: [facility];  Service: Vascular;  Laterality: Right;  . WOUND DEBRIDEMENT Right 08/18/2017   Procedure: DEBRIDEMENT WOUND RIGHT FOOT;  Surgeon: Waynetta Sandy, MD;  Location: [facility];  Service: Vascular;  Laterality: Right;    There were no vitals filed for this visit.   Subjective Assessment - 06/28/18 1446    Subjective  Pt arrived wiht dressing intact.  When asked about drainage pt responded "I don't know, never look at my feet".    Patient Stated Goals  wound to heal    Currently in Pain?  No/denies                Wound Therapy - 06/28/18 1447    Subjective  Pt arrived wiht dressing intact.  When asked about drainage pt responded "I don't know, never look at  my feet".    Patient and Family Stated Goals  wounds to heal    Date of Onset  --   >1 year ago   Prior Treatments  no self care, prior amputations    Pain Scale  0-10    Pain Score  0-No pain    Evaluation and Treatment Procedures Explained to Patient/Family  Yes    Evaluation and Treatment Procedures  agreed to    Wound Properties Date First Assessed: 05/26/18 Time First Assessed: 1600 Wound Type: Other (Comment) , opening plantar surface  Location: Foot Location Orientation: Right Present on Admission: Yes   Dressing Type   Silver hydrofiber;Compression wrap   Silverhydrofiber, ABD, 2x2, profore lite   Dressing Changed  Changed    Dressing Status  Old drainage    Dressing Change Frequency  PRN    Site / Wound Assessment  Clean;Dry    % Wound base Red or Granulating  65%   pink/red   % Wound base Yellow/Fibrinous Exudate  20%    % Wound base Other/Granulation Tissue (Comment)  15%   pale tissue   Peri-wound Assessment  Intact    Wound Length (cm)  2.5 cm   was 2.5   Wound Width (cm)  2 cm   was 2.3   Wound Depth (cm)  0.7 cm    Wound Volume (cm^3)  3.5 cm^3    Wound Surface Area (cm^2)  5 cm^2    Tunneling (cm)  Tunnel from 2-4:00 1cm in depth    Drainage Amount  Moderate    Drainage Description  Serosanguineous    Treatment  Cleansed;Debridement (Selective);Hydrotherapy (Pulse lavage)    Wound Properties Date First Assessed: 05/24/18 Time First Assessed: 1432 Wound Type: Other (Comment) Location: Ankle Location Orientation: Right;Medial Wound Description (Comments): Diffuse wound at Rt medial ankle, circular and patchy Present on Admission: Yes   Dressing Type  Impregnated gauze (bismuth);Compression wrap   xeroform and profore lite   Dressing Changed  Changed    Dressing Status  Old drainage    Dressing Change Frequency  PRN    Site / Wound Assessment  Pink;Red;Yellow    % Wound base Red or Granulating  10%    % Wound base Other/Granulation Tissue (Comment)  90%   dry patches   Peri-wound Assessment  Edema;Erythema (non-blanchable);Intact;Hemosiderin    Drainage Amount  None    Treatment  Cleansed    Wound Properties Date First Assessed: 05/24/18 Time First Assessed: 2248 Location: Foot Location Orientation: Right;Anterior;Posterior Wound Description (Comments): Diffuse wound at Metatarsal heads on dorsal and volar foot surface Present on Admission: Yes   Dressing Type  Impregnated gauze (bismuth);Compression wrap   xeroform and profore lite   Dressing Changed  Changed    Dressing Status  Old  drainage    Dressing Change Frequency  PRN    Site / Wound Assessment  Dry;Pink;Pale;Yellow    % Wound base Red or Granulating  10%   area healing   % Wound base Other/Granulation Tissue (Comment)  90%   dry patch   Peri-wound Assessment  Intact;Edema;Erythema (non-blanchable)    Drainage Amount  None    Treatment  Cleansed    Pulsed lavage therapy - wound location  plantar aspect    Pulsed Lavage with Suction (psi)  4 psi    Pulsed Lavage with Suction - Normal Saline Used  1000 mL    Pulsed Lavage Tip  Tip with splash shield    Selective Debridement - Location  plantar foot    Selective Debridement - Tools Used  Forceps;Scalpel    Selective Debridement - Tissue Removed  Slough, dried exudated, devitalized tissue, necrotic eschar, callous.    Wound Therapy - Clinical Statement  Presents with improved skin integrity and no selective debridement required for medial ankle or metatarsal area, just cleansed and removal of some dry skin.  Noted soft and mushy skin at metatarsal area.  Continued selectived debridement and PLS to plantar aspect for cleaning and removal of slough in wound bed.  Decreased depth with tunneling noted today.  Continued to pack wound wiht silverhydro fiber and profore lite for edema control.      Wound Therapy - Functional Problem List  Impared walking/mobility, impaired self care/ADL performance    Factors Delaying/Impairing Wound Healing  Altered sensation;Tobacco use;Vascular compromise    Hydrotherapy Plan  Debridement;Dressing change;Patient/family education;Pulsatile lavage with suction    Wound Therapy - Frequency  3X / week    Wound Therapy - Current Recommendations  PT    Wound Plan  Measure weekly. Conitnued with profore lite for edema mangement. Conitnue with topical agents and debridement as appropriate for wound healing stage.    Dressing   Rt Medial ankle: xeroform, profore lite    Dressing  Foot wound: xeroform and profore lite    Dressing  Plantar aspect:  silverhydrofiber, ABD and profore lite                PT Short Term Goals - 06/21/18 1441      PT SHORT TERM GOAL #1   Title  Patient will have no callous present at base of Rt foot to indicate improve WB tolerance and self care.    Baseline  no callous but wound present    Time  4    Period  Weeks    Status  Partially Met    Target Date  06/21/18      PT SHORT TERM GOAL #2   Title  Wound will reduce by 50% surface area to indicate wound healing and progress towards proliferative stage of healing away from inflammatory stage.    Baseline  achieved for ant foot and medial ankle wounds    Time  4    Period  Weeks    Status  Partially Met        PT Long Term Goals - 06/21/18 1442      PT LONG TERM GOAL #1   Title  Rt medial ankle wound will be healing with 90% reducation in surface area indicating goof approximation of wound with no evidence of imparied periwound integrity.    Time  8    Period  Weeks    Status  On-going      PT LONG TERM GOAL #2   Title  Rt foot wound will be healing with 90% reducation in surface area indicating goof approximation of wound with no evidence of imparied periwound integrity.    Time  8    Period  Weeks    Status  On-going      PT LONG TERM GOAL #3   Title  Patient will have 75% reduced edema and if appropriate per vascular MD (Possible Dr. Donzetta Matters) patient will be independent in donnig/doffing and carign for compresison garment to manage edema of Rt LE.    Time  8    Period  Weeks    Status  On-going              Patient  will benefit from skilled therapeutic intervention in order to improve the following deficits and impairments:     Visit Diagnosis: Open wound of right ankle, initial encounter  Open wound of right foot, initial encounter     Problem List Patient Active Problem List   Diagnosis Date Noted  . Status post transmetatarsal amputation of right foot ([place])   . Subacute osteomyelitis, right ankle and foot  ([place])   . Infected blister of foot 08/13/2017  . Pressure injury of skin 08/12/2017  . Weakness 08/11/2017  . COPD (chronic obstructive pulmonary disease) ([place]) 08/11/2017  . Cellulitis of right lower extremity 08/11/2017  . Fall at home, initial encounter 08/11/2017  . Thrombocytopenia ([place]) 08/11/2017  . Weakness generalized 08/11/2017  . Hypokalemia 08/11/2017  . Essential hypertension 08/11/2017   Ihor Austin, [place]; [place]  Aldona Lento 06/28/2018, 3:01 PM  [facility] [address] [place], [place], [address] Phone: [phone]   Fax:  [phone]  Name: Jorge Clements MRN: [id number] Date of Birth: 1945/04/24

## 2018-06-30 ENCOUNTER — Ambulatory Visit (HOSPITAL_COMMUNITY): Payer: Medicare HMO

## 2018-06-30 ENCOUNTER — Encounter (HOSPITAL_COMMUNITY): Payer: Self-pay

## 2018-06-30 ENCOUNTER — Encounter (HOSPITAL_COMMUNITY): Payer: Medicare Other

## 2018-06-30 ENCOUNTER — Other Ambulatory Visit: Payer: Self-pay

## 2018-06-30 ENCOUNTER — Ambulatory Visit: Payer: Medicare Other | Admitting: Family

## 2018-06-30 DIAGNOSIS — S91301A Unspecified open wound, right foot, initial encounter: Secondary | ICD-10-CM | POA: Diagnosis not present

## 2018-06-30 DIAGNOSIS — S91001A Unspecified open wound, right ankle, initial encounter: Secondary | ICD-10-CM | POA: Diagnosis not present

## 2018-06-30 NOTE — Therapy (Signed)
[facility] [place], [place], [address] Phone: [phone]   Fax:  [phone]  Wound Care Therapy  Patient Details  Name: Jorge Clements MRN: [id number] Date of Birth: 04/21/1945 Referring Provider (PT): Celene Squibb, MD   Encounter Date: 06/30/2018  PT End of Session - 06/30/18 1507    Visit Number  12    Number of Visits  24    Date for PT Re-Evaluation  07/19/18   Minireassess 06/21/18   Authorization Type  [employer] medicare PPO ( no auth required, no visit limit)    Authorization Time Period  05/24/2018 - 07/21/2018    Authorization - Visit Number  5    Authorization - Number of Visits  10    PT Start Time  4401    PT Stop Time  1450    PT Time Calculation (min)  65 min       Past Medical History:  Diagnosis Date  . COPD (chronic obstructive pulmonary disease) ([place])     Past Surgical History:  Procedure Laterality Date  . ABDOMINAL AORTOGRAM N/A 08/17/2017   Procedure: ABDOMINAL AORTOGRAM;  Surgeon: Waynetta Sandy, MD;  Location: [facility];  Service: Cardiovascular;  Laterality: N/A;  . AMPUTATION Right 08/21/2017   Procedure: TRANSMETATARSAL AMPUTATION RIGHT FOOT;  Surgeon: Newt Minion, MD;  Location: [facility];  Service: Orthopedics;  Laterality: Right;  . AMPUTATION TOE Right 08/18/2017   Procedure: AMPUTATION RIGHT SECOND AND THIRD TOES;  Surgeon: Waynetta Sandy, MD;  Location: [facility];  Service: Vascular;  Laterality: Right;  . ENDARTERECTOMY FEMORAL Right 08/18/2017   Procedure: ENDARTERECTOMY RIGHT SUPERFICIAL Jerene Canny;  Surgeon: Waynetta Sandy, MD;  Location: [facility];  Service: Vascular;  Laterality: Right;  . FEMORAL-POPLITEAL BYPASS GRAFT Right 08/18/2017   Procedure: BYPASS GRAFT RIGHT COMMON FEMORAL TO ABOVE KNEE POPLITEAL ARTERY USING RIGHT REVERSED GREAT SAPHENOUS VEIN;  Surgeon: Waynetta Sandy, MD;  Location: [facility];  Service: Vascular;  Laterality: Right;  .  LOWER EXTREMITY ANGIOGRAPHY Right 08/17/2017   Procedure: Lower Extremity Angiography;  Surgeon: Waynetta Sandy, MD;  Location: [facility];  Service: Cardiovascular;  Laterality: Right;  . PERIPHERAL VASCULAR BALLOON ANGIOPLASTY Right 08/17/2017   Procedure: PERIPHERAL VASCULAR BALLOON ANGIOPLASTY;  Surgeon: Waynetta Sandy, MD;  Location: [facility];  Service: Cardiovascular;  Laterality: Right;  SFA UNABLE TO CROSS  . VEIN HARVEST Right 08/18/2017   Procedure: VEIN HARVEST RIGHT GREAT SAPHENOUS;  Surgeon: Waynetta Sandy, MD;  Location: [facility];  Service: Vascular;  Laterality: Right;  . WOUND DEBRIDEMENT Right 08/18/2017   Procedure: DEBRIDEMENT WOUND RIGHT FOOT;  Surgeon: Waynetta Sandy, MD;  Location: [facility];  Service: Vascular;  Laterality: Right;    There were no vitals filed for this visit.   Subjective Assessment - 06/30/18 1453    Subjective  No issues, pt arrived with dressing intact.      Patient Stated Goals  wound to heal    Currently in Pain?  No/denies                Wound Therapy - 06/30/18 1453    Subjective  No issues, pt arrived with dressing intact.      Patient and Family Stated Goals  wounds to heal    Date of Onset  --   >1 year ago   Prior Treatments  no self care, prior amputations    Pain Scale  0-10    Pain Score  0-No pain    Evaluation and Treatment Procedures Explained to Patient/Family  Yes    Evaluation and Treatment Procedures  agreed to    Wound Properties Date First Assessed: 05/26/18 Time First Assessed: 1600 Wound Type: Other (Comment) , opening plantar surface  Location: Foot Location Orientation: Right Present on Admission: Yes   Dressing Type  Silver hydrofiber;Compression wrap   silverhydrofiber with ABD and medipore tape; profore lite   Dressing Changed  Changed    Dressing Status  Old drainage    Dressing Change Frequency  PRN    Site / Wound Assessment  Clean;Dry    % Wound base  Red or Granulating  75%   pink/red   % Wound base Yellow/Fibrinous Exudate  10%    % Wound base Other/Granulation Tissue (Comment)  15%   pale tissue   Peri-wound Assessment  Intact    Wound Length (cm)  2.3 cm   was 2.5   Wound Width (cm)  2 cm   was 2 cm   Wound Depth (cm)  0.7 cm    Wound Volume (cm^3)  3.22 cm^3    Wound Surface Area (cm^2)  4.6 cm^2    Tunneling (cm)  Tunnel from 2-6 at 1.2 cm in depth    Drainage Amount  Moderate    Drainage Description  Serosanguineous    Treatment  Cleansed;Debridement (Selective);Hydrotherapy (Pulse lavage)    Wound Properties Date First Assessed: 05/24/18 Time First Assessed: 1432 Wound Type: Other (Comment) Location: Ankle Location Orientation: Right;Medial Wound Description (Comments): Diffuse wound at Rt medial ankle, circular and patchy Present on Admission: Yes   Dressing Type  Impregnated gauze (bismuth);Compression wrap    Dressing Changed  Changed    Dressing Status  Old drainage    Dressing Change Frequency  PRN    Site / Wound Assessment  Pink;Red;Yellow    % Wound base Red or Granulating  10%    % Wound base Other/Granulation Tissue (Comment)  90%   dry patch   Peri-wound Assessment  Edema;Erythema (non-blanchable);Intact;Hemosiderin    Drainage Amount  None    Treatment  Cleansed    Wound Properties Date First Assessed: 05/24/18 Time First Assessed: 3016 Location: Foot Location Orientation: Right;Anterior;Posterior Wound Description (Comments): Diffuse wound at Metatarsal heads on dorsal and volar foot surface Present on Admission: Yes   Dressing Type  Impregnated gauze (bismuth);Compression wrap   xeroform, profore lite   Dressing Changed  Changed    Dressing Status  Old drainage    Dressing Change Frequency  PRN    Site / Wound Assessment  Dry;Pink;Pale;Yellow    % Wound base Red or Granulating  10%    % Wound base Other/Granulation Tissue (Comment)  90%   dry patch   Peri-wound Assessment  Intact;Edema;Erythema  (non-blanchable)    Drainage Amount  None    Treatment  Cleansed    Pulsed lavage therapy - wound location  plantar aspect    Pulsed Lavage with Suction (psi)  4 psi    Pulsed Lavage with Suction - Normal Saline Used  1000 mL    Pulsed Lavage Tip  Tip with splash shield    Selective Debridement - Location  plantar foot    Selective Debridement - Tools Used  Forceps;Scalpel    Selective Debridement - Tissue Removed  Slough, dried exudated, devitalized tissue, necrotic eschar, callous.    Wound Therapy - Clinical Statement  Increased maceration surrounding wound on plantar surface.  Selective debridement for removal of unhealthy tissue and overall dry skin.  Noted hypergranulation of wound bed following PLS, added medipore tape to address this.  Noted improved approximation from 6:00-->2:00.  Continued with profore lite for edema control.      Wound Therapy - Functional Problem List  Impared walking/mobility, impaired self care/ADL performance    Factors Delaying/Impairing Wound Healing  Altered sensation;Tobacco use;Vascular compromise    Hydrotherapy Plan  Debridement;Dressing change;Patient/family education;Pulsatile lavage with suction    Wound Therapy - Frequency  3X / week    Wound Therapy - Current Recommendations  PT    Wound Plan  Measure weekly. Conitnued with profore lite for edema mangement. Conitnue with topical agents and debridement as appropriate for wound healing stage.    Dressing   Rt Medial ankle: xeroform, profore lite    Dressing  Foot wound: xeroform and profore lite    Dressing  Plantar aspect: silverhydrofiber, ABD, medipore tape and profore lite.                  PT Short Term Goals - 06/21/18 1441      PT SHORT TERM GOAL #1   Title  Patient will have no callous present at base of Rt foot to indicate improve WB tolerance and self care.    Baseline  no callous but wound present    Time  4    Period  Weeks    Status  Partially Met    Target Date  06/21/18       PT SHORT TERM GOAL #2   Title  Wound will reduce by 50% surface area to indicate wound healing and progress towards proliferative stage of healing away from inflammatory stage.    Baseline  achieved for ant foot and medial ankle wounds    Time  4    Period  Weeks    Status  Partially Met        PT Long Term Goals - 06/21/18 1442      PT LONG TERM GOAL #1   Title  Rt medial ankle wound will be healing with 90% reducation in surface area indicating goof approximation of wound with no evidence of imparied periwound integrity.    Time  8    Period  Weeks    Status  On-going      PT LONG TERM GOAL #2   Title  Rt foot wound will be healing with 90% reducation in surface area indicating goof approximation of wound with no evidence of imparied periwound integrity.    Time  8    Period  Weeks    Status  On-going      PT LONG TERM GOAL #3   Title  Patient will have 75% reduced edema and if appropriate per vascular MD (Possible Dr. Donzetta Matters) patient will be independent in donnig/doffing and carign for compresison garment to manage edema of Rt LE.    Time  8    Period  Weeks    Status  On-going              Patient will benefit from skilled therapeutic intervention in order to improve the following deficits and impairments:     Visit Diagnosis: Open wound of right ankle, initial encounter  Open wound of right foot, initial encounter     Problem List Patient Active Problem List   Diagnosis Date Noted  . Status post transmetatarsal amputation of right foot ([place])   . Subacute osteomyelitis, right  ankle and foot ([place])   . Infected blister of foot 08/13/2017  . Pressure injury of skin 08/12/2017  . Weakness 08/11/2017  . COPD (chronic obstructive pulmonary disease) ([facility]) 08/11/2017  . Cellulitis of right lower extremity 08/11/2017  . Fall at home, initial encounter 08/11/2017  . Thrombocytopenia ([place]) 08/11/2017  . Weakness generalized 08/11/2017  . Hypokalemia  08/11/2017  . Essential hypertension 08/11/2017   Ihor Austin, [place]; [place]  Aldona Lento 06/30/2018, 3:09 PM  [facility] [address] [place], [place], [address] Phone: [phone]   Fax:  [phone]  Name: Jorge Clements MRN: [id number] Date of Birth: 1945-05-15

## 2018-07-03 ENCOUNTER — Other Ambulatory Visit: Payer: Self-pay

## 2018-07-03 ENCOUNTER — Ambulatory Visit (HOSPITAL_COMMUNITY): Payer: Medicare HMO | Admitting: Physical Therapy

## 2018-07-03 DIAGNOSIS — S91301A Unspecified open wound, right foot, initial encounter: Secondary | ICD-10-CM | POA: Diagnosis not present

## 2018-07-03 DIAGNOSIS — S91001A Unspecified open wound, right ankle, initial encounter: Secondary | ICD-10-CM

## 2018-07-03 NOTE — Therapy (Signed)
[facility] [place], [place], [address] Phone: [phone]   Fax:  [phone]  Wound Care Therapy  Patient Details  Name: Jorge Clements MRN: [id number] Date of Birth: 09-02-1945 Referring Provider (PT): Celene Squibb, MD   Encounter Date: 07/03/2018  PT End of Session - 07/03/18 1500    Visit Number  13    Number of Visits  24    Date for PT Re-Evaluation  07/19/18   Minireassess 06/21/18   Authorization Type  [employer] medicare PPO ( no auth required, no visit limit)    Authorization Time Period  05/24/2018 - 07/21/2018    Authorization - Visit Number  5    Authorization - Number of Visits  10    PT Start Time  3748    PT Stop Time  1420    PT Time Calculation (min)  35 min       Past Medical History:  Diagnosis Date  . COPD (chronic obstructive pulmonary disease) ([facility])     Past Surgical History:  Procedure Laterality Date  . ABDOMINAL AORTOGRAM N/A 08/17/2017   Procedure: ABDOMINAL AORTOGRAM;  Surgeon: Waynetta Sandy, MD;  Location: [facility];  Service: Cardiovascular;  Laterality: N/A;  . AMPUTATION Right 08/21/2017   Procedure: TRANSMETATARSAL AMPUTATION RIGHT FOOT;  Surgeon: Newt Minion, MD;  Location: [facility];  Service: Orthopedics;  Laterality: Right;  . AMPUTATION TOE Right 08/18/2017   Procedure: AMPUTATION RIGHT SECOND AND THIRD TOES;  Surgeon: Waynetta Sandy, MD;  Location: [facility];  Service: Vascular;  Laterality: Right;  . ENDARTERECTOMY FEMORAL Right 08/18/2017   Procedure: ENDARTERECTOMY RIGHT SUPERFICIAL Jerene Canny;  Surgeon: Waynetta Sandy, MD;  Location: [facility];  Service: Vascular;  Laterality: Right;  . FEMORAL-POPLITEAL BYPASS GRAFT Right 08/18/2017   Procedure: BYPASS GRAFT RIGHT COMMON FEMORAL TO ABOVE KNEE POPLITEAL ARTERY USING RIGHT REVERSED GREAT SAPHENOUS VEIN;  Surgeon: Waynetta Sandy, MD;  Location: [facility];  Service: Vascular;  Laterality: Right;  .  LOWER EXTREMITY ANGIOGRAPHY Right 08/17/2017   Procedure: Lower Extremity Angiography;  Surgeon: Waynetta Sandy, MD;  Location: [facility];  Service: Cardiovascular;  Laterality: Right;  . PERIPHERAL VASCULAR BALLOON ANGIOPLASTY Right 08/17/2017   Procedure: PERIPHERAL VASCULAR BALLOON ANGIOPLASTY;  Surgeon: Waynetta Sandy, MD;  Location: [facility];  Service: Cardiovascular;  Laterality: Right;  SFA UNABLE TO CROSS  . VEIN HARVEST Right 08/18/2017   Procedure: VEIN HARVEST RIGHT GREAT SAPHENOUS;  Surgeon: Waynetta Sandy, MD;  Location: [facility];  Service: Vascular;  Laterality: Right;  . WOUND DEBRIDEMENT Right 08/18/2017   Procedure: DEBRIDEMENT WOUND RIGHT FOOT;  Surgeon: Waynetta Sandy, MD;  Location: [facility];  Service: Vascular;  Laterality: Right;    There were no vitals filed for this visit.              Wound Therapy - 07/03/18 1447    Subjective  pt states he's been off antibiotic for a week.  no issues today    Patient and Family Stated Goals  wounds to heal    Date of Onset  --   >1 year ago   Prior Treatments  no self care, prior amputations    Evaluation and Treatment Procedures Explained to Patient/Family  Yes    Evaluation and Treatment Procedures  agreed to    Wound Properties Date First Assessed: 05/26/18 Time First Assessed: 1600 Wound Type: Other (Comment) , opening plantar surface  Location: Foot Location Orientation: Right Present on Admission: Yes   Dressing Type  Silver hydrofiber;Compression wrap   silverhydrofiber with ABD and medipore tape; profore lite   Dressing Status  Old drainage    Dressing Change Frequency  PRN    Site / Wound Assessment  Clean;Dry    % Wound base Red or Granulating  75%   pink/red   % Wound base Yellow/Fibrinous Exudate  10%    % Wound base Other/Granulation Tissue (Comment)  15%   pale tissue   Peri-wound Assessment  Intact    Wound Length (cm)  2.2 cm    Wound Width (cm)  2  cm    Wound Depth (cm)  0.5 cm    Wound Volume (cm^3)  2.2 cm^3    Wound Surface Area (cm^2)  4.4 cm^2    Tunneling (cm)  tunnel not measured this session but was 1.2cm on 3/13    Drainage Amount  Moderate    Drainage Description  Serosanguineous    Treatment  Hydrotherapy (Pulse lavage);Debridement (Selective)    Wound Properties Date First Assessed: 05/24/18 Time First Assessed: 1432 Wound Type: Other (Comment) Location: Ankle Location Orientation: Right;Medial Wound Description (Comments): Diffuse wound at Rt medial ankle, circular and patchy Present on Admission: Yes   Dressing Type  Impregnated gauze (bismuth);Compression wrap    Dressing Changed  Changed    Dressing Status  Old drainage    Dressing Change Frequency  PRN    Site / Wound Assessment  Pink;Red;Yellow    % Wound base Red or Granulating  80%    % Wound base Other/Granulation Tissue (Comment)  --   dry patch   Peri-wound Assessment  Edema;Erythema (non-blanchable);Intact;Hemosiderin    Drainage Amount  None    Treatment  Cleansed    Wound Properties Date First Assessed: 05/24/18 Time First Assessed: 9233 Location: Foot Location Orientation: Right;Anterior;Posterior Wound Description (Comments): Diffuse wound at Metatarsal heads on dorsal and volar foot surface Present on Admission: Yes   Dressing Type  Impregnated gauze (bismuth);Compression wrap   xeroform, profore lite   Dressing Changed  Changed    Dressing Status  Old drainage    Dressing Change Frequency  PRN    Site / Wound Assessment  Dry;Pink;Pale;Yellow    % Wound base Red or Granulating  80%    % Wound base Other/Granulation Tissue (Comment)  20%   dry patch   Peri-wound Assessment  Intact;Edema;Erythema (non-blanchable)    Drainage Amount  None    Treatment  Cleansed    Pulsed lavage therapy - wound location  plantar aspect    Pulsed Lavage with Suction (psi)  4 psi    Pulsed Lavage with Suction - Normal Saline Used  1000 mL    Pulsed Lavage Tip  Tip  with splash shield    Selective Debridement - Location  plantar foot    Selective Debridement - Tools Used  Forceps;Scalpel    Selective Debridement - Tissue Removed  Slough, dried exudated, devitalized tissue, necrotic eschar, callous.    Wound Therapy - Clinical Statement  maceration around superior border of wound.  More brown tinged drainage this session, however no odor or signs of infection.   Continued with pulsed lavage to provide sufficient irrigation of tunned areas. Silver hydro packed into wound and profore lite used.      Wound Therapy - Functional Problem List  Impared walking/mobility, impaired self care/ADL performance    Factors Delaying/Impairing Wound Healing  Altered sensation;Tobacco use;Vascular compromise  Hydrotherapy Plan  Debridement;Dressing change;Patient/family education;Pulsatile lavage with suction    Wound Therapy - Frequency  3X / week    Wound Therapy - Current Recommendations  PT    Wound Plan  Measure Mondays. Conitnued with profore lite for edema mangement. Conitnue with topical agents and debridement as appropriate for wound healing stage.    Dressing   Rt Medial ankle: xeroform, profore lite    Dressing  Foot wound: xeroform and profore lite                PT Short Term Goals - 06/21/18 1441      PT SHORT TERM GOAL #1   Title  Patient will have no callous present at base of Rt foot to indicate improve WB tolerance and self care.    Baseline  no callous but wound present    Time  4    Period  Weeks    Status  Partially Met    Target Date  06/21/18      PT SHORT TERM GOAL #2   Title  Wound will reduce by 50% surface area to indicate wound healing and progress towards proliferative stage of healing away from inflammatory stage.    Baseline  achieved for ant foot and medial ankle wounds    Time  4    Period  Weeks    Status  Partially Met        PT Long Term Goals - 06/21/18 1442      PT LONG TERM GOAL #1   Title  Rt medial ankle  wound will be healing with 90% reducation in surface area indicating goof approximation of wound with no evidence of imparied periwound integrity.    Time  8    Period  Weeks    Status  On-going      PT LONG TERM GOAL #2   Title  Rt foot wound will be healing with 90% reducation in surface area indicating goof approximation of wound with no evidence of imparied periwound integrity.    Time  8    Period  Weeks    Status  On-going      PT LONG TERM GOAL #3   Title  Patient will have 75% reduced edema and if appropriate per vascular MD (Possible Dr. Donzetta Matters) patient will be independent in donnig/doffing and carign for compresison garment to manage edema of Rt LE.    Time  8    Period  Weeks    Status  On-going              Patient will benefit from skilled therapeutic intervention in order to improve the following deficits and impairments:     Visit Diagnosis: Open wound of right ankle, initial encounter  Open wound of right foot, initial encounter     Problem List Patient Active Problem List   Diagnosis Date Noted  . Status post transmetatarsal amputation of right foot ([place])   . Subacute osteomyelitis, right ankle and foot ([place])   . Infected blister of foot 08/13/2017  . Pressure injury of skin 08/12/2017  . Weakness 08/11/2017  . COPD (chronic obstructive pulmonary disease) ([place]) 08/11/2017  . Cellulitis of right lower extremity 08/11/2017  . Fall at home, initial encounter 08/11/2017  . Thrombocytopenia ([place]) 08/11/2017  . Weakness generalized 08/11/2017  . Hypokalemia 08/11/2017  . Essential hypertension 08/11/2017   Teena Irani, PTA/CLT [phone]  Teena Irani 07/03/2018, 3:02 PM  [facility] [place], [place], [address] Phone: [phone]   Fax:  [phone]  Name: Jorge Clements MRN: [id number] Date of Birth: 1946-01-31

## 2018-07-05 ENCOUNTER — Ambulatory Visit (HOSPITAL_COMMUNITY): Payer: Medicare HMO

## 2018-07-05 ENCOUNTER — Encounter (HOSPITAL_COMMUNITY): Payer: Self-pay

## 2018-07-05 ENCOUNTER — Other Ambulatory Visit: Payer: Self-pay

## 2018-07-05 DIAGNOSIS — S91301A Unspecified open wound, right foot, initial encounter: Secondary | ICD-10-CM | POA: Diagnosis not present

## 2018-07-05 DIAGNOSIS — S91001A Unspecified open wound, right ankle, initial encounter: Secondary | ICD-10-CM | POA: Diagnosis not present

## 2018-07-05 NOTE — Therapy (Signed)
[place], [place], [address] Phone: [phone]   Fax:  [phone]  Wound Care Therapy  Patient Details  Name: Jorge Clements MRN: [id number] Date of Birth: 11-15-1945 Referring Provider (PT): Celene Squibb, MD   Encounter Date: 07/05/2018  PT End of Session - 07/05/18 1440    Visit Number  14    Number of Visits  24    Date for PT Re-Evaluation  07/19/18   Minireassess 06/21/18   Authorization Type  [employer] medicare PPO ( no auth required, no visit limit)    Authorization Time Period  05/24/2018 - 07/21/2018    Authorization - Visit Number  6    Authorization - Number of Visits  10    PT Start Time  7939    PT Stop Time  1432    PT Time Calculation (min)  39 min    Activity Tolerance  Patient tolerated treatment well    Behavior During Therapy  [facility] for tasks assessed/performed       Past Medical History:  Diagnosis Date  . COPD (chronic obstructive pulmonary disease) ([place])     Past Surgical History:  Procedure Laterality Date  . ABDOMINAL AORTOGRAM N/A 08/17/2017   Procedure: ABDOMINAL AORTOGRAM;  Surgeon: Waynetta Sandy, MD;  Location: [place] [facility];  Service: Cardiovascular;  Laterality: N/A;  . AMPUTATION Right 08/21/2017   Procedure: TRANSMETATARSAL AMPUTATION RIGHT FOOT;  Surgeon: Newt Minion, MD;  Location: [place];  Service: Orthopedics;  Laterality: Right;  . AMPUTATION TOE Right 08/18/2017   Procedure: AMPUTATION RIGHT SECOND AND THIRD TOES;  Surgeon: Waynetta Sandy, MD;  Location: [place];  Service: Vascular;  Laterality: Right;  . ENDARTERECTOMY FEMORAL Right 08/18/2017   Procedure: ENDARTERECTOMY RIGHT SUPERFICIAL Jerene Canny;  Surgeon: Waynetta Sandy, MD;  Location: [place];  Service: Vascular;  Laterality: Right;  . FEMORAL-POPLITEAL BYPASS GRAFT Right 08/18/2017   Procedure: BYPASS GRAFT RIGHT COMMON FEMORAL TO ABOVE KNEE POPLITEAL ARTERY USING RIGHT REVERSED GREAT  SAPHENOUS VEIN;  Surgeon: Waynetta Sandy, MD;  Location: [place];  Service: Vascular;  Laterality: Right;  . LOWER EXTREMITY ANGIOGRAPHY Right 08/17/2017   Procedure: Lower Extremity Angiography;  Surgeon: Waynetta Sandy, MD;  Location: [place] [facility];  Service: Cardiovascular;  Laterality: Right;  . PERIPHERAL VASCULAR BALLOON ANGIOPLASTY Right 08/17/2017   Procedure: PERIPHERAL VASCULAR BALLOON ANGIOPLASTY;  Surgeon: Waynetta Sandy, MD;  Location: [place] [facility];  Service: Cardiovascular;  Laterality: Right;  SFA UNABLE TO CROSS  . VEIN HARVEST Right 08/18/2017   Procedure: VEIN HARVEST RIGHT GREAT SAPHENOUS;  Surgeon: Waynetta Sandy, MD;  Location: [place];  Service: Vascular;  Laterality: Right;  . WOUND DEBRIDEMENT Right 08/18/2017   Procedure: DEBRIDEMENT WOUND RIGHT FOOT;  Surgeon: Waynetta Sandy, MD;  Location: [place];  Service: Vascular;  Laterality: Right;    There were no vitals filed for this visit.   Subjective Assessment - 07/05/18 1544    Subjective  No issues, arrived wiht dressings intact                Wound Therapy - 07/05/18 1544    Subjective  No issues, arrived wiht dressings intact    Patient and Family Stated Goals  wounds to heal    Date of Onset  --   >1 year ago   Prior Treatments  no self care, prior amputations    Pain Scale  0-10  Pain Score  0-No pain    Evaluation and Treatment Procedures Explained to Patient/Family  Yes    Evaluation and Treatment Procedures  agreed to    Wound Properties Date First Assessed: 05/26/18 Time First Assessed: 1600 Wound Type: Other (Comment) , opening plantar surface  Location: Foot Location Orientation: Right Present on Admission: Yes   Dressing Type  Silver hydrofiber;Compression wrap   silverhydrofiber, profore lite   Dressing Changed  Changed    Dressing Status  Old drainage    Dressing Change Frequency  PRN    Site / Wound Assessment  Clean;Dry    %  Wound base Red or Granulating  75%   pink/red   % Wound base Yellow/Fibrinous Exudate  10%    % Wound base Other/Granulation Tissue (Comment)  15%   pale tissue   Peri-wound Assessment  Intact    Tunneling (cm)  tunnel from 12-2:00 at .9cm    Drainage Amount  Moderate    Drainage Description  Serosanguineous    Treatment  Cleansed;Debridement (Selective);Hydrotherapy (Pulse lavage)    Wound Properties Date First Assessed: 05/24/18 Time First Assessed: 1432 Wound Type: Other (Comment) Location: Ankle Location Orientation: Right;Medial Wound Description (Comments): Diffuse wound at Rt medial ankle, circular and patchy Present on Admission: Yes   Dressing Type  Impregnated gauze (bismuth);Compression wrap   xeroform, profore lite   Dressing Changed  Changed    Dressing Status  Old drainage    Dressing Change Frequency  PRN    Site / Wound Assessment  Pink;Red;Yellow    % Wound base Red or Granulating  80%    % Wound base Other/Granulation Tissue (Comment)  20%   dry patch   Peri-wound Assessment  Edema;Erythema (non-blanchable);Intact;Hemosiderin    Drainage Amount  None    Treatment  Cleansed    Wound Properties Date First Assessed: 05/24/18 Time First Assessed: 8786 Location: Foot Location Orientation: Right;Anterior;Posterior Wound Description (Comments): Diffuse wound at Metatarsal heads on dorsal and volar foot surface Present on Admission: Yes   Dressing Type  Impregnated gauze (bismuth);Compression wrap   xeroform, profore lite   Dressing Changed  Changed    Dressing Status  Old drainage    Dressing Change Frequency  PRN    Site / Wound Assessment  Dry;Pink;Pale;Yellow    % Wound base Red or Granulating  80%    % Wound base Other/Granulation Tissue (Comment)  20%   dry patch   Peri-wound Assessment  Intact;Edema;Erythema (non-blanchable)    Drainage Amount  None    Treatment  Cleansed    Pulsed lavage therapy - wound location  plantar aspect    Pulsed Lavage with Suction  (psi)  4 psi    Pulsed Lavage with Suction - Normal Saline Used  1000 mL    Pulsed Lavage Tip  Tip with splash shield    Selective Debridement - Location  plantar foot    Selective Debridement - Tools Used  Forceps;Scalpel    Selective Debridement - Tissue Removed  Slough, dried exudated, devitalized tissue, necrotic eschar, callous.    Wound Therapy - Clinical Statement  Maceration inferior border upon dressings removal.  Selective debridement for removal of dry/dead skin perimeter of wound to promote healing.  Decreased overall drainage that has a tint of brown.  Continued with silverhydrofiber and profore lite for edema control.      Wound Therapy - Functional Problem List  Impared walking/mobility, impaired self care/ADL performance    Factors Delaying/Impairing Wound Healing  Altered sensation;Tobacco  use;Vascular compromise    Hydrotherapy Plan  Debridement;Dressing change;Patient/family education;Pulsatile lavage with suction    Wound Therapy - Frequency  3X / week    Wound Therapy - Current Recommendations  PT    Wound Plan  Measure Mondays. Conitnued with profore lite for edema mangement. Conitnue with topical agents and debridement as appropriate for wound healing stage.    Dressing   Rt Medial ankle: xeroform, profore lite    Dressing  Foot wound: xeroform and profore lite    Dressing  Plantar aspect: silverhydrofiber and profore lite                PT Short Term Goals - 06/21/18 1441      PT SHORT TERM GOAL #1   Title  Patient will have no callous present at base of Rt foot to indicate improve WB tolerance and self care.    Baseline  no callous but wound present    Time  4    Period  Weeks    Status  Partially Met    Target Date  06/21/18      PT SHORT TERM GOAL #2   Title  Wound will reduce by 50% surface area to indicate wound healing and progress towards proliferative stage of healing away from inflammatory stage.    Baseline  achieved for ant foot and medial  ankle wounds    Time  4    Period  Weeks    Status  Partially Met        PT Long Term Goals - 06/21/18 1442      PT LONG TERM GOAL #1   Title  Rt medial ankle wound will be healing with 90% reducation in surface area indicating goof approximation of wound with no evidence of imparied periwound integrity.    Time  8    Period  Weeks    Status  On-going      PT LONG TERM GOAL #2   Title  Rt foot wound will be healing with 90% reducation in surface area indicating goof approximation of wound with no evidence of imparied periwound integrity.    Time  8    Period  Weeks    Status  On-going      PT LONG TERM GOAL #3   Title  Patient will have 75% reduced edema and if appropriate per vascular MD (Possible Dr. Donzetta Matters) patient will be independent in donnig/doffing and carign for compresison garment to manage edema of Rt LE.    Time  8    Period  Weeks    Status  On-going              Patient will benefit from skilled therapeutic intervention in order to improve the following deficits and impairments:     Visit Diagnosis: Open wound of right ankle, initial encounter  Open wound of right foot, initial encounter     Problem List Patient Active Problem List   Diagnosis Date Noted  . Status post transmetatarsal amputation of right foot ([place])   . Subacute osteomyelitis, right ankle and foot ([place])   . Infected blister of foot 08/13/2017  . Pressure injury of skin 08/12/2017  . Weakness 08/11/2017  . COPD (chronic obstructive pulmonary disease) ([place]) 08/11/2017  . Cellulitis of right lower extremity 08/11/2017  . Fall at home, initial encounter 08/11/2017  . Thrombocytopenia ([place]) 08/11/2017  . Weakness generalized 08/11/2017  . Hypokalemia 08/11/2017  . Essential hypertension 08/11/2017   Ihor Austin, LPTA;  CBIS [phone]  Aldona Lento 07/05/2018, 3:52 PM  [facility] [address] [place], [place],  [address] Phone: [phone]   Fax:  [phone]  Name: Aidden Markovic MRN: [id number] Date of Birth: 1946/02/18

## 2018-07-07 ENCOUNTER — Ambulatory Visit (HOSPITAL_COMMUNITY): Payer: Medicare HMO

## 2018-07-07 ENCOUNTER — Encounter (HOSPITAL_COMMUNITY): Payer: Self-pay

## 2018-07-07 ENCOUNTER — Other Ambulatory Visit: Payer: Self-pay

## 2018-07-07 DIAGNOSIS — S91301A Unspecified open wound, right foot, initial encounter: Secondary | ICD-10-CM | POA: Diagnosis not present

## 2018-07-07 DIAGNOSIS — S91001A Unspecified open wound, right ankle, initial encounter: Secondary | ICD-10-CM

## 2018-07-07 NOTE — Therapy (Signed)
[place], [place], [address] Phone: [phone]   Fax:  [phone]  Wound Care Therapy  Patient Details  Name: Jorge Clements MRN: [id number] Date of Birth: 1945/06/21 Referring Provider (PT): Celene Squibb, MD   Encounter Date: 07/07/2018  PT End of Session - 07/07/18 1507    Visit Number  15    Number of Visits  24    Date for PT Re-Evaluation  07/19/18   Minireassess 06/21/18   Authorization Type  [employer] medicare PPO ( no auth required, no visit limit)    Authorization Time Period  05/24/2018 - 07/21/2018    Authorization - Visit Number  7    Authorization - Number of Visits  10    PT Start Time  8832    PT Stop Time  1428    PT Time Calculation (min)  43 min       Past Medical History:  Diagnosis Date  . COPD (chronic obstructive pulmonary disease) ([place])     Past Surgical History:  Procedure Laterality Date  . ABDOMINAL AORTOGRAM N/A 08/17/2017   Procedure: ABDOMINAL AORTOGRAM;  Surgeon: Waynetta Sandy, MD;  Location: [place] [facility];  Service: Cardiovascular;  Laterality: N/A;  . AMPUTATION Right 08/21/2017   Procedure: TRANSMETATARSAL AMPUTATION RIGHT FOOT;  Surgeon: Newt Minion, MD;  Location: [place];  Service: Orthopedics;  Laterality: Right;  . AMPUTATION TOE Right 08/18/2017   Procedure: AMPUTATION RIGHT SECOND AND THIRD TOES;  Surgeon: Waynetta Sandy, MD;  Location: [place];  Service: Vascular;  Laterality: Right;  . ENDARTERECTOMY FEMORAL Right 08/18/2017   Procedure: ENDARTERECTOMY RIGHT SUPERFICIAL Jerene Canny;  Surgeon: Waynetta Sandy, MD;  Location: [place];  Service: Vascular;  Laterality: Right;  . FEMORAL-POPLITEAL BYPASS GRAFT Right 08/18/2017   Procedure: BYPASS GRAFT RIGHT COMMON FEMORAL TO ABOVE KNEE POPLITEAL ARTERY USING RIGHT REVERSED GREAT SAPHENOUS VEIN;  Surgeon: Waynetta Sandy, MD;  Location: [place];  Service: Vascular;  Laterality: Right;  .  LOWER EXTREMITY ANGIOGRAPHY Right 08/17/2017   Procedure: Lower Extremity Angiography;  Surgeon: Waynetta Sandy, MD;  Location: [place] [facility];  Service: Cardiovascular;  Laterality: Right;  . PERIPHERAL VASCULAR BALLOON ANGIOPLASTY Right 08/17/2017   Procedure: PERIPHERAL VASCULAR BALLOON ANGIOPLASTY;  Surgeon: Waynetta Sandy, MD;  Location: [place] [facility];  Service: Cardiovascular;  Laterality: Right;  SFA UNABLE TO CROSS  . VEIN HARVEST Right 08/18/2017   Procedure: VEIN HARVEST RIGHT GREAT SAPHENOUS;  Surgeon: Waynetta Sandy, MD;  Location: [place];  Service: Vascular;  Laterality: Right;  . WOUND DEBRIDEMENT Right 08/18/2017   Procedure: DEBRIDEMENT WOUND RIGHT FOOT;  Surgeon: Waynetta Sandy, MD;  Location: [place];  Service: Vascular;  Laterality: Right;    There were no vitals filed for this visit.   Subjective Assessment - 07/07/18 1500    Subjective  No issues, arrived wiht dressings intact                Wound Therapy - 07/07/18 1500    Subjective  No issues, arrived wiht dressings intact    Patient and Family Stated Goals  wounds to heal    Date of Onset  --   >1 year ago   Prior Treatments  no self care, prior amputations    Pain Scale  0-10    Pain Score  0-No pain    Evaluation and Treatment Procedures Explained to Patient/Family  Yes  Evaluation and Treatment Procedures  agreed to    Wound Properties Date First Assessed: 05/26/18 Time First Assessed: 1600 Wound Type: Other (Comment) , opening plantar surface  Location: Foot Location Orientation: Right Present on Admission: Yes   Dressing Type  Silver hydrofiber;Compression wrap   silverhydrofiber, ABD, profore lite   Dressing Changed  Changed    Dressing Status  Old drainage    Dressing Change Frequency  PRN    Site / Wound Assessment  Clean;Dry    % Wound base Red or Granulating  75%   pink/red   % Wound base Yellow/Fibrinous Exudate  10%   light brown in center  of wound bed   % Wound base Other/Granulation Tissue (Comment)  15%   pale tissue   Tunneling (cm)  tunnel from 12-2:00 at .8cm    Drainage Amount  Moderate    Drainage Description  Serosanguineous    Treatment  Cleansed;Debridement (Selective)    Wound Properties Date First Assessed: 05/24/18 Time First Assessed: 1432 Wound Type: Other (Comment) Location: Ankle Location Orientation: Right;Medial Wound Description (Comments): Diffuse wound at Rt medial ankle, circular and patchy Present on Admission: Yes   Dressing Type  Impregnated gauze (bismuth);Compression wrap   xeroform, profore lite   Dressing Changed  Changed    Dressing Status  Old drainage    Dressing Change Frequency  PRN    Site / Wound Assessment  Pink;Red;Yellow    % Wound base Red or Granulating  80%    % Wound base Other/Granulation Tissue (Comment)  20%   dry patch   Peri-wound Assessment  Edema;Erythema (non-blanchable);Intact;Hemosiderin    Drainage Amount  None    Treatment  Cleansed    Wound Properties Date First Assessed: 05/24/18 Time First Assessed: 8182 Location: Foot Location Orientation: Right;Anterior;Posterior Wound Description (Comments): Diffuse wound at Metatarsal heads on dorsal and volar foot surface Present on Admission: Yes   Dressing Type  Impregnated gauze (bismuth);Compression wrap   xeroform, profore lite   Dressing Changed  Changed    Dressing Status  Old drainage    Dressing Change Frequency  PRN    Site / Wound Assessment  Dry;Pink;Pale;Yellow    % Wound base Red or Granulating  80%    % Wound base Other/Granulation Tissue (Comment)  20%   dry patch   Peri-wound Assessment  Intact;Edema;Erythema (non-blanchable)    Drainage Amount  None    Treatment  Cleansed    Pulsed lavage therapy - wound location  plantar aspect    Pulsed Lavage with Suction (psi)  4 psi    Pulsed Lavage with Suction - Normal Saline Used  1000 mL    Pulsed Lavage Tip  Tip with splash shield    Selective Debridement -  Location  plantar foot    Selective Debridement - Tools Used  Forceps;Scalpel    Selective Debridement - Tissue Removed  Slough, dried exudated, devitalized tissue, necrotic eschar, callous.    Wound Therapy - Clinical Statement  Improved redness in wound bed perimeter, continues to have light brown tint in middle of wound bed.  Continues PLS and selective debridement for removal of callous/dry skin perimeter and slough removed from wound bed.  Continued wiht silverhydrofiber packed into wound with medipore tape applied to address wound bed extending out of bed and profore lite for edema control.     Wound Therapy - Functional Problem List  Impared walking/mobility, impaired self care/ADL performance    Factors Delaying/Impairing Wound Healing  Altered sensation;Tobacco use;Vascular  compromise    Hydrotherapy Plan  Debridement;Dressing change;Patient/family education;Pulsatile lavage with suction    Wound Therapy - Frequency  3X / week    Wound Therapy - Current Recommendations  PT    Wound Plan  Measure Mondays. Conitnued with profore lite for edema mangement. Conitnue with topical agents and debridement as appropriate for wound healing stage.    Dressing   Rt Medial ankle: xeroform, profore lite    Dressing  Foot wound: xeroform and profore lite    Dressing  Plantar aspect: silverhydrofiber, ABD profore lite                PT Short Term Goals - 06/21/18 1441      PT SHORT TERM GOAL #1   Title  Patient will have no callous present at base of Rt foot to indicate improve WB tolerance and self care.    Baseline  no callous but wound present    Time  4    Period  Weeks    Status  Partially Met    Target Date  06/21/18      PT SHORT TERM GOAL #2   Title  Wound will reduce by 50% surface area to indicate wound healing and progress towards proliferative stage of healing away from inflammatory stage.    Baseline  achieved for ant foot and medial ankle wounds    Time  4    Period   Weeks    Status  Partially Met        PT Long Term Goals - 06/21/18 1442      PT LONG TERM GOAL #1   Title  Rt medial ankle wound will be healing with 90% reducation in surface area indicating goof approximation of wound with no evidence of imparied periwound integrity.    Time  8    Period  Weeks    Status  On-going      PT LONG TERM GOAL #2   Title  Rt foot wound will be healing with 90% reducation in surface area indicating goof approximation of wound with no evidence of imparied periwound integrity.    Time  8    Period  Weeks    Status  On-going      PT LONG TERM GOAL #3   Title  Patient will have 75% reduced edema and if appropriate per vascular MD (Possible Dr. Donzetta Matters) patient will be independent in donnig/doffing and carign for compresison garment to manage edema of Rt LE.    Time  8    Period  Weeks    Status  On-going              Patient will benefit from skilled therapeutic intervention in order to improve the following deficits and impairments:     Visit Diagnosis: Open wound of right ankle, initial encounter  Open wound of right foot, initial encounter     Problem List Patient Active Problem List   Diagnosis Date Noted  . Status post transmetatarsal amputation of right foot ([place])   . Subacute osteomyelitis, right ankle and foot ([place])   . Infected blister of foot 08/13/2017  . Pressure injury of skin 08/12/2017  . Weakness 08/11/2017  . COPD (chronic obstructive pulmonary disease) ([place]) 08/11/2017  . Cellulitis of right lower extremity 08/11/2017  . Fall at home, initial encounter 08/11/2017  . Thrombocytopenia ([place]) 08/11/2017  . Weakness generalized 08/11/2017  . Hypokalemia 08/11/2017  . Essential hypertension 08/11/2017   Ihor Austin, LPTA; CBIS  [phone]  Aldona Lento 07/07/2018, 3:08 PM  [facility] [address] [place], [place], [address] Phone: [phone]   Fax:   [phone]  Name: Antion Andres MRN: [id number] Date of Birth: 08-10-1945

## 2018-07-10 ENCOUNTER — Encounter (HOSPITAL_COMMUNITY): Payer: Self-pay

## 2018-07-10 ENCOUNTER — Ambulatory Visit (HOSPITAL_COMMUNITY): Payer: Medicare HMO

## 2018-07-10 ENCOUNTER — Other Ambulatory Visit: Payer: Self-pay

## 2018-07-10 DIAGNOSIS — S91001A Unspecified open wound, right ankle, initial encounter: Secondary | ICD-10-CM | POA: Diagnosis not present

## 2018-07-10 DIAGNOSIS — S91301A Unspecified open wound, right foot, initial encounter: Secondary | ICD-10-CM | POA: Diagnosis not present

## 2018-07-10 NOTE — Therapy (Signed)
[facility] [address] [place], [place], [address] Phone: [phone]   Fax:  [phone]  Wound Care Therapy/Progress Note  Patient Details  Name: Jorge Clements MRN: [id number] Date of Birth: 05-28-1945 Referring Provider (PT): Celene Squibb, MD   Encounter Date: 07/10/2018   Progress Note Reporting Period 06/21/18 to 07/10/18  See note below for Objective Data and Assessment of Progress/Goals.    PT End of Session - 07/10/18 1141    Visit Number  16    Number of Visits  24    Date for PT Re-Evaluation  07/19/18   Minireassess 06/21/18   Authorization Type  [employer] medicare PPO ( no auth required, no visit limit)    Authorization Time Period  05/24/2018 - 07/21/2018    Authorization - Visit Number  1    Authorization - Number of Visits  10    PT Start Time  1034    PT Stop Time  1113    PT Time Calculation (min)  39 min    Activity Tolerance  Patient tolerated treatment well    Behavior During Therapy  WFL for tasks assessed/performed       Past Medical History:  Diagnosis Date  . COPD (chronic obstructive pulmonary disease) ([place])     Past Surgical History:  Procedure Laterality Date  . ABDOMINAL AORTOGRAM N/A 08/17/2017   Procedure: ABDOMINAL AORTOGRAM;  Surgeon: Waynetta Sandy, MD;  Location: [facility];  Service: Cardiovascular;  Laterality: N/A;  . AMPUTATION Right 08/21/2017   Procedure: TRANSMETATARSAL AMPUTATION RIGHT FOOT;  Surgeon: Newt Minion, MD;  Location: [facility];  Service: Orthopedics;  Laterality: Right;  . AMPUTATION TOE Right 08/18/2017   Procedure: AMPUTATION RIGHT SECOND AND THIRD TOES;  Surgeon: Waynetta Sandy, MD;  Location: [facility];  Service: Vascular;  Laterality: Right;  . ENDARTERECTOMY FEMORAL Right 08/18/2017   Procedure: ENDARTERECTOMY RIGHT SUPERFICIAL Jerene Canny;  Surgeon: Waynetta Sandy, MD;  Location: [facility];  Service: Vascular;  Laterality: Right;  .  FEMORAL-POPLITEAL BYPASS GRAFT Right 08/18/2017   Procedure: BYPASS GRAFT RIGHT COMMON FEMORAL TO ABOVE KNEE POPLITEAL ARTERY USING RIGHT REVERSED GREAT SAPHENOUS VEIN;  Surgeon: Waynetta Sandy, MD;  Location: [facility];  Service: Vascular;  Laterality: Right;  . LOWER EXTREMITY ANGIOGRAPHY Right 08/17/2017   Procedure: Lower Extremity Angiography;  Surgeon: Waynetta Sandy, MD;  Location: [facility];  Service: Cardiovascular;  Laterality: Right;  . PERIPHERAL VASCULAR BALLOON ANGIOPLASTY Right 08/17/2017   Procedure: PERIPHERAL VASCULAR BALLOON ANGIOPLASTY;  Surgeon: Waynetta Sandy, MD;  Location: [facility];  Service: Cardiovascular;  Laterality: Right;  SFA UNABLE TO CROSS  . VEIN HARVEST Right 08/18/2017   Procedure: VEIN HARVEST RIGHT GREAT SAPHENOUS;  Surgeon: Waynetta Sandy, MD;  Location: [facility];  Service: Vascular;  Laterality: Right;  . WOUND DEBRIDEMENT Right 08/18/2017   Procedure: DEBRIDEMENT WOUND RIGHT FOOT;  Surgeon: Waynetta Sandy, MD;  Location: [facility];  Service: Vascular;  Laterality: Right;    There were no vitals filed for this visit.     Wound Therapy - 07/10/18 1126    Subjective  Patient arrives with dressing in place and denies issues or pain.     Patient and Family Stated Goals  wounds to heal    Date of Onset  --   >1 year ago   Prior Treatments  no self care, prior amputations    Pain Scale  0-10  Pain Score  0-No pain    Evaluation and Treatment Procedures Explained to Patient/Family  Yes    Evaluation and Treatment Procedures  agreed to    Wound Properties Date First Assessed: 05/26/18 Time First Assessed: 1600 Wound Type: Other (Comment) , opening plantar surface  Location: Foot Location Orientation: Right Present on Admission: Yes   Dressing Type  Silver hydrofiber;Compression wrap   silverhydrofiber, ABD, profore lite   Dressing Changed  Changed    Dressing Status  Old drainage    Dressing Change  Frequency  PRN    Site / Wound Assessment  Clean;Dry    % Wound base Red or Granulating  85%   red   % Wound base Yellow/Fibrinous Exudate  5%   in center of wound bed   % Wound base Other/Granulation Tissue (Comment)  10%   pale tissue   Peri-wound Assessment  Intact    Wound Length (cm)  2.3 cm   2.2   Wound Width (cm)  2.4 cm   2   Wound Depth (cm)  0.4 cm   0.5   Wound Volume (cm^3)  2.21 cm^3    Wound Surface Area (cm^2)  5.52 cm^2    Tunneling (cm)  tunnel is no longer present    Undermining (cm)  in center of wound bed flap of red tissue has undermining to base of wound bed (0.5 cm)    Drainage Amount  Minimal    Drainage Description  Serosanguineous    Treatment  Cleansed;Debridement (Selective);Hydrotherapy (Pulse lavage)    Wound Properties Date First Assessed: 05/24/18 Time First Assessed: 1432 Wound Type: Other (Comment) Location: Ankle Location Orientation: Right;Medial Wound Description (Comments): Diffuse wound at Rt medial ankle, circular and patchy Present on Admission: Yes   Dressing Type  Impregnated gauze (bismuth);Compression wrap   xeroform, profore lite   Dressing Changed  Changed    Dressing Status  Old drainage    Dressing Change Frequency  PRN    Site / Wound Assessment  Pink;Red;Yellow    % Wound base Red or Granulating  --   small patches of openings; intact skin surrounding   % Wound base Other/Granulation Tissue (Comment)  --    Peri-wound Assessment  Hemosiderin;Intact    Wound Length (cm)  0 cm    Wound Width (cm)  0 cm    Wound Depth (cm)  0 cm    Wound Volume (cm^3)  0 cm^3    Wound Surface Area (cm^2)  0 cm^2    Drainage Amount  None    Treatment  Cleansed    Wound Properties Date First Assessed: 05/24/18 Time First Assessed: 0017 Location: Foot Location Orientation: Right;Anterior;Posterior Wound Description (Comments): Diffuse wound at Metatarsal heads on dorsal and volar foot surface Present on Admission: Yes   Dressing Type  Impregnated  gauze (bismuth);Compression wrap   xeroform, profore lite   Dressing Changed  Changed    Dressing Status  Old drainage    Dressing Change Frequency  PRN    Site / Wound Assessment  Dry;Pink;Pale;Yellow    % Wound base Red or Granulating  --   small patches of openings; intact skin surrounding   % Wound base Other/Granulation Tissue (Comment)  --    Peri-wound Assessment  Intact;Hemosiderin    Wound Length (cm)  0 cm    Wound Width (cm)  0 cm    Wound Depth (cm)  0 cm    Wound Volume (cm^3)  0 cm^3  Wound Surface Area (cm^2)  0 cm^2    Drainage Amount  None    Treatment  Cleansed    Pulsed lavage therapy - wound location  plantar aspect    Pulsed Lavage with Suction (psi)  4 psi    Pulsed Lavage with Suction - Normal Saline Used  1000 mL    Pulsed Lavage Tip  Tip with splash shield    Selective Debridement - Location  plantar foot    Selective Debridement - Tools Used  Forceps;Scissors    Selective Debridement - Tissue Removed  Slough, dried exudated, devitalized tissue, callous.    Wound Therapy - Clinical Statement  Wound presents with much improve granulation and red tissue in 85% of wound bed with area of pale tissue in center of wound bed as well. Slough/biofilm easily debrided. Tunnel at superior/medial aspect of wound bed has filled in and a small flap of tissue is in middle of wound bed has some undermining/is lifted up on the lateral side of the wound. It extend 0.5 cm under the flap to the base of the wound bed. Continued with pulsed lavage to clean under flap and selective debridement to margins to remove callous. Continued with silver hydrofiber to wound bed and ABD pad with tape to increase pressure to wound bed as the tissue is extending beyond skin margins slightly. Continued with profore lite for edema control.    Wound Therapy - Functional Problem List  Impared walking/mobility, impaired self care/ADL performance    Factors Delaying/Impairing Wound Healing  Altered  sensation;Tobacco use;Vascular compromise    Hydrotherapy Plan  Debridement;Dressing change;Patient/family education;Pulsatile lavage with suction    Wound Therapy - Frequency  3X / week    Wound Therapy - Current Recommendations  PT    Wound Plan  Measure Mondays. Continue with profore lite for edema management. Continue with topical agents and debridement as appropriate for wound healing stage.    Dressing   --    Dressing  --    Dressing  Plantar foot wound: sliver hydrofiber, ABD, profore lite         PT Short Term Goals - 06/21/18 1441      PT SHORT TERM GOAL #1   Title  Patient will have no callous present at base of Rt foot to indicate improve WB tolerance and self care.    Baseline  no callous but wound present    Time  4    Period  Weeks    Status  Partially Met    Target Date  06/21/18      PT SHORT TERM GOAL #2   Title  Wound will reduce by 50% surface area to indicate wound healing and progress towards proliferative stage of healing away from inflammatory stage.    Baseline  achieved for ant foot and medial ankle wounds    Time  4    Period  Weeks    Status  Partially Met        PT Long Term Goals - 06/21/18 1442      PT LONG TERM GOAL #1   Title  Rt medial ankle wound will be healing with 90% reducation in surface area indicating goof approximation of wound with no evidence of imparied periwound integrity.    Time  8    Period  Weeks    Status  On-going      PT LONG TERM GOAL #2   Title  Rt foot wound will be healing with 90% reducation  in surface area indicating goof approximation of wound with no evidence of imparied periwound integrity.    Time  8    Period  Weeks    Status  On-going      PT LONG TERM GOAL #3   Title  Patient will have 75% reduced edema and if appropriate per vascular MD (Possible Dr. Donzetta Matters) patient will be independent in donnig/doffing and carign for compresison garment to manage edema of Rt LE.    Time  8    Period  Weeks    Status   On-going         Plan - 07/10/18 1142    Clinical Impression Statement  see above    Rehab Potential  Fair    Clinical Impairments Affecting Rehab Potential  (-) chronic woudn history, (-) decreaed self care motivation    PT Frequency  3x / week    PT Treatment/Interventions  ADLs/Self Care Home Management;Compression bandaging;Manual techniques;Patient/family education   skilled wound care, selective debridment, compression wrapping   PT Next Visit Plan  see above    Consulted and Agree with Plan of Care  Patient       Patient will benefit from skilled therapeutic intervention in order to improve the following deficits and impairments:  Abnormal gait, Decreased balance, Decreased skin integrity, Increased edema, Impaired sensation, Decreased mobility, Difficulty walking  Visit Diagnosis: Open wound of right ankle, initial encounter  Open wound of right foot, initial encounter     Problem List Patient Active Problem List   Diagnosis Date Noted  . Status post transmetatarsal amputation of right foot ([place])   . Subacute osteomyelitis, right ankle and foot ([place])   . Infected blister of foot 08/13/2017  . Pressure injury of skin 08/12/2017  . Weakness 08/11/2017  . COPD (chronic obstructive pulmonary disease) ([place]) 08/11/2017  . Cellulitis of right lower extremity 08/11/2017  . Fall at home, initial encounter 08/11/2017  . Thrombocytopenia ([place]) 08/11/2017  . Weakness generalized 08/11/2017  . Hypokalemia 08/11/2017  . Essential hypertension 08/11/2017    Kipp Brood, PT, DPT, [facility] Physical Therapist with [facility]  07/10/2018 11:43 AM    [facility] [address] [place], [place], [address] Phone: [phone]   Fax:  [phone]  Name: Jorge Clements MRN: [id number] Date of Birth: [birth year]

## 2018-07-12 ENCOUNTER — Other Ambulatory Visit: Payer: Self-pay

## 2018-07-12 ENCOUNTER — Ambulatory Visit (HOSPITAL_COMMUNITY): Payer: Medicare HMO

## 2018-07-12 ENCOUNTER — Encounter (HOSPITAL_COMMUNITY): Payer: Self-pay

## 2018-07-12 DIAGNOSIS — S91301A Unspecified open wound, right foot, initial encounter: Secondary | ICD-10-CM

## 2018-07-12 DIAGNOSIS — S91001A Unspecified open wound, right ankle, initial encounter: Secondary | ICD-10-CM | POA: Diagnosis not present

## 2018-07-12 NOTE — Therapy (Signed)
[facility] [place], [place], [address] Phone: [phone]   Fax:  [phone]  Wound Care Therapy  Patient Details  Name: Jorge Clements MRN: [id number] Date of Birth: 09/14/1945 Referring Provider (PT): Celene Squibb, MD   Encounter Date: 07/12/2018  PT End of Session - 07/12/18 1201    Visit Number  17    Number of Visits  24    Date for PT Re-Evaluation  07/19/18   Minireassess 06/21/18   Authorization Type  [employer] medicare PPO ( no auth required, no visit limit)    Authorization Time Period  05/24/2018 - 07/21/2018    Authorization - Visit Number  2    Authorization - Number of Visits  10    PT Start Time  1028    PT Stop Time  1111    PT Time Calculation (min)  43 min    Activity Tolerance  Patient tolerated treatment well    Behavior During Therapy  [facility] for tasks assessed/performed       Past Medical History:  Diagnosis Date  . COPD (chronic obstructive pulmonary disease) ([place])     Past Surgical History:  Procedure Laterality Date  . ABDOMINAL AORTOGRAM N/A 08/17/2017   Procedure: ABDOMINAL AORTOGRAM;  Surgeon: Waynetta Sandy, MD;  Location: [facility];  Service: Cardiovascular;  Laterality: N/A;  . AMPUTATION Right 08/21/2017   Procedure: TRANSMETATARSAL AMPUTATION RIGHT FOOT;  Surgeon: Newt Minion, MD;  Location: [facility];  Service: Orthopedics;  Laterality: Right;  . AMPUTATION TOE Right 08/18/2017   Procedure: AMPUTATION RIGHT SECOND AND THIRD TOES;  Surgeon: Waynetta Sandy, MD;  Location: [facility];  Service: Vascular;  Laterality: Right;  . ENDARTERECTOMY FEMORAL Right 08/18/2017   Procedure: ENDARTERECTOMY RIGHT SUPERFICIAL Jerene Canny;  Surgeon: Waynetta Sandy, MD;  Location: [facility];  Service: Vascular;  Laterality: Right;  . FEMORAL-POPLITEAL BYPASS GRAFT Right 08/18/2017   Procedure: BYPASS GRAFT RIGHT COMMON FEMORAL TO ABOVE KNEE POPLITEAL ARTERY USING RIGHT REVERSED GREAT  SAPHENOUS VEIN;  Surgeon: Waynetta Sandy, MD;  Location: [facility];  Service: Vascular;  Laterality: Right;  . LOWER EXTREMITY ANGIOGRAPHY Right 08/17/2017   Procedure: Lower Extremity Angiography;  Surgeon: Waynetta Sandy, MD;  Location: [facility];  Service: Cardiovascular;  Laterality: Right;  . PERIPHERAL VASCULAR BALLOON ANGIOPLASTY Right 08/17/2017   Procedure: PERIPHERAL VASCULAR BALLOON ANGIOPLASTY;  Surgeon: Waynetta Sandy, MD;  Location: [facility];  Service: Cardiovascular;  Laterality: Right;  SFA UNABLE TO CROSS  . VEIN HARVEST Right 08/18/2017   Procedure: VEIN HARVEST RIGHT GREAT SAPHENOUS;  Surgeon: Waynetta Sandy, MD;  Location: [facility];  Service: Vascular;  Laterality: Right;  . WOUND DEBRIDEMENT Right 08/18/2017   Procedure: DEBRIDEMENT WOUND RIGHT FOOT;  Surgeon: Waynetta Sandy, MD;  Location: [facility];  Service: Vascular;  Laterality: Right;    There were no vitals filed for this visit.    Wound Therapy - 07/12/18 1150    Subjective  Patient arrives with dressing intact and reports no pain.     Patient and Family Stated Goals  wounds to heal    Date of Onset  --   >1 year ago   Prior Treatments  no self care, prior amputations    Pain Scale  0-10    Pain Score  0-No pain    Evaluation and Treatment Procedures Explained to Patient/Family  Yes    Evaluation and Treatment Procedures  agreed to    Wound Properties Date First Assessed: 05/26/18 Time First Assessed: 1600 Wound Type: Other (Comment) , opening plantar surface  Location: Foot Location Orientation: Right Present on Admission: Yes   Dressing Type  Silver hydrofiber;Compression wrap   silverhydrofiber, ABD, profore lite   Dressing Changed  Changed    Dressing Status  Old drainage    Dressing Change Frequency  PRN    Site / Wound Assessment  Clean;Dry    % Wound base Red or Granulating  85%   red   % Wound base Yellow/Fibrinous Exudate  5%   in center of  wound bed   % Wound base Other/Granulation Tissue (Comment)  10%   pale tissue   Peri-wound Assessment  Intact    Drainage Amount  Minimal    Drainage Description  Serosanguineous    Treatment  Cleansed;Debridement (Selective);Hydrotherapy (Pulse lavage)    Wound Properties Date First Assessed: 05/24/18 Time First Assessed: 1432 Wound Type: Other (Comment) Location: Ankle Location Orientation: Right;Medial Wound Description (Comments): Diffuse wound at Rt medial ankle, circular and patchy Present on Admission: Yes   Dressing Type  Impregnated gauze (bismuth);Compression wrap   xeroform, profore lite   Dressing Changed  --   no dressing   Dressing Status  Old drainage    Dressing Change Frequency  PRN    Site / Wound Assessment  Pink;Red;Yellow    % Wound base Red or Granulating  --   small patches of openings; intact skin surrounding   Peri-wound Assessment  Hemosiderin;Intact    Drainage Amount  None    Treatment  Cleansed    Wound Properties Date First Assessed: 05/24/18 Time First Assessed: 4782 Location: Foot Location Orientation: Right;Anterior;Posterior Wound Description (Comments): Diffuse wound at Metatarsal heads on dorsal and volar foot surface Present on Admission: Yes   Dressing Type  Impregnated gauze (bismuth);Compression wrap   xeroform, profore lite   Dressing Changed  --   no dressing   Dressing Status  Old drainage    Dressing Change Frequency  PRN    Site / Wound Assessment  Dry;Pink;Pale;Yellow    % Wound base Red or Granulating  --   small patches of openings; intact skin surrounding   Peri-wound Assessment  Intact;Hemosiderin    Drainage Amount  None    Treatment  Cleansed    Pulsed lavage therapy - wound location  plantar aspect    Pulsed Lavage with Suction (psi)  4 psi    Pulsed Lavage with Suction - Normal Saline Used  1000 mL    Pulsed Lavage Tip  Tip with splash shield    Selective Debridement - Location  plantar foot    Selective Debridement - Tools  Used  Forceps;Scissors    Selective Debridement - Tissue Removed  Slough, dried exudated, devitalized tissue, callous.    Wound Therapy - Clinical Statement  Patient's wound continues to fill in. Slough and biofilm easily debrided and pulsed lavage continued to wound bed to address a small flap/undermined area of tissue is in middle of wound bed has some on the lateral side of the wound that is not easily debrided. Selective debridement continued to margins to address callous and maintain open margins for approximation. Flap with undermined area is elevated beyond skin surface and ABD with medipore tape stretched overtop of dressing to apply pressure and reduce hypergranulation. Profore lite continued for edema management as well. Patient will continue to benefit from skilled wound care to promote healing.  Wound Therapy - Functional Problem List  Impared walking/mobility, impaired self care/ADL performance    Factors Delaying/Impairing Wound Healing  Altered sensation;Tobacco use;Vascular compromise    Hydrotherapy Plan  Debridement;Dressing change;Patient/family education;Pulsatile lavage with suction    Wound Therapy - Frequency  3X / week    Wound Therapy - Current Recommendations  PT    Wound Plan  Measure Mondays. Continue with profore lite for edema management. Continue with topical agents and debridement as appropriate for wound healing stage.    Dressing  Plantar foot wound: sliver hydrofiber, ABD, profore lite        PT Short Term Goals - 06/21/18 1441      PT SHORT TERM GOAL #1   Title  Patient will have no callous present at base of Rt foot to indicate improve WB tolerance and self care.    Baseline  no callous but wound present    Time  4    Period  Weeks    Status  Partially Met    Target Date  06/21/18      PT SHORT TERM GOAL #2   Title  Wound will reduce by 50% surface area to indicate wound healing and progress towards proliferative stage of healing away from inflammatory  stage.    Baseline  achieved for ant foot and medial ankle wounds    Time  4    Period  Weeks    Status  Partially Met        PT Long Term Goals - 06/21/18 1442      PT LONG TERM GOAL #1   Title  Rt medial ankle wound will be healing with 90% reducation in surface area indicating goof approximation of wound with no evidence of imparied periwound integrity.    Time  8    Period  Weeks    Status  On-going      PT LONG TERM GOAL #2   Title  Rt foot wound will be healing with 90% reducation in surface area indicating goof approximation of wound with no evidence of imparied periwound integrity.    Time  8    Period  Weeks    Status  On-going      PT LONG TERM GOAL #3   Title  Patient will have 75% reduced edema and if appropriate per vascular MD (Possible Dr. Donzetta Matters) patient will be independent in donnig/doffing and carign for compresison garment to manage edema of Rt LE.    Time  8    Period  Weeks    Status  On-going        Plan - 07/12/18 1201    Clinical Impression Statement  see above    Rehab Potential  Fair    Clinical Impairments Affecting Rehab Potential  (-) chronic woudn history, (-) decreaed self care motivation    PT Frequency  3x / week    PT Treatment/Interventions  ADLs/Self Care Home Management;Compression bandaging;Manual techniques;Patient/family education   skilled wound care, selective debridment, compression wrapping   PT Next Visit Plan  see above    Consulted and Agree with Plan of Care  Patient       Patient will benefit from skilled therapeutic intervention in order to improve the following deficits and impairments:  Abnormal gait, Decreased balance, Decreased skin integrity, Increased edema, Impaired sensation, Decreased mobility, Difficulty walking  Visit Diagnosis: Open wound of right ankle, initial encounter  Open wound of right foot, initial encounter     Problem List  Patient Active Problem List   Diagnosis Date Noted  . Status post  transmetatarsal amputation of right foot ([place])   . Subacute osteomyelitis, right ankle and foot ([place])   . Infected blister of foot 08/13/2017  . Pressure injury of skin 08/12/2017  . Weakness 08/11/2017  . COPD (chronic obstructive pulmonary disease) ([place]) 08/11/2017  . Cellulitis of right lower extremity 08/11/2017  . Fall at home, initial encounter 08/11/2017  . Thrombocytopenia ([place]) 08/11/2017  . Weakness generalized 08/11/2017  . Hypokalemia 08/11/2017  . Essential hypertension 08/11/2017    Kipp Brood, PT, DPT, [facility] Physical Therapist with [facility]  07/12/2018 12:01 PM     [facility] [address] [place], [place], [address] Phone: [phone]   Fax:  [phone]  Name: Jorge Clements MRN: [id number] Date of Birth: 1946/03/31

## 2018-07-14 ENCOUNTER — Other Ambulatory Visit: Payer: Self-pay

## 2018-07-14 ENCOUNTER — Encounter (HOSPITAL_COMMUNITY): Payer: Self-pay

## 2018-07-14 ENCOUNTER — Ambulatory Visit (HOSPITAL_COMMUNITY): Payer: Medicare HMO

## 2018-07-14 DIAGNOSIS — S91001A Unspecified open wound, right ankle, initial encounter: Secondary | ICD-10-CM

## 2018-07-14 DIAGNOSIS — S91301A Unspecified open wound, right foot, initial encounter: Secondary | ICD-10-CM

## 2018-07-14 NOTE — Therapy (Signed)
[place], [place], [address] Phone: [phone]   Fax:  [phone]  Wound Care Therapy  Patient Details  Name: Jorge Clements MRN: [id number] Date of Birth: [birth year] Referring Provider (PT): Celene Squibb, MD   Encounter Date: 07/14/2018  PT End of Session - 07/14/18 1127    Visit Number  18    Number of Visits  24    Date for PT Re-Evaluation  07/19/18   Minireassess 06/21/18   Authorization Type  [employer] medicare PPO ( no auth required, no visit limit)    Authorization Time Period  05/24/2018 - 07/21/2018    Authorization - Visit Number  3    Authorization - Number of Visits  10    PT Start Time  1030    PT Stop Time  1110    PT Time Calculation (min)  40 min    Activity Tolerance  Patient tolerated treatment well    Behavior During Therapy  [facility] for tasks assessed/performed       Past Medical History:  Diagnosis Date  . COPD (chronic obstructive pulmonary disease) ([place])     Past Surgical History:  Procedure Laterality Date  . ABDOMINAL AORTOGRAM N/A 08/17/2017   Procedure: ABDOMINAL AORTOGRAM;  Surgeon: Waynetta Sandy, MD;  Location: [facility];  Service: Cardiovascular;  Laterality: N/A;  . AMPUTATION Right 08/21/2017   Procedure: TRANSMETATARSAL AMPUTATION RIGHT FOOT;  Surgeon: Newt Minion, MD;  Location: [place];  Service: Orthopedics;  Laterality: Right;  . AMPUTATION TOE Right 08/18/2017   Procedure: AMPUTATION RIGHT SECOND AND THIRD TOES;  Surgeon: Waynetta Sandy, MD;  Location: [place];  Service: Vascular;  Laterality: Right;  . ENDARTERECTOMY FEMORAL Right 08/18/2017   Procedure: ENDARTERECTOMY RIGHT SUPERFICIAL Jerene Canny;  Surgeon: Waynetta Sandy, MD;  Location: [place];  Service: Vascular;  Laterality: Right;  . FEMORAL-POPLITEAL BYPASS GRAFT Right 08/18/2017   Procedure: BYPASS GRAFT RIGHT COMMON FEMORAL TO ABOVE KNEE POPLITEAL ARTERY USING RIGHT REVERSED GREAT  SAPHENOUS VEIN;  Surgeon: Waynetta Sandy, MD;  Location: [place];  Service: Vascular;  Laterality: Right;  . LOWER EXTREMITY ANGIOGRAPHY Right 08/17/2017   Procedure: Lower Extremity Angiography;  Surgeon: Waynetta Sandy, MD;  Location: [place] [facility];  Service: Cardiovascular;  Laterality: Right;  . PERIPHERAL VASCULAR BALLOON ANGIOPLASTY Right 08/17/2017   Procedure: PERIPHERAL VASCULAR BALLOON ANGIOPLASTY;  Surgeon: Waynetta Sandy, MD;  Location: [facility] [facility];  Service: Cardiovascular;  Laterality: Right;  SFA UNABLE TO CROSS  . VEIN HARVEST Right 08/18/2017   Procedure: VEIN HARVEST RIGHT GREAT SAPHENOUS;  Surgeon: Waynetta Sandy, MD;  Location: [facility];  Service: Vascular;  Laterality: Right;  . WOUND DEBRIDEMENT Right 08/18/2017   Procedure: DEBRIDEMENT WOUND RIGHT FOOT;  Surgeon: Waynetta Sandy, MD;  Location: [facility];  Service: Vascular;  Laterality: Right;    There were no vitals filed for this visit.    Wound Therapy - 07/14/18 1116    Subjective  Patient arrives with dressing intact and denies any pain and has nothing to report since last session.     Patient and Family Stated Goals  wounds to heal    Date of Onset  --   >1 year ago   Prior Treatments  no self care, prior amputations    Pain Scale  0-10    Pain Score  0-No pain    Evaluation and Treatment Procedures Explained to Patient/Family  Yes  Evaluation and Treatment Procedures  agreed to    Wound Properties Date First Assessed: 05/26/18 Time First Assessed: 1600 Wound Type: Other (Comment) , opening plantar surface  Location: Foot Location Orientation: Right Present on Admission: Yes   Dressing Type  Silver hydrofiber;Compression wrap   silverhydrofiber, ABD, profore lite   Dressing Changed  Changed    Dressing Status  Old drainage    Dressing Change Frequency  PRN    Site / Wound Assessment  Clean;Dry    % Wound base Red or Granulating  90%   red   % Wound base  Yellow/Fibrinous Exudate  5%   in center of wound bed   % Wound base Other/Granulation Tissue (Comment)  5%   pale tissue   Peri-wound Assessment  Intact    Drainage Amount  Minimal    Drainage Description  Serosanguineous    Treatment  Cleansed;Debridement (Selective)    Wound Properties Date First Assessed: 05/24/18 Time First Assessed: 1432 Wound Type: Other (Comment) Location: Ankle Location Orientation: Right;Medial Wound Description (Comments): Diffuse wound at Rt medial ankle, circular and patchy Present on Admission: Yes Final Assessment Date: 07/14/18   Dressing Type  Compression wrap    Dressing Status  Clean;Dry    Dressing Change Frequency  PRN    Site / Wound Assessment  Pink;Red;Yellow    % Wound base Red or Granulating  --   small patches of openings; intact skin surrounding   Peri-wound Assessment  Hemosiderin;Intact    Wound Length (cm)  0 cm    Wound Width (cm)  0 cm    Wound Depth (cm)  0 cm    Wound Volume (cm^3)  0 cm^3    Wound Surface Area (cm^2)  0 cm^2    Margins  Attached edges (approximated)    Closure  Approximated    Drainage Amount  None    Treatment  Cleansed    Wound Properties Date First Assessed: 05/24/18 Time First Assessed: 1610 Location: Foot Location Orientation: Right;Anterior;Posterior Wound Description (Comments): Diffuse wound at Metatarsal heads on dorsal and volar foot surface Present on Admission: Yes Final Assessment Date: 07/14/18   Dressing Type  Compression wrap    Dressing Status  Clean;Dry    Dressing Change Frequency  PRN    Site / Wound Assessment  Dry;Pink;Pale;Yellow    % Wound base Red or Granulating  --   small patches of openings; intact skin surrounding   Peri-wound Assessment  Intact;Hemosiderin    Wound Length (cm)  0 cm    Wound Width (cm)  0 cm    Wound Depth (cm)  0 cm    Wound Volume (cm^3)  0 cm^3    Wound Surface Area (cm^2)  0 cm^2    Margins  Attached edges (approximated)    Closure  Approximated    Drainage  Amount  None    Treatment  Cleansed    Pulsed lavage therapy - wound location  --    Pulsed Lavage with Suction (psi)  --    Pulsed Lavage with Suction - Normal Saline Used  --    Pulsed Lavage Tip  --    Selective Debridement - Location  plantar foot    Selective Debridement - Tools Used  Forceps;Scissors    Selective Debridement - Tissue Removed  Slough, dried exudated, devitalized tissue, callous.    Wound Therapy - Clinical Statement  Patient's wound presents with some increase in red coloration to wound bed. He continues to have  a flap/undermined area of tissue is in middle of wound bed has some on the lateral side of the wound which appears to be closing in. No pulsed lavage performed this session and selective debridement performed to maintain open wound margins and remove slough/biofilm form wound bed. Wound dressed with silver hydrofiber followed by ABD with medipore tape stretched overtop of dressing to apply pressure to flap as it remains slightly elevated. Profore lite continued for edema control. Patient will continue to benefit from skilled wound care to promote healing.    Wound Therapy - Functional Problem List  Impared walking/mobility, impaired self care/ADL performance    Factors Delaying/Impairing Wound Healing  Altered sensation;Tobacco use;Vascular compromise    Hydrotherapy Plan  Debridement;Dressing change;Patient/family education;Pulsatile lavage with suction    Wound Therapy - Frequency  3X / week    Wound Therapy - Current Recommendations  PT    Wound Plan  Measure Mondays. Continue with profore lite for edema management. Continue with topical agents and debridement as appropriate for wound healing stage.    Dressing  Plantar foot wound: sliver hydrofiber, ABD, profore lite        PT Short Term Goals - 06/21/18 1441      PT SHORT TERM GOAL #1   Title  Patient will have no callous present at base of Rt foot to indicate improve WB tolerance and self care.    Baseline   no callous but wound present    Time  4    Period  Weeks    Status  Partially Met    Target Date  06/21/18      PT SHORT TERM GOAL #2   Title  Wound will reduce by 50% surface area to indicate wound healing and progress towards proliferative stage of healing away from inflammatory stage.    Baseline  achieved for ant foot and medial ankle wounds    Time  4    Period  Weeks    Status  Partially Met        PT Long Term Goals - 06/21/18 1442      PT LONG TERM GOAL #1   Title  Rt medial ankle wound will be healing with 90% reducation in surface area indicating goof approximation of wound with no evidence of imparied periwound integrity.    Time  8    Period  Weeks    Status  On-going      PT LONG TERM GOAL #2   Title  Rt foot wound will be healing with 90% reducation in surface area indicating goof approximation of wound with no evidence of imparied periwound integrity.    Time  8    Period  Weeks    Status  On-going      PT LONG TERM GOAL #3   Title  Patient will have 75% reduced edema and if appropriate per vascular MD (Possible Dr. Donzetta Matters) patient will be independent in donnig/doffing and carign for compresison garment to manage edema of Rt LE.    Time  8    Period  Weeks    Status  On-going        Plan - 07/14/18 1127    Clinical Impression Statement  see above    Rehab Potential  Fair    Clinical Impairments Affecting Rehab Potential  (-) chronic woudn history, (-) decreaed self care motivation    PT Frequency  3x / week    PT Treatment/Interventions  ADLs/Self Care Home Management;Compression bandaging;Manual techniques;Patient/family  education   skilled wound care, selective debridment, compression wrapping   PT Next Visit Plan  see above    Consulted and Agree with Plan of Care  Patient       Patient will benefit from skilled therapeutic intervention in order to improve the following deficits and impairments:  Abnormal gait, Decreased balance, Decreased skin  integrity, Increased edema, Impaired sensation, Decreased mobility, Difficulty walking  Visit Diagnosis: Open wound of right ankle, initial encounter  Open wound of right foot, initial encounter     Problem List Patient Active Problem List   Diagnosis Date Noted  . Status post transmetatarsal amputation of right foot ([place])   . Subacute osteomyelitis, right ankle and foot ([place])   . Infected blister of foot 08/13/2017  . Pressure injury of skin 08/12/2017  . Weakness 08/11/2017  . COPD (chronic obstructive pulmonary disease) ([place]) 08/11/2017  . Cellulitis of right lower extremity 08/11/2017  . Fall at home, initial encounter 08/11/2017  . Thrombocytopenia ([place]) 08/11/2017  . Weakness generalized 08/11/2017  . Hypokalemia 08/11/2017  . Essential hypertension 08/11/2017    Kipp Brood, PT, DPT, [facility] Physical Therapist with [facility]  07/14/2018 11:28 AM     [place] [address] [place], [place], [address] Phone: [phone]   Fax:  [phone]  Name: Olanda Downie MRN: [id number] Date of Birth: 03/29/1946

## 2018-07-17 ENCOUNTER — Encounter (HOSPITAL_COMMUNITY): Payer: Self-pay

## 2018-07-17 ENCOUNTER — Other Ambulatory Visit: Payer: Self-pay

## 2018-07-17 ENCOUNTER — Ambulatory Visit (HOSPITAL_COMMUNITY): Payer: Medicare HMO

## 2018-07-17 DIAGNOSIS — S91301A Unspecified open wound, right foot, initial encounter: Secondary | ICD-10-CM

## 2018-07-17 DIAGNOSIS — S91001A Unspecified open wound, right ankle, initial encounter: Secondary | ICD-10-CM

## 2018-07-17 NOTE — Therapy (Signed)
[place], [place], [address] Phone: [phone]   Fax:  [phone]  Wound Care Therapy  Patient Details  Name: Jorge Clements MRN: [id number] Date of Birth: 12/10/1945 Referring Provider (PT): Celene Squibb, MD   Encounter Date: 07/17/2018  PT End of Session - 07/17/18 1149    Visit Number  19    Number of Visits  28    Date for PT Re-Evaluation  07/19/18   Minireassess 06/21/18   Authorization Type  [employer] medicare PPO ( no auth required, no visit limit)    Authorization Time Period  05/24/2018 - 07/21/2018; 07/17/18-08/07/18    Authorization - Visit Number  4    Authorization - Number of Visits  10    PT Start Time  1031    PT Stop Time  1108    PT Time Calculation (min)  37 min    Activity Tolerance  Patient tolerated treatment well    Behavior During Therapy  [facility] for tasks assessed/performed       Past Medical History:  Diagnosis Date  . COPD (chronic obstructive pulmonary disease) ([place])     Past Surgical History:  Procedure Laterality Date  . ABDOMINAL AORTOGRAM N/A 08/17/2017   Procedure: ABDOMINAL AORTOGRAM;  Surgeon: Waynetta Sandy, MD;  Location: [place];  Service: Cardiovascular;  Laterality: N/A;  . AMPUTATION Right 08/21/2017   Procedure: TRANSMETATARSAL AMPUTATION RIGHT FOOT;  Surgeon: Newt Minion, MD;  Location: [facility];  Service: Orthopedics;  Laterality: Right;  . AMPUTATION TOE Right 08/18/2017   Procedure: AMPUTATION RIGHT SECOND AND THIRD TOES;  Surgeon: Waynetta Sandy, MD;  Location: [place] [facility];  Service: Vascular;  Laterality: Right;  . ENDARTERECTOMY FEMORAL Right 08/18/2017   Procedure: ENDARTERECTOMY RIGHT SUPERFICIAL Jerene Canny;  Surgeon: Waynetta Sandy, MD;  Location: [place];  Service: Vascular;  Laterality: Right;  . FEMORAL-POPLITEAL BYPASS GRAFT Right 08/18/2017   Procedure: BYPASS GRAFT RIGHT COMMON FEMORAL TO ABOVE KNEE POPLITEAL ARTERY USING RIGHT  REVERSED GREAT SAPHENOUS VEIN;  Surgeon: Waynetta Sandy, MD;  Location: [facility];  Service: Vascular;  Laterality: Right;  . LOWER EXTREMITY ANGIOGRAPHY Right 08/17/2017   Procedure: Lower Extremity Angiography;  Surgeon: Waynetta Sandy, MD;  Location: [facility];  Service: Cardiovascular;  Laterality: Right;  . PERIPHERAL VASCULAR BALLOON ANGIOPLASTY Right 08/17/2017   Procedure: PERIPHERAL VASCULAR BALLOON ANGIOPLASTY;  Surgeon: Waynetta Sandy, MD;  Location: [place] [facility];  Service: Cardiovascular;  Laterality: Right;  SFA UNABLE TO CROSS  . VEIN HARVEST Right 08/18/2017   Procedure: VEIN HARVEST RIGHT GREAT SAPHENOUS;  Surgeon: Waynetta Sandy, MD;  Location: [place];  Service: Vascular;  Laterality: Right;  . WOUND DEBRIDEMENT Right 08/18/2017   Procedure: DEBRIDEMENT WOUND RIGHT FOOT;  Surgeon: Waynetta Sandy, MD;  Location: [facility];  Service: Vascular;  Laterality: Right;    There were no vitals filed for this visit.      Wound Therapy - 07/17/18 1131    Subjective  Patient arrives with dresing intact, he denies pain. 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     Patient and Family Stated Goals  wounds to heal    Date of Onset  --   >1 year ago   Prior Treatments  no self care, prior amputations    Pain Scale  0-10    Pain Score  0-No pain    Evaluation and Treatment Procedures Explained to Patient/Family  Yes    Evaluation and Treatment Procedures  agreed to    Wound Properties Date First Assessed: 05/26/18 Time First Assessed: 1600 Wound Type: Other (Comment) , opening plantar surface  Location: Foot Location Orientation: Right Present on Admission: Yes   Dressing Type  Silver hydrofiber;Compression wrap;Alginate   silverhydrofiber, ABD, profore lite   Dressing Changed  Changed    Dressing Status  Old drainage    Dressing Change Frequency  PRN    Site / Wound  Assessment  Clean;Dry    % Wound base Red or Granulating  90%   red   % Wound base Yellow/Fibrinous Exudate  5%   in center of wound bed   % Wound base Other/Granulation Tissue (Comment)  5%   pale tissue   Peri-wound Assessment  Intact    Wound Length (cm)  2 cm   2.4   Wound Width (cm)  1.9 cm   2.4   Wound Depth (cm)  0.4 cm   0.4   Wound Volume (cm^3)  1.52 cm^3    Wound Surface Area (cm^2)  3.8 cm^2    Undermining (cm)  in center of wound bed flap of red tissue has undermining to base of wound bed (0.4 cm)    Drainage Amount  Minimal    Drainage Description  Serosanguineous    Treatment  Cleansed;Debridement (Selective)    Selective Debridement - Location  plantar foot    Selective Debridement - Tools Used  Forceps;Scissors    Selective Debridement - Tissue Removed  Slough, dried exudate, devitalized tissue, margins, callous.    Wound Therapy - Clinical Statement  Patient's wound continues to present with red tissue in wounds bed has reduced size since last measured. The flap flap/undermined area of tissue is in middle of wound appears to be forming together and has reduced undermining depth of 04. Cm today. He continues to have moderate drainage and wound bed is elevated beyond skin surface. Selective debridement continued to maintain open wound margins and remove slough/biofilm form wound bed. Silver hydrofiber applied to wound bed followed by calcium alginate, 2x2, ADB, and gauze with gauze wrap.  Profore lite continued for edema control. Patient will continue to benefit from skilled wound care to promote healing.    Wound Therapy - Functional Problem List  Impared walking/mobility, impaired self care/ADL performance    Factors Delaying/Impairing Wound Healing  Altered sensation;Tobacco use;Vascular compromise    Hydrotherapy Plan  Debridement;Dressing change;Patient/family education;Pulsatile lavage with suction    Wound Therapy - Frequency  3X / week    Wound Therapy - Current  Recommendations  PT    Wound Plan  Measure Mondays. Continue with profore lite for edema management. Continue with topical agents and debridement as appropriate for wound healing stage.    Dressing  Plantar foot wound: sliver hydrofiber, calcium alginate,  ABD, profore lite         PT Short Term Goals - 06/21/18 1441      PT SHORT TERM GOAL #1   Title  Patient will have no callous present at base of Rt foot to indicate improve WB tolerance and self care.    Baseline  no callous but wound present    Time  4    Period  Weeks    Status  Partially Met    Target Date  06/21/18      PT SHORT TERM GOAL #2  Title  Wound will reduce by 50% surface area to indicate wound healing and progress towards proliferative stage of healing away from inflammatory stage.    Baseline  achieved for ant foot and medial ankle wounds    Time  4    Period  Weeks    Status  Partially Met        PT Long Term Goals - 07/17/18 1153      PT LONG TERM GOAL #1   Title  Rt medial ankle wound will be healing with 90% reducation in surface area indicating goof approximation of wound with no evidence of imparied periwound integrity.    Time  8    Period  Weeks    Status  Achieved      PT LONG TERM GOAL #2   Title  Rt foot wound will be healing with 90% reducation in surface area indicating goof approximation of wound with no evidence of imparied periwound integrity.    Time  8    Period  Weeks    Status  On-going      PT LONG TERM GOAL #3   Title  Patient will have 75% reduced edema and if appropriate per vascular MD (Possible Dr. Donzetta Matters) patient will be independent in donnig/doffing and carign for compresison garment to manage edema of Rt LE.    Time  8    Period  Weeks    Status  On-going        Plan - 07/17/18 1151    Clinical Impression Statement  see above    Rehab Potential  Fair    Clinical Impairments Affecting Rehab Potential  (-) chronic woudn history, (-) decreaed self care motivation    PT  Frequency  2x / week    PT Duration  3 weeks    PT Treatment/Interventions  ADLs/Self Care Home Management;Compression bandaging;Manual techniques;Patient/family education   skilled wound care, selective debridment, compression wrapping   PT Next Visit Plan  see above    Consulted and Agree with Plan of Care  Patient       Patient will benefit from skilled therapeutic intervention in order to improve the following deficits and impairments:  Abnormal gait, Decreased balance, Decreased skin integrity, Increased edema, Impaired sensation, Decreased mobility, Difficulty walking  Visit Diagnosis: Open wound of right ankle, initial encounter  Open wound of right foot, initial encounter     Problem List Patient Active Problem List   Diagnosis Date Noted  . Status post transmetatarsal amputation of right foot ([place])   . Subacute osteomyelitis, right ankle and foot ([place])   . Infected blister of foot 08/13/2017  . Pressure injury of skin 08/12/2017  . Weakness 08/11/2017  . COPD (chronic obstructive pulmonary disease) ([place]) 08/11/2017  . Cellulitis of right lower extremity 08/11/2017  . Fall at home, initial encounter 08/11/2017  . Thrombocytopenia ([place]) 08/11/2017  . Weakness generalized 08/11/2017  . Hypokalemia 08/11/2017  . Essential hypertension 08/11/2017    Kipp Brood, PT, DPT, [facility] Physical Therapist with [facility]  07/17/2018 11:53 AM    [facility] [address] [place], [place], [address] Phone: [phone]   Fax:  [phone]  Name: Elder Davidian MRN: [id number] Date of Birth: [birth year]

## 2018-07-19 ENCOUNTER — Ambulatory Visit (HOSPITAL_COMMUNITY): Payer: Medicare HMO | Attending: Internal Medicine

## 2018-07-19 ENCOUNTER — Other Ambulatory Visit: Payer: Self-pay

## 2018-07-19 ENCOUNTER — Encounter (HOSPITAL_COMMUNITY): Payer: Self-pay

## 2018-07-19 DIAGNOSIS — S91301A Unspecified open wound, right foot, initial encounter: Secondary | ICD-10-CM | POA: Diagnosis present

## 2018-07-19 DIAGNOSIS — S91001A Unspecified open wound, right ankle, initial encounter: Secondary | ICD-10-CM | POA: Insufficient documentation

## 2018-07-19 NOTE — Therapy (Signed)
[place], [place], [address] Phone: [phone]   Fax:  [phone]  Wound Care Therapy  Patient Details  Name: Jorge Clements MRN: [id number] Date of Birth: July 22, 1945 Referring Provider (PT): Celene Squibb, MD   Encounter Date: 07/19/2018  PT End of Session - 07/19/18 1200    Visit Number  20    Number of Visits  28    Date for PT Re-Evaluation  07/19/18   Minireassess 06/21/18   Authorization Type  [employer] medicare PPO ( no auth required, no visit limit)    Authorization Time Period  05/24/2018 - 07/21/2018; 07/17/18-08/07/18    Authorization - Visit Number  5    Authorization - Number of Visits  10    PT Start Time  5945    PT Stop Time  1114    PT Time Calculation (min)  42 min    Activity Tolerance  Patient tolerated treatment well    Behavior During Therapy  [facility] for tasks assessed/performed       Past Medical History:  Diagnosis Date  . COPD (chronic obstructive pulmonary disease) ([place])     Past Surgical History:  Procedure Laterality Date  . ABDOMINAL AORTOGRAM N/A 08/17/2017   Procedure: ABDOMINAL AORTOGRAM;  Surgeon: Waynetta Sandy, MD;  Location: [place] [facility];  Service: Cardiovascular;  Laterality: N/A;  . AMPUTATION Right 08/21/2017   Procedure: TRANSMETATARSAL AMPUTATION RIGHT FOOT;  Surgeon: Newt Minion, MD;  Location: [place];  Service: Orthopedics;  Laterality: Right;  . AMPUTATION TOE Right 08/18/2017   Procedure: AMPUTATION RIGHT SECOND AND THIRD TOES;  Surgeon: Waynetta Sandy, MD;  Location: [place];  Service: Vascular;  Laterality: Right;  . ENDARTERECTOMY FEMORAL Right 08/18/2017   Procedure: ENDARTERECTOMY RIGHT SUPERFICIAL Jerene Canny;  Surgeon: Waynetta Sandy, MD;  Location: [place];  Service: Vascular;  Laterality: Right;  . FEMORAL-POPLITEAL BYPASS GRAFT Right 08/18/2017   Procedure: BYPASS GRAFT RIGHT COMMON FEMORAL TO ABOVE KNEE POPLITEAL ARTERY USING RIGHT  REVERSED GREAT SAPHENOUS VEIN;  Surgeon: Waynetta Sandy, MD;  Location: [place];  Service: Vascular;  Laterality: Right;  . LOWER EXTREMITY ANGIOGRAPHY Right 08/17/2017   Procedure: Lower Extremity Angiography;  Surgeon: Waynetta Sandy, MD;  Location: [place] [facility];  Service: Cardiovascular;  Laterality: Right;  . PERIPHERAL VASCULAR BALLOON ANGIOPLASTY Right 08/17/2017   Procedure: PERIPHERAL VASCULAR BALLOON ANGIOPLASTY;  Surgeon: Waynetta Sandy, MD;  Location: [place] [facility];  Service: Cardiovascular;  Laterality: Right;  SFA UNABLE TO CROSS  . VEIN HARVEST Right 08/18/2017   Procedure: VEIN HARVEST RIGHT GREAT SAPHENOUS;  Surgeon: Waynetta Sandy, MD;  Location: [place];  Service: Vascular;  Laterality: Right;  . WOUND DEBRIDEMENT Right 08/18/2017   Procedure: DEBRIDEMENT WOUND RIGHT FOOT;  Surgeon: Waynetta Sandy, MD;  Location: [place];  Service: Vascular;  Laterality: Right;    There were no vitals filed for this visit.     Wound Therapy - 07/19/18 1149    Subjective  Patient arrives with dressing intact, he reports no pain and no news. He is going to the MD on 07/24/18 for a follow up.     Patient and Family Stated Goals  wounds to heal    Date of Onset  --   >1 year ago   Prior Treatments  no self care, prior amputations    Pain Scale  0-10    Pain Score  0-No pain  Evaluation and Treatment Procedures Explained to Patient/Family  Yes    Evaluation and Treatment Procedures  agreed to    Wound Properties Date First Assessed: 05/26/18 Time First Assessed: 1600 Wound Type: Other (Comment) , opening plantar surface  Location: Foot Location Orientation: Right Present on Admission: Yes   Dressing Type  Silver hydrofiber;Compression wrap;Alginate   silverhydrofiber, alginate, ABD, profore lite   Dressing Changed  Changed    Dressing Status  Old drainage    Dressing Change Frequency  PRN    Site / Wound Assessment  Clean;Dry    % Wound  base Red or Granulating  90%   red   % Wound base Yellow/Fibrinous Exudate  5%   in center of wound bed   % Wound base Other/Granulation Tissue (Comment)  5%   pale tissue   Peri-wound Assessment  Intact    Drainage Amount  Minimal    Drainage Description  Serosanguineous    Treatment  Cleansed;Debridement (Selective)    Selective Debridement - Location  plantar foot    Selective Debridement - Tools Used  Forceps;Scissors    Selective Debridement - Tissue Removed  Slough, dried exudate, devitalized tissue, margins, callous.    Wound Therapy - Clinical Statement  Patient's wound continues to present with hypergranulation and moderate drainage. Wound bed remains red/pink granulated tissue and selective debridement continued to address calloused wound margins and slough. He has a follow up with his PCP on Monday 07/24/18 and I believe he will benefit form silver nitrate to address the hypergranulated tissue and allow wound to fully heal. Silver hydrofiber applied to wound bed followed by calcium alginate, 2x2, ADB, and gauze with gauze wrap.  Profore lite continued for edema control. Patient will continue to benefit from skilled wound care to promote healing.    Wound Therapy - Functional Problem List  Impared walking/mobility, impaired self care/ADL performance    Factors Delaying/Impairing Wound Healing  Altered sensation;Tobacco use;Vascular compromise    Hydrotherapy Plan  Debridement;Dressing change;Patient/family education;Pulsatile lavage with suction    Wound Therapy - Frequency  3X / week    Wound Therapy - Current Recommendations  PT    Wound Plan  Measure Mondays. Continue with profore lite for edema management. Continue with topical agents and debridement as appropriate for wound healing stage.    Dressing  Plantar foot wound: sliver hydrofiber, calcium alginate,  ABD, profore lite        PT Education - 07/19/18 1158    Education Details  Educated patient on benefit of silver nitrate  use to address hypergranulated tissue in wound bed and allow for healing/approximation of margins. Encouraged him to discuss this with Dr. Nevada Crane at his appointment on Monday 07/24/18 and told patient I will call MD's office as well.     Person(s) Educated  Patient    Methods  Explanation    Comprehension  Verbalized understanding       PT Short Term Goals - 06/21/18 1441      PT SHORT TERM GOAL #1   Title  Patient will have no callous present at base of Rt foot to indicate improve WB tolerance and self care.    Baseline  no callous but wound present    Time  4    Period  Weeks    Status  Partially Met    Target Date  06/21/18      PT SHORT TERM GOAL #2   Title  Wound will reduce by 50% surface area to  indicate wound healing and progress towards proliferative stage of healing away from inflammatory stage.    Baseline  achieved for ant foot and medial ankle wounds    Time  4    Period  Weeks    Status  Partially Met        PT Long Term Goals - 07/17/18 1153      PT LONG TERM GOAL #1   Title  Rt medial ankle wound will be healing with 90% reducation in surface area indicating goof approximation of wound with no evidence of imparied periwound integrity.    Time  8    Period  Weeks    Status  Achieved      PT LONG TERM GOAL #2   Title  Rt foot wound will be healing with 90% reducation in surface area indicating goof approximation of wound with no evidence of imparied periwound integrity.    Time  8    Period  Weeks    Status  On-going      PT LONG TERM GOAL #3   Title  Patient will have 75% reduced edema and if appropriate per vascular MD (Possible Dr. Donzetta Matters) patient will be independent in donnig/doffing and carign for compresison garment to manage edema of Rt LE.    Time  8    Period  Weeks    Status  On-going        Plan - 07/19/18 1200    Clinical Impression Statement  see above    Rehab Potential  Fair    Clinical Impairments Affecting Rehab Potential  (-) chronic  woudn history, (-) decreaed self care motivation    PT Frequency  2x / week    PT Duration  3 weeks    PT Treatment/Interventions  ADLs/Self Care Home Management;Compression bandaging;Manual techniques;Patient/family education   skilled wound care, selective debridment, compression wrapping   PT Next Visit Plan  see above    Consulted and Agree with Plan of Care  Patient       Patient will benefit from skilled therapeutic intervention in order to improve the following deficits and impairments:  Abnormal gait, Decreased balance, Decreased skin integrity, Increased edema, Impaired sensation, Decreased mobility, Difficulty walking  Visit Diagnosis: Open wound of right ankle, initial encounter  Open wound of right foot, initial encounter     Problem List Patient Active Problem List   Diagnosis Date Noted  . Status post transmetatarsal amputation of right foot ([place])   . Subacute osteomyelitis, right ankle and foot ([place])   . Infected blister of foot 08/13/2017  . Pressure injury of skin 08/12/2017  . Weakness 08/11/2017  . COPD (chronic obstructive pulmonary disease) ([place]) 08/11/2017  . Cellulitis of right lower extremity 08/11/2017  . Fall at home, initial encounter 08/11/2017  . Thrombocytopenia ([place]) 08/11/2017  . Weakness generalized 08/11/2017  . Hypokalemia 08/11/2017  . Essential hypertension 08/11/2017    Jorge Clements, PT, DPT, [facility] Physical Therapist with [facility]  07/19/2018 12:01 PM    [place] [address] [place], [place], [address] Phone: [phone]   Fax:  [phone]  Name: Jorge Clements MRN: [id number] Date of Birth: [birth year]

## 2018-07-21 ENCOUNTER — Encounter (HOSPITAL_COMMUNITY): Payer: Self-pay

## 2018-07-21 ENCOUNTER — Ambulatory Visit (HOSPITAL_COMMUNITY): Payer: Medicare HMO

## 2018-07-21 ENCOUNTER — Other Ambulatory Visit: Payer: Self-pay

## 2018-07-21 DIAGNOSIS — R69 Illness, unspecified: Secondary | ICD-10-CM | POA: Diagnosis not present

## 2018-07-21 DIAGNOSIS — S91301A Unspecified open wound, right foot, initial encounter: Secondary | ICD-10-CM

## 2018-07-21 DIAGNOSIS — I10 Essential (primary) hypertension: Secondary | ICD-10-CM | POA: Diagnosis not present

## 2018-07-21 DIAGNOSIS — S91001A Unspecified open wound, right ankle, initial encounter: Secondary | ICD-10-CM

## 2018-07-21 DIAGNOSIS — E782 Mixed hyperlipidemia: Secondary | ICD-10-CM | POA: Diagnosis not present

## 2018-07-21 DIAGNOSIS — I739 Peripheral vascular disease, unspecified: Secondary | ICD-10-CM | POA: Diagnosis not present

## 2018-07-21 DIAGNOSIS — R7301 Impaired fasting glucose: Secondary | ICD-10-CM | POA: Diagnosis not present

## 2018-07-21 NOTE — Patient Instructions (Signed)
Appointment with Dr. Marcelo Baldy Office Monday 07/24/2018 at 8:45 AM.  Silver nitrate to wound bed to address hypergranular tissue. We will re-wrap your leg at your 10:30 appointment at [facility] Outpatient office on Monday 07/24/18.

## 2018-07-21 NOTE — Therapy (Signed)
[place], [place], [address] Phone: [phone]   Fax:  [phone]  Wound Care Therapy  Patient Details  Name: Jorge Clements MRN: [id number] Date of Birth: [birth year] Referring Provider (PT): Celene Squibb, MD   Encounter Date: 07/21/2018  PT End of Session - 07/21/18 1204    Visit Number  21    Number of Visits  28    Date for PT Re-Evaluation  07/19/18   Minireassess 06/21/18   Authorization Type  [employer] medicare PPO ( no auth required, no visit limit)    Authorization Time Period  05/24/2018 - 07/21/2018; 07/17/18-08/07/18    Authorization - Visit Number  6    Authorization - Number of Visits  10    PT Start Time  1033    PT Stop Time  1118    PT Time Calculation (min)  45 min    Activity Tolerance  Patient tolerated treatment well    Behavior During Therapy  [facility] for tasks assessed/performed       Past Medical History:  Diagnosis Date  . COPD (chronic obstructive pulmonary disease) ([place])     Past Surgical History:  Procedure Laterality Date  . ABDOMINAL AORTOGRAM N/A 08/17/2017   Procedure: ABDOMINAL AORTOGRAM;  Surgeon: Waynetta Sandy, MD;  Location: [place] [facility];  Service: Cardiovascular;  Laterality: N/A;  . AMPUTATION Right 08/21/2017   Procedure: TRANSMETATARSAL AMPUTATION RIGHT FOOT;  Surgeon: Newt Minion, MD;  Location: [place];  Service: Orthopedics;  Laterality: Right;  . AMPUTATION TOE Right 08/18/2017   Procedure: AMPUTATION RIGHT SECOND AND THIRD TOES;  Surgeon: Waynetta Sandy, MD;  Location: [place];  Service: Vascular;  Laterality: Right;  . ENDARTERECTOMY FEMORAL Right 08/18/2017   Procedure: ENDARTERECTOMY RIGHT SUPERFICIAL Jerene Canny;  Surgeon: Waynetta Sandy, MD;  Location: [place];  Service: Vascular;  Laterality: Right;  . FEMORAL-POPLITEAL BYPASS GRAFT Right 08/18/2017   Procedure: BYPASS GRAFT RIGHT COMMON FEMORAL TO ABOVE KNEE POPLITEAL ARTERY USING RIGHT  REVERSED GREAT SAPHENOUS VEIN;  Surgeon: Waynetta Sandy, MD;  Location: [place];  Service: Vascular;  Laterality: Right;  . LOWER EXTREMITY ANGIOGRAPHY Right 08/17/2017   Procedure: Lower Extremity Angiography;  Surgeon: Waynetta Sandy, MD;  Location: [place] [facility];  Service: Cardiovascular;  Laterality: Right;  . PERIPHERAL VASCULAR BALLOON ANGIOPLASTY Right 08/17/2017   Procedure: PERIPHERAL VASCULAR BALLOON ANGIOPLASTY;  Surgeon: Waynetta Sandy, MD;  Location: [place] [facility];  Service: Cardiovascular;  Laterality: Right;  SFA UNABLE TO CROSS  . VEIN HARVEST Right 08/18/2017   Procedure: VEIN HARVEST RIGHT GREAT SAPHENOUS;  Surgeon: Waynetta Sandy, MD;  Location: [place];  Service: Vascular;  Laterality: Right;  . WOUND DEBRIDEMENT Right 08/18/2017   Procedure: DEBRIDEMENT WOUND RIGHT FOOT;  Surgeon: Waynetta Sandy, MD;  Location: [place];  Service: Vascular;  Laterality: Right;    There were no vitals filed for this visit.      Wound Therapy - 07/21/18 1153    Subjective  Patient arrives stating he has not heard from Dr. Durene Cal office about his Monday appointment. He reports he went there today for his bloodwork and he did not ask them about it while he was there.     Patient and Family Stated Goals  wounds to heal    Date of Onset  --   >1 year ago   Prior Treatments  no self care, prior amputations    Pain Scale  0-10    Pain Score  0-No pain    Evaluation and Treatment Procedures Explained to Patient/Family  Yes    Evaluation and Treatment Procedures  agreed to    Wound Properties Date First Assessed: 05/26/18 Time First Assessed: 1600 Wound Type: Other (Comment) , opening plantar surface  Location: Foot Location Orientation: Right Present on Admission: Yes   Dressing Type  Silver hydrofiber;Compression wrap;Alginate   silverhydrofiber, alginate, ABD, profore lite   Dressing Changed  Changed    Dressing Status  Old drainage     Dressing Change Frequency  PRN    Site / Wound Assessment  Clean;Dry    % Wound base Red or Granulating  90%   red   % Wound base Yellow/Fibrinous Exudate  5%   in center of wound bed   % Wound base Other/Granulation Tissue (Comment)  5%   pale tissue   Peri-wound Assessment  Intact    Drainage Amount  Minimal    Drainage Description  Serosanguineous    Treatment  Cleansed;Debridement (Selective)    Selective Debridement - Location  plantar foot    Selective Debridement - Tools Used  Forceps;Scissors    Selective Debridement - Tissue Removed  Slough, dried exudate, devitalized tissue, margins, callous.    Wound Therapy - Clinical Statement  Patient's wound continues to present hypergranulated tissue elevated above skin surface. He has moderate drainage and some maceration is present at inferior wound margin. Continued this session with selective debridement to remove slough and maintain open wound margins. Cathie Olden hydrofiber applied to wound bed followed by calcium alginate, 2x2, ADB, and gauze with gauze wrap.  Continue with profore lite continued for edema control. I called patient PCP, and spoke with Ginger during his therapy session to arrange a Monday morning appointment for physician to perform silver nitrate to address hypergranular tissue. He has an appointment Monday 07/24/18 at 8:45 am prior to coming into therapy for wound care. Patient will continue to benefit from skilled wound care to promote healing.    Wound Therapy - Functional Problem List  Impared walking/mobility, impaired self care/ADL performance    Factors Delaying/Impairing Wound Healing  Altered sensation;Tobacco use;Vascular compromise    Hydrotherapy Plan  Debridement;Dressing change;Patient/family education;Pulsatile lavage with suction    Wound Therapy - Frequency  3X / week    Wound Therapy - Current Recommendations  PT    Wound Plan  Measure Mondays. Continue with profore lite for edema management. Continue with  topical agents and debridement as appropriate for wound healing stage.    Dressing  Plantar foot wound: sliver hydrofiber, calcium alginate,  ABD, profore lite         PT Short Term Goals - 06/21/18 1441      PT SHORT TERM GOAL #1   Title  Patient will have no callous present at base of Rt foot to indicate improve WB tolerance and self care.    Baseline  no callous but wound present    Time  4    Period  Weeks    Status  Partially Met    Target Date  06/21/18      PT SHORT TERM GOAL #2   Title  Wound will reduce by 50% surface area to indicate wound healing and progress towards proliferative stage of healing away from inflammatory stage.    Baseline  achieved for ant foot and medial ankle wounds    Time  4    Period  Weeks  Status  Partially Met        PT Long Term Goals - 07/17/18 1153      PT LONG TERM GOAL #1   Title  Rt medial ankle wound will be healing with 90% reducation in surface area indicating goof approximation of wound with no evidence of imparied periwound integrity.    Time  8    Period  Weeks    Status  Achieved      PT LONG TERM GOAL #2   Title  Rt foot wound will be healing with 90% reducation in surface area indicating goof approximation of wound with no evidence of imparied periwound integrity.    Time  8    Period  Weeks    Status  On-going      PT LONG TERM GOAL #3   Title  Patient will have 75% reduced edema and if appropriate per vascular MD (Possible Dr. Donzetta Matters) patient will be independent in donnig/doffing and carign for compresison garment to manage edema of Rt LE.    Time  8    Period  Weeks    Status  On-going       Plan - 07/21/18 1204    Clinical Impression Statement  see above    Rehab Potential  Fair    Clinical Impairments Affecting Rehab Potential  (-) chronic woudn history, (-) decreaed self care motivation    PT Frequency  2x / week    PT Duration  3 weeks    PT Treatment/Interventions  ADLs/Self Care Home  Management;Compression bandaging;Manual techniques;Patient/family education   skilled wound care, selective debridment, compression wrapping   PT Next Visit Plan  see above    Consulted and Agree with Plan of Care  Patient       Patient will benefit from skilled therapeutic intervention in order to improve the following deficits and impairments:  Abnormal gait, Decreased balance, Decreased skin integrity, Increased edema, Impaired sensation, Decreased mobility, Difficulty walking  Visit Diagnosis: Open wound of right ankle, initial encounter  Open wound of right foot, initial encounter     Problem List Patient Active Problem List   Diagnosis Date Noted  . Status post transmetatarsal amputation of right foot ([place])   . Subacute osteomyelitis, right ankle and foot ([place])   . Infected blister of foot 08/13/2017  . Pressure injury of skin 08/12/2017  . Weakness 08/11/2017  . COPD (chronic obstructive pulmonary disease) ([place]) 08/11/2017  . Cellulitis of right lower extremity 08/11/2017  . Fall at home, initial encounter 08/11/2017  . Thrombocytopenia ([place]) 08/11/2017  . Weakness generalized 08/11/2017  . Hypokalemia 08/11/2017  . Essential hypertension 08/11/2017    Kipp Brood, PT, DPT, [facility] Physical Therapist with [facility]  07/21/2018 12:04 PM    [place] [address] [place], [place], [address] Phone: [phone]   Fax:  [phone]  Name: Jorge Clements MRN: [id number] Date of Birth: 1945-10-27

## 2018-07-24 ENCOUNTER — Other Ambulatory Visit: Payer: Self-pay

## 2018-07-24 ENCOUNTER — Encounter (HOSPITAL_COMMUNITY): Payer: Self-pay

## 2018-07-24 ENCOUNTER — Ambulatory Visit (HOSPITAL_COMMUNITY): Payer: Medicare HMO

## 2018-07-24 DIAGNOSIS — S91001A Unspecified open wound, right ankle, initial encounter: Secondary | ICD-10-CM

## 2018-07-24 DIAGNOSIS — L89892 Pressure ulcer of other site, stage 2: Secondary | ICD-10-CM | POA: Diagnosis not present

## 2018-07-24 DIAGNOSIS — S91301A Unspecified open wound, right foot, initial encounter: Secondary | ICD-10-CM | POA: Diagnosis not present

## 2018-07-24 DIAGNOSIS — Z89421 Acquired absence of other right toe(s): Secondary | ICD-10-CM | POA: Diagnosis not present

## 2018-07-24 NOTE — Therapy (Signed)
[place], [place], [address] Phone: [phone]   Fax:  [phone]  Wound Care Therapy  Patient Details  Name: Jorge Clements MRN: [id number] Date of Birth: 16-May-1945 Referring Provider (PT): Celene Squibb, MD   Encounter Date: 07/24/2018  PT End of Session - 07/24/18 1221    Visit Number  22    Number of Visits  28    Date for PT Re-Evaluation  07/19/18   Minireassess 06/21/18   Authorization Type  [employer] medicare PPO ( no auth required, no visit limit)    Authorization Time Period  05/24/2018 - 07/21/2018; 07/17/18-08/07/18    Authorization - Visit Number  7    Authorization - Number of Visits  10    PT Start Time  3235    PT Stop Time  1110    PT Time Calculation (min)  38 min    Activity Tolerance  Patient tolerated treatment well    Behavior During Therapy  [facility] for tasks assessed/performed       Past Medical History:  Diagnosis Date  . COPD (chronic obstructive pulmonary disease) ([place])     Past Surgical History:  Procedure Laterality Date  . ABDOMINAL AORTOGRAM N/A 08/17/2017   Procedure: ABDOMINAL AORTOGRAM;  Surgeon: Waynetta Sandy, MD;  Location: [place] [facility];  Service: Cardiovascular;  Laterality: N/A;  . AMPUTATION Right 08/21/2017   Procedure: TRANSMETATARSAL AMPUTATION RIGHT FOOT;  Surgeon: Newt Minion, MD;  Location: [place];  Service: Orthopedics;  Laterality: Right;  . AMPUTATION TOE Right 08/18/2017   Procedure: AMPUTATION RIGHT SECOND AND THIRD TOES;  Surgeon: Waynetta Sandy, MD;  Location: [place];  Service: Vascular;  Laterality: Right;  . ENDARTERECTOMY FEMORAL Right 08/18/2017   Procedure: ENDARTERECTOMY RIGHT SUPERFICIAL Jerene Canny;  Surgeon: Waynetta Sandy, MD;  Location: [place];  Service: Vascular;  Laterality: Right;  . FEMORAL-POPLITEAL BYPASS GRAFT Right 08/18/2017   Procedure: BYPASS GRAFT RIGHT COMMON FEMORAL TO ABOVE KNEE POPLITEAL ARTERY USING RIGHT  REVERSED GREAT SAPHENOUS VEIN;  Surgeon: Waynetta Sandy, MD;  Location: [place];  Service: Vascular;  Laterality: Right;  . LOWER EXTREMITY ANGIOGRAPHY Right 08/17/2017   Procedure: Lower Extremity Angiography;  Surgeon: Waynetta Sandy, MD;  Location: [place] [facility];  Service: Cardiovascular;  Laterality: Right;  . PERIPHERAL VASCULAR BALLOON ANGIOPLASTY Right 08/17/2017   Procedure: PERIPHERAL VASCULAR BALLOON ANGIOPLASTY;  Surgeon: Waynetta Sandy, MD;  Location: [place] [facility];  Service: Cardiovascular;  Laterality: Right;  SFA UNABLE TO CROSS  . VEIN HARVEST Right 08/18/2017   Procedure: VEIN HARVEST RIGHT GREAT SAPHENOUS;  Surgeon: Waynetta Sandy, MD;  Location: [place];  Service: Vascular;  Laterality: Right;  . WOUND DEBRIDEMENT Right 08/18/2017   Procedure: DEBRIDEMENT WOUND RIGHT FOOT;  Surgeon: Waynetta Sandy, MD;  Location: [place];  Service: Vascular;  Laterality: Right;    There were no vitals filed for this visit.    Wound Therapy - 07/24/18 1113    Subjective  Patient arrives with dressing removed earlier today by NP at PCP's office. They have ordered duoderm for patient. He arrived with "duoderm extra thin" which he was given by pharmacy at [facility].     Patient and Family Stated Goals  wounds to heal    Date of Onset  --   >1 year ago   Prior Treatments  no self care, prior amputations    Pain Scale  0-10  Pain Score  0-No pain    Evaluation and Treatment Procedures Explained to Patient/Family  Yes    Evaluation and Treatment Procedures  agreed to    Wound Properties Date First Assessed: 05/26/18 Time First Assessed: 1600 Wound Type: Other (Comment) , opening plantar surface  Location: Foot Location Orientation: Right Present on Admission: Yes   Dressing Type  Hydrocolloid;Compression wrap;Gauze (Comment)   duoderm, 2x2, profore lite   Dressing Changed  Changed    Dressing Status  Old drainage    Dressing  Change Frequency  PRN    Site / Wound Assessment  Clean;Dry    % Wound base Red or Granulating  90%   red   % Wound base Yellow/Fibrinous Exudate  5%   in center of wound bed   % Wound base Other/Granulation Tissue (Comment)  5%   pale tissue   Peri-wound Assessment  Intact    Wound Length (cm)  1.8 cm   2.0   Wound Width (cm)  2 cm   1.9   Wound Depth (cm)  0.3 cm   0.4   Wound Volume (cm^3)  1.08 cm^3    Wound Surface Area (cm^2)  3.6 cm^2    Drainage Amount  Minimal    Drainage Description  Serosanguineous    Treatment  Cleansed;Debridement (Selective)    Selective Debridement - Location  plantar foot    Selective Debridement - Tools Used  Forceps;Scalpel    Selective Debridement - Tissue Removed  Slough, dried exudate, devitalized tissue, margins, callous.    Wound Therapy - Clinical Statement  Patient was seen this AM by PCP office and silver nitrate was not performed to wound bed. Patient was prescribed duoderm to apply to wound bed per MD recommendation. Continued with selective debridement to remove slough and maintain open wound margins. Applied duoderm extra thin and 2x2 gauze followed by profore lite to manage edema. He has reduced frequency to 2x/week. He will continue to benefit from skilled wound care to promote healing.    Wound Therapy - Functional Problem List  Impared walking/mobility, impaired self care/ADL performance    Factors Delaying/Impairing Wound Healing  Altered sensation;Tobacco use;Vascular compromise    Hydrotherapy Plan  Debridement;Dressing change;Patient/family education;Pulsatile lavage with suction    Wound Therapy - Frequency  2X / week    Wound Therapy - Current Recommendations  PT    Wound Plan  Measure Mondays. Continue with profore lite for edema management. Continue with topical agents and debridement as appropriate for wound healing stage.    Dressing  Plantar foot wound: duoderm extra thin, gauze, profore lite          PT Short Term  Goals - 06/21/18 1441      PT SHORT TERM GOAL #1   Title  Patient will have no callous present at base of Rt foot to indicate improve WB tolerance and self care.    Baseline  no callous but wound present    Time  4    Period  Weeks    Status  Partially Met    Target Date  06/21/18      PT SHORT TERM GOAL #2   Title  Wound will reduce by 50% surface area to indicate wound healing and progress towards proliferative stage of healing away from inflammatory stage.    Baseline  achieved for ant foot and medial ankle wounds    Time  4    Period  Weeks    Status  Partially  Met        PT Long Term Goals - 07/17/18 1153      PT LONG TERM GOAL #1   Title  Rt medial ankle wound will be healing with 90% reducation in surface area indicating goof approximation of wound with no evidence of imparied periwound integrity.    Time  8    Period  Weeks    Status  Achieved      PT LONG TERM GOAL #2   Title  Rt foot wound will be healing with 90% reducation in surface area indicating goof approximation of wound with no evidence of imparied periwound integrity.    Time  8    Period  Weeks    Status  On-going      PT LONG TERM GOAL #3   Title  Patient will have 75% reduced edema and if appropriate per vascular MD (Possible Dr. Donzetta Matters) patient will be independent in donnig/doffing and carign for compresison garment to manage edema of Rt LE.    Time  8    Period  Weeks    Status  On-going        Plan - 07/24/18 1221    Clinical Impression Statement  see above    Rehab Potential  Fair    Clinical Impairments Affecting Rehab Potential  (-) chronic woudn history, (-) decreaed self care motivation    PT Frequency  2x / week    PT Duration  3 weeks    PT Treatment/Interventions  ADLs/Self Care Home Management;Compression bandaging;Manual techniques;Patient/family education   skilled wound care, selective debridment, compression wrapping   PT Next Visit Plan  see above    Consulted and Agree with  Plan of Care  Patient       Patient will benefit from skilled therapeutic intervention in order to improve the following deficits and impairments:  Abnormal gait, Decreased balance, Decreased skin integrity, Increased edema, Impaired sensation, Decreased mobility, Difficulty walking  Visit Diagnosis: Open wound of right ankle, initial encounter  Open wound of right foot, initial encounter     Problem List Patient Active Problem List   Diagnosis Date Noted  . Status post transmetatarsal amputation of right foot ([place])   . Subacute osteomyelitis, right ankle and foot ([place])   . Infected blister of foot 08/13/2017  . Pressure injury of skin 08/12/2017  . Weakness 08/11/2017  . COPD (chronic obstructive pulmonary disease) ([place]) 08/11/2017  . Cellulitis of right lower extremity 08/11/2017  . Fall at home, initial encounter 08/11/2017  . Thrombocytopenia ([place]) 08/11/2017  . Weakness generalized 08/11/2017  . Hypokalemia 08/11/2017  . Essential hypertension 08/11/2017    Kipp Brood, PT, DPT, [facility] Physical Therapist with [facility]  07/24/2018 12:22 PM    [place] [address] [place], [place], [address] Phone: [phone]   Fax:  [phone]  Name: Jorge Clements MRN: [id number] Date of Birth: 1945/04/27

## 2018-07-26 ENCOUNTER — Ambulatory Visit (HOSPITAL_COMMUNITY): Payer: Medicare HMO

## 2018-07-28 ENCOUNTER — Ambulatory Visit (HOSPITAL_COMMUNITY): Payer: Medicare HMO

## 2018-07-28 ENCOUNTER — Other Ambulatory Visit: Payer: Self-pay

## 2018-07-28 ENCOUNTER — Encounter (HOSPITAL_COMMUNITY): Payer: Self-pay

## 2018-07-28 DIAGNOSIS — S91001A Unspecified open wound, right ankle, initial encounter: Secondary | ICD-10-CM | POA: Diagnosis not present

## 2018-07-28 DIAGNOSIS — S91301A Unspecified open wound, right foot, initial encounter: Secondary | ICD-10-CM | POA: Diagnosis not present

## 2018-07-28 NOTE — Therapy (Signed)
[place], [place], [address] Phone: [phone]   Fax:  [phone]  Wound Care Therapy  Patient Details  Name: Jorge Clements MRN: [id number] Date of Birth: October 07, 1945 Referring Provider (PT): Celene Squibb, MD   Encounter Date: 07/28/2018  PT End of Session - 07/28/18 1213    Visit Number  23    Number of Visits  28    Date for PT Re-Evaluation  07/19/18   Minireassess 06/21/18   Authorization Type  [employer] medicare PPO ( no auth required, no visit limit)    Authorization Time Period  05/24/2018 - 07/21/2018; 07/17/18-08/07/18    Authorization - Visit Number  8    Authorization - Number of Visits  10    PT Start Time  1036    PT Stop Time  1118    PT Time Calculation (min)  42 min    Activity Tolerance  Patient tolerated treatment well    Behavior During Therapy  [facility] for tasks assessed/performed       Past Medical History:  Diagnosis Date  . COPD (chronic obstructive pulmonary disease) ([place])     Past Surgical History:  Procedure Laterality Date  . ABDOMINAL AORTOGRAM N/A 08/17/2017   Procedure: ABDOMINAL AORTOGRAM;  Surgeon: Waynetta Sandy, MD;  Location: [place] [facility];  Service: Cardiovascular;  Laterality: N/A;  . AMPUTATION Right 08/21/2017   Procedure: TRANSMETATARSAL AMPUTATION RIGHT FOOT;  Surgeon: Newt Minion, MD;  Location: [place];  Service: Orthopedics;  Laterality: Right;  . AMPUTATION TOE Right 08/18/2017   Procedure: AMPUTATION RIGHT SECOND AND THIRD TOES;  Surgeon: Waynetta Sandy, MD;  Location: [place];  Service: Vascular;  Laterality: Right;  . ENDARTERECTOMY FEMORAL Right 08/18/2017   Procedure: ENDARTERECTOMY RIGHT SUPERFICIAL Jerene Canny;  Surgeon: Waynetta Sandy, MD;  Location: [place];  Service: Vascular;  Laterality: Right;  . FEMORAL-POPLITEAL BYPASS GRAFT Right 08/18/2017   Procedure: BYPASS GRAFT RIGHT COMMON FEMORAL TO ABOVE KNEE POPLITEAL ARTERY USING RIGHT  REVERSED GREAT SAPHENOUS VEIN;  Surgeon: Waynetta Sandy, MD;  Location: [place];  Service: Vascular;  Laterality: Right;  . LOWER EXTREMITY ANGIOGRAPHY Right 08/17/2017   Procedure: Lower Extremity Angiography;  Surgeon: Waynetta Sandy, MD;  Location: [place] [facility];  Service: Cardiovascular;  Laterality: Right;  . PERIPHERAL VASCULAR BALLOON ANGIOPLASTY Right 08/17/2017   Procedure: PERIPHERAL VASCULAR BALLOON ANGIOPLASTY;  Surgeon: Waynetta Sandy, MD;  Location: [place] [facility];  Service: Cardiovascular;  Laterality: Right;  SFA UNABLE TO CROSS  . VEIN HARVEST Right 08/18/2017   Procedure: VEIN HARVEST RIGHT GREAT SAPHENOUS;  Surgeon: Waynetta Sandy, MD;  Location: [place];  Service: Vascular;  Laterality: Right;  . WOUND DEBRIDEMENT Right 08/18/2017   Procedure: DEBRIDEMENT WOUND RIGHT FOOT;  Surgeon: Waynetta Sandy, MD;  Location: [place];  Service: Vascular;  Laterality: Right;    There were no vitals filed for this visit.    Wound Therapy - 07/28/18 1207    Subjective  Patient arrives when dressing is intact. Patient denies pain and did not note any increase drainage with dressing change.     Patient and Family Stated Goals  wounds to heal    Date of Onset  --   >1 year ago   Prior Treatments  no self care, prior amputations    Pain Scale  0-10    Pain Score  0-No pain    Evaluation and Treatment Procedures Explained to  Patient/Family  Yes    Evaluation and Treatment Procedures  agreed to    Wound Properties Date First Assessed: 05/26/18 Time First Assessed: 1600 Wound Type: Other (Comment) , opening plantar surface  Location: Foot Location Orientation: Right Present on Admission: Yes   Dressing Type  Hydrocolloid;Compression wrap;Gauze (Comment)   duoderm, 2x2, profore lite   Dressing Changed  Changed    Dressing Status  Old drainage    Dressing Change Frequency  PRN    Site / Wound Assessment  Clean;Dry    % Wound base Red or  Granulating  90%   red   % Wound base Yellow/Fibrinous Exudate  5%   in center of wound bed   % Wound base Other/Granulation Tissue (Comment)  5%   pale tissue   Peri-wound Assessment  Intact    Drainage Amount  Minimal    Drainage Description  Serosanguineous    Treatment  Cleansed;Debridement (Selective)    Selective Debridement - Location  plantar foot    Selective Debridement - Tools Used  Forceps;Scalpel    Selective Debridement - Tissue Removed  Slough, dried exudate, devitalized tissue, margins, callous.    Wound Therapy - Clinical Statement  Patient's wound continues to present with granulated red tissue in wound bed. Biofilm present on wound bed today and easily cleansed and debrided. Inferior margin or wound with increased maceration to callous. Callous debrided around entirety of periowound and improved approximation and attachment of wound margins noted along superiomedial wound boarder. Continued with duoderm extra thin and ABD following. Continued with petroleum to periwound to reduce maceration and profore lite to address edema.     Wound Therapy - Functional Problem List  Impared walking/mobility, impaired self care/ADL performance    Factors Delaying/Impairing Wound Healing  Altered sensation;Tobacco use;Vascular compromise    Hydrotherapy Plan  Debridement;Dressing change;Patient/family education;Pulsatile lavage with suction    Wound Therapy - Frequency  2X / week    Wound Therapy - Current Recommendations  PT    Wound Plan  Measure Mondays. Continue with profore lite for edema management. Continue with topical agents and debridement as appropriate for wound healing stage.    Dressing  Plantar foot wound: duoderm extra thin, gauze, profore lite        PT Short Term Goals - 06/21/18 1441      PT SHORT TERM GOAL #1   Title  Patient will have no callous present at base of Rt foot to indicate improve WB tolerance and self care.    Baseline  no callous but wound present     Time  4    Period  Weeks    Status  Partially Met    Target Date  06/21/18      PT SHORT TERM GOAL #2   Title  Wound will reduce by 50% surface area to indicate wound healing and progress towards proliferative stage of healing away from inflammatory stage.    Baseline  achieved for ant foot and medial ankle wounds    Time  4    Period  Weeks    Status  Partially Met        PT Long Term Goals - 07/17/18 1153      PT LONG TERM GOAL #1   Title  Rt medial ankle wound will be healing with 90% reducation in surface area indicating goof approximation of wound with no evidence of imparied periwound integrity.    Time  8    Period  Weeks  Status  Achieved      PT LONG TERM GOAL #2   Title  Rt foot wound will be healing with 90% reducation in surface area indicating goof approximation of wound with no evidence of imparied periwound integrity.    Time  8    Period  Weeks    Status  On-going      PT LONG TERM GOAL #3   Title  Patient will have 75% reduced edema and if appropriate per vascular MD (Possible Dr. Donzetta Matters) patient will be independent in donnig/doffing and carign for compresison garment to manage edema of Rt LE.    Time  8    Period  Weeks    Status  On-going         Plan - 07/28/18 1214    Clinical Impression Statement  see above    Rehab Potential  Fair    Clinical Impairments Affecting Rehab Potential  (-) chronic woudn history, (-) decreaed self care motivation    PT Frequency  2x / week    PT Duration  3 weeks    PT Treatment/Interventions  ADLs/Self Care Home Management;Compression bandaging;Manual techniques;Patient/family education   skilled wound care, selective debridment, compression wrapping   PT Next Visit Plan  see above    Consulted and Agree with Plan of Care  Patient       Patient will benefit from skilled therapeutic intervention in order to improve the following deficits and impairments:  Abnormal gait, Decreased balance, Decreased skin integrity,  Increased edema, Impaired sensation, Decreased mobility, Difficulty walking  Visit Diagnosis: Open wound of right ankle, initial encounter  Open wound of right foot, initial encounter     Problem List Patient Active Problem List   Diagnosis Date Noted  . Status post transmetatarsal amputation of right foot ([place])   . Subacute osteomyelitis, right ankle and foot ([place])   . Infected blister of foot 08/13/2017  . Pressure injury of skin 08/12/2017  . Weakness 08/11/2017  . COPD (chronic obstructive pulmonary disease) ([place]) 08/11/2017  . Cellulitis of right lower extremity 08/11/2017  . Fall at home, initial encounter 08/11/2017  . Thrombocytopenia ([place]) 08/11/2017  . Weakness generalized 08/11/2017  . Hypokalemia 08/11/2017  . Essential hypertension 08/11/2017    Kipp Brood, PT, DPT, [facility] Physical Therapist with [facility]  07/28/2018 12:15 PM    [place] [address] [place], [place], [address] Phone: [phone]   Fax:  [phone]  Name: Jorge Clements MRN: [id number] Date of Birth: 12/30/1945

## 2018-07-31 ENCOUNTER — Other Ambulatory Visit: Payer: Self-pay

## 2018-07-31 ENCOUNTER — Encounter (HOSPITAL_COMMUNITY): Payer: Self-pay

## 2018-07-31 ENCOUNTER — Ambulatory Visit (HOSPITAL_COMMUNITY): Payer: Medicare HMO

## 2018-07-31 DIAGNOSIS — S91001A Unspecified open wound, right ankle, initial encounter: Secondary | ICD-10-CM | POA: Diagnosis not present

## 2018-07-31 DIAGNOSIS — S91301A Unspecified open wound, right foot, initial encounter: Secondary | ICD-10-CM | POA: Diagnosis not present

## 2018-07-31 NOTE — Therapy (Signed)
[facility] [address] [place], [place], [address] Phone: [phone]   Fax:  [phone]  Wound Care Therapy/Progress Note  Patient Details  Name: Jorge Clements MRN: [id number] Date of Birth: [birth year] Referring Provider (PT): Celene Squibb, MD   Encounter Date: 07/31/2018   Progress Note Reporting Period 07/10/18 to 07/31/18  See note below for Objective Data and Assessment of Progress/Goals.     PT End of Session - 07/31/18 1254    Visit Number  24    Number of Visits  28    Date for PT Re-Evaluation  07/19/18   Minireassess 06/21/18   Authorization Type  [employer] medicare PPO ( no auth required, no visit limit)    Authorization Time Period  05/24/2018 - 07/21/2018; 07/17/18-08/07/18    Authorization - Visit Number  1    Authorization - Number of Visits  10    PT Start Time  6812    PT Stop Time  1122    PT Time Calculation (min)  41 min    Activity Tolerance  Patient tolerated treatment well    Behavior During Therapy  WFL for tasks assessed/performed       Past Medical History:  Diagnosis Date  . COPD (chronic obstructive pulmonary disease) ([place])     Past Surgical History:  Procedure Laterality Date  . ABDOMINAL AORTOGRAM N/A 08/17/2017   Procedure: ABDOMINAL AORTOGRAM;  Surgeon: Waynetta Sandy, MD;  Location: [facility];  Service: Cardiovascular;  Laterality: N/A;  . AMPUTATION Right 08/21/2017   Procedure: TRANSMETATARSAL AMPUTATION RIGHT FOOT;  Surgeon: Newt Minion, MD;  Location: [facility];  Service: Orthopedics;  Laterality: Right;  . AMPUTATION TOE Right 08/18/2017   Procedure: AMPUTATION RIGHT SECOND AND THIRD TOES;  Surgeon: Waynetta Sandy, MD;  Location: [facility];  Service: Vascular;  Laterality: Right;  . ENDARTERECTOMY FEMORAL Right 08/18/2017   Procedure: ENDARTERECTOMY RIGHT SUPERFICIAL Jerene Canny;  Surgeon: Waynetta Sandy, MD;  Location: [facility];  Service: Vascular;  Laterality:  Right;  . FEMORAL-POPLITEAL BYPASS GRAFT Right 08/18/2017   Procedure: BYPASS GRAFT RIGHT COMMON FEMORAL TO ABOVE KNEE POPLITEAL ARTERY USING RIGHT REVERSED GREAT SAPHENOUS VEIN;  Surgeon: Waynetta Sandy, MD;  Location: [facility];  Service: Vascular;  Laterality: Right;  . LOWER EXTREMITY ANGIOGRAPHY Right 08/17/2017   Procedure: Lower Extremity Angiography;  Surgeon: Waynetta Sandy, MD;  Location: [facility];  Service: Cardiovascular;  Laterality: Right;  . PERIPHERAL VASCULAR BALLOON ANGIOPLASTY Right 08/17/2017   Procedure: PERIPHERAL VASCULAR BALLOON ANGIOPLASTY;  Surgeon: Waynetta Sandy, MD;  Location: [facility];  Service: Cardiovascular;  Laterality: Right;  SFA UNABLE TO CROSS  . VEIN HARVEST Right 08/18/2017   Procedure: VEIN HARVEST RIGHT GREAT SAPHENOUS;  Surgeon: Waynetta Sandy, MD;  Location: [facility];  Service: Vascular;  Laterality: Right;  . WOUND DEBRIDEMENT Right 08/18/2017   Procedure: DEBRIDEMENT WOUND RIGHT FOOT;  Surgeon: Waynetta Sandy, MD;  Location: [facility];  Service: Vascular;  Laterality: Right;    There were no vitals filed for this visit.   Wound Therapy - 07/31/18 1243    Subjective  Patient arrives with dressing intact and denies pain. He reports he had touble getting his prescriptions filled but whe he went to PCP last week they refilled them for him.     Patient and Family Stated Goals  wounds to heal    Date of Onset  --   >1 year ago  Prior Treatments  no self care, prior amputations    Pain Scale  0-10    Pain Score  0-No pain    Evaluation and Treatment Procedures Explained to Patient/Family  Yes    Evaluation and Treatment Procedures  agreed to    Wound Properties Date First Assessed: 05/26/18 Time First Assessed: 1600 Wound Type: Other (Comment) , opening plantar surface  Location: Foot Location Orientation: Right Present on Admission: Yes   Dressing Type  Hydrocolloid;Compression wrap;Gauze  (Comment)   duoderm, 2x2, profore lite   Dressing Changed  Changed    Dressing Status  Old drainage    Dressing Change Frequency  PRN    Site / Wound Assessment  Clean;Dry    % Wound base Red or Granulating  90%   red   % Wound base Yellow/Fibrinous Exudate  5%   in center of wound bed   % Wound base Other/Granulation Tissue (Comment)  5%   pale tissue   Peri-wound Assessment  Maceration    Wound Length (cm)  1.8 cm   1.8   Wound Width (cm)  2 cm    Wound Depth (cm)  0.3 cm   0.3   Wound Volume (cm^3)  1.08 cm^3    Wound Surface Area (cm^2)  3.6 cm^2    Drainage Amount  Minimal    Drainage Description  Serosanguineous    Treatment  Cleansed;Debridement (Selective)    Selective Debridement - Location  plantar foot    Selective Debridement - Tools Used  Forceps;Scissors    Selective Debridement - Tissue Removed  Slough, dried exudate, devitalized tissue, margins, callous.    Wound Therapy - Clinical Statement  Patient's wound has reduced in size gradually and there is improved approximation of wound bed to margin along superiomedial boarder. Patient has increased maceration of periwound and callous along inferior wound margin. Continued with selective debridement to promote approximation of wound bed to margin. Hypergranulation tissue remains present in inferior portion of wound bed. Continued with duoderm extra thin and ABD following. Will contact patient PCP about concerns of hypergranulation tissue and to transition back to moisture absorbing dressing. Continued with petroleum to periwound to reduce maceration and profore lite to address edema.    Wound Therapy - Functional Problem List  Impared walking/mobility, impaired self care/ADL performance    Factors Delaying/Impairing Wound Healing  Altered sensation;Tobacco use;Vascular compromise    Hydrotherapy Plan  Debridement;Dressing change;Patient/family education;Pulsatile lavage with suction    Wound Therapy - Frequency  2X / week     Wound Therapy - Current Recommendations  PT    Wound Plan  Measure Mondays. Continue with profore lite for edema management. Continue with topical agents and debridement as appropriate for wound healing stage.    Dressing  Plantar foot wound: duoderm extra thin, gauze, profore lite        PT Short Term Goals - 06/21/18 1441      PT SHORT TERM GOAL #1   Title  Patient will have no callous present at base of Rt foot to indicate improve WB tolerance and self care.    Baseline  no callous but wound present    Time  4    Period  Weeks    Status  Partially Met    Target Date  06/21/18      PT SHORT TERM GOAL #2   Title  Wound will reduce by 50% surface area to indicate wound healing and progress towards proliferative stage of healing away  from inflammatory stage.    Baseline  achieved for ant foot and medial ankle wounds    Time  4    Period  Weeks    Status  Partially Met        PT Long Term Goals - 07/17/18 1153      PT LONG TERM GOAL #1   Title  Rt medial ankle wound will be healing with 90% reducation in surface area indicating goof approximation of wound with no evidence of imparied periwound integrity.    Time  8    Period  Weeks    Status  Achieved      PT LONG TERM GOAL #2   Title  Rt foot wound will be healing with 90% reducation in surface area indicating goof approximation of wound with no evidence of imparied periwound integrity.    Time  8    Period  Weeks    Status  On-going      PT LONG TERM GOAL #3   Title  Patient will have 75% reduced edema and if appropriate per vascular MD (Possible Dr. Donzetta Matters) patient will be independent in donnig/doffing and carign for compresison garment to manage edema of Rt LE.    Time  8    Period  Weeks    Status  On-going       Plan - 07/31/18 1254    Clinical Impression Statement  see above    Rehab Potential  Fair    Clinical Impairments Affecting Rehab Potential  (-) chronic woudn history, (-) decreaed self care motivation     PT Frequency  2x / week    PT Duration  3 weeks    PT Treatment/Interventions  ADLs/Self Care Home Management;Compression bandaging;Manual techniques;Patient/family education   skilled wound care, selective debridment, compression wrapping   PT Next Visit Plan  see above    Consulted and Agree with Plan of Care  Patient       Patient will benefit from skilled therapeutic intervention in order to improve the following deficits and impairments:  Abnormal gait, Decreased balance, Decreased skin integrity, Increased edema, Impaired sensation, Decreased mobility, Difficulty walking  Visit Diagnosis: Open wound of right ankle, initial encounter  Open wound of right foot, initial encounter     Problem List Patient Active Problem List   Diagnosis Date Noted  . Status post transmetatarsal amputation of right foot ([place])   . Subacute osteomyelitis, right ankle and foot ([place])   . Infected blister of foot 08/13/2017  . Pressure injury of skin 08/12/2017  . Weakness 08/11/2017  . COPD (chronic obstructive pulmonary disease) ([place]) 08/11/2017  . Cellulitis of right lower extremity 08/11/2017  . Fall at home, initial encounter 08/11/2017  . Thrombocytopenia ([place]) 08/11/2017  . Weakness generalized 08/11/2017  . Hypokalemia 08/11/2017  . Essential hypertension 08/11/2017    Kipp Brood, PT, DPT, [facility] Physical Therapist with [facility]  07/31/2018 12:56 PM    [facility] [address] [place], [place], [address] Phone: [phone]   Fax:  [phone]  Name: Jorge Clements MRN: [id number] Date of Birth: 08-01-1945

## 2018-08-01 DIAGNOSIS — Z0000 Encounter for general adult medical examination without abnormal findings: Secondary | ICD-10-CM | POA: Diagnosis not present

## 2018-08-02 ENCOUNTER — Ambulatory Visit (HOSPITAL_COMMUNITY): Payer: Medicare HMO

## 2018-08-04 ENCOUNTER — Other Ambulatory Visit: Payer: Self-pay

## 2018-08-04 ENCOUNTER — Ambulatory Visit (HOSPITAL_COMMUNITY): Payer: Medicare HMO

## 2018-08-04 ENCOUNTER — Encounter (HOSPITAL_COMMUNITY): Payer: Self-pay

## 2018-08-04 DIAGNOSIS — S91301A Unspecified open wound, right foot, initial encounter: Secondary | ICD-10-CM | POA: Diagnosis not present

## 2018-08-04 DIAGNOSIS — S91001A Unspecified open wound, right ankle, initial encounter: Secondary | ICD-10-CM

## 2018-08-04 NOTE — Therapy (Signed)
[facility] [place], [place], [address] Phone: [phone]   Fax:  [phone]  Wound Care Therapy  Patient Details  Name: Jorge Clements MRN: [id number] Date of Birth: 1946/03/18 Referring Provider (PT): Celene Squibb, MD   Encounter Date: 08/04/2018  PT End of Session - 08/04/18 1220    Visit Number  25    Number of Visits  28    Date for PT Re-Evaluation  07/19/18   Minireassess 06/21/18   Authorization Type  [employer] medicare PPO ( no auth required, no visit limit)    Authorization Time Period  05/24/2018 - 07/21/2018; 07/17/18-08/07/18    Authorization - Visit Number  2    Authorization - Number of Visits  10    PT Start Time  1033    PT Stop Time  1128    PT Time Calculation (min)  55 min    Activity Tolerance  Patient tolerated treatment well    Behavior During Therapy  [facility] for tasks assessed/performed       Past Medical History:  Diagnosis Date  . COPD (chronic obstructive pulmonary disease) ([place])     Past Surgical History:  Procedure Laterality Date  . ABDOMINAL AORTOGRAM N/A 08/17/2017   Procedure: ABDOMINAL AORTOGRAM;  Surgeon: Waynetta Sandy, MD;  Location: [facility];  Service: Cardiovascular;  Laterality: N/A;  . AMPUTATION Right 08/21/2017   Procedure: TRANSMETATARSAL AMPUTATION RIGHT FOOT;  Surgeon: Newt Minion, MD;  Location: [facility];  Service: Orthopedics;  Laterality: Right;  . AMPUTATION TOE Right 08/18/2017   Procedure: AMPUTATION RIGHT SECOND AND THIRD TOES;  Surgeon: Waynetta Sandy, MD;  Location: [facility];  Service: Vascular;  Laterality: Right;  . ENDARTERECTOMY FEMORAL Right 08/18/2017   Procedure: ENDARTERECTOMY RIGHT SUPERFICIAL Jerene Canny;  Surgeon: Waynetta Sandy, MD;  Location: [facility];  Service: Vascular;  Laterality: Right;  . FEMORAL-POPLITEAL BYPASS GRAFT Right 08/18/2017   Procedure: BYPASS GRAFT RIGHT COMMON FEMORAL TO ABOVE KNEE POPLITEAL ARTERY USING RIGHT  REVERSED GREAT SAPHENOUS VEIN;  Surgeon: Waynetta Sandy, MD;  Location: [facility];  Service: Vascular;  Laterality: Right;  . LOWER EXTREMITY ANGIOGRAPHY Right 08/17/2017   Procedure: Lower Extremity Angiography;  Surgeon: Waynetta Sandy, MD;  Location: [facility];  Service: Cardiovascular;  Laterality: Right;  . PERIPHERAL VASCULAR BALLOON ANGIOPLASTY Right 08/17/2017   Procedure: PERIPHERAL VASCULAR BALLOON ANGIOPLASTY;  Surgeon: Waynetta Sandy, MD;  Location: [facility];  Service: Cardiovascular;  Laterality: Right;  SFA UNABLE TO CROSS  . VEIN HARVEST Right 08/18/2017   Procedure: VEIN HARVEST RIGHT GREAT SAPHENOUS;  Surgeon: Waynetta Sandy, MD;  Location: [facility];  Service: Vascular;  Laterality: Right;  . WOUND DEBRIDEMENT Right 08/18/2017   Procedure: DEBRIDEMENT WOUND RIGHT FOOT;  Surgeon: Waynetta Sandy, MD;  Location: [facility];  Service: Vascular;  Laterality: Right;    There were no vitals filed for this visit.     Wound Therapy - 08/04/18 1208    Subjective  Patient arrives with dressing intact and denies pain. He reports his appointment with his vascular MD is now next Thursday 08/10/18 and he would like [place] to call to make sure they can remove the dressing to perform his test.     Patient and Family Stated Goals  wounds to heal    Date of Onset  --   >1 year ago   Prior Treatments  no self care, prior amputations  Pain Scale  0-10    Pain Score  0-No pain    Evaluation and Treatment Procedures Explained to Patient/Family  Yes    Evaluation and Treatment Procedures  agreed to    Wound Properties Date First Assessed: 05/26/18 Time First Assessed: 1600 Wound Type: Other (Comment) , opening plantar surface  Location: Foot Location Orientation: Right Present on Admission: Yes   Dressing Type  Hydrocolloid;Compression wrap;Gauze (Comment)   duoderm, 2x2, profore lite   Dressing Changed  Changed    Dressing Status  Old  drainage    Dressing Change Frequency  PRN    Site / Wound Assessment  Clean    % Wound base Red or Granulating  90%   red   % Wound base Yellow/Fibrinous Exudate  5%   in center of wound bed   % Wound base Other/Granulation Tissue (Comment)  5%   pale tissue   Peri-wound Assessment  Maceration    Wound Length (cm)  1.8 cm   1.8   Wound Width (cm)  1.4 cm   2   Wound Depth (cm)  0.3 cm   0.3   Wound Volume (cm^3)  0.76 cm^3    Wound Surface Area (cm^2)  2.52 cm^2    Drainage Amount  Minimal    Drainage Description  Serosanguineous    Treatment  Cleansed;Debridement (Selective)    Selective Debridement - Location  plantar foot    Selective Debridement - Tools Used  Forceps;Scissors    Selective Debridement - Tissue Removed  Slough, dried exudate, devitalized tissue, margins, callous.    Wound Therapy - Clinical Statement  Patients wound has reduced slightly in size and there continues to be improved approximation of superior wound margins to wound bed. He continues to have slightly increased maceration along inferior periwound and inferior wound margin that has calloused. Wound bed remains elevated and hypergranulated primarily at inferior half of wound. Continued with selective debridement to promote approximation of wound bed to margin. Continued with duoderm extra thin followed by alginate and ABD to address moisture. Petroleum to periwound to reduce maceration and profore lite to address edema. Pictures were taken today per request of PCP and will be routed to MD with brief assessment. Patient consented to pictures.    Wound Therapy - Functional Problem List  Impared walking/mobility, impaired self care/ADL performance    Factors Delaying/Impairing Wound Healing  Altered sensation;Tobacco use;Vascular compromise    Hydrotherapy Plan  Debridement;Dressing change;Patient/family education;Pulsatile lavage with suction    Wound Therapy - Frequency  2X / week    Wound Therapy - Current  Recommendations  PT    Wound Plan  Measure Mondays. Continue with profore lite for edema management. Continue with topical agents and debridement as appropriate for wound healing stage.    Dressing  Plantar foot wound: duoderm extra thin, alginate, ABD, gauze, profore lite        PT Short Term Goals - 06/21/18 1441      PT SHORT TERM GOAL #1   Title  Patient will have no callous present at base of Rt foot to indicate improve WB tolerance and self care.    Baseline  no callous but wound present    Time  4    Period  Weeks    Status  Partially Met    Target Date  06/21/18      PT SHORT TERM GOAL #2   Title  Wound will reduce by 50% surface area to indicate wound healing  and progress towards proliferative stage of healing away from inflammatory stage.    Baseline  achieved for ant foot and medial ankle wounds    Time  4    Period  Weeks    Status  Partially Met        PT Long Term Goals - 07/17/18 1153      PT LONG TERM GOAL #1   Title  Rt medial ankle wound will be healing with 90% reducation in surface area indicating goof approximation of wound with no evidence of imparied periwound integrity.    Time  8    Period  Weeks    Status  Achieved      PT LONG TERM GOAL #2   Title  Rt foot wound will be healing with 90% reducation in surface area indicating goof approximation of wound with no evidence of imparied periwound integrity.    Time  8    Period  Weeks    Status  On-going      PT LONG TERM GOAL #3   Title  Patient will have 75% reduced edema and if appropriate per vascular MD (Possible Dr. Donzetta Matters) patient will be independent in donnig/doffing and carign for compresison garment to manage edema of Rt LE.    Time  8    Period  Weeks    Status  On-going       Plan - 08/04/18 1221    Clinical Impression Statement  see above    Rehab Potential  Fair    Clinical Impairments Affecting Rehab Potential  (-) chronic woudn history, (-) decreaed self care motivation    PT  Frequency  2x / week    PT Duration  3 weeks    PT Treatment/Interventions  ADLs/Self Care Home Management;Compression bandaging;Manual techniques;Patient/family education   skilled wound care, selective debridment, compression wrapping   PT Next Visit Plan  see above    Consulted and Agree with Plan of Care  Patient       Patient will benefit from skilled therapeutic intervention in order to improve the following deficits and impairments:  Abnormal gait, Decreased balance, Decreased skin integrity, Increased edema, Impaired sensation, Decreased mobility, Difficulty walking  Visit Diagnosis: Open wound of right ankle, initial encounter  Open wound of right foot, initial encounter     Problem List Patient Active Problem List   Diagnosis Date Noted  . Status post transmetatarsal amputation of right foot ([place])   . Subacute osteomyelitis, right ankle and foot ([place])   . Infected blister of foot 08/13/2017  . Pressure injury of skin 08/12/2017  . Weakness 08/11/2017  . COPD (chronic obstructive pulmonary disease) ([place]) 08/11/2017  . Cellulitis of right lower extremity 08/11/2017  . Fall at home, initial encounter 08/11/2017  . Thrombocytopenia ([place]) 08/11/2017  . Weakness generalized 08/11/2017  . Hypokalemia 08/11/2017  . Essential hypertension 08/11/2017    Kipp Brood, PT, DPT, [facility] Physical Therapist with [facility]  08/04/2018 12:21 PM    [facility] [address] [place], [place], [address] Phone: [phone]   Fax:  [phone]  Name: Jorge Clements MRN: [id number] Date of Birth: 23-Sep-1945

## 2018-08-07 ENCOUNTER — Other Ambulatory Visit: Payer: Self-pay

## 2018-08-07 ENCOUNTER — Ambulatory Visit (HOSPITAL_COMMUNITY): Payer: Medicare HMO

## 2018-08-07 ENCOUNTER — Encounter (HOSPITAL_COMMUNITY): Payer: Self-pay

## 2018-08-07 DIAGNOSIS — S91301A Unspecified open wound, right foot, initial encounter: Secondary | ICD-10-CM | POA: Diagnosis not present

## 2018-08-07 DIAGNOSIS — S91001A Unspecified open wound, right ankle, initial encounter: Secondary | ICD-10-CM | POA: Diagnosis not present

## 2018-08-07 NOTE — Therapy (Signed)
[facility] [address] [place], [place], [address] Phone: [phone]   Fax:  [phone]  Wound Care Therapy/Progress Note  Patient Details  Name: Jorge Clements MRN: [id number] Date of Birth: 09/30/1945 Referring Provider (PT): Celene Squibb, MD   Encounter Date: 08/07/2018   Progress Note Reporting Period 07/31/18 to 08/07/18  See note below for Objective Data and Assessment of Progress/Goals.    PT End of Session - 08/07/18 1417    Visit Number  26    Number of Visits  36    Date for PT Re-Evaluation  07/19/18   Minireassess 06/21/18   Authorization Type  [employer] medicare PPO ( no auth required, no visit limit)    Authorization Time Period  05/24/2018 - 07/21/2018; 07/17/18-08/07/18; 08/07/18-09/08/18    Authorization - Visit Number  1    Authorization - Number of Visits  10    PT Start Time  1040    PT Stop Time  1120    PT Time Calculation (min)  40 min    Activity Tolerance  Patient tolerated treatment well    Behavior During Therapy  WFL for tasks assessed/performed       Past Medical History:  Diagnosis Date  . COPD (chronic obstructive pulmonary disease) ([place])     Past Surgical History:  Procedure Laterality Date  . ABDOMINAL AORTOGRAM N/A 08/17/2017   Procedure: ABDOMINAL AORTOGRAM;  Surgeon: Waynetta Sandy, MD;  Location: [facility];  Service: Cardiovascular;  Laterality: N/A;  . AMPUTATION Right 08/21/2017   Procedure: TRANSMETATARSAL AMPUTATION RIGHT FOOT;  Surgeon: Newt Minion, MD;  Location: [facility];  Service: Orthopedics;  Laterality: Right;  . AMPUTATION TOE Right 08/18/2017   Procedure: AMPUTATION RIGHT SECOND AND THIRD TOES;  Surgeon: Waynetta Sandy, MD;  Location: [facility];  Service: Vascular;  Laterality: Right;  . ENDARTERECTOMY FEMORAL Right 08/18/2017   Procedure: ENDARTERECTOMY RIGHT SUPERFICIAL Jerene Canny;  Surgeon: Waynetta Sandy, MD;  Location: [facility];  Service: Vascular;   Laterality: Right;  . FEMORAL-POPLITEAL BYPASS GRAFT Right 08/18/2017   Procedure: BYPASS GRAFT RIGHT COMMON FEMORAL TO ABOVE KNEE POPLITEAL ARTERY USING RIGHT REVERSED GREAT SAPHENOUS VEIN;  Surgeon: Waynetta Sandy, MD;  Location: [facility];  Service: Vascular;  Laterality: Right;  . LOWER EXTREMITY ANGIOGRAPHY Right 08/17/2017   Procedure: Lower Extremity Angiography;  Surgeon: Waynetta Sandy, MD;  Location: [facility];  Service: Cardiovascular;  Laterality: Right;  . PERIPHERAL VASCULAR BALLOON ANGIOPLASTY Right 08/17/2017   Procedure: PERIPHERAL VASCULAR BALLOON ANGIOPLASTY;  Surgeon: Waynetta Sandy, MD;  Location: [facility];  Service: Cardiovascular;  Laterality: Right;  SFA UNABLE TO CROSS  . VEIN HARVEST Right 08/18/2017   Procedure: VEIN HARVEST RIGHT GREAT SAPHENOUS;  Surgeon: Waynetta Sandy, MD;  Location: [facility];  Service: Vascular;  Laterality: Right;  . WOUND DEBRIDEMENT Right 08/18/2017   Procedure: DEBRIDEMENT WOUND RIGHT FOOT;  Surgeon: Waynetta Sandy, MD;  Location: [facility];  Service: Vascular;  Laterality: Right;    There were no vitals filed for this visit.              Wound Therapy - 08/07/18 1406    Subjective  Patient arrives with no complaints. He is relieved to learn his MD can remove the dressing and re-dress the wound when he visits his vascular surgeon this Thursday.     Patient and Family Stated Goals  wounds to heal    Date  of Onset  --   >1 year ago   Prior Treatments  no self care, prior amputations    Pain Scale  0-10    Pain Score  0-No pain    Evaluation and Treatment Procedures Explained to Patient/Family  Yes    Evaluation and Treatment Procedures  agreed to    Wound Properties Date First Assessed: 05/26/18 Time First Assessed: 1600 Wound Type: Other (Comment) , opening plantar surface  Location: Foot Location Orientation: Right Present on Admission: Yes   Dressing Type   Hydrocolloid;Compression wrap;Gauze (Comment)   duoderm, 2x2, profore lite   Dressing Changed  Changed    Dressing Status  Old drainage    Dressing Change Frequency  PRN    Site / Wound Assessment  Clean    % Wound base Red or Granulating  90%   red   % Wound base Yellow/Fibrinous Exudate  5%   in center of wound bed   % Wound base Other/Granulation Tissue (Comment)  5%   pale tissue   Peri-wound Assessment  Maceration    Wound Length (cm)  1.8 cm   1.8   Wound Width (cm)  1.4 cm   1.4   Wound Depth (cm)  -0.2 cm    Wound Volume (cm^3)  -0.5 cm^3    Wound Surface Area (cm^2)  2.52 cm^2    Drainage Amount  Minimal    Drainage Description  Serosanguineous    Treatment  Cleansed;Debridement (Selective)    Selective Debridement - Location  plantar foot    Selective Debridement - Tools Used  Forceps;Scissors    Selective Debridement - Tissue Removed  Slough, dried exudate, devitalized tissue, margins, callous.    Wound Therapy - Clinical Statement  Patient's wound presents with much improved approximation of wound margins and wound bed around superior half of wound. Less maceration is present along inferior periwound and there is no increase in callous around inferior margin. Wound bed remains elevated especially along distal portion of wound however appears less raised than prior session. Wound appears to be continuing to marign to wound bed flap. Continue Continued with selective debridement to maintain open margin and promote approximation. Continued with duoderm extra thin followed by alginate and ABD to address moisture. Petroleum to periwound to reduce maceration and profore lite to address edema. d has reduced slightly in size and there continues to be improved approximation of superior wound margins to wound bed. He continues to have slightly increased maceration along inferior periwound and inferior wound margin that has calloused. Wound bed remains elevated and hypergranulated  primarily at inferior half of wound. Continued with selective debridement to promote approximation of wound bed to margin. Continued with duoderm extra thin followed by alginate and ABD to address moisture. Petroleum to periwound to reduce maceration and profore lite to address edema. Pictures were taken today per request of PCP and will be routed to MD with brief assessment. Patient consented to pictures.    Wound Therapy - Functional Problem List  Impared walking/mobility, impaired self care/ADL performance    Factors Delaying/Impairing Wound Healing  Altered sensation;Tobacco use;Vascular compromise    Hydrotherapy Plan  Debridement;Dressing change;Patient/family education;Pulsatile lavage with suction    Wound Therapy - Frequency  2X / week    Wound Therapy - Current Recommendations  PT    Wound Plan  Measure Mondays. Continue with profore lite for edema management. Continue with topical agents and debridement as appropriate for wound healing stage.    Dressing  Plantar foot wound: duoderm extra thin, alginate, ABD, gauze, profore lite                PT Short Term Goals - 06/21/18 1441      PT SHORT TERM GOAL #1   Title  Patient will have no callous present at base of Rt foot to indicate improve WB tolerance and self care.    Baseline  no callous but wound present    Time  4    Period  Weeks    Status  Partially Met    Target Date  06/21/18      PT SHORT TERM GOAL #2   Title  Wound will reduce by 50% surface area to indicate wound healing and progress towards proliferative stage of healing away from inflammatory stage.    Baseline  achieved for ant foot and medial ankle wounds    Time  4    Period  Weeks    Status  Partially Met        PT Long Term Goals - 07/17/18 1153      PT LONG TERM GOAL #1   Title  Rt medial ankle wound will be healing with 90% reducation in surface area indicating goof approximation of wound with no evidence of imparied periwound integrity.     Time  8    Period  Weeks    Status  Achieved      PT LONG TERM GOAL #2   Title  Rt foot wound will be healing with 90% reducation in surface area indicating goof approximation of wound with no evidence of imparied periwound integrity.    Time  8    Period  Weeks    Status  On-going      PT LONG TERM GOAL #3   Title  Patient will have 75% reduced edema and if appropriate per vascular MD (Possible Dr. Donzetta Matters) patient will be independent in donnig/doffing and carign for compresison garment to manage edema of Rt LE.    Time  8    Period  Weeks    Status  On-going            Plan - 08/07/18 1418    Clinical Impression Statement  see above    Rehab Potential  Fair    Clinical Impairments Affecting Rehab Potential  (-) chronic woudn history, (-) decreaed self care motivation    PT Frequency  2x / week    PT Duration  3 weeks    PT Treatment/Interventions  ADLs/Self Care Home Management;Compression bandaging;Manual techniques;Patient/family education   skilled wound care, selective debridment, compression wrapping   PT Next Visit Plan  see above    Consulted and Agree with Plan of Care  Patient       Patient will benefit from skilled therapeutic intervention in order to improve the following deficits and impairments:  Abnormal gait, Decreased balance, Decreased skin integrity, Increased edema, Impaired sensation, Decreased mobility, Difficulty walking  Visit Diagnosis: Open wound of right ankle, initial encounter  Open wound of right foot, initial encounter     Problem List Patient Active Problem List   Diagnosis Date Noted  . Status post transmetatarsal amputation of right foot ([place])   . Subacute osteomyelitis, right ankle and foot ([place])   . Infected blister of foot 08/13/2017  . Pressure injury of skin 08/12/2017  . Weakness 08/11/2017  . COPD (chronic obstructive pulmonary disease) ([place]) 08/11/2017  . Cellulitis of right lower extremity 08/11/2017  .  Fall at home,  initial encounter 08/11/2017  . Thrombocytopenia ([place]) 08/11/2017  . Weakness generalized 08/11/2017  . Hypokalemia 08/11/2017  . Essential hypertension 08/11/2017    Kipp Brood, PT, DPT, [facility] Physical Therapist with [facility]  08/07/2018 2:20 PM    [facility] [address] [place], [place], [address] Phone: [phone]   Fax:  [phone]  Name: Lemonte Al MRN: [id number] Date of Birth: 1946/03/16

## 2018-08-09 ENCOUNTER — Telehealth (HOSPITAL_COMMUNITY): Payer: Self-pay | Admitting: Rehabilitation

## 2018-08-09 NOTE — Telephone Encounter (Signed)
The above patient or their representative was contacted and gave the following answers to these questions:         Do you have any of the following symptoms? No  Fever                    Cough                   Shortness of breath  Do  you have any of the following other symptoms? No   muscle pain         vomiting,        diarrhea        rash         weakness        red eye        abdominal pain         bruising          bruising or bleeding              joint pain           severe headache    Have you been in contact with someone who was or has been sick in the past 2 weeks? No  Yes                 Unsure                         Unable to assess   Does the person that you were in contact with have any of the following symptoms?   Cough         shortness of breath           muscle pain         vomiting,            diarrhea            rash            weakness           fever            red eye           abdominal pain           bruising  or  bleeding                joint pain                severe headache               Have you  or someone you have been in contact with traveled internationally in th last month? No        If yes, which countries?   Have you  or someone you have been in contact with traveled outside [place] in th last month? No         If yes, which state and city?   COMMENTS OR ACTION PLAN FOR THIS PATIENT:          

## 2018-08-10 ENCOUNTER — Other Ambulatory Visit: Payer: Self-pay

## 2018-08-10 ENCOUNTER — Ambulatory Visit: Payer: Medicare HMO | Admitting: Family

## 2018-08-10 ENCOUNTER — Ambulatory Visit (HOSPITAL_COMMUNITY)
Admission: RE | Admit: 2018-08-10 | Discharge: 2018-08-10 | Disposition: A | Discharge status: Home / Self Care | Payer: Medicare HMO | Source: Ambulatory Visit | Attending: Vascular Surgery | Admitting: Vascular Surgery

## 2018-08-10 ENCOUNTER — Encounter: Payer: Self-pay | Admitting: Family

## 2018-08-10 VITALS — BP 134/68 | HR 67 | Temp 97.0°F | Resp 18 | Ht 72.0 in | Wt 250.0 lb

## 2018-08-10 DIAGNOSIS — I779 Disorder of arteries and arterioles, unspecified: Secondary | ICD-10-CM | POA: Diagnosis not present

## 2018-08-10 DIAGNOSIS — Z89431 Acquired absence of right foot: Secondary | ICD-10-CM

## 2018-08-10 DIAGNOSIS — Z9889 Other specified postprocedural states: Secondary | ICD-10-CM

## 2018-08-10 DIAGNOSIS — Z95820 Peripheral vascular angioplasty status with implants and grafts: Secondary | ICD-10-CM

## 2018-08-10 DIAGNOSIS — I739 Peripheral vascular disease, unspecified: Secondary | ICD-10-CM | POA: Insufficient documentation

## 2018-08-10 DIAGNOSIS — Z87891 Personal history of nicotine dependence: Secondary | ICD-10-CM | POA: Diagnosis not present

## 2018-08-10 NOTE — Patient Instructions (Signed)
Peripheral Vascular Disease  Peripheral vascular disease (PVD) is a disease of the blood vessels that are not part of your heart and brain. A simple term for PVD is poor circulation. In most cases, PVD narrows the blood vessels that carry blood from your heart to the rest of your body. This can reduce the supply of blood to your arms, legs, and internal organs, like your stomach or kidneys. However, PVD most often affects a person's lower legs and feet. Without treatment, PVD tends to get worse. PVD can also lead to acute ischemic limb. This is when an arm or leg suddenly cannot get enough blood. This is a medical emergency. Follow these instructions at home: Lifestyle  Do not use any products that contain nicotine or tobacco, such as cigarettes and e-cigarettes. If you need help quitting, ask your doctor.  Lose weight if you are overweight. Or, stay at a healthy weight as told by your doctor.  Eat a diet that is low in fat and cholesterol. If you need help, ask your doctor.  Exercise regularly. Ask your doctor for activities that are right for you. General instructions  Take over-the-counter and prescription medicines only as told by your doctor.  Take good care of your feet: ? Wear comfortable shoes that fit well. ? Check your feet often for any cuts or sores.  Keep all follow-up visits as told by your doctor This is important. Contact a doctor if:  You have cramps in your legs when you walk.  You have leg pain when you are at rest.  You have coldness in a leg or foot.  Your skin changes.  You are unable to get or have an erection (erectile dysfunction).  You have cuts or sores on your feet that do not heal. Get help right away if:  Your arm or leg turns cold, numb, and blue.  Your arms or legs become red, warm, swollen, painful, or numb.  You have chest pain.  You have trouble breathing.  You suddenly have weakness in your face, arm, or leg.  You become very  confused or you cannot speak.  You suddenly have a very bad headache.  You suddenly cannot see. Summary  Peripheral vascular disease (PVD) is a disease of the blood vessels.  A simple term for PVD is poor circulation. Without treatment, PVD tends to get worse.  Treatment may include exercise, low fat and low cholesterol diet, and quitting smoking. This information is not intended to replace advice given to you by your health care provider. Make sure you discuss any questions you have with your health care provider. Document Released: 06/30/2009 Document Revised: 05/13/2016 Document Reviewed: 05/13/2016 Elsevier Interactive Patient Education  2019 [employer]  

## 2018-08-10 NOTE — Progress Notes (Signed)
[facility] [place]   CC: Follow up peripheral artery occlusive disease  History of Present Illness Jorge KusterMichael Clements is a 73 y.o. male who has a history of bilateral lower extremity stenting in May of 2019.  He then underwent right femoral to above-knee popliteal bypass graft by Dr. Randie Heinzain and right TMA by Dr. Lajoyce Cornersuda on 08-18-17.  In follow-up he had significant swelling of his right lower extremity DVT study was negative.    He presented on 12-30-17 with right lower extremity bypass graft duplex.  He was walking with the help of a cane.  He still had swelling of his leg with some venous changes but no ulceration.  He was getting around fine.  He quit drinking and smoking  November 2018, he did this cold [place].  Overall he was doing well.  He had no left lower extremity complaints.  He has never had stroke TIA or amaurosis.  He is taking aspirin and Crestor daily.  Dr. Randie Heinzain last evaluated pt on 12-30-17. At that time ABIs were 0.87 right and 0.68 left with toe pressure on the left 64.  All signals were monophasic.  His bypass graft duplex was also interpreted which demonstrated a focal elevation of velocity 380 but distally there were no low velocities to suggest impending graft failure.  He had persistent swelling of the right lower extremity. Dr. Randie Heinzain recommended gentle compression of the right lower extremity given his ABI of 0.8. Pt was to follow-up in 6 months with repeat ABIs.  He does have a stent in his left SFA that will need to be evaluated at some point in time as well.  Overall he was doing well without complication other than swelling at that time.  The schedule indicates that pt returns today at the request of Jorge Clements, pt PCP, no notes to substantiate this, with right foot wound. However, pt states that this is his routine scheduled follow up, and that his right foot wound is healing slowly with wound care in [place] 3x/week.   Pt states the hole in his right foot (base  of right 1st metatarsal) is almost healed. Pt states the wound care center preferred that we not remove the dressing, he will be back at the wound care center tomorrow.  Pt states the wound is still draining.  Pt denies fever or chills.  He states he is walking some, states his wound care specialist advised him to walk as little as possible in order for the wound on the plantar aspect of his right foot to heal.    Diabetic: No Tobacco use: former smoker, quit in 2015, smoked 50+ years, up to 3 ppd  Pt meds include: Statin :Yes Betablocker: No ASA: Yes, 325 mg daily Other anticoagulants/antiplatelets: no  Past Medical History:  Diagnosis Date  . COPD (chronic obstructive pulmonary disease) (HCC)     Social History Social History   Tobacco Use  . Smoking status: Former Games developermoker  . Smokeless tobacco: Never Used  Substance Use Topics  . Alcohol use: Not Currently  . Drug use: No    Family History Family History  Adopted: Yes    Past Surgical History:  Procedure Laterality Date  . ABDOMINAL AORTOGRAM N/A 08/17/2017   Procedure: ABDOMINAL AORTOGRAM;  Surgeon: Maeola Harmanain, Brandon Christopher, MD;  [facility];  Service: Cardiovascular;  Laterality: N/A;  . AMPUTATION Right 08/21/2017   Procedure: TRANSMETATARSAL AMPUTATION RIGHT FOOT;  Surgeon: Nadara Mustarduda, Marcus V, MD;  Location: [facility] OR;  Service: Orthopedics;  Laterality: Right;  . AMPUTATION TOE Right 08/18/2017   Procedure: AMPUTATION RIGHT SECOND AND THIRD TOES;  Surgeon: Maeola Harman, MD;  Location: [facility] OR;  Service: Vascular;  Laterality: Right;  . ENDARTERECTOMY FEMORAL Right 08/18/2017   Procedure: ENDARTERECTOMY RIGHT SUPERFICIAL Alfredia Ferguson;  Surgeon: Maeola Harman, MD;  Location: [facility] OR;  Service: Vascular;  Laterality: Right;  . FEMORAL-POPLITEAL BYPASS GRAFT Right 08/18/2017   Procedure: BYPASS GRAFT RIGHT COMMON FEMORAL TO ABOVE KNEE POPLITEAL ARTERY USING RIGHT REVERSED GREAT SAPHENOUS VEIN;   Surgeon: Maeola Harman, MD;  Location: [facility] OR;  Service: Vascular;  Laterality: Right;  . LOWER EXTREMITY ANGIOGRAPHY Right 08/17/2017   Procedure: Lower Extremity Angiography;  Surgeon: Maeola Harman, MD;  Location: [facility] INVASIVE CV LAB;  Service: Cardiovascular;  Laterality: Right;  . PERIPHERAL VASCULAR BALLOON ANGIOPLASTY Right 08/17/2017   Procedure: PERIPHERAL VASCULAR BALLOON ANGIOPLASTY;  Surgeon: Maeola Harman, MD;  Location: [facility] INVASIVE CV LAB;  Service: Cardiovascular;  Laterality: Right;  SFA UNABLE TO CROSS  . VEIN HARVEST Right 08/18/2017   Procedure: VEIN HARVEST RIGHT GREAT SAPHENOUS;  Surgeon: Maeola Harman, MD;  Location: [facility] OR;  Service: Vascular;  Laterality: Right;  . WOUND DEBRIDEMENT Right 08/18/2017   Procedure: DEBRIDEMENT WOUND RIGHT FOOT;  Surgeon: Maeola Harman, MD;  Location: [facility] OR;  Service: Vascular;  Laterality: Right;    Allergies  Allergen Reactions  . Penicillins Hives and Rash    Childhood reaction Has patient had a PCN reaction causing immediate rash, facial/tongue/throat swelling, SOB or lightheadedness with hypotension: No PT DEVELOPED ## SEVERE ## RASH INVOLVING MUCUS MEMBRANES or SKIN NECROSIS: #  #  YES  #  # Has patient had a PCN reaction that required hospitalization: Unknown Has patient had a PCN reaction occurring within the last 10 years: No  . Clindamycin Rash    Diffuse rash across body    Current Outpatient Medications  Medication Sig Dispense Refill  . Albuterol Sulfate 108 (90 Base) MCG/ACT AEPB Inhale 90 mcg into the lungs.    Marland Kitchen aspirin 325 MG tablet Take 325 mg by mouth daily. As a buffered tablet antiplatelet    . budesonide-formoterol (SYMBICORT) 160-4.5 MCG/ACT inhaler Inhale 2 puffs into the lungs 2 (two) times daily.    . enalapril (VASOTEC) 10 MG tablet Take 10 mg by mouth daily.    . hydrochlorothiazide (MICROZIDE) 12.5 MG capsule Take 12.5 mg by mouth daily.    . Multiple  Vitamins-Minerals (CENTRUM SILVER 50+MEN PO) Take by mouth.    . rosuvastatin (CRESTOR) 10 MG tablet Take 1 tablet (10 mg total) by mouth daily.    . temazepam (RESTORIL) 30 MG capsule Take 30 mg by mouth at bedtime as needed for sleep.     No current facility-administered medications for this visit.     ROS: See HPI for pertinent positives and negatives.   Physical Examination  Vitals:   08/10/18 1237  BP: 134/68  Pulse: 67  Resp: 18  Temp: (!) 97 F (36.1 C)  TempSrc: Oral  SpO2: 97%  Weight: 250 lb (113.4 kg)  Height: 6' (1.829 m)   Body mass index is 33.91 kg/m.  General: A&O x 3, WDWN, obese male. Gait: slight limp HENT: No gross abnormalities.  Eyes: PERRLA. Pulmonary: Respirations are non labored, CTAB, good air movement in all fields Cardiac: regular rhythm, no detected murmur.         Carotid Bruits Right Left   Negative Negative  Radial pulses are 2+ palpable bilaterally   Adominal aortic pulse is not palpable                         VASCULAR EXAM: Extremities without ischemic changes, without Gangrene; with open shallow wounds at the plantar aspect of right first metatarsal head.  S/p right TMA.  See photo below.   Right foot                                                                                                              LE Pulses Right Left       FEMORAL  2+ palpable  not palpable        POPLITEAL  not palpable   not palpable       POSTERIOR TIBIAL  not palpable   not palpable        DORSALIS PEDIS      ANTERIOR TIBIAL not palpable  not palpable    Abdomen: soft, NT, no palpable masses. Skin: no rashes, no cellulitis, see Extremities. Musculoskeletal: no muscle wasting or atrophy. See Extremities. Neurologic: A&O X 3; appropriate affect, Sensation is normal; MOTOR FUNCTION:  moving all extremities equally, motor strength 5/5 throughout. Speech is fluent/normal. CN 2-12 intact. Psychiatric: Thought content is normal, mood  appropriate for clinical situation.     ASSESSMENT: Jorge Clements is a 73 y.o. male who has a history of bilateral lower extremity stenting in May of 2019.  He then underwent right femoral to above-knee popliteal bypass graft by Dr. Randie Heinz and right TMA by Dr. Lajoyce Corners on 08-18-17.  In follow-up he had significant swelling of his right lower extremity DVT study was negative.    The wound at the head of his right first metatarsal is healing slowly, attending wound care 3x/week. Due to the wound being at the plantar surface, pt states he was advised by wound care personnel to walk as little as possible, keeping pressure off the wound. He therefor has not been walking to improve the arterial perfusion in his lower extremities, and his ABI's have decline bilaterally, all monophasic waveforms.   He has no other wounds on his feet, no gangrene, no signs of ischemia. Left toe pressure is low at 0.19.   See Plan.    DATA  ABI (Date: 08/10/2018): ABI Findings: +---------+------------------+-----+----------+--------+ Right    Rt Pressure (mmHg)IndexWaveform  Comment  +---------+------------------+-----+----------+--------+ Brachial 151                                       +---------+------------------+-----+----------+--------+ PTA      113               0.73 monophasic         +---------+------------------+-----+----------+--------+ DP       110               0.71 monophasic         +---------+------------------+-----+----------+--------+  Great Toe                                 TMA      +---------+------------------+-----+----------+--------+  +---------+------------------+-----+----------+-------+ Left     Lt Pressure (mmHg)IndexWaveform  Comment +---------+------------------+-----+----------+-------+ Brachial 154                                      +---------+------------------+-----+----------+-------+ PTA      78                0.51 monophasic         +---------+------------------+-----+----------+-------+ DP       79                0.51 monophasic        +---------+------------------+-----+----------+-------+ Great Toe29                0.19 Abnormal          +---------+------------------+-----+----------+-------+  +-------+-----------+-----------+------------+------------+ ABI/TBIToday's ABIToday's TBIPrevious ABIPrevious TBI +-------+-----------+-----------+------------+------------+ Right  0.73       TMA        0.87        TMA          +-------+-----------+-----------+------------+------------+ Left   0.51       0.19       0.68        0.43         +-------+-----------+-----------+------------+------------+  Bilateral ABIs and TBIs appear decreased compared to prior study on 12/30/2017.   Summary: Right: Resting right ankle-brachial index indicates moderate right lower extremity arterial disease.  Left: Resting left ankle-brachial index indicates moderate left lower extremity arterial disease. The left toe-brachial index is abnormal. LT Great toe pressure = 29 mmHg. PPG tracings appear dampened.    PLAN:  Based on the patient's vascular studies and examination, pt will return to clinic in 3 months with ABI's and bilateral LE arterial duplex, see Dr. Randie Heinz, or me only on a day that Dr. Randie Heinz is in the office. I advised pt to notify us if the wound in his right foot fails to heal or worsens, or if he develops other wounds in his lower extremities. I also advised him to notify us if pain in his lower extremities  wakes him at night.   Continue with wound care to right foot healing wound on plantar surface.   Daily seated leg exercises discussed and demonstrated.   I discussed in depth with the patient the nature of atherosclerosis, and emphasized the importance of maximal medical management including strict control of blood pressure, blood glucose, and lipid levels, obtaining regular exercise,  and continued cessation of smoking.  The patient is aware that without maximal medical management the underlying atherosclerotic disease process will progress, limiting the benefit of any interventions.  The patient was given information about PAD including signs, symptoms, treatment, what symptoms should prompt the patient to seek immediate medical care, and risk reduction measures to take.  Charisse March, RN, MSN, FNP-C [facility] [place] Phone: [phone]  Clinic MD: Darrick Penna  08/10/18 12:59 PM

## 2018-08-11 ENCOUNTER — Encounter (HOSPITAL_COMMUNITY): Payer: Medicare HMO

## 2018-08-11 ENCOUNTER — Encounter (HOSPITAL_COMMUNITY): Payer: Self-pay

## 2018-08-11 ENCOUNTER — Ambulatory Visit: Payer: Self-pay | Admitting: Family

## 2018-08-11 ENCOUNTER — Ambulatory Visit (HOSPITAL_COMMUNITY): Payer: Medicare HMO

## 2018-08-11 DIAGNOSIS — S91001A Unspecified open wound, right ankle, initial encounter: Secondary | ICD-10-CM | POA: Diagnosis not present

## 2018-08-11 DIAGNOSIS — S91301A Unspecified open wound, right foot, initial encounter: Secondary | ICD-10-CM | POA: Diagnosis not present

## 2018-08-11 NOTE — Therapy (Signed)
[place], [place], [address] Phone: [phone]   Fax:  [phone]  Wound Care Therapy  Patient Details  Name: Jorge Clements MRN: [id number] Date of Birth: [birth year] Referring Provider (PT): Celene Squibb, MD   Encounter Date: 08/11/2018  PT End of Session - 08/11/18 1132    Visit Number  27    Number of Visits  38    Date for PT Re-Evaluation  07/19/18   Minireassess 06/21/18   Authorization Type  [employer] medicare PPO ( no auth required, no visit limit)    Authorization Time Period  05/24/2018 - 07/21/2018; 07/17/18-08/07/18; 08/07/18-09/08/18    Authorization - Visit Number  2    Authorization - Number of Visits  10    PT Start Time  1032    PT Stop Time  1114    PT Time Calculation (min)  42 min    Activity Tolerance  Patient tolerated treatment well    Behavior During Therapy  [facility] for tasks assessed/performed       Past Medical History:  Diagnosis Date  . COPD (chronic obstructive pulmonary disease) ([place])     Past Surgical History:  Procedure Laterality Date  . ABDOMINAL AORTOGRAM N/A 08/17/2017   Procedure: ABDOMINAL AORTOGRAM;  Surgeon: Waynetta Sandy, MD;  Location: [place] CV LAB;  Service: Cardiovascular;  Laterality: N/A;  . AMPUTATION Right 08/21/2017   Procedure: TRANSMETATARSAL AMPUTATION RIGHT FOOT;  Surgeon: Newt Minion, MD;  Location: [place];  Service: Orthopedics;  Laterality: Right;  . AMPUTATION TOE Right 08/18/2017   Procedure: AMPUTATION RIGHT SECOND AND THIRD TOES;  Surgeon: Waynetta Sandy, MD;  Location: [place];  Service: Vascular;  Laterality: Right;  . ENDARTERECTOMY FEMORAL Right 08/18/2017   Procedure: ENDARTERECTOMY RIGHT SUPERFICIAL Jerene Canny;  Surgeon: Waynetta Sandy, MD;  Location: [place];  Service: Vascular;  Laterality: Right;  . FEMORAL-POPLITEAL BYPASS GRAFT Right 08/18/2017   Procedure: BYPASS GRAFT RIGHT COMMON FEMORAL TO ABOVE KNEE POPLITEAL  ARTERY USING RIGHT REVERSED GREAT SAPHENOUS VEIN;  Surgeon: Waynetta Sandy, MD;  Location: [place];  Service: Vascular;  Laterality: Right;  . LOWER EXTREMITY ANGIOGRAPHY Right 08/17/2017   Procedure: Lower Extremity Angiography;  Surgeon: Waynetta Sandy, MD;  Location: [place] CV LAB;  Service: Cardiovascular;  Laterality: Right;  . PERIPHERAL VASCULAR BALLOON ANGIOPLASTY Right 08/17/2017   Procedure: PERIPHERAL VASCULAR BALLOON ANGIOPLASTY;  Surgeon: Waynetta Sandy, MD;  Location: [place] CV LAB;  Service: Cardiovascular;  Laterality: Right;  SFA UNABLE TO CROSS  . VEIN HARVEST Right 08/18/2017   Procedure: VEIN HARVEST RIGHT GREAT SAPHENOUS;  Surgeon: Waynetta Sandy, MD;  Location: [place];  Service: Vascular;  Laterality: Right;  . WOUND DEBRIDEMENT Right 08/18/2017   Procedure: DEBRIDEMENT WOUND RIGHT FOOT;  Surgeon: Waynetta Sandy, MD;  Location: [place];  Service: Vascular;  Laterality: Right;    There were no vitals filed for this visit.   Wound Therapy - 08/11/18 1121    Subjective  Patient arrives with dressing from MD office in place. Some old, dry drainage is visible on exterior of gauze wrap. Patient reports that having his leg unwrapped allows him to "feel more" he describes it as feeling his movement more.     Patient and Family Stated Goals  wounds to heal    Date of Onset  --   >1 year ago   Prior Treatments  no self care, prior amputations  Pain Scale  0-10    Pain Score  0-No pain    Evaluation and Treatment Procedures Explained to Patient/Family  Yes    Evaluation and Treatment Procedures  agreed to    Wound Properties Date First Assessed: 05/26/18 Time First Assessed: 1600 Wound Type: Other (Comment) , opening plantar surface  Location: Foot Location Orientation: Right Present on Admission: Yes   Dressing Type  Hydrocolloid;Compression wrap;Gauze (Comment)   duoderm, 2x2, profore lite   Dressing Changed  Changed     Dressing Status  Old drainage    Dressing Change Frequency  PRN    Site / Wound Assessment  Clean    % Wound base Red or Granulating  100%   red   % Wound base Yellow/Fibrinous Exudate  0%    % Wound base Other/Granulation Tissue (Comment)  0%    Peri-wound Assessment  Maceration    Drainage Amount  Minimal    Drainage Description  Serosanguineous    Treatment  Cleansed;Debridement (Selective)    Selective Debridement - Location  plantar foot    Selective Debridement - Tools Used  Forceps;Scissors    Selective Debridement - Tissue Removed  Slough, dried exudate, devitalized tissue, margins, callous.    Wound Therapy - Clinical Statement  Patient's wound is fully filled in with no depth present in wound bed. Wound bed tissue remains elevated ~1 inch above skin surface. Margins continue to approximate superiorly and laterally and calloused dry edges were removed with selective debridement. Continued with duoderm extra thin followed by alginate, ABD, and profore wrap. He will continue to benefit from skilled wound care to promote wound healing. Once wound is healed he will need to be measured for light compression garment per vascular MD recommendation for Rt LE only.     Wound Therapy - Functional Problem List  Impared walking/mobility, impaired self care/ADL performance    Factors Delaying/Impairing Wound Healing  Altered sensation;Tobacco use;Vascular compromise    Hydrotherapy Plan  Debridement;Dressing change;Patient/family education;Pulsatile lavage with suction    Wound Therapy - Frequency  2X / week    Wound Therapy - Current Recommendations  PT    Wound Plan  Measure Mondays. Continue with profore lite for edema management. Continue with topical agents and debridement as appropriate for wound healing stage.    Dressing  Plantar foot wound: duoderm extra thin, alginate, ABD, gauze, profore lite        PT Short Term Goals - 06/21/18 1441      PT SHORT TERM GOAL #1   Title  Patient  will have no callous present at base of Rt foot to indicate improve WB tolerance and self care.    Baseline  no callous but wound present    Time  4    Period  Weeks    Status  Partially Met    Target Date  06/21/18      PT SHORT TERM GOAL #2   Title  Wound will reduce by 50% surface area to indicate wound healing and progress towards proliferative stage of healing away from inflammatory stage.    Baseline  achieved for ant foot and medial ankle wounds    Time  4    Period  Weeks    Status  Partially Met        PT Long Term Goals - 07/17/18 1153      PT LONG TERM GOAL #1   Title  Rt medial ankle wound will be healing with 90% reducation in  surface area indicating goof approximation of wound with no evidence of imparied periwound integrity.    Time  8    Period  Weeks    Status  Achieved      PT LONG TERM GOAL #2   Title  Rt foot wound will be healing with 90% reducation in surface area indicating goof approximation of wound with no evidence of imparied periwound integrity.    Time  8    Period  Weeks    Status  On-going      PT LONG TERM GOAL #3   Title  Patient will have 75% reduced edema and if appropriate per vascular MD (Possible Dr. Donzetta Matters) patient will be independent in donnig/doffing and carign for compresison garment to manage edema of Rt LE.    Time  8    Period  Weeks    Status  On-going        Plan - 08/11/18 1133    Clinical Impression Statement  see above    Rehab Potential  Fair    Clinical Impairments Affecting Rehab Potential  (-) chronic woudn history, (-) decreaed self care motivation    PT Frequency  2x / week    PT Duration  3 weeks    PT Treatment/Interventions  ADLs/Self Care Home Management;Compression bandaging;Manual techniques;Patient/family education   skilled wound care, selective debridment, compression wrapping   PT Next Visit Plan  see above    Consulted and Agree with Plan of Care  Patient       Patient will benefit from skilled  therapeutic intervention in order to improve the following deficits and impairments:  Abnormal gait, Decreased balance, Decreased skin integrity, Increased edema, Impaired sensation, Decreased mobility, Difficulty walking  Visit Diagnosis: Open wound of right ankle, initial encounter  Open wound of right foot, initial encounter     Problem List Patient Active Problem List   Diagnosis Date Noted  . Status post transmetatarsal amputation of right foot ([place])   . Subacute osteomyelitis, right ankle and foot ([place])   . Infected blister of foot 08/13/2017  . Pressure injury of skin 08/12/2017  . Weakness 08/11/2017  . COPD (chronic obstructive pulmonary disease) ([place]) 08/11/2017  . Cellulitis of right lower extremity 08/11/2017  . Fall at home, initial encounter 08/11/2017  . Thrombocytopenia ([place]) 08/11/2017  . Weakness generalized 08/11/2017  . Hypokalemia 08/11/2017  . Essential hypertension 08/11/2017    Kipp Brood, PT, DPT, [facility] Physical Therapist with [facility]  08/11/2018 11:33 AM    [place] [address] [place], [place], [address] Phone: [phone]   Fax:  [phone]  Name: Orbin Mayeux MRN: [id number] Date of Birth: [birth year]

## 2018-08-14 ENCOUNTER — Other Ambulatory Visit: Payer: Self-pay

## 2018-08-14 ENCOUNTER — Ambulatory Visit (HOSPITAL_COMMUNITY): Payer: Medicare HMO

## 2018-08-14 ENCOUNTER — Encounter (HOSPITAL_COMMUNITY): Payer: Self-pay

## 2018-08-14 DIAGNOSIS — S91301A Unspecified open wound, right foot, initial encounter: Secondary | ICD-10-CM | POA: Diagnosis not present

## 2018-08-14 DIAGNOSIS — S91001A Unspecified open wound, right ankle, initial encounter: Secondary | ICD-10-CM

## 2018-08-14 NOTE — Therapy (Signed)
[place], [place], [address] Phone: [phone]   Fax:  [phone]  Wound Care Therapy  Patient Details  Name: Jorge Clements MRN: [id number] Date of Birth: Jan 10, 1946 Referring Provider (PT): Celene Squibb, MD   Encounter Date: 08/14/2018  PT End of Session - 08/14/18 1248    Visit Number  28    Number of Visits  36    Date for PT Re-Evaluation  07/19/18   Minireassess 06/21/18   Authorization Type  [employer] medicare PPO ( no auth required, no visit limit)    Authorization Time Period  05/24/2018 - 07/21/2018; 07/17/18-08/07/18; 08/07/18-09/08/18    Authorization - Visit Number  3    Authorization - Number of Visits  10    PT Start Time  1034    PT Stop Time  1116    PT Time Calculation (min)  42 min    Activity Tolerance  Patient tolerated treatment well    Behavior During Therapy  [facility] for tasks assessed/performed       Past Medical History:  Diagnosis Date  . COPD (chronic obstructive pulmonary disease) ([place])     Past Surgical History:  Procedure Laterality Date  . ABDOMINAL AORTOGRAM N/A 08/17/2017   Procedure: ABDOMINAL AORTOGRAM;  Surgeon: Waynetta Sandy, MD;  Location: [place] [facility];  Service: Cardiovascular;  Laterality: N/A;  . AMPUTATION Right 08/21/2017   Procedure: TRANSMETATARSAL AMPUTATION RIGHT FOOT;  Surgeon: Newt Minion, MD;  Location: [place];  Service: Orthopedics;  Laterality: Right;  . AMPUTATION TOE Right 08/18/2017   Procedure: AMPUTATION RIGHT SECOND AND THIRD TOES;  Surgeon: Waynetta Sandy, MD;  Location: [place];  Service: Vascular;  Laterality: Right;  . ENDARTERECTOMY FEMORAL Right 08/18/2017   Procedure: ENDARTERECTOMY RIGHT SUPERFICIAL Jerene Canny;  Surgeon: Waynetta Sandy, MD;  Location: [place];  Service: Vascular;  Laterality: Right;  . FEMORAL-POPLITEAL BYPASS GRAFT Right 08/18/2017   Procedure: BYPASS GRAFT RIGHT COMMON FEMORAL TO ABOVE KNEE POPLITEAL  ARTERY USING RIGHT REVERSED GREAT SAPHENOUS VEIN;  Surgeon: Waynetta Sandy, MD;  Location: [place];  Service: Vascular;  Laterality: Right;  . LOWER EXTREMITY ANGIOGRAPHY Right 08/17/2017   Procedure: Lower Extremity Angiography;  Surgeon: Waynetta Sandy, MD;  Location: [place] [facility];  Service: Cardiovascular;  Laterality: Right;  . PERIPHERAL VASCULAR BALLOON ANGIOPLASTY Right 08/17/2017   Procedure: PERIPHERAL VASCULAR BALLOON ANGIOPLASTY;  Surgeon: Waynetta Sandy, MD;  Location: [place] [facility];  Service: Cardiovascular;  Laterality: Right;  SFA UNABLE TO CROSS  . VEIN HARVEST Right 08/18/2017   Procedure: VEIN HARVEST RIGHT GREAT SAPHENOUS;  Surgeon: Waynetta Sandy, MD;  Location: [place];  Service: Vascular;  Laterality: Right;  . WOUND DEBRIDEMENT Right 08/18/2017   Procedure: DEBRIDEMENT WOUND RIGHT FOOT;  Surgeon: Waynetta Sandy, MD;  Location: [place];  Service: Vascular;  Laterality: Right;    There were no vitals filed for this visit.     Wound Therapy - 08/14/18 1241    Subjective  Patient's dressing is intact and he denies pain. He reports he has a new dog that he is taking care of for his ex-wife.    Patient and Family Stated Goals  wounds to heal    Date of Onset  --   >1 year ago   Prior Treatments  no self care, prior amputations    Pain Scale  0-10    Pain Score  0-No pain  Evaluation and Treatment Procedures Explained to Patient/Family  Yes    Evaluation and Treatment Procedures  agreed to    Wound Properties Date First Assessed: 05/26/18 Time First Assessed: 1600 Wound Type: Other (Comment) , opening plantar surface  Location: Foot Location Orientation: Right Present on Admission: Yes   Dressing Type  Hydrocolloid;Compression wrap;Gauze (Comment)   duoderm, 2x2, profore lite   Dressing Changed  Changed    Dressing Status  Old drainage    Dressing Change Frequency  PRN    Site / Wound Assessment  Clean    % Wound  base Red or Granulating  100%   red   % Wound base Yellow/Fibrinous Exudate  0%    % Wound base Other/Granulation Tissue (Comment)  0%    Peri-wound Assessment  Maceration    Wound Length (cm)  1.6 cm   1.8   Wound Width (cm)  1.3 cm   1.4   Wound Depth (cm)  -0.1 cm   -0.2   Wound Volume (cm^3)  -0.21 cm^3    Wound Surface Area (cm^2)  2.08 cm^2    Drainage Amount  Minimal    Drainage Description  Serosanguineous    Treatment  Cleansed;Debridement (Selective)    Selective Debridement - Location  plantar foot    Selective Debridement - Tools Used  Forceps;Scissors    Selective Debridement - Tissue Removed  Slough, dried exudate, devitalized tissue, margins, callous.    Wound Therapy - Clinical Statement  Patient's wound continues to reduce in size and margins are beginning to approximate with wound bed. The medial boarder remains unattached but anticipate it will join over the next week. Wound bed remains slightly elevated compared to skin but is decreasing and drainage is reduced as well. Continued with selective debridement to margins as they continue to callous between treatments.  Applied duoderm extra thin followed by alginate, ABD, and profore lite wrap. He will continue to benefit from skilled wound care to promote wound healing.     Wound Therapy - Functional Problem List  Impared walking/mobility, impaired self care/ADL performance    Factors Delaying/Impairing Wound Healing  Altered sensation;Tobacco use;Vascular compromise    Hydrotherapy Plan  Debridement;Dressing change;Patient/family education;Pulsatile lavage with suction    Wound Therapy - Frequency  2X / week    Wound Therapy - Current Recommendations  PT    Wound Plan  Measure Mondays. Continue with profore lite for edema management. Continue with topical agents and debridement as appropriate for wound healing stage.    Dressing  Plantar foot wound: duoderm extra thin, alginate, ABD, gauze, profore lite        PT  Short Term Goals - 06/21/18 1441      PT SHORT TERM GOAL #1   Title  Patient will have no callous present at base of Rt foot to indicate improve WB tolerance and self care.    Baseline  no callous but wound present    Time  4    Period  Weeks    Status  Partially Met    Target Date  06/21/18      PT SHORT TERM GOAL #2   Title  Wound will reduce by 50% surface area to indicate wound healing and progress towards proliferative stage of healing away from inflammatory stage.    Baseline  achieved for ant foot and medial ankle wounds    Time  4    Period  Weeks    Status  Partially Met  PT Long Term Goals - 07/17/18 1153      PT LONG TERM GOAL #1   Title  Rt medial ankle wound will be healing with 90% reducation in surface area indicating goof approximation of wound with no evidence of imparied periwound integrity.    Time  8    Period  Weeks    Status  Achieved      PT LONG TERM GOAL #2   Title  Rt foot wound will be healing with 90% reducation in surface area indicating goof approximation of wound with no evidence of imparied periwound integrity.    Time  8    Period  Weeks    Status  On-going      PT LONG TERM GOAL #3   Title  Patient will have 75% reduced edema and if appropriate per vascular MD (Possible Dr. Donzetta Matters) patient will be independent in donnig/doffing and carign for compresison garment to manage edema of Rt LE.    Time  8    Period  Weeks    Status  On-going         Plan - 08/14/18 1248    Clinical Impression Statement  see above    Rehab Potential  Fair    Clinical Impairments Affecting Rehab Potential  (-) chronic woudn history, (-) decreaed self care motivation    PT Frequency  2x / week    PT Duration  3 weeks    PT Treatment/Interventions  ADLs/Self Care Home Management;Compression bandaging;Manual techniques;Patient/family education   skilled wound care, selective debridment, compression wrapping   PT Next Visit Plan  see above    Consulted and  Agree with Plan of Care  Patient       Patient will benefit from skilled therapeutic intervention in order to improve the following deficits and impairments:  Abnormal gait, Decreased balance, Decreased skin integrity, Increased edema, Impaired sensation, Decreased mobility, Difficulty walking  Visit Diagnosis: Open wound of right ankle, initial encounter  Open wound of right foot, initial encounter     Problem List Patient Active Problem List   Diagnosis Date Noted  . Status post transmetatarsal amputation of right foot ([place])   . Subacute osteomyelitis, right ankle and foot ([place])   . Infected blister of foot 08/13/2017  . Pressure injury of skin 08/12/2017  . Weakness 08/11/2017  . COPD (chronic obstructive pulmonary disease) ([place]) 08/11/2017  . Cellulitis of right lower extremity 08/11/2017  . Fall at home, initial encounter 08/11/2017  . Thrombocytopenia ([place]) 08/11/2017  . Weakness generalized 08/11/2017  . Hypokalemia 08/11/2017  . Essential hypertension 08/11/2017    Kipp Brood, PT, DPT, [facility] Physical Therapist with [facility]  08/14/2018 12:49 PM    [place], [place], [address] Phone: [phone]   Fax:  [phone]  Name: Jhonny Calixto MRN: [id number] Date of Birth: 1946/03/05

## 2018-08-18 ENCOUNTER — Ambulatory Visit (HOSPITAL_COMMUNITY): Payer: Medicare HMO | Attending: Internal Medicine | Admitting: Physical Therapy

## 2018-08-18 ENCOUNTER — Other Ambulatory Visit: Payer: Self-pay

## 2018-08-18 DIAGNOSIS — S91301A Unspecified open wound, right foot, initial encounter: Secondary | ICD-10-CM | POA: Diagnosis present

## 2018-08-18 DIAGNOSIS — S91001A Unspecified open wound, right ankle, initial encounter: Secondary | ICD-10-CM | POA: Diagnosis present

## 2018-08-18 NOTE — Therapy (Signed)
[place], [place], [address] Phone: [phone]   Fax:  [phone]  Wound Care Therapy  Patient Details  Name: Jorge Clements MRN: [id number] Date of Birth: 10/07/1945 Referring Provider (PT): Celene Squibb, MD   Encounter Date: 08/18/2018  PT End of Session - 08/18/18 1301    Visit Number  29   PN completed visit #8, #16, #26   Number of Visits  36    Date for PT Re-Evaluation  07/19/18   Minireassess 06/21/18   Authorization Type  [employer] medicare PPO ( no auth required, no visit limit)    Authorization Time Period  05/24/2018 - 07/21/2018; 07/17/18-08/07/18; 08/07/18-09/08/18    Authorization - Visit Number  3    Authorization - Number of Visits  10    PT Start Time  0900    PT Stop Time  0940    PT Time Calculation (min)  40 min    Activity Tolerance  Patient tolerated treatment well    Behavior During Therapy  [facility] for tasks assessed/performed       Past Medical History:  Diagnosis Date  . COPD (chronic obstructive pulmonary disease) ([place])     Past Surgical History:  Procedure Laterality Date  . ABDOMINAL AORTOGRAM N/A 08/17/2017   Procedure: ABDOMINAL AORTOGRAM;  Surgeon: Waynetta Sandy, MD;  Location: [place] [facility];  Service: Cardiovascular;  Laterality: N/A;  . AMPUTATION Right 08/21/2017   Procedure: TRANSMETATARSAL AMPUTATION RIGHT FOOT;  Surgeon: Newt Minion, MD;  Location: [place];  Service: Orthopedics;  Laterality: Right;  . AMPUTATION TOE Right 08/18/2017   Procedure: AMPUTATION RIGHT SECOND AND THIRD TOES;  Surgeon: Waynetta Sandy, MD;  Location: [place];  Service: Vascular;  Laterality: Right;  . ENDARTERECTOMY FEMORAL Right 08/18/2017   Procedure: ENDARTERECTOMY RIGHT SUPERFICIAL Jerene Canny;  Surgeon: Waynetta Sandy, MD;  Location: [place];  Service: Vascular;  Laterality: Right;  . FEMORAL-POPLITEAL BYPASS GRAFT Right 08/18/2017   Procedure: BYPASS GRAFT RIGHT COMMON  FEMORAL TO ABOVE KNEE POPLITEAL ARTERY USING RIGHT REVERSED GREAT SAPHENOUS VEIN;  Surgeon: Waynetta Sandy, MD;  Location: [place];  Service: Vascular;  Laterality: Right;  . LOWER EXTREMITY ANGIOGRAPHY Right 08/17/2017   Procedure: Lower Extremity Angiography;  Surgeon: Waynetta Sandy, MD;  Location: [place] [facility];  Service: Cardiovascular;  Laterality: Right;  . PERIPHERAL VASCULAR BALLOON ANGIOPLASTY Right 08/17/2017   Procedure: PERIPHERAL VASCULAR BALLOON ANGIOPLASTY;  Surgeon: Waynetta Sandy, MD;  Location: [place] [facility];  Service: Cardiovascular;  Laterality: Right;  SFA UNABLE TO CROSS  . VEIN HARVEST Right 08/18/2017   Procedure: VEIN HARVEST RIGHT GREAT SAPHENOUS;  Surgeon: Waynetta Sandy, MD;  Location: [place];  Service: Vascular;  Laterality: Right;  . WOUND DEBRIDEMENT Right 08/18/2017   Procedure: DEBRIDEMENT WOUND RIGHT FOOT;  Surgeon: Waynetta Sandy, MD;  Location: [place];  Service: Vascular;  Laterality: Right;    There were no vitals filed for this visit.              Wound Therapy - 08/18/18 1255    Subjective  dressing intact, no issues    Patient and Family Stated Goals  wounds to heal    Date of Onset  --   >1 year ago   Prior Treatments  no self care, prior amputations    Pain Scale  0-10    Pain Score  0-No pain    Evaluation and Treatment Procedures  Explained to Patient/Family  Yes    Evaluation and Treatment Procedures  agreed to    Wound Properties Date First Assessed: 05/26/18 Time First Assessed: 1600 Wound Type: Other (Comment) , opening plantar surface  Location: Foot Location Orientation: Right Present on Admission: Yes   Dressing Type  Hydrocolloid;Compression wrap;Gauze (Comment)   duoderm, 2x2, profore lite   Dressing Changed  Changed    Dressing Status  Old drainage    Dressing Change Frequency  PRN    Site / Wound Assessment  Clean    % Wound base Red or Granulating  100%   red   %  Wound base Yellow/Fibrinous Exudate  0%    % Wound base Other/Granulation Tissue (Comment)  0%    Peri-wound Assessment  Maceration    Wound Length (cm)  0.8 cm    Wound Width (cm)  0.5 cm    Wound Depth (cm)  0.2 cm    Wound Volume (cm^3)  0.08 cm^3    Wound Surface Area (cm^2)  0.4 cm^2    Drainage Amount  Minimal    Drainage Description  Serosanguineous    Treatment  Cleansed;Debridement (Selective)    Selective Debridement - Location  plantar foot    Selective Debridement - Tools Used  Forceps;Scissors    Selective Debridement - Tissue Removed  Slough, dried exudate, devitalized tissue, margins, callous.    Wound Therapy - Clinical Statement  wound reduced half of it's size compared to Monday's measurements.  Debrided edges and remaining dry skin from perimeter.  Moisturized periemet prior to redressing with duoderm.  Applied medipore tape at a stretch to help with compression.  Cleansed LE and moisturized well prior to redressing with profore lite. Measured LE for compresssion garment and given information to order from [employer].  Also shown sock butler that he could also order to assist with donning.  PT to bring in his current stockings to work with them until receives his new ones.      Wound Therapy - Functional Problem List  Impared walking/mobility, impaired self care/ADL performance    Factors Delaying/Impairing Wound Healing  Altered sensation;Tobacco use;Vascular compromise    Hydrotherapy Plan  Debridement;Dressing change;Patient/family education;Pulsatile lavage with suction    Wound Therapy - Frequency  2X / week    Wound Therapy - Current Recommendations  PT    Wound Plan  Measure Mondays. Continue with profore lite for edema management. Continue with topical agents and debridement as appropriate for wound healing stage.              PT Education - 08/18/18 1310    Education Details  reviewed how to use sock butler, care and importance of compression  garemtnts.  Given information sheet regarding [employer] therapy and instructions.    Person(s) Educated  Patient    Methods  Explanation    Comprehension  Verbalized understanding       PT Short Term Goals - 06/21/18 1441      PT SHORT TERM GOAL #1   Title  Patient will have no callous present at base of Rt foot to indicate improve WB tolerance and self care.    Baseline  no callous but wound present    Time  4    Period  Weeks    Status  Partially Met    Target Date  06/21/18      PT SHORT TERM GOAL #2   Title  Wound will reduce by 50% surface area to indicate  wound healing and progress towards proliferative stage of healing away from inflammatory stage.    Baseline  achieved for ant foot and medial ankle wounds    Time  4    Period  Weeks    Status  Partially Met        PT Long Term Goals - 07/17/18 1153      PT LONG TERM GOAL #1   Title  Rt medial ankle wound will be healing with 90% reducation in surface area indicating goof approximation of wound with no evidence of imparied periwound integrity.    Time  8    Period  Weeks    Status  Achieved      PT LONG TERM GOAL #2   Title  Rt foot wound will be healing with 90% reducation in surface area indicating goof approximation of wound with no evidence of imparied periwound integrity.    Time  8    Period  Weeks    Status  On-going      PT LONG TERM GOAL #3   Title  Patient will have 75% reduced edema and if appropriate per vascular MD (Possible Dr. Donzetta Matters) patient will be independent in donnig/doffing and carign for compresison garment to manage edema of Rt LE.    Time  8    Period  Weeks    Status  On-going              Patient will benefit from skilled therapeutic intervention in order to improve the following deficits and impairments:     Visit Diagnosis: Open wound of right ankle, initial encounter  Open wound of right foot, initial encounter     Problem List Patient Active Problem List   Diagnosis  Date Noted  . Status post transmetatarsal amputation of right foot ([place])   . Subacute osteomyelitis, right ankle and foot ([place])   . Infected blister of foot 08/13/2017  . Pressure injury of skin 08/12/2017  . Weakness 08/11/2017  . COPD (chronic obstructive pulmonary disease) ([place]) 08/11/2017  . Cellulitis of right lower extremity 08/11/2017  . Fall at home, initial encounter 08/11/2017  . Thrombocytopenia ([place]) 08/11/2017  . Weakness generalized 08/11/2017  . Hypokalemia 08/11/2017  . Essential hypertension 08/11/2017   Teena Irani, PTA/CLT [phone]  Teena Irani 08/18/2018, 1:13 PM  [place] [address] [place], [place], [address] Phone: [phone]   Fax:  [phone]  Name: Jorge Clements MRN: [id number] Date of Birth: June 14, 1945

## 2018-08-21 ENCOUNTER — Other Ambulatory Visit: Payer: Self-pay

## 2018-08-21 ENCOUNTER — Ambulatory Visit (HOSPITAL_COMMUNITY): Payer: Medicare HMO

## 2018-08-21 ENCOUNTER — Encounter (HOSPITAL_COMMUNITY): Payer: Self-pay

## 2018-08-21 DIAGNOSIS — S91001A Unspecified open wound, right ankle, initial encounter: Secondary | ICD-10-CM

## 2018-08-21 DIAGNOSIS — S91301A Unspecified open wound, right foot, initial encounter: Secondary | ICD-10-CM

## 2018-08-21 NOTE — Therapy (Signed)
[place], [place], [address] Phone: [phone]   Fax:  [phone]  Wound Care Therapy  Patient Details  Name: Jorge Clements MRN: [id number] Date of Birth: Aug 01, 1945 Referring Provider (PT): Celene Squibb, MD   Encounter Date: 08/21/2018  PT End of Session - 08/21/18 1310    Visit Number  30   PN completed visit #8, #16, #26   Number of Visits  36    Date for PT Re-Evaluation  07/19/18   Minireassess 06/21/18   Authorization Type  [employer] medicare PPO ( no auth required, no visit limit)    Authorization Time Period  05/24/2018 - 07/21/2018; 07/17/18-08/07/18; 08/07/18-09/08/18    Authorization - Visit Number  4    Authorization - Number of Visits  10    PT Start Time  1033    PT Stop Time  1110    PT Time Calculation (min)  37 min    Activity Tolerance  Patient tolerated treatment well    Behavior During Therapy  [facility] for tasks assessed/performed       Past Medical History:  Diagnosis Date  . COPD (chronic obstructive pulmonary disease) ([place])     Past Surgical History:  Procedure Laterality Date  . ABDOMINAL AORTOGRAM N/A 08/17/2017   Procedure: ABDOMINAL AORTOGRAM;  Surgeon: Waynetta Sandy, MD;  Location: [facility];  Service: Cardiovascular;  Laterality: N/A;  . AMPUTATION Right 08/21/2017   Procedure: TRANSMETATARSAL AMPUTATION RIGHT FOOT;  Surgeon: Newt Minion, MD;  Location: [facility];  Service: Orthopedics;  Laterality: Right;  . AMPUTATION TOE Right 08/18/2017   Procedure: AMPUTATION RIGHT SECOND AND THIRD TOES;  Surgeon: Waynetta Sandy, MD;  Location: [facility];  Service: Vascular;  Laterality: Right;  . ENDARTERECTOMY FEMORAL Right 08/18/2017   Procedure: ENDARTERECTOMY RIGHT SUPERFICIAL Jerene Canny;  Surgeon: Waynetta Sandy, MD;  Location: [facility];  Service: Vascular;  Laterality: Right;  . FEMORAL-POPLITEAL BYPASS GRAFT Right 08/18/2017   Procedure: BYPASS GRAFT RIGHT COMMON  FEMORAL TO ABOVE KNEE POPLITEAL ARTERY USING RIGHT REVERSED GREAT SAPHENOUS VEIN;  Surgeon: Waynetta Sandy, MD;  Location: [facility];  Service: Vascular;  Laterality: Right;  . LOWER EXTREMITY ANGIOGRAPHY Right 08/17/2017   Procedure: Lower Extremity Angiography;  Surgeon: Waynetta Sandy, MD;  Location: [facility];  Service: Cardiovascular;  Laterality: Right;  . PERIPHERAL VASCULAR BALLOON ANGIOPLASTY Right 08/17/2017   Procedure: PERIPHERAL VASCULAR BALLOON ANGIOPLASTY;  Surgeon: Waynetta Sandy, MD;  Location: [facility];  Service: Cardiovascular;  Laterality: Right;  SFA UNABLE TO CROSS  . VEIN HARVEST Right 08/18/2017   Procedure: VEIN HARVEST RIGHT GREAT SAPHENOUS;  Surgeon: Waynetta Sandy, MD;  Location: [facility];  Service: Vascular;  Laterality: Right;  . WOUND DEBRIDEMENT Right 08/18/2017   Procedure: DEBRIDEMENT WOUND RIGHT FOOT;  Surgeon: Waynetta Sandy, MD;  Location: [facility];  Service: Vascular;  Laterality: Right;    There were no vitals filed for this visit.     Wound Therapy - 08/21/18 1305    Subjective  Patient arrives with dressing intact. He brought an old compression stocking that is stained around teh entire foot and heel. He reports the elastic company believes they will ship his new stocking on Wednesday.     Patient and Family Stated Goals  wounds to heal    Date of Onset  --   >1 year ago   Prior Treatments  no self care, prior amputations  Pain Scale  0-10    Pain Score  0-No pain    Evaluation and Treatment Procedures Explained to Patient/Family  Yes    Evaluation and Treatment Procedures  agreed to    Wound Properties Date First Assessed: 05/26/18 Time First Assessed: 1600 Wound Type: Other (Comment) , opening plantar surface  Location: Foot Location Orientation: Right Present on Admission: Yes   Dressing Type  Hydrocolloid;Compression wrap;Gauze (Comment)   duoderm, 2x2, profore lite   Dressing Changed   Changed    Dressing Status  Old drainage    Dressing Change Frequency  PRN    Site / Wound Assessment  Clean    % Wound base Red or Granulating  100%   red   % Wound base Yellow/Fibrinous Exudate  0%    % Wound base Other/Granulation Tissue (Comment)  0%    Peri-wound Assessment  Maceration    Drainage Amount  Minimal    Drainage Description  Serosanguineous    Treatment  Cleansed;Debridement (Selective)    Selective Debridement - Location  plantar foot    Selective Debridement - Tools Used  Forceps;Scissors    Selective Debridement - Tissue Removed  Slough, dried exudate, devitalized tissue, margins, callous.    Wound Therapy - Clinical Statement  Wound continues o present with improved attachment of wound margins to wound bed. Continued with selective debridement to promote approximation and remove calloused tissue are margins. Applied duoderm to wound bed and xeroform to small red spot on dorsum of forefoot. He will benefit from training with compression stockings for donning/doffing to prepare him for discharge when wound is healed. He will continue to benefit from skilled wound care interventions to promote healing.     Wound Therapy - Functional Problem List  Impared walking/mobility, impaired self care/ADL performance    Factors Delaying/Impairing Wound Healing  Altered sensation;Tobacco use;Vascular compromise    Hydrotherapy Plan  Debridement;Dressing change;Patient/family education;Pulsatile lavage with suction    Wound Therapy - Frequency  2X / week    Wound Therapy - Current Recommendations  PT    Wound Plan  Measure Weekly. Continue with profore lite for edema management. Continue with topical agents and debridement as appropriate for wound healing stage.         PT Short Term Goals - 06/21/18 1441      PT SHORT TERM GOAL #1   Title  Patient will have no callous present at base of Rt foot to indicate improve WB tolerance and self care.    Baseline  no callous but wound  present    Time  4    Period  Weeks    Status  Partially Met    Target Date  06/21/18      PT SHORT TERM GOAL #2   Title  Wound will reduce by 50% surface area to indicate wound healing and progress towards proliferative stage of healing away from inflammatory stage.    Baseline  achieved for ant foot and medial ankle wounds    Time  4    Period  Weeks    Status  Partially Met        PT Long Term Goals - 07/17/18 1153      PT LONG TERM GOAL #1   Title  Rt medial ankle wound will be healing with 90% reducation in surface area indicating goof approximation of wound with no evidence of imparied periwound integrity.    Time  8    Period  Weeks  Status  Achieved      PT LONG TERM GOAL #2   Title  Rt foot wound will be healing with 90% reducation in surface area indicating goof approximation of wound with no evidence of imparied periwound integrity.    Time  8    Period  Weeks    Status  On-going      PT LONG TERM GOAL #3   Title  Patient will have 75% reduced edema and if appropriate per vascular MD (Possible Dr. Donzetta Matters) patient will be independent in donnig/doffing and carign for compresison garment to manage edema of Rt LE.    Time  8    Period  Weeks    Status  On-going       Plan - 08/21/18 1311    Clinical Impression Statement  see above    Rehab Potential  Fair    Clinical Impairments Affecting Rehab Potential  (-) chronic woudn history, (-) decreaed self care motivation    PT Frequency  2x / week    PT Duration  3 weeks    PT Treatment/Interventions  ADLs/Self Care Home Management;Compression bandaging;Manual techniques;Patient/family education   skilled wound care, selective debridment, compression wrapping   PT Next Visit Plan  see above    Consulted and Agree with Plan of Care  Patient       Patient will benefit from skilled therapeutic intervention in order to improve the following deficits and impairments:  Abnormal gait, Decreased balance, Decreased skin  integrity, Increased edema, Impaired sensation, Decreased mobility, Difficulty walking  Visit Diagnosis: Open wound of right ankle, initial encounter  Open wound of right foot, initial encounter     Problem List Patient Active Problem List   Diagnosis Date Noted  . Status post transmetatarsal amputation of right foot ([place])   . Subacute osteomyelitis, right ankle and foot ([place])   . Infected blister of foot 08/13/2017  . Pressure injury of skin 08/12/2017  . Weakness 08/11/2017  . COPD (chronic obstructive pulmonary disease) ([place]) 08/11/2017  . Cellulitis of right lower extremity 08/11/2017  . Fall at home, initial encounter 08/11/2017  . Thrombocytopenia ([place]) 08/11/2017  . Weakness generalized 08/11/2017  . Hypokalemia 08/11/2017  . Essential hypertension 08/11/2017    Kipp Brood, PT, DPT, [facility] Physical Therapist with [facility]  08/21/2018 1:12 PM    [facility] [address] [place], [place], [address] Phone: [phone]   Fax:  [phone]  Name: Tahjai Schetter MRN: [id number] Date of Birth: July 01, 1945

## 2018-08-25 ENCOUNTER — Encounter (HOSPITAL_COMMUNITY): Payer: Self-pay

## 2018-08-25 ENCOUNTER — Ambulatory Visit (HOSPITAL_COMMUNITY): Payer: Medicare HMO

## 2018-08-25 ENCOUNTER — Other Ambulatory Visit: Payer: Self-pay

## 2018-08-25 DIAGNOSIS — S91301A Unspecified open wound, right foot, initial encounter: Secondary | ICD-10-CM

## 2018-08-25 DIAGNOSIS — S91001A Unspecified open wound, right ankle, initial encounter: Secondary | ICD-10-CM | POA: Diagnosis not present

## 2018-08-25 NOTE — Therapy (Signed)
[place], [place], [address] Phone: [phone]   Fax:  [phone]  Wound Care Therapy  Patient Details  Name: Jorge Clements MRN: [id number] Date of Birth: [birth year] Referring Provider (PT): Celene Squibb, MD   Encounter Date: 08/25/2018  PT End of Session - 08/25/18 1251    Visit Number  31   PN completed visit #8, #16, #26   Number of Visits  36    Date for PT Re-Evaluation  07/19/18   Minireassess 06/21/18   Authorization Type  [employer] medicare PPO ( no auth required, no visit limit)    Authorization Time Period  05/24/2018 - 07/21/2018; 07/17/18-08/07/18; 08/07/18-09/08/18    Authorization - Visit Number  4    Authorization - Number of Visits  10    PT Start Time  1037    PT Stop Time  1113    PT Time Calculation (min)  36 min    Activity Tolerance  Patient tolerated treatment well    Behavior During Therapy  [facility] for tasks assessed/performed       Past Medical History:  Diagnosis Date  . COPD (chronic obstructive pulmonary disease) ([place])     Past Surgical History:  Procedure Laterality Date  . ABDOMINAL AORTOGRAM N/A 08/17/2017   Procedure: ABDOMINAL AORTOGRAM;  Surgeon: Waynetta Sandy, MD;  Location: [facility];  Service: Cardiovascular;  Laterality: N/A;  . AMPUTATION Right 08/21/2017   Procedure: TRANSMETATARSAL AMPUTATION RIGHT FOOT;  Surgeon: Newt Minion, MD;  Location: [facility];  Service: Orthopedics;  Laterality: Right;  . AMPUTATION TOE Right 08/18/2017   Procedure: AMPUTATION RIGHT SECOND AND THIRD TOES;  Surgeon: Waynetta Sandy, MD;  Location: [facility];  Service: Vascular;  Laterality: Right;  . ENDARTERECTOMY FEMORAL Right 08/18/2017   Procedure: ENDARTERECTOMY RIGHT SUPERFICIAL Jerene Canny;  Surgeon: Waynetta Sandy, MD;  Location: [place];  Service: Vascular;  Laterality: Right;  . FEMORAL-POPLITEAL BYPASS GRAFT Right 08/18/2017   Procedure: BYPASS GRAFT RIGHT COMMON  FEMORAL TO ABOVE KNEE POPLITEAL ARTERY USING RIGHT REVERSED GREAT SAPHENOUS VEIN;  Surgeon: Waynetta Sandy, MD;  Location: [facility];  Service: Vascular;  Laterality: Right;  . LOWER EXTREMITY ANGIOGRAPHY Right 08/17/2017   Procedure: Lower Extremity Angiography;  Surgeon: Waynetta Sandy, MD;  Location: [facility];  Service: Cardiovascular;  Laterality: Right;  . PERIPHERAL VASCULAR BALLOON ANGIOPLASTY Right 08/17/2017   Procedure: PERIPHERAL VASCULAR BALLOON ANGIOPLASTY;  Surgeon: Waynetta Sandy, MD;  Location: [facility];  Service: Cardiovascular;  Laterality: Right;  SFA UNABLE TO CROSS  . VEIN HARVEST Right 08/18/2017   Procedure: VEIN HARVEST RIGHT GREAT SAPHENOUS;  Surgeon: Waynetta Sandy, MD;  Location: [facility];  Service: Vascular;  Laterality: Right;  . WOUND DEBRIDEMENT Right 08/18/2017   Procedure: DEBRIDEMENT WOUND RIGHT FOOT;  Surgeon: Waynetta Sandy, MD;  Location: [place];  Service: Vascular;  Laterality: Right;    There were no vitals filed for this visit.   Wound Therapy - 08/25/18 1243    Subjective  Patient reports he was billed for his compression stockings so he believes they have shipped. He has not gotten them yet but hopefully will by next Monday. He reports no pain or issues today.    Patient and Family Stated Goals  wounds to heal    Date of Onset  --   >1 year ago   Prior Treatments  no self care, prior amputations  Pain Scale  0-10    Pain Score  0-No pain    Evaluation and Treatment Procedures Explained to Patient/Family  Yes    Evaluation and Treatment Procedures  agreed to    Wound Properties Date First Assessed: 05/26/18 Time First Assessed: 1600 Wound Type: Other (Comment) , opening plantar surface  Location: Foot Location Orientation: Right Present on Admission: Yes   Dressing Type  Hydrocolloid;Compression wrap;Gauze (Comment)   duoderm, 2x2, profore lite   Dressing Changed  Changed    Dressing  Status  Old drainage    Dressing Change Frequency  PRN    Site / Wound Assessment  Clean    % Wound base Red or Granulating  100%   red   % Wound base Yellow/Fibrinous Exudate  0%    % Wound base Other/Granulation Tissue (Comment)  0%    Peri-wound Assessment  Maceration    Wound Length (cm)  0.6 cm    Wound Width (cm)  0.4 cm    Wound Depth (cm)  -0.1 cm   elevated above skin   Wound Volume (cm^3)  -0.02 cm^3    Wound Surface Area (cm^2)  0.24 cm^2    Drainage Amount  Minimal    Drainage Description  Serous    Treatment  Cleansed;Debridement (Selective)    Selective Debridement - Location  plantar foot    Selective Debridement - Tools Used  Forceps;Scissors    Selective Debridement - Tissue Removed  Slough, dried exudate, devitalized tissue, margins, callous.    Wound Therapy - Clinical Statement  Wound continues to reduce in size and present with reduced callous buildup. Red spots on dorsum of forefoot have healed over and no xeroform needed today. Wound on plantar aspect of 1st met head has some maceration inferior to wound bed. Granulation tissue is raised above the skin surface and this session transitioned to xeroform and alginate to manage moisture and promote further approximation of margins. He will continue to benefit from skilled wound care interventions to promote healing.    Wound Therapy - Functional Problem List  Impared walking/mobility, impaired self care/ADL performance    Factors Delaying/Impairing Wound Healing  Altered sensation;Tobacco use;Vascular compromise    Hydrotherapy Plan  Debridement;Dressing change;Patient/family education;Pulsatile lavage with suction    Wound Therapy - Frequency  2X / week    Wound Therapy - Current Recommendations  PT    Wound Plan  Measure Weekly. Continue with profore lite for edema management. Continue with topical agents and debridement as appropriate for wound healing stage.        PT Short Term Goals - 06/21/18 1441      PT  SHORT TERM GOAL #1   Title  Patient will have no callous present at base of Rt foot to indicate improve WB tolerance and self care.    Baseline  no callous but wound present    Time  4    Period  Weeks    Status  Partially Met    Target Date  06/21/18      PT SHORT TERM GOAL #2   Title  Wound will reduce by 50% surface area to indicate wound healing and progress towards proliferative stage of healing away from inflammatory stage.    Baseline  achieved for ant foot and medial ankle wounds    Time  4    Period  Weeks    Status  Partially Met        PT Long Term Goals - 07/17/18  [place] #1   Title  Rt medial ankle wound will be healing with 90% reducation in surface area indicating goof approximation of wound with no evidence of imparied periwound integrity.    Time  8    Period  Weeks    Status  Achieved      PT LONG TERM GOAL #2   Title  Rt foot wound will be healing with 90% reducation in surface area indicating goof approximation of wound with no evidence of imparied periwound integrity.    Time  8    Period  Weeks    Status  On-going      PT LONG TERM GOAL #3   Title  Patient will have 75% reduced edema and if appropriate per vascular MD (Possible Dr. Donzetta Matters) patient will be independent in donnig/doffing and carign for compresison garment to manage edema of Rt LE.    Time  8    Period  Weeks    Status  On-going        Plan - 08/25/18 1251    Clinical Impression Statement  see above    Rehab Potential  Fair    Clinical Impairments Affecting Rehab Potential  (-) chronic woudn history, (-) decreaed self care motivation    PT Frequency  2x / week    PT Duration  3 weeks    PT Treatment/Interventions  ADLs/Self Care Home Management;Compression bandaging;Manual techniques;Patient/family education   skilled wound care, selective debridment, compression wrapping   PT Next Visit Plan  see above    Consulted and Agree with Plan of Care  Patient        Patient will benefit from skilled therapeutic intervention in order to improve the following deficits and impairments:  Abnormal gait, Decreased balance, Decreased skin integrity, Increased edema, Impaired sensation, Decreased mobility, Difficulty walking  Visit Diagnosis: Open wound of right ankle, initial encounter  Open wound of right foot, initial encounter     Problem List Patient Active Problem List   Diagnosis Date Noted  . Status post transmetatarsal amputation of right foot ([place])   . Subacute osteomyelitis, right ankle and foot ([place])   . Infected blister of foot 08/13/2017  . Pressure injury of skin 08/12/2017  . Weakness 08/11/2017  . COPD (chronic obstructive pulmonary disease) ([place]) 08/11/2017  . Cellulitis of right lower extremity 08/11/2017  . Fall at home, initial encounter 08/11/2017  . Thrombocytopenia ([place]) 08/11/2017  . Weakness generalized 08/11/2017  . Hypokalemia 08/11/2017  . Essential hypertension 08/11/2017    Kipp Brood, PT, DPT, [facility] Physical Therapist with [facility]  08/25/2018 12:52 PM    [facility] [address] [place], [place], [address] Phone: [phone]   Fax:  [phone]  Name: Burdett Pinzon MRN: [id number] Date of Birth: 27-May-1945

## 2018-08-28 ENCOUNTER — Other Ambulatory Visit: Payer: Self-pay

## 2018-08-28 ENCOUNTER — Ambulatory Visit (HOSPITAL_COMMUNITY): Payer: Medicare HMO | Admitting: Physical Therapy

## 2018-08-28 DIAGNOSIS — S91301A Unspecified open wound, right foot, initial encounter: Secondary | ICD-10-CM | POA: Diagnosis not present

## 2018-08-28 DIAGNOSIS — S91001A Unspecified open wound, right ankle, initial encounter: Secondary | ICD-10-CM

## 2018-08-28 NOTE — Therapy (Signed)
[place], [place], [address] Phone: [phone]   Fax:  [phone]  Wound Care Therapy  Patient Details  Name: Jorge Clements MRN: [id number] Date of Birth: [birth year] Referring Provider (PT): Celene Squibb, MD   Encounter Date: 08/28/2018  PT End of Session - 08/28/18 1724    Visit Number  32   PN completed visit #8, #16, #26   Number of Visits  36    Date for PT Re-Evaluation  07/19/18   Minireassess 06/21/18   Authorization Type  [employer] medicare PPO ( no auth required, no visit limit)    Authorization Time Period  05/24/2018 - 07/21/2018; 07/17/18-08/07/18; 08/07/18-09/08/18    Authorization - Visit Number  5    Authorization - Number of Visits  10    PT Start Time  8115    PT Stop Time  1545    PT Time Calculation (min)  30 min    Activity Tolerance  Patient tolerated treatment well    Behavior During Therapy  [facility] for tasks assessed/performed       Past Medical History:  Diagnosis Date  . COPD (chronic obstructive pulmonary disease) ([place])     Past Surgical History:  Procedure Laterality Date  . ABDOMINAL AORTOGRAM N/A 08/17/2017   Procedure: ABDOMINAL AORTOGRAM;  Surgeon: Waynetta Sandy, MD;  Location: [place] [facility];  Service: Cardiovascular;  Laterality: N/A;  . AMPUTATION Right 08/21/2017   Procedure: TRANSMETATARSAL AMPUTATION RIGHT FOOT;  Surgeon: Newt Minion, MD;  Location: [place];  Service: Orthopedics;  Laterality: Right;  . AMPUTATION TOE Right 08/18/2017   Procedure: AMPUTATION RIGHT SECOND AND THIRD TOES;  Surgeon: Waynetta Sandy, MD;  Location: [place];  Service: Vascular;  Laterality: Right;  . ENDARTERECTOMY FEMORAL Right 08/18/2017   Procedure: ENDARTERECTOMY RIGHT SUPERFICIAL Jerene Canny;  Surgeon: Waynetta Sandy, MD;  Location: [place];  Service: Vascular;  Laterality: Right;  . FEMORAL-POPLITEAL BYPASS GRAFT Right 08/18/2017   Procedure: BYPASS GRAFT RIGHT COMMON  FEMORAL TO ABOVE KNEE POPLITEAL ARTERY USING RIGHT REVERSED GREAT SAPHENOUS VEIN;  Surgeon: Waynetta Sandy, MD;  Location: [place];  Service: Vascular;  Laterality: Right;  . LOWER EXTREMITY ANGIOGRAPHY Right 08/17/2017   Procedure: Lower Extremity Angiography;  Surgeon: Waynetta Sandy, MD;  Location: [place] [facility];  Service: Cardiovascular;  Laterality: Right;  . PERIPHERAL VASCULAR BALLOON ANGIOPLASTY Right 08/17/2017   Procedure: PERIPHERAL VASCULAR BALLOON ANGIOPLASTY;  Surgeon: Waynetta Sandy, MD;  Location: [place] [facility];  Service: Cardiovascular;  Laterality: Right;  SFA UNABLE TO CROSS  . VEIN HARVEST Right 08/18/2017   Procedure: VEIN HARVEST RIGHT GREAT SAPHENOUS;  Surgeon: Waynetta Sandy, MD;  Location: [place];  Service: Vascular;  Laterality: Right;  . WOUND DEBRIDEMENT Right 08/18/2017   Procedure: DEBRIDEMENT WOUND RIGHT FOOT;  Surgeon: Waynetta Sandy, MD;  Location: [place];  Service: Vascular;  Laterality: Right;    There were no vitals filed for this visit.              Wound Therapy - 08/28/18 1721    Subjective  pt states he has his compression sock today.  No issues or pain.    Patient and Family Stated Goals  wounds to heal    Date of Onset  --   >1 year ago   Prior Treatments  no self care, prior amputations    Pain Scale  0-10    Pain Score  0-No pain    Evaluation and Treatment Procedures Explained to Patient/Family  Yes    Evaluation and Treatment Procedures  agreed to    Wound Properties Date First Assessed: 05/26/18 Time First Assessed: 1600 Wound Type: Other (Comment) , opening plantar surface  Location: Foot Location Orientation: Right Present on Admission: Yes   Dressing Type  Hydrocolloid;Compression wrap;Gauze (Comment)   duoderm, 2x2, profore lite   Dressing Changed  Changed    Dressing Status  Old drainage    Dressing Change Frequency  PRN    Site / Wound Assessment  Clean    % Wound base  Red or Granulating  100%   red   % Wound base Yellow/Fibrinous Exudate  0%    % Wound base Other/Granulation Tissue (Comment)  0%    Peri-wound Assessment  Intact    Drainage Amount  Minimal    Drainage Description  Serous    Treatment  Cleansed;Debridement (Selective)    Selective Debridement - Location  plantar foot    Selective Debridement - Tools Used  Forceps;Scissors    Selective Debridement - Tissue Removed  Slough, dried exudate, devitalized tissue, margins, callous.    Wound Therapy - Clinical Statement  minimal drianage or debrdiement needed. REmoved dry skin and tissue from perimeter of wound and cleansed well.  Vaseline also used to borders.  COntineud with xeroform but used 2X2 and medipore this session in order to don compression sock.      Wound Therapy - Functional Problem List  Impared walking/mobility, impaired self care/ADL performance    Factors Delaying/Impairing Wound Healing  Altered sensation;Tobacco use;Vascular compromise    Hydrotherapy Plan  Debridement;Dressing change;Patient/family education;Pulsatile lavage with suction    Wound Therapy - Frequency  2X / week    Wound Therapy - Current Recommendations  PT    Wound Plan  Measure Weekly. Continue with profore lite for edema management. Continue with topical agents and debridement as appropriate for wound healing stage.                PT Short Term Goals - 06/21/18 1441      PT SHORT TERM GOAL #1   Title  Patient will have no callous present at base of Rt foot to indicate improve WB tolerance and self care.    Baseline  no callous but wound present    Time  4    Period  Weeks    Status  Partially Met    Target Date  06/21/18      PT SHORT TERM GOAL #2   Title  Wound will reduce by 50% surface area to indicate wound healing and progress towards proliferative stage of healing away from inflammatory stage.    Baseline  achieved for ant foot and medial ankle wounds    Time  4    Period  Weeks     Status  Partially Met        PT Long Term Goals - 07/17/18 1153      PT LONG TERM GOAL #1   Title  Rt medial ankle wound will be healing with 90% reducation in surface area indicating goof approximation of wound with no evidence of imparied periwound integrity.    Time  8    Period  Weeks    Status  Achieved      PT LONG TERM GOAL #2   Title  Rt foot wound will be healing with 90% reducation in surface area indicating goof approximation of wound  with no evidence of imparied periwound integrity.    Time  8    Period  Weeks    Status  On-going      PT LONG TERM GOAL #3   Title  Patient will have 75% reduced edema and if appropriate per vascular MD (Possible Dr. Donzetta Matters) patient will be independent in donnig/doffing and carign for compresison garment to manage edema of Rt LE.    Time  8    Period  Weeks    Status  On-going              Patient will benefit from skilled therapeutic intervention in order to improve the following deficits and impairments:     Visit Diagnosis: Open wound of right ankle, initial encounter  Open wound of right foot, initial encounter     Problem List Patient Active Problem List   Diagnosis Date Noted  . Status post transmetatarsal amputation of right foot ([place])   . Subacute osteomyelitis, right ankle and foot ([place])   . Infected blister of foot 08/13/2017  . Pressure injury of skin 08/12/2017  . Weakness 08/11/2017  . COPD (chronic obstructive pulmonary disease) ([place]) 08/11/2017  . Cellulitis of right lower extremity 08/11/2017  . Fall at home, initial encounter 08/11/2017  . Thrombocytopenia ([place]) 08/11/2017  . Weakness generalized 08/11/2017  . Hypokalemia 08/11/2017  . Essential hypertension 08/11/2017   Teena Irani, PTA/CLT [phone]  Teena Irani 08/28/2018, 5:25 PM  [facility] [address] [place], [place], [address] Phone: [phone]   Fax:  [phone]  Name: Fintan Grater MRN: [id number] Date of Birth: 09/30/1945

## 2018-09-01 ENCOUNTER — Ambulatory Visit (HOSPITAL_COMMUNITY): Payer: Medicare HMO | Admitting: Physical Therapy

## 2018-09-01 ENCOUNTER — Other Ambulatory Visit: Payer: Self-pay

## 2018-09-01 DIAGNOSIS — S91301A Unspecified open wound, right foot, initial encounter: Secondary | ICD-10-CM

## 2018-09-01 DIAGNOSIS — S91001A Unspecified open wound, right ankle, initial encounter: Secondary | ICD-10-CM

## 2018-09-01 NOTE — Therapy (Signed)
[place], [place], [address] Phone: [phone]   Fax:  [phone]  Wound Care Therapy  Patient Details  Name: Jorge Clements MRN: [id number] Date of Birth: Oct 14, 1945 Referring Provider (PT): Celene Squibb, MD   Encounter Date: 09/01/2018  PT End of Session - 09/01/18 1407    Visit Number  53   PN completed visit #8, #16, #26   Number of Visits  36    Date for PT Re-Evaluation  07/19/18   Minireassess 06/21/18   Authorization Type  [employer] medicare PPO ( no auth required, no visit limit)    Authorization Time Period  05/24/2018 - 07/21/2018; 07/17/18-08/07/18; 08/07/18-09/08/18    Authorization - Visit Number  6    Authorization - Number of Visits  10    PT Start Time  0820    PT Stop Time  0900    PT Time Calculation (min)  40 min    Activity Tolerance  Patient tolerated treatment well    Behavior During Therapy  [facility] for tasks assessed/performed       Past Medical History:  Diagnosis Date  . COPD (chronic obstructive pulmonary disease) ([place])     Past Surgical History:  Procedure Laterality Date  . ABDOMINAL AORTOGRAM N/A 08/17/2017   Procedure: ABDOMINAL AORTOGRAM;  Surgeon: Waynetta Sandy, MD;  Location: [place] [facility];  Service: Cardiovascular;  Laterality: N/A;  . AMPUTATION Right 08/21/2017   Procedure: TRANSMETATARSAL AMPUTATION RIGHT FOOT;  Surgeon: Newt Minion, MD;  Location: [place];  Service: Orthopedics;  Laterality: Right;  . AMPUTATION TOE Right 08/18/2017   Procedure: AMPUTATION RIGHT SECOND AND THIRD TOES;  Surgeon: Waynetta Sandy, MD;  Location: [place];  Service: Vascular;  Laterality: Right;  . ENDARTERECTOMY FEMORAL Right 08/18/2017   Procedure: ENDARTERECTOMY RIGHT SUPERFICIAL Jerene Canny;  Surgeon: Waynetta Sandy, MD;  Location: [place];  Service: Vascular;  Laterality: Right;  . FEMORAL-POPLITEAL BYPASS GRAFT Right 08/18/2017   Procedure: BYPASS GRAFT RIGHT COMMON  FEMORAL TO ABOVE KNEE POPLITEAL ARTERY USING RIGHT REVERSED GREAT SAPHENOUS VEIN;  Surgeon: Waynetta Sandy, MD;  Location: [place];  Service: Vascular;  Laterality: Right;  . LOWER EXTREMITY ANGIOGRAPHY Right 08/17/2017   Procedure: Lower Extremity Angiography;  Surgeon: Waynetta Sandy, MD;  Location: [place] [facility];  Service: Cardiovascular;  Laterality: Right;  . PERIPHERAL VASCULAR BALLOON ANGIOPLASTY Right 08/17/2017   Procedure: PERIPHERAL VASCULAR BALLOON ANGIOPLASTY;  Surgeon: Waynetta Sandy, MD;  Location: [place] [facility];  Service: Cardiovascular;  Laterality: Right;  SFA UNABLE TO CROSS  . VEIN HARVEST Right 08/18/2017   Procedure: VEIN HARVEST RIGHT GREAT SAPHENOUS;  Surgeon: Waynetta Sandy, MD;  Location: [place];  Service: Vascular;  Laterality: Right;  . WOUND DEBRIDEMENT Right 08/18/2017   Procedure: DEBRIDEMENT WOUND RIGHT FOOT;  Surgeon: Waynetta Sandy, MD;  Location: [place];  Service: Vascular;  Laterality: Right;    There were no vitals filed for this visit.              Wound Therapy - 09/01/18 0924    Subjective  pt comes today with new sock.  States he has not had any issues however did not remove his stocking.    Patient and Family Stated Goals  wounds to heal    Date of Onset  --   >1 year ago   Prior Treatments  no self care, prior amputations    Pain Scale  0-10    Pain Score  0-No pain    Evaluation and Treatment Procedures Explained to Patient/Family  Yes    Evaluation and Treatment Procedures  agreed to    Wound Properties Date First Assessed: 05/26/18 Time First Assessed: 1600 Wound Type: Other (Comment) , opening plantar surface  Location: Foot Location Orientation: Right Present on Admission: Yes   Dressing Type  Hydrocolloid;Compression wrap;Gauze (Comment)   duoderm, 2x2, profore lite   Dressing Changed  Changed    Dressing Status  Old drainage    Dressing Change Frequency  PRN    Site / Wound  Assessment  Clean    % Wound base Red or Granulating  100%   red   % Wound base Yellow/Fibrinous Exudate  0%    % Wound base Other/Granulation Tissue (Comment)  0%    Peri-wound Assessment  Intact    Wound Length (cm)  0.3 cm    Wound Width (cm)  0.2 cm    Wound Depth (cm)  0 cm    Wound Volume (cm^3)  0 cm^3    Wound Surface Area (cm^2)  0.06 cm^2    Drainage Amount  None    Drainage Description  --    Treatment  Cleansed;Debridement (Selective)    Selective Debridement - Location  plantar foot    Selective Debridement - Tools Used  Forceps;Scissors    Selective Debridement - Tissue Removed  Slough, dried exudate, devitalized tissue, margins, callous.    Wound Therapy - Clinical Statement  no draiange, no open areas remaining, however dry skin and tissue periemter.  Instrcuted pt to remove sock and moisturize well each night.  Anticpate full healing with abiolity to be discharged next session.  covered with xeroform, gauze and medipore just to keep cushioned.  Pt able to don/doff compression sock indendently.      Wound Therapy - Functional Problem List  Impared walking/mobility, impaired self care/ADL performance    Factors Delaying/Impairing Wound Healing  Altered sensation;Tobacco use;Vascular compromise    Hydrotherapy Plan  Debridement;Dressing change;Patient/family education;Pulsatile lavage with suction    Wound Therapy - Frequency  2X / week    Wound Therapy - Current Recommendations  PT    Wound Plan  Measure Weekly. Continue with profore lite for edema management. Continue with topical agents and debridement as appropriate for wound healing stage.      [place] Adult PT Treatment/Exercise - 09/01/18 0001      Manual Therapy   Manual Therapy  Other (comment)    Other Manual Therapy  worked on donning/doffing             PT Education - 09/01/18 1408    Education Details  worked on [employer].  Pt able to complete independenty (some verbal cues to get started)      Person(s) Educated  Patient    Methods  Explanation;Demonstration;Verbal cues    Comprehension  Verbalized understanding;Returned demonstration       PT Short Term Goals - 06/21/18 1441      PT SHORT TERM GOAL #1   Title  Patient will have no callous present at base of Rt foot to indicate improve WB tolerance and self care.    Baseline  no callous but wound present    Time  4    Period  Weeks    Status  Partially Met    Target Date  06/21/18      PT SHORT TERM GOAL #2   Title  Wound  will reduce by 50% surface area to indicate wound healing and progress towards proliferative stage of healing away from inflammatory stage.    Baseline  achieved for ant foot and medial ankle wounds    Time  4    Period  Weeks    Status  Partially Met        PT Long Term Goals - 07/17/18 1153      PT LONG TERM GOAL #1   Title  Rt medial ankle wound will be healing with 90% reducation in surface area indicating goof approximation of wound with no evidence of imparied periwound integrity.    Time  8    Period  Weeks    Status  Achieved      PT LONG TERM GOAL #2   Title  Rt foot wound will be healing with 90% reducation in surface area indicating goof approximation of wound with no evidence of imparied periwound integrity.    Time  8    Period  Weeks    Status  On-going      PT LONG TERM GOAL #3   Title  Patient will have 75% reduced edema and if appropriate per vascular MD (Possible Dr. Donzetta Matters) patient will be independent in donnig/doffing and carign for compresison garment to manage edema of Rt LE.    Time  8    Period  Weeks    Status  On-going              Patient will benefit from skilled therapeutic intervention in order to improve the following deficits and impairments:     Visit Diagnosis: Open wound of right ankle, initial encounter  Open wound of right foot, initial encounter     Problem List Patient Active Problem List   Diagnosis Date Noted  . Status post  transmetatarsal amputation of right foot ([place])   . Subacute osteomyelitis, right ankle and foot ([place])   . Infected blister of foot 08/13/2017  . Pressure injury of skin 08/12/2017  . Weakness 08/11/2017  . COPD (chronic obstructive pulmonary disease) ([place]) 08/11/2017  . Cellulitis of right lower extremity 08/11/2017  . Fall at home, initial encounter 08/11/2017  . Thrombocytopenia ([place]) 08/11/2017  . Weakness generalized 08/11/2017  . Hypokalemia 08/11/2017  . Essential hypertension 08/11/2017   Teena Irani, PTA/CLT [phone]  Teena Irani 09/01/2018, 2:11 PM  [facility] [address] [place], [place], [address] Phone: [phone]   Fax:  [phone]  Name: Chay Mazzoni MRN: [id number] Date of Birth: [birth year]

## 2018-09-04 ENCOUNTER — Ambulatory Visit (HOSPITAL_COMMUNITY): Payer: Medicare HMO

## 2018-09-04 ENCOUNTER — Other Ambulatory Visit: Payer: Self-pay

## 2018-09-04 ENCOUNTER — Encounter (HOSPITAL_COMMUNITY): Payer: Self-pay

## 2018-09-04 DIAGNOSIS — S91001A Unspecified open wound, right ankle, initial encounter: Secondary | ICD-10-CM | POA: Diagnosis not present

## 2018-09-04 DIAGNOSIS — S91301A Unspecified open wound, right foot, initial encounter: Secondary | ICD-10-CM

## 2018-09-04 NOTE — Therapy (Signed)
[place], [place], [address] Phone: [phone]   Fax:  [phone]  Wound Care Therapy Discharge Summary  Patient Details  Name: Jorge Clements MRN: [id number] Date of Birth: October 07, 1945 Referring Provider (PT): Celene Squibb, MD   Encounter Date: 09/04/2018   PHYSICAL THERAPY DISCHARGE SUMMARY  Visits from Start of Care: 34  Current functional level related to goals / functional outcomes: Patient's wound has completely healed and is now covered with keratinized skin. He has acquired the proper compression stockings and has been educated on proper daily skin care, cleansing with gentle soap, moisturize immediately after washing, and to wear compression socks during the day but remove at night. He was educated to wash socks with gentle detergent and air dry to maintain elasticity. Patient has demonstrated independence with donning/doffing socks and will be discharged as he no longer requires skilled wound care.   Remaining deficits: See below details   Education / Equipment: Educated on proper skin care with cleansing and moisturizing. Educated on compression sock care and to change sock daily.   Plan: Patient agrees to discharge.  Patient goals were met. Patient is being discharged due to meeting the stated rehab goals.  ?????       PT End of Session - 09/04/18 1215    Visit Number  34   PN completed visit #8, #16, #26   Number of Visits  36    Date for PT Re-Evaluation  07/19/18   Minireassess 06/21/18   Authorization Type  [employer] medicare PPO ( no auth required, no visit limit)    Authorization Time Period  05/24/2018 - 07/21/2018; 07/17/18-08/07/18; 08/07/18-09/08/18    Authorization - Visit Number  7    Authorization - Number of Visits  10    PT Start Time  1034    PT Stop Time  1052    PT Time Calculation (min)  18 min    Activity Tolerance  Patient tolerated treatment well    Behavior During Therapy  WFL for  tasks assessed/performed       Past Medical History:  Diagnosis Date  . COPD (chronic obstructive pulmonary disease) ([place])     Past Surgical History:  Procedure Laterality Date  . ABDOMINAL AORTOGRAM N/A 08/17/2017   Procedure: ABDOMINAL AORTOGRAM;  Surgeon: Waynetta Sandy, MD;  Location: [facility];  Service: Cardiovascular;  Laterality: N/A;  . AMPUTATION Right 08/21/2017   Procedure: TRANSMETATARSAL AMPUTATION RIGHT FOOT;  Surgeon: Newt Minion, MD;  Location: [facility];  Service: Orthopedics;  Laterality: Right;  . AMPUTATION TOE Right 08/18/2017   Procedure: AMPUTATION RIGHT SECOND AND THIRD TOES;  Surgeon: Waynetta Sandy, MD;  Location: [facility];  Service: Vascular;  Laterality: Right;  . ENDARTERECTOMY FEMORAL Right 08/18/2017   Procedure: ENDARTERECTOMY RIGHT SUPERFICIAL Jerene Canny;  Surgeon: Waynetta Sandy, MD;  Location: [facility];  Service: Vascular;  Laterality: Right;  . FEMORAL-POPLITEAL BYPASS GRAFT Right 08/18/2017   Procedure: BYPASS GRAFT RIGHT COMMON FEMORAL TO ABOVE KNEE POPLITEAL ARTERY USING RIGHT REVERSED GREAT SAPHENOUS VEIN;  Surgeon: Waynetta Sandy, MD;  Location: [facility];  Service: Vascular;  Laterality: Right;  . LOWER EXTREMITY ANGIOGRAPHY Right 08/17/2017   Procedure: Lower Extremity Angiography;  Surgeon: Waynetta Sandy, MD;  Location: [facility];  Service: Cardiovascular;  Laterality: Right;  . PERIPHERAL VASCULAR BALLOON ANGIOPLASTY Right 08/17/2017   Procedure: PERIPHERAL VASCULAR BALLOON ANGIOPLASTY;  Surgeon: Waynetta Sandy, MD;  Location: [facility]  INVASIVE CV LAB;  Service: Cardiovascular;  Laterality: Right;  SFA UNABLE TO CROSS  . VEIN HARVEST Right 08/18/2017   Procedure: VEIN HARVEST RIGHT GREAT SAPHENOUS;  Surgeon: Waynetta Sandy, MD;  Location: [facility];  Service: Vascular;  Laterality: Right;  . WOUND DEBRIDEMENT Right 08/18/2017   Procedure: DEBRIDEMENT WOUND RIGHT FOOT;  Surgeon:  Waynetta Sandy, MD;  Location: [facility];  Service: Vascular;  Laterality: Right;    There were no vitals filed for this visit.    Wound Therapy - 09/04/18 1209    Subjective  Patient arrives with sock on and reports he has not taken it off since last being here on Friday. He states he knows he was instructed to take it off at night but didn't do so. He has about 8 socks he can alternate through.    Patient and Family Stated Goals  wounds to heal    Date of Onset  --   >1 year ago   Prior Treatments  no self care, prior amputations    Pain Scale  0-10    Pain Score  0-No pain    Evaluation and Treatment Procedures Explained to Patient/Family  Yes    Evaluation and Treatment Procedures  agreed to    Wound Properties Date First Assessed: 05/26/18 Time First Assessed: 1600 Wound Type: Other (Comment) , opening plantar surface  Location: Foot Location Orientation: Right Present on Admission: Yes Final Assessment Date: 09/04/18 Final Assessment Time: 1034   Dressing Type  Hydrocolloid;Compression wrap;Gauze (Comment)   duoderm, 2x2, profore lite   Dressing Changed  Other (Comment)   no dressing needed   Dressing Status  Old drainage    Dressing Change Frequency  PRN    Site / Wound Assessment  Clean    % Wound base Red or Granulating  0%    % Wound base Yellow/Fibrinous Exudate  0%    % Wound base Black/Eschar  0%    % Wound base Other/Granulation Tissue (Comment)  100%   intact healed keritanized skin   Peri-wound Assessment  Intact    Wound Length (cm)  0 cm    Wound Width (cm)  0 cm    Wound Depth (cm)  0 cm    Wound Volume (cm^3)  0 cm^3    Wound Surface Area (cm^2)  0 cm^2    Drainage Amount  None    Treatment  Cleansed    Selective Debridement - Location  --    Selective Debridement - Tools Used  --    Selective Debridement - Tissue Removed  --    Wound Therapy - Clinical Statement  Patient's wound has completely healed and is now covered with keratinized skin. He has  acquired the proper compression stockings and has been educated on proper daily skin care, cleansing with gentle soap, moisturize immediately after washing, and to wear compression socks during the day but remove at night. He was educated to wash socks with gentle detergent and air dry to maintain elasticity. Patient has demonstrated independence with donning/doffing socks and will be discharged as he no longer requires skilled wound care.    Wound Therapy - Functional Problem List  Impared walking/mobility, impaired self care/ADL performance    Factors Delaying/Impairing Wound Healing  Altered sensation;Tobacco use;Vascular compromise    Hydrotherapy Plan  Debridement;Dressing change;Patient/family education;Pulsatile lavage with suction    Wound Therapy - Frequency  2X / week    Wound Therapy - Current Recommendations  PT  Wound Plan  Discharge with compression socks and instructed on self care.        PT Education - 09/04/18 1215    Education Details  Educated on proper skin care with cleansing and moisturizing. Educated on compression sock care and to change sock daily.    Person(s) Educated  Patient    Methods  Explanation    Comprehension  Verbalized understanding       PT Short Term Goals - 09/04/18 1216      PT SHORT TERM GOAL #1   Title  Patient will have no callous present at base of Rt foot to indicate improve WB tolerance and self care.    Baseline  no callous but wound present    Time  4    Period  Weeks    Status  Achieved    Target Date  06/21/18      PT SHORT TERM GOAL #2   Title  Wound will reduce by 50% surface area to indicate wound healing and progress towards proliferative stage of healing away from inflammatory stage.    Baseline  achieved for ant foot and medial ankle wounds    Time  4    Period  Weeks    Status  Achieved        PT Long Term Goals - 09/04/18 1216      PT LONG TERM GOAL #1   Title  Rt medial ankle wound will be healing with 90%  reducation in surface area indicating goof approximation of wound with no evidence of imparied periwound integrity.    Time  8    Period  Weeks    Status  Achieved      PT LONG TERM GOAL #2   Title  Rt foot wound will be healing with 90% reducation in surface area indicating goof approximation of wound with no evidence of imparied periwound integrity.    Time  8    Period  Weeks    Status  Achieved      PT LONG TERM GOAL #3   Title  Patient will have 75% reduced edema and if appropriate per vascular MD (Possible Dr. Donzetta Matters) patient will be independent in donnig/doffing and carign for compresison garment to manage edema of Rt LE.    Time  8    Period  Weeks    Status  Achieved       Plan - 09/04/18 1216    Clinical Impression Statement  see above    Rehab Potential  Fair    Clinical Impairments Affecting Rehab Potential  (-) chronic woudn history, (-) decreaed self care motivation    PT Frequency  2x / week    PT Duration  3 weeks    PT Treatment/Interventions  ADLs/Self Care Home Management;Compression bandaging;Manual techniques;Patient/family education   skilled wound care, selective debridment, compression wrapping   PT Next Visit Plan  see above    Consulted and Agree with Plan of Care  Patient       Patient will benefit from skilled therapeutic intervention in order to improve the following deficits and impairments:  Abnormal gait, Decreased balance, Decreased skin integrity, Increased edema, Impaired sensation, Decreased mobility, Difficulty walking  Visit Diagnosis: Open wound of right ankle, initial encounter  Open wound of right foot, initial encounter     Problem List Patient Active Problem List   Diagnosis Date Noted  . Status post transmetatarsal amputation of right foot ([place])   . Subacute osteomyelitis, right  ankle and foot ([place])   . Infected blister of foot 08/13/2017  . Pressure injury of skin 08/12/2017  . Weakness 08/11/2017  . COPD (chronic  obstructive pulmonary disease) ([place]) 08/11/2017  . Cellulitis of right lower extremity 08/11/2017  . Fall at home, initial encounter 08/11/2017  . Thrombocytopenia ([place]) 08/11/2017  . Weakness generalized 08/11/2017  . Hypokalemia 08/11/2017  . Essential hypertension 08/11/2017    Kipp Brood, PT, DPT, [facility] Physical Therapist with [facility]  09/04/2018 12:17 PM    [place] [address] [place], [place], [address] Phone: [phone]   Fax:  [phone]  Name: Jorge Clements MRN: [id number] Date of Birth: Dec 11, 1945

## 2018-09-08 ENCOUNTER — Ambulatory Visit (HOSPITAL_COMMUNITY): Payer: Medicare HMO | Admitting: Physical Therapy

## 2018-09-12 ENCOUNTER — Ambulatory Visit (HOSPITAL_COMMUNITY): Payer: Medicare HMO

## 2018-09-13 ENCOUNTER — Ambulatory Visit (HOSPITAL_COMMUNITY): Payer: Medicare HMO

## 2018-09-15 ENCOUNTER — Ambulatory Visit (HOSPITAL_COMMUNITY): Payer: Medicare HMO

## 2018-09-18 ENCOUNTER — Ambulatory Visit (HOSPITAL_COMMUNITY): Payer: Self-pay | Admitting: Physical Therapy

## 2018-09-22 ENCOUNTER — Ambulatory Visit (HOSPITAL_COMMUNITY): Payer: Self-pay | Admitting: Physical Therapy

## 2018-11-07 ENCOUNTER — Other Ambulatory Visit: Payer: Self-pay

## 2018-11-07 DIAGNOSIS — I779 Disorder of arteries and arterioles, unspecified: Secondary | ICD-10-CM

## 2018-11-10 ENCOUNTER — Ambulatory Visit: Payer: Medicare HMO | Admitting: Vascular Surgery

## 2018-11-10 ENCOUNTER — Ambulatory Visit (HOSPITAL_COMMUNITY)
Admission: RE | Admit: 2018-11-10 | Discharge: 2018-11-10 | Disposition: A | Discharge status: Home / Self Care | Payer: Medicare HMO | Source: Ambulatory Visit | Attending: Family | Admitting: Family

## 2018-11-10 ENCOUNTER — Other Ambulatory Visit: Payer: Self-pay

## 2018-11-10 ENCOUNTER — Ambulatory Visit (INDEPENDENT_AMBULATORY_CARE_PROVIDER_SITE_OTHER)
Admission: RE | Admit: 2018-11-10 | Discharge: 2018-11-10 | Disposition: A | Discharge status: Home / Self Care | Payer: Medicare HMO | Source: Ambulatory Visit | Attending: Family | Admitting: Family

## 2018-11-10 VITALS — BP 125/67 | HR 69 | Temp 97.9°F | Resp 14 | Ht 72.0 in | Wt 238.3 lb

## 2018-11-10 DIAGNOSIS — I779 Disorder of arteries and arterioles, unspecified: Secondary | ICD-10-CM

## 2018-11-10 NOTE — Progress Notes (Signed)
Patient ID: Jorge KusterMichael Clements, male   DOB: 04/14/1946, 73 y.o.   MRN: [id number]  Reason for Consult: PAD (Pt denies any new concerns today)   Referred by Benita StabileHall, John Z, MD  Subjective:     HPI:  Jorge KusterMichael Clements is a 73 y.o. male previous history of right femoral to above-knee bypass graft by myself followed by transmetatarsal amputation.  Postop was complicated by significant swelling.  This is now resolved.  He is walking without limitation.  Denies any further pain.  States that he is really gotten back to pretty much normal at this time overall feeling well.  Past Medical History:  Diagnosis Date  . COPD (chronic obstructive pulmonary disease) ([facility])    Family History  Adopted: Yes   Past Surgical History:  Procedure Laterality Date  . ABDOMINAL AORTOGRAM N/A 08/17/2017   Procedure: ABDOMINAL AORTOGRAM;  Surgeon: Maeola Harmanain, Nyree Applegate Christopher, MD;  [facility];  Service: Cardiovascular;  Laterality: N/A;  . AMPUTATION Right 08/21/2017   Procedure: TRANSMETATARSAL AMPUTATION RIGHT FOOT;  Surgeon: Nadara Mustarduda, Marcus V, MD;  [facility];  Service: Orthopedics;  Laterality: Right;  . AMPUTATION TOE Right 08/18/2017   Procedure: AMPUTATION RIGHT SECOND AND THIRD TOES;  Surgeon: Maeola Harmanain, Lurlean Kernen Christopher, MD;  [facility];  Service: Vascular;  Laterality: Right;  . ENDARTERECTOMY FEMORAL Right 08/18/2017   Procedure: ENDARTERECTOMY RIGHT SUPERFICIAL Alfredia FergusonFEMORAL/PROFUNDA;  Surgeon: Maeola Harmanain, Christien Frankl Christopher, MD;  [facility];  Service: Vascular;  Laterality: Right;  . FEMORAL-POPLITEAL BYPASS GRAFT Right 08/18/2017   Procedure: BYPASS GRAFT RIGHT COMMON FEMORAL TO ABOVE KNEE POPLITEAL ARTERY USING RIGHT REVERSED GREAT SAPHENOUS VEIN;  Surgeon: Maeola Harmanain, Sophiya Morello Christopher, MD;  [facility];  Service: Vascular;  Laterality: Right;  . LOWER EXTREMITY ANGIOGRAPHY Right 08/17/2017   Procedure: Lower Extremity Angiography;  Surgeon: Maeola Harmanain, Tison Leibold Christopher, MD;  [facility];   Service: Cardiovascular;  Laterality: Right;  . PERIPHERAL VASCULAR BALLOON ANGIOPLASTY Right 08/17/2017   Procedure: PERIPHERAL VASCULAR BALLOON ANGIOPLASTY;  Surgeon: Maeola Harmanain, Yosselin Zoeller Christopher, MD;  [facility];  Service: Cardiovascular;  Laterality: Right;  SFA UNABLE TO CROSS  . VEIN HARVEST Right 08/18/2017   Procedure: VEIN HARVEST RIGHT GREAT SAPHENOUS;  Surgeon: Maeola Harmanain, Tyan Lasure Christopher, MD;  [facility];  Service: Vascular;  Laterality: Right;  . WOUND DEBRIDEMENT Right 08/18/2017   Procedure: DEBRIDEMENT WOUND RIGHT FOOT;  Surgeon: Maeola Harmanain, Tobyn Osgood Christopher, MD;  [facility];  Service: Vascular;  Laterality: Right;    Short Social History:  Social History   Tobacco Use  . Smoking status: Former Games developermoker  . Smokeless tobacco: Never Used  Substance Use Topics  . Alcohol use: Not Currently    Allergies  Allergen Reactions  . Penicillins Hives and Rash    Childhood reaction Has patient had a PCN reaction causing immediate rash, facial/tongue/throat swelling, SOB or lightheadedness with hypotension: No PT DEVELOPED ## SEVERE ## RASH INVOLVING MUCUS MEMBRANES or SKIN NECROSIS: #  #  YES  #  # Has patient had a PCN reaction that required hospitalization: Unknown Has patient had a PCN reaction occurring within the last 10 years: No  . Clindamycin Rash    Diffuse rash across body    Current Outpatient Medications  Medication Sig Dispense Refill  . Albuterol Sulfate 108 (90 Base) MCG/ACT AEPB Inhale 90 mcg into the lungs.    Marland Kitchen. aspirin 325 MG tablet Take 325 mg by mouth daily. As a buffered tablet antiplatelet    .  budesonide-formoterol (SYMBICORT) 160-4.5 MCG/ACT inhaler Inhale 2 puffs into the lungs 2 (two) times daily.    . enalapril (VASOTEC) 10 MG tablet Take 10 mg by mouth daily.    . hydrochlorothiazide (MICROZIDE) 12.5 MG capsule Take 12.5 mg by mouth daily.    . Multiple Vitamins-Minerals (CENTRUM SILVER 50+MEN PO) Take by mouth.    . rosuvastatin  (CRESTOR) 10 MG tablet Take 1 tablet (10 mg total) by mouth daily.    . temazepam (RESTORIL) 30 MG capsule Take 30 mg by mouth at bedtime as needed for sleep.     No current facility-administered medications for this visit.     Review of Systems  Constitutional:  Constitutional negative. HENT: HENT negative.  Eyes: Eyes negative.  Respiratory: Respiratory negative.  Cardiovascular: Cardiovascular negative.  GI: Gastrointestinal negative.  Musculoskeletal: Musculoskeletal negative.  Skin: Skin negative.  Neurological: Neurological negative. Hematologic: Hematologic/lymphatic negative.  Psychiatric: Psychiatric negative.        Objective:  Objective   Vitals:   11/10/18 1359  BP: 125/67  Pulse: 69  Resp: 14  Temp: 97.9 F (36.6 C)  TempSrc: Temporal  SpO2: 97%  Weight: 238 lb 4.8 oz (108.1 kg)  Height: 6' (1.829 m)   Body mass index is 32.32 kg/m.  Physical Exam Constitutional:      Appearance: Normal appearance.  HENT:     Head: Normocephalic.     Nose: Nose normal.  Eyes:     Pupils: Pupils are equal, round, and reactive to light.  Cardiovascular:     Rate and Rhythm: Normal rate.     Pulses:          Femoral pulses are 2+ on the right side and 2+ on the left side.      Popliteal pulses are 0 on the left side.       Dorsalis pedis pulses are 2+ on the right side and 0 on the left side.  Pulmonary:     Breath sounds: Normal breath sounds.  Abdominal:     Palpations: Abdomen is soft. There is no mass.  Musculoskeletal: Normal range of motion.        General: No swelling.  Skin:    General: Skin is warm and dry.  Neurological:     General: No focal deficit present.     Mental Status: He is alert.  Psychiatric:        Mood and Affect: Mood normal.     Data: I have independently interpreted his bypass graft duplex and ABIs which are 0.82 right 0.46 left.  Duplex has monophasic waveforms throughout with a 50 to 74% stenosis with lowest velocity 40 cm/s.      Assessment/Plan:    73 year old male after right femoropopliteal bypass appears to have a stenosis there.  He has healed his transmetatarsal amputation has a palpable pulse.  Given that he has of 40 cm/s velocity I think we should follow him up in the near future with repeat duplex.  If he gets any lower he will need angiography with possible intervention and I discussed this with him today.  Palpable pulse and heel wound I do not think he needs any urgent intervention.  He will call us if he has issues prior otherwise see him in 3 months with repeat studies.      Waynetta Sandy MD [facility]

## 2018-11-18 IMAGING — CT CT HEAD W/O CM
3 series · 16 of 47 positions shown, 19 images · non-contrast
Comparison: None.

CLINICAL DATA: Syncope.  COPD.

EXAM:
CT HEAD WITHOUT CONTRAST
TECHNIQUE: Contiguous axial images were obtained from the base of the skull
through the vertex without intravenous contrast.

[Series 2: head trauma wo · axial · 0.47mm/px · z∈[+116,+241]mm · 10 of 31 slices shown, 13 images]
[im 3/31  brain]
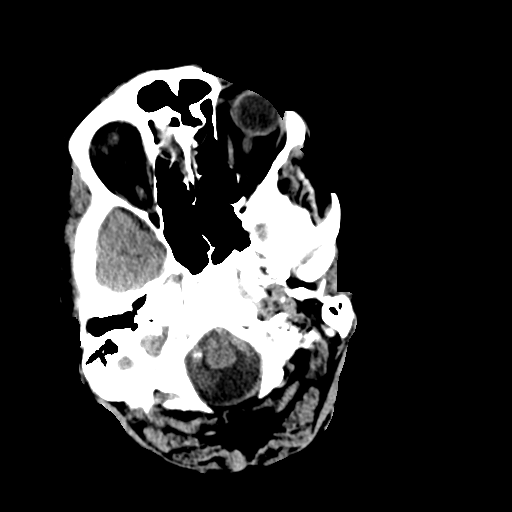
[im 3/31  bone]
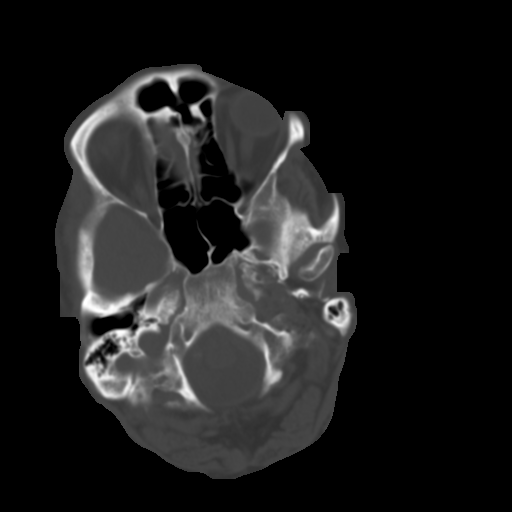
[im 6/31  brain]
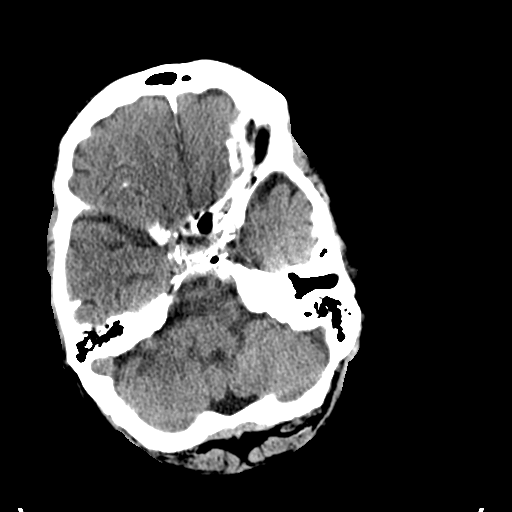
[im 9/31  brain]
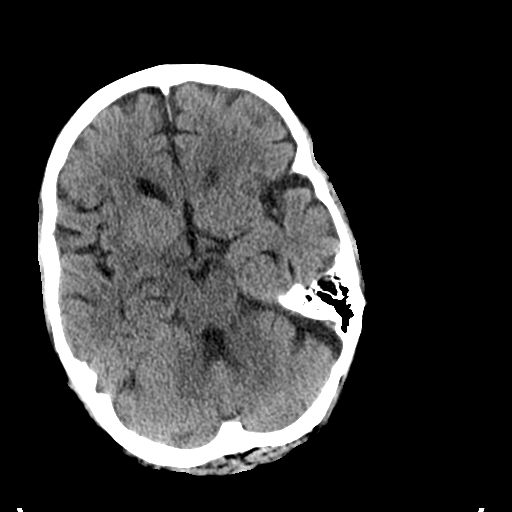
[im 11/31  brain]
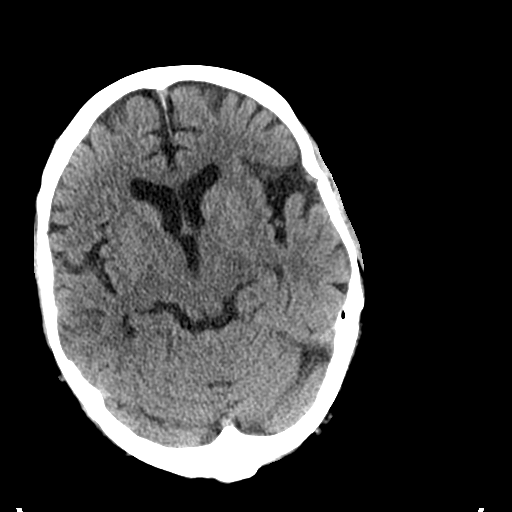
[im 14/31  brain]
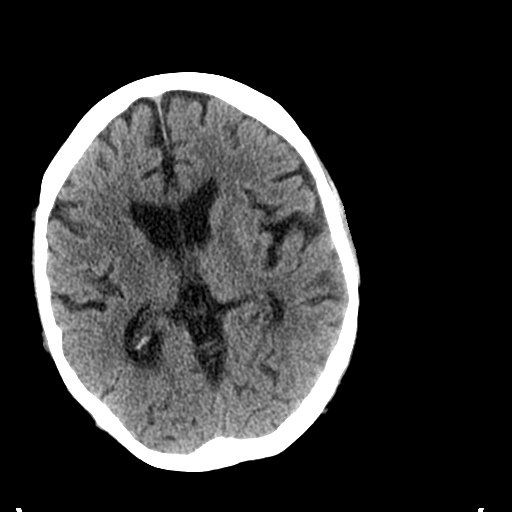
[im 14/31  bone]
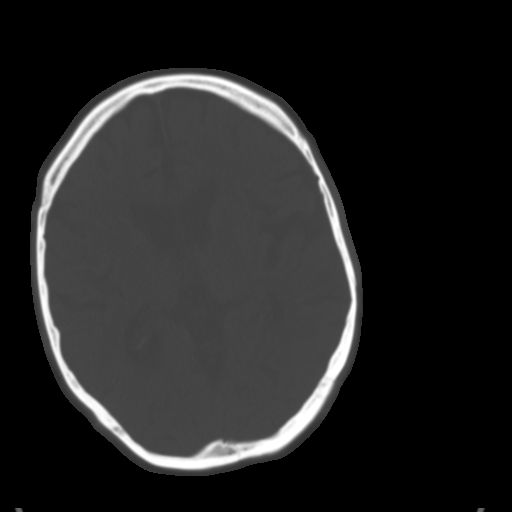
[im 17/31  brain]
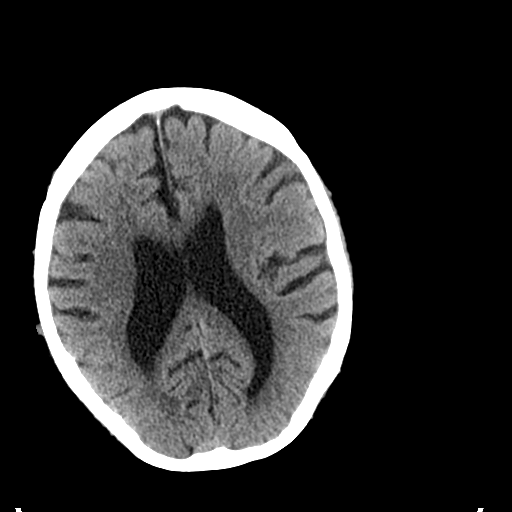
[im 20/31  brain]
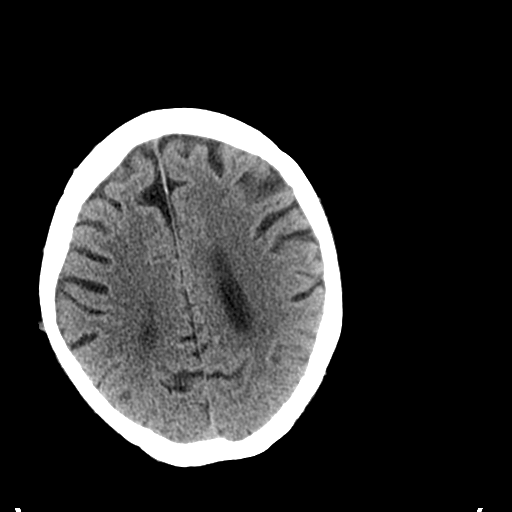
[im 23/31  brain]
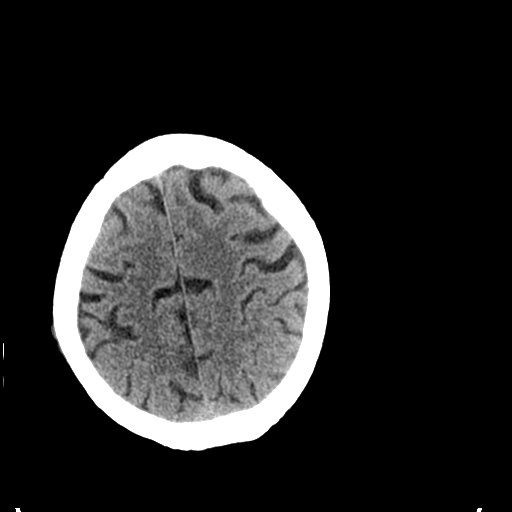
[im 25/31  brain]
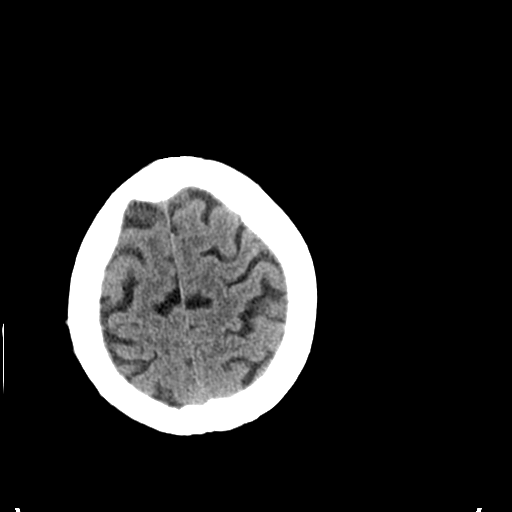
[im 25/31  bone]
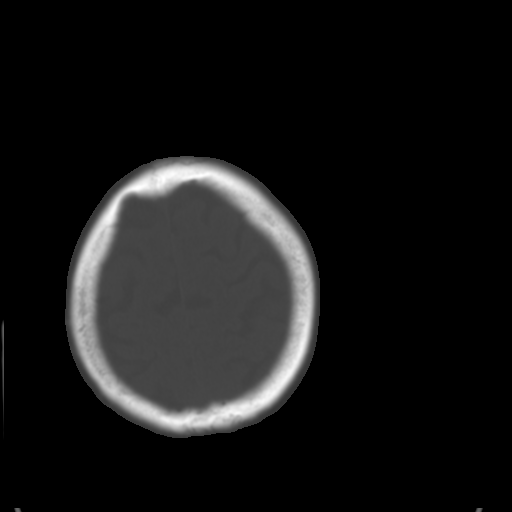
[im 28/31  brain]
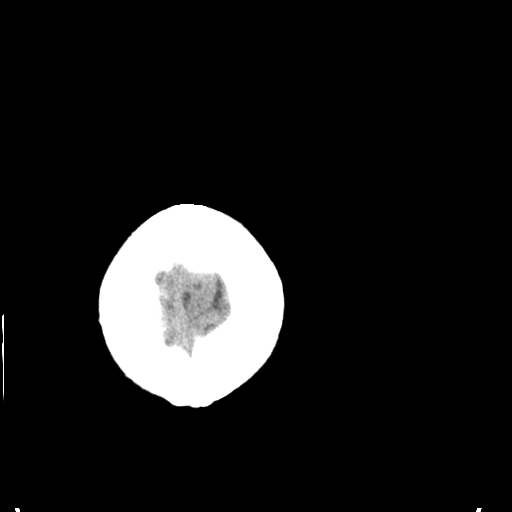

[Series 4: coronal soft tissue · coronal · 0.32mm/px · 3 of 70 slices shown]
[im 24/70  brain]
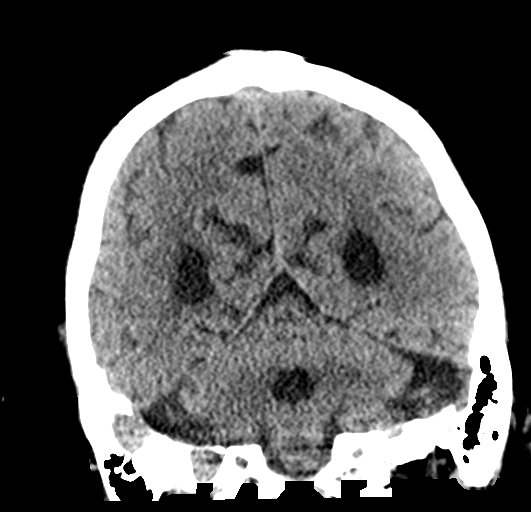
[im 31/70  brain]
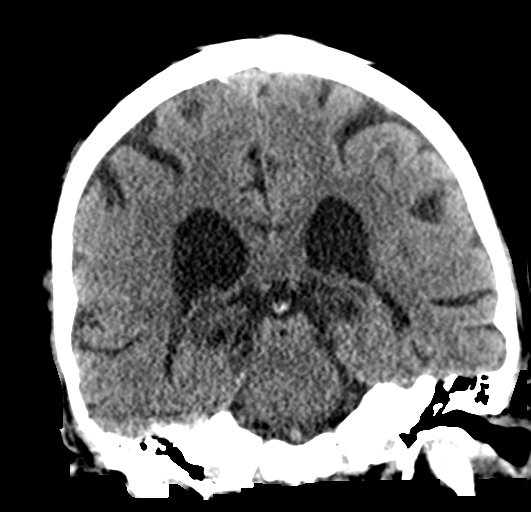
[im 39/70  brain]
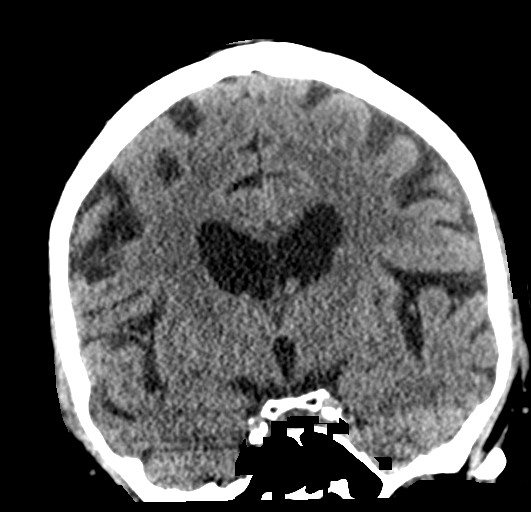

[Series 5: sagittal soft tissue · sagittal · 0.31mm/px · 3 of 56 slices shown]
[im 20/56  brain]
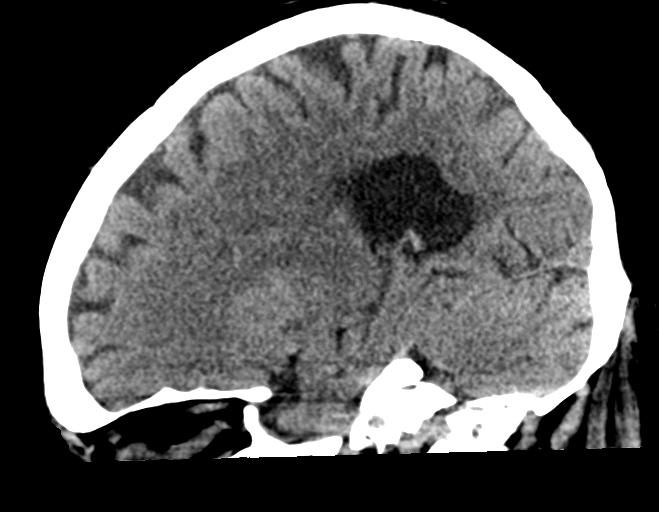
[im 28/56  brain]
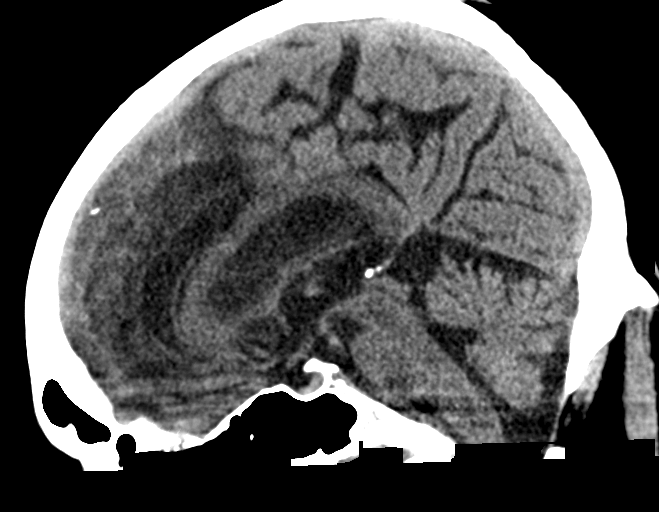
[im 36/56  brain]
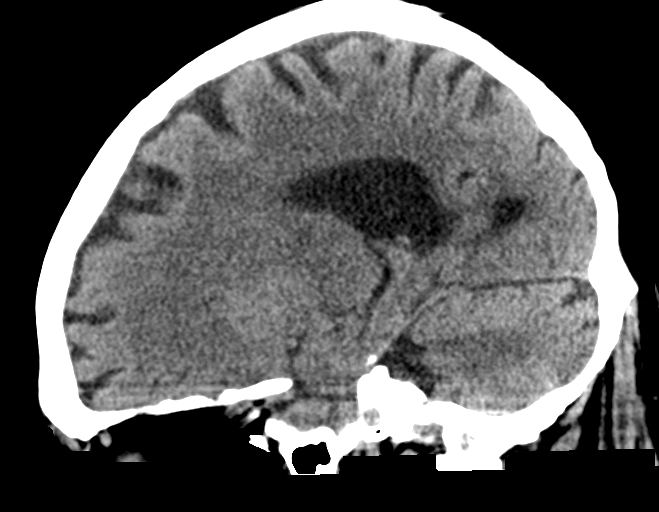

[16 of 47 positions shown; findings below may reference images not displayed]

FINDINGS: Brain: No evidence of acute infarction, hemorrhage, extra-axial
collection, ventriculomegaly, or mass effect. Generalized cerebral
atrophy. Periventricular white matter low attenuation likely
secondary to microangiopathy.

Vascular: Cerebrovascular atherosclerotic calcifications are noted.

Skull: Negative for fracture or focal lesion.

Sinuses/Orbits: Visualized portions of the orbits are unremarkable.
Visualized portions of the paranasal sinuses and mastoid air cells
are unremarkable.

Other: None.
IMPRESSION: No acute intracranial pathology.

## 2018-12-15 DIAGNOSIS — R7303 Prediabetes: Secondary | ICD-10-CM | POA: Diagnosis not present

## 2018-12-15 DIAGNOSIS — E782 Mixed hyperlipidemia: Secondary | ICD-10-CM | POA: Diagnosis not present

## 2018-12-15 DIAGNOSIS — R7301 Impaired fasting glucose: Secondary | ICD-10-CM | POA: Diagnosis not present

## 2018-12-15 DIAGNOSIS — I10 Essential (primary) hypertension: Secondary | ICD-10-CM | POA: Diagnosis not present

## 2018-12-20 DIAGNOSIS — Z89421 Acquired absence of other right toe(s): Secondary | ICD-10-CM | POA: Diagnosis not present

## 2018-12-20 DIAGNOSIS — I10 Essential (primary) hypertension: Secondary | ICD-10-CM | POA: Diagnosis not present

## 2018-12-20 DIAGNOSIS — I7025 Atherosclerosis of native arteries of other extremities with ulceration: Secondary | ICD-10-CM | POA: Diagnosis not present

## 2018-12-20 DIAGNOSIS — J449 Chronic obstructive pulmonary disease, unspecified: Secondary | ICD-10-CM | POA: Diagnosis not present

## 2018-12-20 DIAGNOSIS — R7301 Impaired fasting glucose: Secondary | ICD-10-CM | POA: Diagnosis not present

## 2018-12-20 DIAGNOSIS — I739 Peripheral vascular disease, unspecified: Secondary | ICD-10-CM | POA: Diagnosis not present

## 2018-12-20 DIAGNOSIS — E782 Mixed hyperlipidemia: Secondary | ICD-10-CM | POA: Diagnosis not present

## 2018-12-20 DIAGNOSIS — R7303 Prediabetes: Secondary | ICD-10-CM | POA: Diagnosis not present

## 2018-12-20 DIAGNOSIS — L97319 Non-pressure chronic ulcer of right ankle with unspecified severity: Secondary | ICD-10-CM | POA: Diagnosis not present

## 2018-12-20 DIAGNOSIS — D696 Thrombocytopenia, unspecified: Secondary | ICD-10-CM | POA: Diagnosis not present

## 2019-01-18 ENCOUNTER — Other Ambulatory Visit: Payer: Self-pay

## 2019-01-18 DIAGNOSIS — I779 Disorder of arteries and arterioles, unspecified: Secondary | ICD-10-CM

## 2019-01-18 DIAGNOSIS — I714 Abdominal aortic aneurysm, without rupture, unspecified: Secondary | ICD-10-CM

## 2019-01-25 ENCOUNTER — Telehealth (HOSPITAL_COMMUNITY): Payer: Self-pay

## 2019-01-25 NOTE — Telephone Encounter (Signed)

## 2019-01-26 ENCOUNTER — Ambulatory Visit (INDEPENDENT_AMBULATORY_CARE_PROVIDER_SITE_OTHER)
Admission: RE | Admit: 2019-01-26 | Discharge: 2019-01-26 | Disposition: A | Discharge status: Home / Self Care | Payer: Medicare HMO | Source: Ambulatory Visit | Attending: Vascular Surgery | Admitting: Vascular Surgery

## 2019-01-26 ENCOUNTER — Ambulatory Visit: Payer: Medicare HMO | Admitting: Vascular Surgery

## 2019-01-26 ENCOUNTER — Other Ambulatory Visit: Payer: Self-pay

## 2019-01-26 ENCOUNTER — Ambulatory Visit (HOSPITAL_COMMUNITY)
Admission: RE | Admit: 2019-01-26 | Discharge: 2019-01-26 | Disposition: A | Discharge status: Home / Self Care | Payer: Medicare HMO | Source: Ambulatory Visit | Attending: Vascular Surgery | Admitting: Vascular Surgery

## 2019-01-26 ENCOUNTER — Encounter: Payer: Self-pay | Admitting: Vascular Surgery

## 2019-01-26 VITALS — BP 116/55 | HR 64 | Temp 97.4°F | Resp 20 | Ht 72.0 in | Wt 248.4 lb

## 2019-01-26 DIAGNOSIS — I714 Abdominal aortic aneurysm, without rupture, unspecified: Secondary | ICD-10-CM

## 2019-01-26 DIAGNOSIS — I779 Disorder of arteries and arterioles, unspecified: Secondary | ICD-10-CM | POA: Diagnosis not present

## 2019-01-26 NOTE — Progress Notes (Signed)
Patient ID: Jorge Clements, male   DOB: 1945-09-07, 73 y.o.   MRN: [id number]  Reason for Consult: Follow-up   Referred by Benita Stabile, MD  Subjective:     HPI:  Jorge Clements is a 73 y.o. male with previous right femoral to above-knee bypass with vein also underwent transmetatarsal amputation.  Postop was complicated by extreme swelling.  Mostly this has resolved.  Does not have any more wounds.  He has gained some weight recently as he has been eating well.  Did have an aortic duplex today.  No abdominal pain with walking.  Denies any new issues.  He does take aspirin Crestor daily.  He is a previous heavy smoker and drinker but quit.  Past Medical History:  Diagnosis Date  . AAA (abdominal aortic aneurysm) (HCC)   . COPD (chronic obstructive pulmonary disease) (HCC)    Family History  Adopted: Yes   Past Surgical History:  Procedure Laterality Date  . ABDOMINAL AORTOGRAM N/A 08/17/2017   Procedure: ABDOMINAL AORTOGRAM;  Surgeon: Maeola Harman, MD;  Location: [facility] INVASIVE CV LAB;  Service: Cardiovascular;  Laterality: N/A;  . AMPUTATION Right 08/21/2017   Procedure: TRANSMETATARSAL AMPUTATION RIGHT FOOT;  Surgeon: Nadara Mustard, MD;  Location: [facility] OR;  Service: Orthopedics;  Laterality: Right;  . AMPUTATION TOE Right 08/18/2017   Procedure: AMPUTATION RIGHT SECOND AND THIRD TOES;  Surgeon: Maeola Harman, MD;  Location: [facility] OR;  Service: Vascular;  Laterality: Right;  . ENDARTERECTOMY FEMORAL Right 08/18/2017   Procedure: ENDARTERECTOMY RIGHT SUPERFICIAL Alfredia Ferguson;  Surgeon: Maeola Harman, MD;  Location: [facility] OR;  Service: Vascular;  Laterality: Right;  . FEMORAL-POPLITEAL BYPASS GRAFT Right 08/18/2017   Procedure: BYPASS GRAFT RIGHT COMMON FEMORAL TO ABOVE KNEE POPLITEAL ARTERY USING RIGHT REVERSED GREAT SAPHENOUS VEIN;  Surgeon: Maeola Harman, MD;  Location: [facility] OR;  Service: Vascular;  Laterality: Right;  . LOWER EXTREMITY ANGIOGRAPHY  Right 08/17/2017   Procedure: Lower Extremity Angiography;  Surgeon: Maeola Harman, MD;  Location: [facility] INVASIVE CV LAB;  Service: Cardiovascular;  Laterality: Right;  . PERIPHERAL VASCULAR BALLOON ANGIOPLASTY Right 08/17/2017   Procedure: PERIPHERAL VASCULAR BALLOON ANGIOPLASTY;  Surgeon: Maeola Harman, MD;  Location: [facility] INVASIVE CV LAB;  Service: Cardiovascular;  Laterality: Right;  SFA UNABLE TO CROSS  . VEIN HARVEST Right 08/18/2017   Procedure: VEIN HARVEST RIGHT GREAT SAPHENOUS;  Surgeon: Maeola Harman, MD;  Location: [facility] OR;  Service: Vascular;  Laterality: Right;  . WOUND DEBRIDEMENT Right 08/18/2017   Procedure: DEBRIDEMENT WOUND RIGHT FOOT;  Surgeon: Maeola Harman, MD;  Location: [facility] OR;  Service: Vascular;  Laterality: Right;    Short Social History:  Social History   Tobacco Use  . Smoking status: Former Games developer  . Smokeless tobacco: Never Used  Substance Use Topics  . Alcohol use: Not Currently    Allergies  Allergen Reactions  . Penicillins Hives and Rash    Childhood reaction Has patient had a PCN reaction causing immediate rash, facial/tongue/throat swelling, SOB or lightheadedness with hypotension: No PT DEVELOPED ## SEVERE ## RASH INVOLVING MUCUS MEMBRANES or SKIN NECROSIS: #  #  YES  #  # Has patient had a PCN reaction that required hospitalization: Unknown Has patient had a PCN reaction occurring within the last 10 years: No  . Clindamycin Rash    Diffuse rash across body    Current Outpatient Medications  Medication Sig Dispense Refill  . Albuterol Sulfate 108 (90 Base) MCG/ACT AEPB Inhale 90  mcg into the lungs.    Marland Kitchen. aspirin 325 MG tablet Take 325 mg by mouth daily. As a buffered tablet antiplatelet    . budesonide-formoterol (SYMBICORT) 160-4.5 MCG/ACT inhaler Inhale 2 puffs into the lungs 2 (two) times daily.    . enalapril (VASOTEC) 10 MG tablet Take 10 mg by mouth daily.    . hydrochlorothiazide (MICROZIDE) 12.5 MG  capsule Take 12.5 mg by mouth daily.    . Multiple Vitamins-Minerals (CENTRUM SILVER 50+MEN PO) Take by mouth.    . rosuvastatin (CRESTOR) 10 MG tablet Take 1 tablet (10 mg total) by mouth daily.    . temazepam (RESTORIL) 30 MG capsule Take 30 mg by mouth at bedtime as needed for sleep.     No current facility-administered medications for this visit.     Review of Systems  Constitutional:  Constitutional negative. HENT: HENT negative.  Eyes: Eyes negative.  Respiratory: Respiratory negative.  Cardiovascular: Cardiovascular negative.  GI: Gastrointestinal negative.  Musculoskeletal: Musculoskeletal negative.  Skin: Skin negative.  Neurological: Neurological negative. Hematologic: Hematologic/lymphatic negative.  Psychiatric: Psychiatric negative.        Objective:  Objective   Vitals:   01/26/19 0900  BP: (!) 116/55  Pulse: 64  Resp: 20  Temp: (!) 97.4 F (36.3 C)  SpO2: 100%  Weight: 248 lb 6.4 oz (112.7 kg)  Height: 6' (1.829 m)   Body mass index is 33.69 kg/m.  Physical Exam HENT:     Nose: Nose normal.     Mouth/Throat:     Mouth: Mucous membranes are moist.  Eyes:     Pupils: Pupils are equal, round, and reactive to light.  Neck:     Musculoskeletal: Normal range of motion and neck supple.  Cardiovascular:     Rate and Rhythm: Normal rate.     Pulses:          Popliteal pulses are 2+ on the right side.     Comments: Strong dorsalis pedis and posterior tibial signals right foot Pulmonary:     Effort: Pulmonary effort is normal.  Abdominal:     General: Abdomen is flat.     Palpations: Abdomen is soft.  Musculoskeletal:     Right lower leg: Edema present.     Left lower leg: No edema.     Comments: Well-healed right transmetatarsal amputation  Skin:    General: Skin is warm.     Capillary Refill: Capillary refill takes less than 2 seconds.  Neurological:     General: No focal deficit present.     Mental Status: He is alert. Mental status is at  baseline.  Psychiatric:        Mood and Affect: Mood normal.        Thought Content: Thought content normal.     Data: I have independently interpreted his abdominal aortic study which demonstrates greatest aneurysm size 2.36 cm.  His SMA has velocity 354 cm/s and celiac artery 520 cm/s.  I have independently interpreted his right lower extremity duplex which demonstrates 75 to 99% stenosis in the inflow artery with velocity of 110.  Lowest velocities throughout the graft is 55 cm/s and monophasic     Assessment/Plan:     73 year old male status post right lower extremity bypass and has healed his transmetatarsal amputation.  There is persistent stenosis at the proximal anastomosis but distal velocities are preserved he has very strong signals.  Given that he has healed his wound has quit smoking is on aspirin Crestor we  will continue to monitor him with right lower extremity plaques and ABIs.  No need for further abdominal aortic evaluation and although he has high velocities in his SMA and celiac he has no symptoms from this so would not recheck this unless symptoms presented.     Waynetta Sandy MD [facility]

## 2019-06-22 ENCOUNTER — Other Ambulatory Visit: Payer: Self-pay

## 2019-06-22 ENCOUNTER — Ambulatory Visit: Payer: Medicare HMO | Attending: Internal Medicine

## 2019-06-22 DIAGNOSIS — Z23 Encounter for immunization: Secondary | ICD-10-CM

## 2019-06-22 NOTE — Progress Notes (Signed)
   Covid-19 Vaccination Clinic  Name:  Jorge Clements    MRN: [id number] DOB: [birth year]  06/22/2019  Jorge Clements was observed post Covid-19 immunization for 15 minutes without incident. He was provided with Vaccine Information Sheet and instruction to access the V-Safe system.   Jorge Clements was instructed to call 911 with any severe reactions post vaccine: Marland Kitchen Difficulty breathing  . Swelling of face and throat  . A fast heartbeat  . A bad rash all over body  . Dizziness and weakness   Immunizations Administered    Name Date Dose VIS Date Route   Moderna COVID-19 Vaccine 06/22/2019  8:45 AM 0.5 mL 03/20/2019 Intramuscular   Manufacturer: Moderna   Lot: [id number]   NDC: 44967-591-63

## 2019-06-29 DIAGNOSIS — R69 Illness, unspecified: Secondary | ICD-10-CM | POA: Diagnosis not present

## 2019-06-29 DIAGNOSIS — J449 Chronic obstructive pulmonary disease, unspecified: Secondary | ICD-10-CM | POA: Diagnosis not present

## 2019-06-29 DIAGNOSIS — Z6835 Body mass index (BMI) 35.0-35.9, adult: Secondary | ICD-10-CM | POA: Diagnosis not present

## 2019-06-29 DIAGNOSIS — Z712 Person consulting for explanation of examination or test findings: Secondary | ICD-10-CM | POA: Diagnosis not present

## 2019-06-29 DIAGNOSIS — R7303 Prediabetes: Secondary | ICD-10-CM | POA: Diagnosis not present

## 2019-06-29 DIAGNOSIS — R7301 Impaired fasting glucose: Secondary | ICD-10-CM | POA: Diagnosis not present

## 2019-06-29 DIAGNOSIS — L89892 Pressure ulcer of other site, stage 2: Secondary | ICD-10-CM | POA: Diagnosis not present

## 2019-06-29 DIAGNOSIS — Z0000 Encounter for general adult medical examination without abnormal findings: Secondary | ICD-10-CM | POA: Diagnosis not present

## 2019-06-29 DIAGNOSIS — I10 Essential (primary) hypertension: Secondary | ICD-10-CM | POA: Diagnosis not present

## 2019-06-29 DIAGNOSIS — L97319 Non-pressure chronic ulcer of right ankle with unspecified severity: Secondary | ICD-10-CM | POA: Diagnosis not present

## 2019-06-29 DIAGNOSIS — I739 Peripheral vascular disease, unspecified: Secondary | ICD-10-CM | POA: Diagnosis not present

## 2019-07-04 DIAGNOSIS — J449 Chronic obstructive pulmonary disease, unspecified: Secondary | ICD-10-CM | POA: Diagnosis not present

## 2019-07-04 DIAGNOSIS — E782 Mixed hyperlipidemia: Secondary | ICD-10-CM | POA: Diagnosis not present

## 2019-07-04 DIAGNOSIS — Z89421 Acquired absence of other right toe(s): Secondary | ICD-10-CM | POA: Diagnosis not present

## 2019-07-04 DIAGNOSIS — L97319 Non-pressure chronic ulcer of right ankle with unspecified severity: Secondary | ICD-10-CM | POA: Diagnosis not present

## 2019-07-04 DIAGNOSIS — I10 Essential (primary) hypertension: Secondary | ICD-10-CM | POA: Diagnosis not present

## 2019-07-04 DIAGNOSIS — R7301 Impaired fasting glucose: Secondary | ICD-10-CM | POA: Diagnosis not present

## 2019-07-04 DIAGNOSIS — I7025 Atherosclerosis of native arteries of other extremities with ulceration: Secondary | ICD-10-CM | POA: Diagnosis not present

## 2019-07-04 DIAGNOSIS — D696 Thrombocytopenia, unspecified: Secondary | ICD-10-CM | POA: Diagnosis not present

## 2019-07-04 DIAGNOSIS — I739 Peripheral vascular disease, unspecified: Secondary | ICD-10-CM | POA: Diagnosis not present

## 2019-07-04 DIAGNOSIS — R7303 Prediabetes: Secondary | ICD-10-CM | POA: Diagnosis not present

## 2019-07-24 ENCOUNTER — Ambulatory Visit: Payer: Medicare HMO | Attending: Internal Medicine

## 2019-07-24 DIAGNOSIS — Z23 Encounter for immunization: Secondary | ICD-10-CM

## 2019-07-24 NOTE — Progress Notes (Signed)
   Covid-19 Vaccination Clinic  Name:  Rajesh Wyss    MRN: [id number] DOB: 08-03-1945  07/24/2019  Mr. Hagan was observed post Covid-19 immunization for 15 minutes without incident. He was provided with Vaccine Information Sheet and instruction to access the V-Safe system.   Mr. Schroepfer was instructed to call 911 with any severe reactions post vaccine: Marland Kitchen Difficulty breathing  . Swelling of face and throat  . A fast heartbeat  . A bad rash all over body  . Dizziness and weakness   Immunizations Administered    Name Date Dose VIS Date Route   Moderna COVID-19 Vaccine 07/24/2019  9:40 AM 0.5 mL 03/20/2019 Intramuscular   Manufacturer: Moderna   Lot: [id number]   NDC: 83437-357-89

## 2019-07-26 ENCOUNTER — Telehealth (HOSPITAL_COMMUNITY): Payer: Self-pay

## 2019-07-26 ENCOUNTER — Other Ambulatory Visit: Payer: Self-pay | Admitting: *Deleted

## 2019-07-26 DIAGNOSIS — I779 Disorder of arteries and arterioles, unspecified: Secondary | ICD-10-CM

## 2019-07-26 DIAGNOSIS — I739 Peripheral vascular disease, unspecified: Secondary | ICD-10-CM

## 2019-07-26 NOTE — Telephone Encounter (Signed)

## 2019-07-30 ENCOUNTER — Other Ambulatory Visit: Payer: Self-pay

## 2019-07-30 ENCOUNTER — Ambulatory Visit: Payer: Medicare HMO | Admitting: Physician Assistant

## 2019-07-30 ENCOUNTER — Ambulatory Visit (INDEPENDENT_AMBULATORY_CARE_PROVIDER_SITE_OTHER)
Admission: RE | Admit: 2019-07-30 | Discharge: 2019-07-30 | Disposition: A | Discharge status: Home / Self Care | Payer: Medicare HMO | Source: Ambulatory Visit | Attending: Surgery | Admitting: Surgery

## 2019-07-30 ENCOUNTER — Ambulatory Visit (HOSPITAL_COMMUNITY)
Admission: RE | Admit: 2019-07-30 | Discharge: 2019-07-30 | Disposition: A | Discharge status: Home / Self Care | Payer: Medicare HMO | Source: Ambulatory Visit | Attending: Surgery | Admitting: Surgery

## 2019-07-30 VITALS — BP 143/52 | HR 69 | Temp 97.3°F | Ht 72.0 in | Wt 265.0 lb

## 2019-07-30 DIAGNOSIS — I779 Disorder of arteries and arterioles, unspecified: Secondary | ICD-10-CM | POA: Diagnosis not present

## 2019-07-30 DIAGNOSIS — I739 Peripheral vascular disease, unspecified: Secondary | ICD-10-CM

## 2019-07-30 DIAGNOSIS — Z89431 Acquired absence of right foot: Secondary | ICD-10-CM | POA: Diagnosis not present

## 2019-07-30 NOTE — Progress Notes (Signed)
Office Note     CC:  follow up Requesting Provider:  Celene Squibb, MD  HPI: Jorge Clements is a 74 y.o. (03-20-1946) male who presents for follow up of peripheral vascular disease. He has undergone a previous right femoral to above-knee bypass with vein and also underwent right transmetatarsal amputation.  Postop was complicated by extreme swelling. His swelling was mostly resolved at time of last visit in October of 2020 and he had no new wounds.   Today he denies any claudication symptoms or rest pain. He does state that he occasionally has some restlessness that bothers him at night. He also occasionally has swelling in right greater than left leg. He wears a compression stocking on his right leg that helps with the swelling. He states that he had gotten sick about 1 month ago and for 3 weeks he stopped taking care of his right TMA stump. He said he didn't take his sock off the whole time. He didn't wash or moisturize his right foot so now he has some dry skin and some weeping. He additionally tried to cut his own toe nails on the left and accidentally clipped the skin off on the 2nd toe  He denies any abdominal pain or back pain. He denies any nausea, vomiting, diarrhea, or difficulty eating He does take aspirin and Crestor daily.  He is a previous heavy smoker and drinker but quit.  The pt is on a statin for cholesterol management.  The pt is on a daily aspirin.   Other AC:  none The pt is on ACEI and HCTZ for hypertension.   The pt is not diabetic.  Tobacco hx:  Former smoker  Past Medical History:  Diagnosis Date  . AAA (abdominal aortic aneurysm) ([place])   . COPD (chronic obstructive pulmonary disease) ([place])     Past Surgical History:  Procedure Laterality Date  . ABDOMINAL AORTOGRAM N/A 08/17/2017   Procedure: ABDOMINAL AORTOGRAM;  Surgeon: Waynetta Sandy, MD;  Location: [place] CV LAB;  Service: Cardiovascular;  Laterality: N/A;  . AMPUTATION Right 08/21/2017   Procedure: TRANSMETATARSAL AMPUTATION RIGHT FOOT;  Surgeon: Newt Minion, MD;  Location: [place];  Service: Orthopedics;  Laterality: Right;  . AMPUTATION TOE Right 08/18/2017   Procedure: AMPUTATION RIGHT SECOND AND THIRD TOES;  Surgeon: Waynetta Sandy, MD;  Location: [place];  Service: Vascular;  Laterality: Right;  . ENDARTERECTOMY FEMORAL Right 08/18/2017   Procedure: ENDARTERECTOMY RIGHT SUPERFICIAL Jerene Canny;  Surgeon: Waynetta Sandy, MD;  Location: [place];  Service: Vascular;  Laterality: Right;  . FEMORAL-POPLITEAL BYPASS GRAFT Right 08/18/2017   Procedure: BYPASS GRAFT RIGHT COMMON FEMORAL TO ABOVE KNEE POPLITEAL ARTERY USING RIGHT REVERSED GREAT SAPHENOUS VEIN;  Surgeon: Waynetta Sandy, MD;  Location: [place];  Service: Vascular;  Laterality: Right;  . LOWER EXTREMITY ANGIOGRAPHY Right 08/17/2017   Procedure: Lower Extremity Angiography;  Surgeon: Waynetta Sandy, MD;  Location: [place] CV LAB;  Service: Cardiovascular;  Laterality: Right;  . PERIPHERAL VASCULAR BALLOON ANGIOPLASTY Right 08/17/2017   Procedure: PERIPHERAL VASCULAR BALLOON ANGIOPLASTY;  Surgeon: Waynetta Sandy, MD;  Location: [place] CV LAB;  Service: Cardiovascular;  Laterality: Right;  SFA UNABLE TO CROSS  . VEIN HARVEST Right 08/18/2017   Procedure: VEIN HARVEST RIGHT GREAT SAPHENOUS;  Surgeon: Waynetta Sandy, MD;  Location: [place];  Service: Vascular;  Laterality: Right;  . WOUND DEBRIDEMENT Right 08/18/2017   Procedure: DEBRIDEMENT WOUND RIGHT FOOT;  Surgeon: Waynetta Sandy, MD;  Location: [facility] OR;  Service: Vascular;  Laterality: Right;    Social History   Socioeconomic History  . Marital status: Divorced    Spouse name: Not on file  . Number of children: Not on file  . Years of education: Not on file  . Highest education level: Not on file  Occupational History  . Not on file  Tobacco Use  . Smoking status: Former Games developer  . Smokeless  tobacco: Never Used  Substance and Sexual Activity  . Alcohol use: Not Currently  . Drug use: No  . Sexual activity: Not on file  Other Topics Concern  . Not on file  Social History Narrative  . Not on file   Social Determinants of Health   Financial Resource Strain:   . Difficulty of Paying Living Expenses:   Food Insecurity:   . Worried About Programme researcher, broadcasting/film/video in the Last Year:   . Barista in the Last Year:   Transportation Needs:   . Freight forwarder (Medical):   Marland Kitchen Lack of Transportation (Non-Medical):   Physical Activity:   . Days of Exercise per Week:   . Minutes of Exercise per Session:   Stress:   . Feeling of Stress :   Social Connections:   . Frequency of Communication with Friends and Family:   . Frequency of Social Gatherings with Friends and Family:   . Attends Religious Services:   . Active Member of Clubs or Organizations:   . Attends Banker Meetings:   Marland Kitchen Marital Status:   Intimate Partner Violence:   . Fear of Current or Ex-Partner:   . Emotionally Abused:   Marland Kitchen Physically Abused:   . Sexually Abused:     Family History  Adopted: Yes    Current Outpatient Medications  Medication Sig Dispense Refill  . Albuterol Sulfate 108 (90 Base) MCG/ACT AEPB Inhale 90 mcg into the lungs.    Marland Kitchen aspirin 325 MG tablet Take 325 mg by mouth daily. As a buffered tablet antiplatelet    . BEVESPI AEROSPHERE 9-4.8 MCG/ACT AERO SMARTSIG:2 Puff(s) By Mouth Twice Daily    . BREZTRI AEROSPHERE 160-9-4.8 MCG/ACT AERO Inhale 2 puffs into the lungs 2 (two) times daily.    . enalapril (VASOTEC) 10 MG tablet Take 10 mg by mouth daily.    . hydrochlorothiazide (MICROZIDE) 12.5 MG capsule Take 12.5 mg by mouth daily.    Marland Kitchen LORazepam (ATIVAN) 1 MG tablet Take 1 mg by mouth daily as needed.    . Multiple Vitamins-Minerals (CENTRUM SILVER 50+MEN PO) Take by mouth.    . rosuvastatin (CRESTOR) 10 MG tablet Take 1 tablet (10 mg total) by mouth daily.     No  current facility-administered medications for this visit.    Allergies  Allergen Reactions  . Penicillins Hives and Rash    Childhood reaction Has patient had a PCN reaction causing immediate rash, facial/tongue/throat swelling, SOB or lightheadedness with hypotension: No PT DEVELOPED ## SEVERE ## RASH INVOLVING MUCUS MEMBRANES or SKIN NECROSIS: #  #  YES  #  # Has patient had a PCN reaction that required hospitalization: Unknown Has patient had a PCN reaction occurring within the last 10 years: No  . Clindamycin Rash    Diffuse rash across body    REVIEW OF SYSTEMS:  Review of Systems  Constitutional: Negative for chills, fever and malaise/fatigue.  HENT: Negative for sore throat.   Respiratory: Positive for cough and shortness of breath.  Cardiovascular: Negative for chest pain and palpitations.  Gastrointestinal: Negative for abdominal pain, constipation, diarrhea, heartburn, nausea and vomiting.  Genitourinary: Negative for dysuria.  Musculoskeletal: Negative for myalgias.  Neurological: Negative for dizziness and focal weakness.    PHYSICAL EXAMINATION:  Vitals:   07/30/19 1518  BP: (!) 143/52  Pulse: 69  Temp: (!) 97.3 F (36.3 C)  TempSrc: Temporal  SpO2: 96%  Weight: 265 lb (120.2 kg)  Height: 6' (1.829 m)    General:  WDWN in NAD; vital signs documented above Gait:  normal HENT: WNL, normocephalic Pulmonary: normal non-labored breathing , without Rales, rhonchi,  wheezing Cardiac: regular HR, without  Murmurs without carotid bruit Abdomen: soft, NT, no masses Skin: without rashes Vascular Exam/Pulses: 2+ femoral pulses bilaterally, right DP and PT palpable, left PT palpable. Both lower extremities warm.       Extremities: without ischemic changes, without Gangrene , without cellulitis; without open wounds; eschars and scaly skin changes to anterior flap/ dorsum and medial right ankle as shown above. No drainage. Chronic venous stasis of right lower  extremity Musculoskeletal: no muscle wasting or atrophy  Neurologic: A&O X 3;  No focal weakness or paresthesias are detected Psychiatric:  The pt has Normal affect.   Non-Invasive Vascular Imaging:   07/30/19 VAS [place] lower Extremity Arterial Duplex A focal velocity elevation of 449 cm/s was obtained at Right Common  femoral artery with a VR of 2.5. Findings are characteristic of 50-74%  stenosis.   Right Graft #1: Fem- (AK) pop  +------------------+--------+---------------+----------+--------+           PSV cm/sStenosis    Waveform Comments  +------------------+--------+---------------+----------+--------+  Inflow      449   50-74% stenosistriphasic       +------------------+--------+---------------+----------+--------+  Prox Anastomosis 198           triphasic       +------------------+--------+---------------+----------+--------+  Proximal Graft  66           monophasic      +------------------+--------+---------------+----------+--------+  Mid Graft     55           monophasic      +------------------+--------+---------------+----------+--------+  Distal Graft   42           monophasic      +------------------+--------+---------------+----------+--------+  Distal Anastomosis71           monophasic      +------------------+--------+---------------+----------+--------+  Outflow      101           monophasic      +------------------+--------+---------------+----------+--------+   Patent right fem-pop bypass graft with 50-74% stenosis in the proximal  native CFA.   Summary:  Right: 50-74% stenosis noted in the common femoral artery. No significant  change compared to prior study.    +-------+-----------+-----------+------------+----------------+  ABI/TBIToday's ABIToday's TBIPrevious ABI Previous TBI    +-------+-----------+-----------+------------+----------------+  Right 0.76    NA     0.82    NA         +-------+-----------+-----------+------------+----------------+  Left  0.59    0.17    0.46    Unable to obtain  +-------+-----------+-----------+------------+----------------+    ASSESSMENT/PLAN:: 74 y.o. male here for follow up for previous right femoral to above-knee bypass with vein and also underwent right transmetatarsal amputation.  New skin changes to right TMA stump appear superficial and more venous then arterial. I feel that these will likely heal but advised patient to follow up sooner if he has concerns about wound infection such as pain,  redness, warmth, or drainage. Otherwise his non invasive vascular studies are essentially unchanged from his prior studies. He still has 50-74% stenosis in his right CFA. Minimal change in ABI/TBI. He does not have rest pain. He will continued his Aspirin and Statin. He will call if he has any issues otherwise he will follow up in 6 months with repeat ABI and arterial duplex   Graceann Congress, PA-C [facility] [phone]  Clinic MD: Dr. Myra Gianotti

## 2019-07-31 ENCOUNTER — Other Ambulatory Visit: Payer: Self-pay | Admitting: *Deleted

## 2019-07-31 DIAGNOSIS — Z9889 Other specified postprocedural states: Secondary | ICD-10-CM

## 2019-07-31 DIAGNOSIS — I739 Peripheral vascular disease, unspecified: Secondary | ICD-10-CM

## 2019-11-30 DIAGNOSIS — Z20822 Contact with and (suspected) exposure to covid-19: Secondary | ICD-10-CM | POA: Diagnosis present

## 2019-11-30 DIAGNOSIS — I714 Abdominal aortic aneurysm, without rupture: Secondary | ICD-10-CM | POA: Diagnosis present

## 2019-11-30 DIAGNOSIS — Z79899 Other long term (current) drug therapy: Secondary | ICD-10-CM

## 2019-11-30 DIAGNOSIS — I2510 Atherosclerotic heart disease of native coronary artery without angina pectoris: Secondary | ICD-10-CM | POA: Diagnosis present

## 2019-11-30 DIAGNOSIS — Z87891 Personal history of nicotine dependence: Secondary | ICD-10-CM

## 2019-11-30 DIAGNOSIS — R0602 Shortness of breath: Secondary | ICD-10-CM | POA: Diagnosis not present

## 2019-11-30 DIAGNOSIS — L859 Epidermal thickening, unspecified: Secondary | ICD-10-CM | POA: Diagnosis present

## 2019-11-30 DIAGNOSIS — J449 Chronic obstructive pulmonary disease, unspecified: Secondary | ICD-10-CM | POA: Diagnosis present

## 2019-11-30 DIAGNOSIS — I249 Acute ischemic heart disease, unspecified: Secondary | ICD-10-CM | POA: Diagnosis not present

## 2019-11-30 DIAGNOSIS — E1151 Type 2 diabetes mellitus with diabetic peripheral angiopathy without gangrene: Secondary | ICD-10-CM | POA: Diagnosis present

## 2019-11-30 DIAGNOSIS — Z7982 Long term (current) use of aspirin: Secondary | ICD-10-CM

## 2019-11-30 DIAGNOSIS — I214 Non-ST elevation (NSTEMI) myocardial infarction: Secondary | ICD-10-CM | POA: Diagnosis not present

## 2019-11-30 DIAGNOSIS — Z89431 Acquired absence of right foot: Secondary | ICD-10-CM

## 2019-11-30 DIAGNOSIS — Z880 Allergy status to penicillin: Secondary | ICD-10-CM

## 2019-11-30 DIAGNOSIS — Z881 Allergy status to other antibiotic agents status: Secondary | ICD-10-CM

## 2019-11-30 DIAGNOSIS — I10 Essential (primary) hypertension: Secondary | ICD-10-CM | POA: Diagnosis present

## 2019-11-30 DIAGNOSIS — E785 Hyperlipidemia, unspecified: Secondary | ICD-10-CM | POA: Diagnosis present

## 2019-11-30 DIAGNOSIS — R0789 Other chest pain: Secondary | ICD-10-CM | POA: Diagnosis not present

## 2019-12-01 ENCOUNTER — Inpatient Hospital Stay (HOSPITAL_COMMUNITY)
Admission: EM | Admit: 2019-12-01 | Discharge: 2019-12-04 | DRG: 247 | Disposition: A | Discharge status: Home / Self Care | Payer: Medicare HMO | Attending: Cardiovascular Disease | Admitting: Cardiovascular Disease

## 2019-12-01 ENCOUNTER — Encounter (HOSPITAL_COMMUNITY): Payer: Self-pay | Admitting: Emergency Medicine

## 2019-12-01 ENCOUNTER — Other Ambulatory Visit: Payer: Self-pay

## 2019-12-01 ENCOUNTER — Emergency Department (HOSPITAL_COMMUNITY): Payer: Medicare HMO

## 2019-12-01 DIAGNOSIS — I249 Acute ischemic heart disease, unspecified: Secondary | ICD-10-CM

## 2019-12-01 DIAGNOSIS — Z89431 Acquired absence of right foot: Secondary | ICD-10-CM | POA: Diagnosis not present

## 2019-12-01 DIAGNOSIS — I714 Abdominal aortic aneurysm, without rupture: Secondary | ICD-10-CM | POA: Diagnosis not present

## 2019-12-01 DIAGNOSIS — I10 Essential (primary) hypertension: Secondary | ICD-10-CM | POA: Diagnosis not present

## 2019-12-01 DIAGNOSIS — Z7982 Long term (current) use of aspirin: Secondary | ICD-10-CM | POA: Diagnosis not present

## 2019-12-01 DIAGNOSIS — I2510 Atherosclerotic heart disease of native coronary artery without angina pectoris: Secondary | ICD-10-CM | POA: Diagnosis not present

## 2019-12-01 DIAGNOSIS — Z20822 Contact with and (suspected) exposure to covid-19: Secondary | ICD-10-CM | POA: Diagnosis not present

## 2019-12-01 DIAGNOSIS — Z880 Allergy status to penicillin: Secondary | ICD-10-CM | POA: Diagnosis not present

## 2019-12-01 DIAGNOSIS — I214 Non-ST elevation (NSTEMI) myocardial infarction: Secondary | ICD-10-CM | POA: Diagnosis not present

## 2019-12-01 DIAGNOSIS — E1151 Type 2 diabetes mellitus with diabetic peripheral angiopathy without gangrene: Secondary | ICD-10-CM | POA: Diagnosis not present

## 2019-12-01 DIAGNOSIS — Z881 Allergy status to other antibiotic agents status: Secondary | ICD-10-CM | POA: Diagnosis not present

## 2019-12-01 DIAGNOSIS — J449 Chronic obstructive pulmonary disease, unspecified: Secondary | ICD-10-CM | POA: Diagnosis not present

## 2019-12-01 DIAGNOSIS — E785 Hyperlipidemia, unspecified: Secondary | ICD-10-CM | POA: Diagnosis not present

## 2019-12-01 DIAGNOSIS — J439 Emphysema, unspecified: Secondary | ICD-10-CM | POA: Diagnosis not present

## 2019-12-01 DIAGNOSIS — Z79899 Other long term (current) drug therapy: Secondary | ICD-10-CM | POA: Diagnosis not present

## 2019-12-01 DIAGNOSIS — Z955 Presence of coronary angioplasty implant and graft: Secondary | ICD-10-CM

## 2019-12-01 DIAGNOSIS — R079 Chest pain, unspecified: Secondary | ICD-10-CM | POA: Diagnosis not present

## 2019-12-01 DIAGNOSIS — Z87891 Personal history of nicotine dependence: Secondary | ICD-10-CM | POA: Diagnosis not present

## 2019-12-01 DIAGNOSIS — L859 Epidermal thickening, unspecified: Secondary | ICD-10-CM | POA: Diagnosis not present

## 2019-12-01 HISTORY — DX: Essential (primary) hypertension: I10

## 2019-12-01 HISTORY — DX: Peripheral vascular disease, unspecified: I73.9

## 2019-12-01 HISTORY — DX: Hyperlipidemia, unspecified: E78.5

## 2019-12-01 LAB — CBC WITH DIFFERENTIAL/PLATELET
Abs Immature Granulocytes: 0.08 10*3/uL — ABNORMAL HIGH (ref 0.00–0.07)
Basophils Absolute: 0.1 10*3/uL (ref 0.0–0.1)
Basophils Relative: 1 %
Eosinophils Absolute: 0.1 10*3/uL (ref 0.0–0.5)
Eosinophils Relative: 2 %
HCT: 40 % (ref 39.0–52.0)
Hemoglobin: 13.1 g/dL (ref 13.0–17.0)
Immature Granulocytes: 1 %
Lymphocytes Relative: 14 %
Lymphs Abs: 1.2 10*3/uL (ref 0.7–4.0)
MCH: 29.6 pg (ref 26.0–34.0)
MCHC: 32.8 g/dL (ref 30.0–36.0)
MCV: 90.5 fL (ref 80.0–100.0)
Monocytes Absolute: 0.8 10*3/uL (ref 0.1–1.0)
Monocytes Relative: 9 %
Neutro Abs: 6.8 10*3/uL (ref 1.7–7.7)
Neutrophils Relative %: 73 %
Platelets: 121 10*3/uL — ABNORMAL LOW (ref 150–400)
RBC: 4.42 MIL/uL (ref 4.22–5.81)
RDW: 14.4 % (ref 11.5–15.5)
WBC: 9.1 10*3/uL (ref 4.0–10.5)
nRBC: 0 % (ref 0.0–0.2)

## 2019-12-01 LAB — COMPREHENSIVE METABOLIC PANEL
ALT: 22 U/L (ref 0–44)
AST: 20 U/L (ref 15–41)
Albumin: 3.9 g/dL (ref 3.5–5.0)
Alkaline Phosphatase: 75 U/L (ref 38–126)
Anion gap: 11 (ref 5–15)
BUN: 25 mg/dL — ABNORMAL HIGH (ref 8–23)
CO2: 25 mmol/L (ref 22–32)
Calcium: 9.4 mg/dL (ref 8.9–10.3)
Chloride: 98 mmol/L (ref 98–111)
Creatinine, Ser: 1.18 mg/dL (ref 0.61–1.24)
GFR calc Af Amer: >60 mL/min (ref >60)
GFR calc non Af Amer: >60 mL/min (ref >60)
Glucose, Bld: 181 mg/dL — ABNORMAL HIGH (ref 70–99)
Potassium: 3.9 mmol/L (ref 3.5–5.1)
Sodium: 134 mmol/L — ABNORMAL LOW (ref 135–145)
Total Bilirubin: 0.7 mg/dL (ref 0.3–1.2)
Total Protein: 7 g/dL (ref 6.5–8.1)

## 2019-12-01 LAB — HEPARIN LEVEL (UNFRACTIONATED)
Heparin Unfractionated: 0.22 IU/mL — ABNORMAL LOW (ref 0.30–0.70)
Heparin Unfractionated: 0.37 IU/mL (ref 0.30–0.70)

## 2019-12-01 LAB — BRAIN NATRIURETIC PEPTIDE: B Natriuretic Peptide: 287.1 pg/mL — ABNORMAL HIGH (ref 0.0–100.0)

## 2019-12-01 LAB — SARS CORONAVIRUS 2 BY RT PCR (HOSPITAL ORDER, PERFORMED IN ~~LOC~~ HOSPITAL LAB): SARS Coronavirus 2: NEGATIVE

## 2019-12-01 LAB — LIPASE, BLOOD: Lipase: 26 U/L (ref 11–51)

## 2019-12-01 LAB — TROPONIN I (HIGH SENSITIVITY)
Troponin I (High Sensitivity): 21 ng/L — ABNORMAL HIGH (ref <18)
Troponin I (High Sensitivity): 147 ng/L (ref <18)
Troponin I (High Sensitivity): 188 ng/L (ref <18)
Troponin I (High Sensitivity): 162 ng/L (ref <18)

## 2019-12-01 MED ORDER — ROSUVASTATIN CALCIUM 20 MG PO TABS
40.0000 mg | ORAL_TABLET | Freq: Every day | ORAL | Status: DC
  Administered 2019-12-02 – 2019-12-04 (×3): 40 mg via ORAL
  Filled 2019-12-01 (×3): qty 2

## 2019-12-01 MED ORDER — NITROGLYCERIN 0.4 MG SL SUBL
0.4000 mg | SUBLINGUAL_TABLET | Freq: Once | SUBLINGUAL | Status: AC
  Administered 2019-12-01: 0.4 mg via SUBLINGUAL
  Filled 2019-12-01: qty 1

## 2019-12-01 MED ORDER — HEPARIN BOLUS VIA INFUSION
4000.0000 [IU] | Freq: Once | INTRAVENOUS | Status: AC
  Administered 2019-12-01: 4000 [IU] via INTRAVENOUS

## 2019-12-01 MED ORDER — NITROGLYCERIN 0.4 MG SL SUBL: 0.4000 mg | SUBLINGUAL_TABLET | SUBLINGUAL | Status: DC | PRN

## 2019-12-01 MED ORDER — ACETAMINOPHEN 325 MG PO TABS: 650.0000 mg | ORAL_TABLET | ORAL | Status: DC | PRN

## 2019-12-01 MED ORDER — HYDROCHLOROTHIAZIDE 25 MG PO TABS: 12.5000 mg | ORAL_TABLET | Freq: Every morning | ORAL | Status: DC

## 2019-12-01 MED ORDER — ENALAPRIL MALEATE 5 MG PO TABS
10.0000 mg | ORAL_TABLET | Freq: Every day | ORAL | Status: DC
  Administered 2019-12-02: 10 mg via ORAL
  Filled 2019-12-01 (×2): qty 2

## 2019-12-01 MED ORDER — ASPIRIN 81 MG PO CHEW
81.0000 mg | CHEWABLE_TABLET | Freq: Every day | ORAL | Status: DC
  Administered 2019-12-02 – 2019-12-04 (×3): 81 mg via ORAL
  Filled 2019-12-01 (×3): qty 1

## 2019-12-01 MED ORDER — BUDESON-GLYCOPYRROL-FORMOTEROL 160-9-4.8 MCG/ACT IN AERO: 2.0000 | INHALATION_SPRAY | Freq: Two times a day (BID) | RESPIRATORY_TRACT | Status: DC

## 2019-12-01 MED ORDER — SODIUM CHLORIDE 0.9 % WEIGHT BASED INFUSION
3.0000 mL/kg/h | INTRAVENOUS | Status: DC
  Administered 2019-12-03: 3 mL/kg/h via INTRAVENOUS

## 2019-12-01 MED ORDER — MOMETASONE FURO-FORMOTEROL FUM 200-5 MCG/ACT IN AERO
2.0000 | INHALATION_SPRAY | Freq: Two times a day (BID) | RESPIRATORY_TRACT | Status: DC
  Administered 2019-12-01 – 2019-12-04 (×6): 2 via RESPIRATORY_TRACT
  Filled 2019-12-01 (×3): qty 8.8

## 2019-12-01 MED ORDER — METOPROLOL TARTRATE 25 MG PO TABS
25.0000 mg | ORAL_TABLET | Freq: Two times a day (BID) | ORAL | Status: DC
  Administered 2019-12-01 – 2019-12-04 (×6): 25 mg via ORAL
  Filled 2019-12-01 (×6): qty 1

## 2019-12-01 MED ORDER — SODIUM CHLORIDE 0.9 % WEIGHT BASED INFUSION
1.0000 mL/kg/h | INTRAVENOUS | Status: DC
  Administered 2019-12-03: 1 mL/kg/h via INTRAVENOUS

## 2019-12-01 MED ORDER — NITROGLYCERIN IN D5W 200-5 MCG/ML-% IV SOLN
5.0000 ug/min | INTRAVENOUS | Status: DC
  Administered 2019-12-01: 5 ug/min via INTRAVENOUS
  Filled 2019-12-01: qty 250

## 2019-12-01 MED ORDER — TRAZODONE HCL 50 MG PO TABS
50.0000 mg | ORAL_TABLET | Freq: Every evening | ORAL | Status: DC | PRN
  Administered 2019-12-02 (×2): 50 mg via ORAL
  Filled 2019-12-01 (×2): qty 1

## 2019-12-01 MED ORDER — HEPARIN (PORCINE) 25000 UT/250ML-% IV SOLN
1600.0000 [IU]/h | INTRAVENOUS | Status: DC
  Administered 2019-12-01: 1600 [IU]/h via INTRAVENOUS
  Administered 2019-12-01: 1400 [IU]/h via INTRAVENOUS
  Administered 2019-12-02: 1600 [IU]/h via INTRAVENOUS
  Filled 2019-12-01 (×3): qty 250

## 2019-12-01 MED ORDER — ALBUTEROL SULFATE (2.5 MG/3ML) 0.083% IN NEBU: 2.5000 mg | INHALATION_SOLUTION | Freq: Four times a day (QID) | RESPIRATORY_TRACT | Status: DC | PRN

## 2019-12-01 MED ORDER — ASPIRIN 325 MG PO TABS
325.0000 mg | ORAL_TABLET | Freq: Every day | ORAL | Status: DC
  Administered 2019-12-01: 325 mg via ORAL
  Filled 2019-12-01: qty 1

## 2019-12-01 MED ORDER — ROSUVASTATIN CALCIUM 5 MG PO TABS
10.0000 mg | ORAL_TABLET | Freq: Every day | ORAL | Status: DC
  Administered 2019-12-01: 10 mg via ORAL
  Filled 2019-12-01 (×3): qty 1

## 2019-12-01 MED ORDER — ROSUVASTATIN CALCIUM 20 MG PO TABS: 40.0000 mg | ORAL_TABLET | Freq: Every day | ORAL | Status: DC

## 2019-12-01 MED ORDER — HYDROCHLOROTHIAZIDE 12.5 MG PO CAPS
12.5000 mg | ORAL_CAPSULE | Freq: Every day | ORAL | Status: DC
  Administered 2019-12-01 – 2019-12-02 (×2): 12.5 mg via ORAL
  Filled 2019-12-01 (×2): qty 1

## 2019-12-01 MED ORDER — ONDANSETRON HCL 4 MG/2ML IJ SOLN: 4.0000 mg | Freq: Four times a day (QID) | INTRAMUSCULAR | Status: DC | PRN

## 2019-12-01 MED ORDER — ALBUTEROL SULFATE 108 (90 BASE) MCG/ACT IN AEPB: 2.0000 | INHALATION_SPRAY | Freq: Four times a day (QID) | RESPIRATORY_TRACT | Status: DC | PRN

## 2019-12-01 MED ORDER — ASPIRIN 81 MG PO CHEW
324.0000 mg | CHEWABLE_TABLET | Freq: Once | ORAL | Status: AC
  Administered 2019-12-01: 324 mg via ORAL
  Filled 2019-12-01: qty 4

## 2019-12-01 NOTE — Progress Notes (Signed)
ANTICOAGULATION CONSULT NOTE - Initial Consult  Pharmacy Consult for Heparin Indication: chest pain/ACS  Allergies  Allergen Reactions  . Penicillins Hives and Rash    Childhood reaction Has patient had a PCN reaction causing immediate rash, facial/tongue/throat swelling, SOB or lightheadedness with hypotension: No PT DEVELOPED ## SEVERE ## RASH INVOLVING MUCUS MEMBRANES or SKIN NECROSIS: #  #  YES  #  # Has patient had a PCN reaction that required hospitalization: Unknown Has patient had a PCN reaction occurring within the last 10 years: No  . Clindamycin Rash    Diffuse rash across body    Patient Measurements: Height: 6' (182.9 cm) Weight: 117.9 kg (260 lb) IBW/kg (Calculated) : 77.6 Heparin Dosing Weight: 100 kg  Vital Signs: Temp: 98.1 F (36.7 C) (08/14 0006) Temp Source: Oral (08/14 0006) BP: 164/66 (08/14 0452) Pulse Rate: 67 (08/14 0452)  Labs: Recent Labs    12/01/19 0105 12/01/19 0319  HGB 13.1  --   HCT 40.0  --   PLT 121*  --   CREATININE 1.18  --   TROPONINIHS 21* 147*    Estimated Creatinine Clearance: 75 mL/min (by C-G formula based on SCr of 1.18 mg/dL).   Medical History: Past Medical History:  Diagnosis Date  . AAA (abdominal aortic aneurysm) (HCC)   . COPD (chronic obstructive pulmonary disease) (HCC)     Medications:   No current facility-administered medications on file prior to encounter.   Current Outpatient Medications on File Prior to Encounter  Medication Sig Dispense Refill  . Albuterol Sulfate 108 (90 Base) MCG/ACT AEPB Inhale 90 mcg into the lungs.    Marland Kitchen aspirin 325 MG tablet Take 325 mg by mouth daily. As a buffered tablet antiplatelet    . BEVESPI AEROSPHERE 9-4.8 MCG/ACT AERO SMARTSIG:2 Puff(s) By Mouth Twice Daily    . BREZTRI AEROSPHERE 160-9-4.8 MCG/ACT AERO Inhale 2 puffs into the lungs 2 (two) times daily.    . enalapril (VASOTEC) 10 MG tablet Take 10 mg by mouth daily.    . hydrochlorothiazide (MICROZIDE) 12.5 MG  capsule Take 12.5 mg by mouth daily.    Marland Kitchen LORazepam (ATIVAN) 1 MG tablet Take 1 mg by mouth daily as needed.    . Multiple Vitamins-Minerals (CENTRUM SILVER 50+MEN PO) Take by mouth.    . rosuvastatin (CRESTOR) 10 MG tablet Take 1 tablet (10 mg total) by mouth daily.      Assessment: 74 y.o. male with chest pain for heparin  Goal of Therapy:  Heparin level 0.3-0.7 units/ml Monitor platelets by anticoagulation protocol: Yes   Plan:  Heparin 4000 units IV bolus, then start heparin 1400 units/hr Check heparin level in 6 hours.   Eddie Candle 12/01/2019,5:30 AM

## 2019-12-01 NOTE — Progress Notes (Signed)
CRITICAL VALUE ALERT  Critical Value:  Trop. 188  Date & Time Notied:  12/01/2019 1720  Provider Notified: Dr. Mayford Knife  Orders Received/Actions taken: awaiting return call.

## 2019-12-01 NOTE — ED Notes (Signed)
Date and time results received: 12/01/19 4:46 AM(use smartphrase ".now" to insert current time)  Test: troponin Critical Value: 147  Name of Provider Notified: Dr Manus Gunning  Orders Received? Or Actions Taken?: see chart

## 2019-12-01 NOTE — ED Triage Notes (Signed)
Pt c/o left sided chest pain, dizziness, shortness of breath, and just not feeling right that all started tonight.

## 2019-12-01 NOTE — Progress Notes (Signed)
ANTICOAGULATION CONSULT NOTE  Pharmacy Consult for Heparin Indication: chest pain/ACS  Allergies  Allergen Reactions  . Penicillins Hives and Rash    Childhood reaction Has patient had a PCN reaction causing immediate rash, facial/tongue/throat swelling, SOB or lightheadedness with hypotension: No PT DEVELOPED ## SEVERE ## RASH INVOLVING MUCUS MEMBRANES or SKIN NECROSIS: #  #  YES  #  # Has patient had a PCN reaction that required hospitalization: Unknown Has patient had a PCN reaction occurring within the last 10 years: No  . Clindamycin Rash    Diffuse rash across body    Patient Measurements: Height: 6' (182.9 cm) Weight: 117.9 kg (260 lb) IBW/kg (Calculated) : 77.6 Heparin Dosing Weight: 100 kg  Vital Signs: Temp: 98.2 F (36.8 C) (08/14 2015) Temp Source: Oral (08/14 2015) BP: 155/71 (08/14 2015) Pulse Rate: 70 (08/14 2131)  Labs: Recent Labs    12/01/19 0105 12/01/19 0105 12/01/19 0319 12/01/19 1159 12/01/19 1608 12/01/19 1819 12/01/19 2220  HGB 13.1  --   --   --   --   --   --   HCT 40.0  --   --   --   --   --   --   PLT 121*  --   --   --   --   --   --   HEPARINUNFRC  --   --   --  0.22*  --   --  0.37  CREATININE 1.18  --   --   --   --   --   --   TROPONINIHS 21*   < > 147*  --  188* 162*  --    < > = values in this interval not displayed.    Estimated Creatinine Clearance: 75 mL/min (by C-G formula based on SCr of 1.18 mg/dL).   Medical History: Past Medical History:  Diagnosis Date  . Abdominal aortic aneurysm    no AAA on [place] in 2020  . Chronic obstructive pulmonary disease   . HLD (hyperlipidemia)   . HTN (hypertension)   . Peripheral artery disease    s/p R fem pop // s/p R transmet amp    Medications:   No current facility-administered medications on file prior to encounter.   Current Outpatient Medications on File Prior to Encounter  Medication Sig Dispense Refill  . aspirin 325 MG tablet Take 325 mg by mouth daily. As a  buffered tablet antiplatelet    . BREZTRI AEROSPHERE 160-9-4.8 MCG/ACT AERO Inhale 2 puffs into the lungs 2 (two) times daily.    . enalapril (VASOTEC) 10 MG tablet Take 10 mg by mouth daily.    . hydrochlorothiazide (HYDRODIURIL) 12.5 MG tablet Take 12.5 mg by mouth every morning.    . Multiple Vitamins-Minerals (CENTRUM SILVER 50+MEN PO) Take by mouth.    . rosuvastatin (CRESTOR) 40 MG tablet SMARTSIG:1 Tablet(s) By Mouth Every Evening    . traZODone (DESYREL) 50 MG tablet TAKE 1 TO 2 TABLETS BY MOUTH EVERY DAY AT BEDTIME AS NEEDED FOR INSOMNIA    . [DISCONTINUED] hydrochlorothiazide (MICROZIDE) 12.5 MG capsule Take 12.5 mg by mouth daily.    . [DISCONTINUED] rosuvastatin (CRESTOR) 10 MG tablet Take 1 tablet (10 mg total) by mouth daily.    . Albuterol Sulfate 108 (90 Base) MCG/ACT AEPB Inhale 90 mcg into the lungs.    . [DISCONTINUED] BEVESPI AEROSPHERE 9-4.8 MCG/ACT AERO SMARTSIG:2 Puff(s) By Mouth Twice Daily    . [DISCONTINUED] LORazepam (ATIVAN) 1 MG tablet  Take 1 mg by mouth daily as needed.      Assessment: 74 y.o. male presents as a transfer from [facility] with chest pain. The patient does not take anticoagulation PTA. Pharmacy is consulted to dose heparin for ACS.   Heparin level came back therapeutic at 0.37, on 1600 units/hr. Trop increased slightly to 162. No s/sx of bleeding or infusion issues.   Goal of Therapy:  Heparin level 0.3-0.7 units/ml Monitor platelets by anticoagulation protocol: Yes   Plan:  Continue heparin IV to 1600 units/hr Monitor daily heparin level and CBC Monitor for signs and symptoms of bleeding  Sherron Monday, PharmD, BCCCP Clinical Pharmacist  Phone: [phone] 12/01/2019 10:57 PM  Please check AMION for all [facility] Pharmacy phone numbers After 10:00 PM, call Main Pharmacy [phone]

## 2019-12-01 NOTE — H&P (Signed)
Cardiology Admission History and Physical:   Patient ID: Jorge Clements MRN: [id number]; DOB: 1946-02-21   Admission date: 12/01/2019  Primary Care Provider: Benita Stabile, MD [facility] Cardiologist: New >> will f/u in [facility] Electrophysiologist:  None   VVS:  Jorge Livings, MD  Chief Complaint:  Chest pain   Patient Profile:   Jorge Clements is a 74 y.o. male with hx as noted below who presents with chest pain and elevated cardiac markers c/w non-STEMI.    Peripheral arterial disease  S/p R femoral to popliteal above-the-knee bypass  S/p R transmet amputation  Abdominal aortic aneurysm on A-gram in past  [place] in 01/2019: no AAA  Hypertension   Hyperlipidemia   COPD  History of Present Illness:   Mr. Jorge Clements was in his USOH until last night around 11:00 pm.  He had sudden onset of lightheadedness and L sided CP described as tightness.  He had no radiation, nausea or syncope. He is chronically short of breath.  He felt his breathing was somewhat worse.  He continued to have chest pain until he came to the ED and was started on IV NTG.  He is now pain free and has no further complaints.   Data K 3.9, Cr 1.18, ALT 22, Hgb 13.1 Hs-Trop 21 >> 147  SARS-CoV-2 neg CXR:  No acute findings, aortic atherosclerosis EKG: NSR, HR 93, LAD, anteroseptal Q waves, ~1 mm ST depression laterally (1, aVL, V5-V6), QTC 415  Rx ASA 325, IV Heparin, IV NTG Enalapril, HCTZ, Rosuvastatin 10  Past Medical History:  Diagnosis Date   Abdominal aortic aneurysm    no AAA on [place] in 2020   Chronic obstructive pulmonary disease    HLD (hyperlipidemia)    HTN (hypertension)    Peripheral artery disease    s/p R fem pop // s/p R transmet amp    Past Surgical History:  Procedure Laterality Date   ABDOMINAL AORTOGRAM N/A 08/17/2017   Procedure: ABDOMINAL AORTOGRAM;  Surgeon: Jorge Harman, MD;  Location: [facility] INVASIVE CV LAB;  Service: Cardiovascular;   Laterality: N/A;   AMPUTATION Right 08/21/2017   Procedure: TRANSMETATARSAL AMPUTATION RIGHT FOOT;  Surgeon: Jorge Mustard, MD;  Location: [facility] OR;  Service: Orthopedics;  Laterality: Right;   AMPUTATION TOE Right 08/18/2017   Procedure: AMPUTATION RIGHT SECOND AND THIRD TOES;  Surgeon: Jorge Harman, MD;  Location[facility] OR;  Service: Vascular;  Laterality: Right;   ENDARTERECTOMY FEMORAL Right 08/18/2017   Procedure: ENDARTERECTOMY RIGHT SUPERFICIAL Jorge Clements;  Surgeon: Jorge Harman, MD;  Location: [facility] OR;  Service: Vascular;  Laterality: Right;   FEMORAL-POPLITEAL BYPASS GRAFT Right 08/18/2017   Procedure: BYPASS GRAFT RIGHT COMMON FEMORAL TO ABOVE KNEE POPLITEAL ARTERY USING RIGHT REVERSED GREAT SAPHENOUS VEIN;  Surgeon: Jorge Harman, MD;  Location: [facility] OR;  Service: Vascular;  Laterality: Right;   LOWER EXTREMITY ANGIOGRAPHY Right 08/17/2017   Procedure: Lower Extremity Angiography;  Surgeon: Jorge Harman, MD;  Location: [facility] INVASIVE CV LAB;  Service: Cardiovascular;  Laterality: Right;   PERIPHERAL VASCULAR BALLOON ANGIOPLASTY Right 08/17/2017   Procedure: PERIPHERAL VASCULAR BALLOON ANGIOPLASTY;  Surgeon: Jorge Harman, MD;  Location: [facility] INVASIVE CV LAB;  Service: Cardiovascular;  Laterality: Right;  SFA UNABLE TO CROSS   VEIN HARVEST Right 08/18/2017   Procedure: VEIN HARVEST RIGHT GREAT SAPHENOUS;  Surgeon: Jorge Harman, MD;  Location: [facility] OR;  Service: Vascular;  Laterality: Right;   WOUND DEBRIDEMENT Right 08/18/2017   Procedure: DEBRIDEMENT  WOUND RIGHT FOOT;  Surgeon: Jorge Harmanain, Jorge Christopher, MD;  [facility];  Service: Vascular;  Laterality: Right;     Medications Prior to Admission: Prior to Admission medications   Medication Sig Start Date End Date Taking? Authorizing Provider  aspirin 325 MG tablet Take 325 mg by mouth daily. As a buffered tablet antiplatelet   Yes [provider]  BREZTRI  AEROSPHERE 160-9-4.8 MCG/ACT AERO Inhale 2 puffs into the lungs 2 (two) times daily. 07/09/19  Yes [provider]  enalapril (VASOTEC) 10 MG tablet Take 10 mg by mouth daily.   Yes [provider]  hydrochlorothiazide (HYDRODIURIL) 12.5 MG tablet Take 12.5 mg by mouth every morning. 11/12/19  Yes [provider]  Multiple Vitamins-Minerals (CENTRUM SILVER 50+MEN PO) Take by mouth.   Yes [provider]  rosuvastatin (CRESTOR) 40 MG tablet SMARTSIG:1 Tablet(s) By Mouth Every Evening 09/03/19  Yes [provider]  traZODone (DESYREL) 50 MG tablet TAKE 1 TO 2 TABLETS BY MOUTH EVERY DAY AT BEDTIME AS NEEDED FOR INSOMNIA 11/06/19  Yes [provider]  Albuterol Sulfate 108 (90 Base) MCG/ACT AEPB Inhale 90 mcg into the lungs.    [provider]     Allergies:    Allergies  Allergen Reactions   Penicillins Hives and Rash    Childhood reaction Has patient had a PCN reaction causing immediate rash, facial/tongue/throat swelling, SOB or lightheadedness with hypotension: No PT DEVELOPED ## SEVERE ## RASH INVOLVING MUCUS MEMBRANES or SKIN NECROSIS: #  #  YES  #  # Has patient had a PCN reaction that required hospitalization: Unknown Has patient had a PCN reaction occurring within the last 10 years: No   Clindamycin Rash    Diffuse rash across body    Social History:   Social History   Socioeconomic History   Marital status: Divorced    Spouse name: Not on file   Number of children: Not on file   Years of education: Not on file   Highest education level: Not on file  Occupational History   Not on file  Tobacco Use   Smoking status: Former Smoker   Smokeless tobacco: Never Used  Building services engineerVaping Use   Vaping Use: Former  Substance and Sexual Activity   Alcohol use: Not Currently   Drug use: No   Sexual activity: Not on file  Other Topics Concern   Not on file  Social History Narrative   Not on file   Social Determinants  of Health   Financial Resource Strain:    Difficulty of Paying Living Expenses:   Food Insecurity:    Worried About Programme researcher, broadcasting/film/videounning Out of Food in the Last Year:    Baristaan Out of Food in the Last Year:   Transportation Needs:    Freight forwarderLack of Transportation (Medical):    Lack of Transportation (Non-Medical):   Physical Activity:    Days of Exercise per Week:    Minutes of Exercise per Session:   Stress:    Feeling of Stress :   Social Connections:    Frequency of Communication with Friends and Family:    Frequency of Social Gatherings with Friends and Family:    Attends Religious Services:    Active Member of Clubs or Organizations:    Attends BankerClub or Organization Meetings:    Marital Status:   Intimate Partner Violence:    Fear of Current or Ex-Partner:    Emotionally Abused:    Physically Abused:    Sexually Abused:  Family History:    The patient's family history is not on file. He was adopted.    ROS:  Please see the history of present illness.  He has not had fevers, cough, vomiting, diarrhea, melena, hematochezia, hematuria. All other ROS reviewed and negative.     Physical Exam/Data:   Vitals:   12/01/19 0930 12/01/19 1000 12/01/19 1133 12/01/19 1302  BP: (!) 158/73 (!) 195/74 (!) 169/64 (!) 147/82  Pulse: 67  69 68  Resp: 17 20 20    Temp:   98.4 F (36.9 C)   TempSrc:   Oral   SpO2: 100%  98%   Weight:      Height:        Intake/Output Summary (Last 24 hours) at 12/01/2019 1338 Last data filed at 12/01/2019 0714 Gross per 24 hour  Intake --  Output 350 ml  Net -350 ml   Last 3 Weights 12/01/2019 07/30/2019 01/26/2019  Weight (lbs) 260 lb 265 lb 248 lb 6.4 oz  Weight (kg) 117.935 kg 120.203 kg 112.674 kg     Body mass index is 35.26 kg/m.  General:  Well nourished, well developed, in no acute distress  HEENT: normal Lymph: no adenopathy Neck: no  JVD Endocrine:  No thryomegaly Vascular: No carotid bruits  Cardiac:  normal S1, S2; RRR; no  murmur   Lungs:  Decreased breath sounds, no wheezing, rhonchi or rales  Abd: soft, nontender, no hepatomegaly  Ext: trace-1+ RLE edema; trace LLE edema Musculoskeletal:  R transmet amputation Skin: warm and dry, medial foot with large plaque/lichenified  Neuro:  CNs 2-12 intact, no focal abnormalities noted Psych:  Normal affect    EKG:  The ECG that was done   was personally reviewed and demonstrates - see above   Laboratory Data:  High Sensitivity Troponin:   Recent Labs  Lab 12/01/19 0105 12/01/19 0319  TROPONINIHS 21* 147*      Chemistry Recent Labs  Lab 12/01/19 0105  NA 134*  K 3.9  CL 98  CO2 25  GLUCOSE 181*  BUN 25*  CREATININE 1.18  CALCIUM 9.4  GFRNONAA >60  GFRAA >60  ANIONGAP 11    Recent Labs  Lab 12/01/19 0105  PROT 7.0  ALBUMIN 3.9  AST 20  ALT 22  ALKPHOS 75  BILITOT 0.7   Hematology Recent Labs  Lab 12/01/19 0105  WBC 9.1  RBC 4.42  HGB 13.1  HCT 40.0  MCV 90.5  MCH 29.6  MCHC 32.8  RDW 14.4  PLT 121*    Radiology/Studies:  DG Chest Portable 1 View  Result Date: 12/01/2019 CLINICAL DATA:  Chest pain. EXAM: PORTABLE CHEST 1 VIEW COMPARISON:  08/11/2017 FINDINGS: Chronic hyperinflation. Slight paucity of upper lobe lung markings suggests emphysema. No focal airspace disease. Unchanged heart size and mediastinal contours. Aortic atherosclerosis. No pneumothorax or large pleural effusion. No acute osseous abnormalities are seen. IMPRESSION: Chronic hyperinflation without acute abnormality. Aortic Atherosclerosis (ICD10-I70.0). Electronically Signed   By: 08/13/2017 M.D.   On: 12/01/2019 01:58       TIMI Risk Score for Unstable Angina or Non-ST Elevation MI:   The patient's TIMI risk score is 5, which indicates a 26% risk of all cause mortality, new or recurrent myocardial infarction or need for urgent revascularization in the next 14 days.      Assessment and Plan:   1. NSTEMI He is currently pain free.  Continue  Heparin, NTG,  ACE inhibitor, statin.  Start Metoprolol tartrate 25 mg  twice daily.  Recheck troponins.  Check echocardiogram to assess LVF.  Plan cardiac cath Monday.  Risks and benefits of cardiac catheterization have been discussed with the patient.  These include bleeding, infection, kidney damage, stroke, heart attack, death.  The patient understands these risks and is willing to proceed.  2. Hypertension  BP elevated.  Continue ACE inhibitor.  Start beta-blocker as noted above.  Monitor.  3. Hyperlipidemia  Increase rosuvastatin to 40 mg once daily.  4. Peripheral arterial disease  Followed by VVS.  Overall stable.  5. COPD Continue current Rx.   Severity of Illness: The appropriate patient status for this patient is INPATIENT. Inpatient status is judged to be reasonable and necessary in order to provide the required intensity of service to ensure the patient's safety. The patient's presenting symptoms, physical exam findings, and initial radiographic and laboratory data in the context of their chronic comorbidities is felt to place them at high risk for further clinical deterioration. Furthermore, it is not anticipated that the patient will be medically stable for discharge from the hospital within 2 midnights of admission. The following factors support the patient status of inpatient.   " The patient's presenting symptoms include chest pain. " The worrisome physical exam findings include n/a. " The initial radiographic and laboratory data are worrisome because of abnormal EKG, elevated troponin. " The chronic co-morbidities include peripheral arterial disease, hypertension, hyperlipidemia, COPD.   * I certify that at the point of admission it is my clinical judgment that the patient will require inpatient hospital care spanning beyond 2 midnights from the point of admission due to high intensity of service, high risk for further deterioration and high frequency of surveillance  required.*    For questions or updates, please contact [facility] Please consult [email or web address] for contact info under     Signed, Tereso Newcomer, PA-C  12/01/2019 1:38 PM

## 2019-12-01 NOTE — Progress Notes (Signed)
Received patient from [facility] via [facility]. Patient pleasant AAOx4 no c/o pain. Toes on right amputated. Skin to heel and top of foot crusted over.  Patient stated that he was going to the wound clinic three times a week.  The areas had cleared but scabby crusted skin appeared about 4 months ago.  Oriented to surroundings . Call bell, phone , and urinal within reach.

## 2019-12-01 NOTE — Progress Notes (Signed)
ANTICOAGULATION CONSULT NOTE  Pharmacy Consult for Heparin Indication: chest pain/ACS  Allergies  Allergen Reactions  . Penicillins Hives and Rash    Childhood reaction Has patient had a PCN reaction causing immediate rash, facial/tongue/throat swelling, SOB or lightheadedness with hypotension: No PT DEVELOPED ## SEVERE ## RASH INVOLVING MUCUS MEMBRANES or SKIN NECROSIS: #  #  YES  #  # Has patient had a PCN reaction that required hospitalization: Unknown Has patient had a PCN reaction occurring within the last 10 years: No  . Clindamycin Rash    Diffuse rash across body    Patient Measurements: Height: 6' (182.9 cm) Weight: 117.9 kg (260 lb) IBW/kg (Calculated) : 77.6 Heparin Dosing Weight: 100 kg  Vital Signs: Temp: 98.4 F (36.9 C) (08/14 1133) Temp Source: Oral (08/14 1133) BP: 147/82 (08/14 1302) Pulse Rate: 68 (08/14 1302)  Labs: Recent Labs    12/01/19 0105 12/01/19 0319 12/01/19 1159  HGB 13.1  --   --   HCT 40.0  --   --   PLT 121*  --   --   HEPARINUNFRC  --   --  0.22*  CREATININE 1.18  --   --   TROPONINIHS 21* 147*  --     Estimated Creatinine Clearance: 75 mL/min (by C-G formula based on SCr of 1.18 mg/dL).   Medical History: Past Medical History:  Diagnosis Date  . Abdominal aortic aneurysm    no AAA on [place] in 2020  . Chronic obstructive pulmonary disease   . HLD (hyperlipidemia)   . HTN (hypertension)   . Peripheral artery disease    s/p R fem pop // s/p R transmet amp    Medications:   No current facility-administered medications on file prior to encounter.   Current Outpatient Medications on File Prior to Encounter  Medication Sig Dispense Refill  . aspirin 325 MG tablet Take 325 mg by mouth daily. As a buffered tablet antiplatelet    . BREZTRI AEROSPHERE 160-9-4.8 MCG/ACT AERO Inhale 2 puffs into the lungs 2 (two) times daily.    . enalapril (VASOTEC) 10 MG tablet Take 10 mg by mouth daily.    . hydrochlorothiazide (HYDRODIURIL)  12.5 MG tablet Take 12.5 mg by mouth every morning.    . Multiple Vitamins-Minerals (CENTRUM SILVER 50+MEN PO) Take by mouth.    . rosuvastatin (CRESTOR) 40 MG tablet SMARTSIG:1 Tablet(s) By Mouth Every Evening    . traZODone (DESYREL) 50 MG tablet TAKE 1 TO 2 TABLETS BY MOUTH EVERY DAY AT BEDTIME AS NEEDED FOR INSOMNIA    . [DISCONTINUED] hydrochlorothiazide (MICROZIDE) 12.5 MG capsule Take 12.5 mg by mouth daily.    . [DISCONTINUED] rosuvastatin (CRESTOR) 10 MG tablet Take 1 tablet (10 mg total) by mouth daily.    . Albuterol Sulfate 108 (90 Base) MCG/ACT AEPB Inhale 90 mcg into the lungs.    . [DISCONTINUED] BEVESPI AEROSPHERE 9-4.8 MCG/ACT AERO SMARTSIG:2 Puff(s) By Mouth Twice Daily    . [DISCONTINUED] LORazepam (ATIVAN) 1 MG tablet Take 1 mg by mouth daily as needed.      Assessment: 74 y.o. male presents as a transfer from [facility] with chest pain. The patient does not take anticoagulation PTA. Pharmacy is consulted to dose heparin for ACS. The patients CBC is WNL, most recent troponin was 147, and there are no signs or symptoms of bleeding documented. The patient's 6-hr heparin level resulted slightly subtherapeutic at 0.22.   Goal of Therapy:  Heparin level 0.3-0.7 units/ml Monitor platelets by anticoagulation protocol:  Yes   Plan:  Increase heparin IV to 1600 units/hr Check 8-hour heparin level Monitor daily heparin level and CBC Monitor for signs and symptoms of bleeding  Sanda Klein, PharmD, RPh  PGY-1 Pharmacy Resident 12/01/2019 1:18 PM  Please check [email or web address] for unit-specific pharmacy phone numbers.

## 2019-12-01 NOTE — ED Provider Notes (Signed)
[facility] EMERGENCY DEPARTMENT Provider Note   CSN: [id number] Arrival date & time: 11/30/19  2355     History Chief Complaint  Patient presents with  . Chest Pain    Jorge Clements is a 74 y.o. male.  Patient with history of COPD, peripheral vascular disease status post bypass of the right leg, hypertension presenting with episode of chest tightness, lightheadedness, dizziness, shortness of breath and onset around 11 PM while he was resting.  Reports sudden onset of tight sensation on left side of his chest with burning in his left arm.  Associated with shortness of breath, nausea lightheadedness.  Denies any room spinning dizziness.  He is not certain how long this pain lasted.  Believes about 1 hour prior to arrival to the hospital.  Has not had this pain in the past.  Denies any cardiac history.  Never had a stress test or catheterization.  No abdominal pain or back pain.  Chronic swelling his right leg is unchanged.   Chest Pain Associated symptoms: dizziness, nausea, shortness of breath and weakness   Associated symptoms: no abdominal pain, no cough, no fever, no headache and no vomiting        Past Medical History:  Diagnosis Date  . AAA (abdominal aortic aneurysm) (HCC)   . COPD (chronic obstructive pulmonary disease) [facility])     Patient Active Problem List   Diagnosis Date Noted  . Status post transmetatarsal amputation of right foot (HCC)   . Subacute osteomyelitis, right ankle and foot (HCC)   . Infected blister of foot 08/13/2017  . Pressure injury of skin 08/12/2017  . Weakness 08/11/2017  . COPD (chronic obstructive pulmonary disease) (HCC) 08/11/2017  . Cellulitis of right lower extremity 08/11/2017  . Fall at home, initial encounter 08/11/2017  . Thrombocytopenia (HCC) 08/11/2017  . Weakness generalized 08/11/2017  . Hypokalemia 08/11/2017  . Essential hypertension 08/11/2017    Past Surgical History:  Procedure Laterality Date  . ABDOMINAL AORTOGRAM  N/A 08/17/2017   Procedure: ABDOMINAL AORTOGRAM;  Surgeon: Maeola Harman, MD;  Location: [facility] INVASIVE CV LAB;  Service: Cardiovascular;  Laterality: N/A;  . AMPUTATION Right 08/21/2017   Procedure: TRANSMETATARSAL AMPUTATION RIGHT FOOT;  Surgeon: Nadara Mustard, MD;  Location: [facility] OR;  Service: Orthopedics;  Laterality: Right;  . AMPUTATION TOE Right 08/18/2017   Procedure: AMPUTATION RIGHT SECOND AND THIRD TOES;  Surgeon: Maeola Harman, MD;  Location: [facility] OR;  Service: Vascular;  Laterality: Right;  . ENDARTERECTOMY FEMORAL Right 08/18/2017   Procedure: ENDARTERECTOMY RIGHT SUPERFICIAL Alfredia Ferguson;  Surgeon: Maeola Harman, MD;  Location: [facility] OR;  Service: Vascular;  Laterality: Right;  . FEMORAL-POPLITEAL BYPASS GRAFT Right 08/18/2017   Procedure: BYPASS GRAFT RIGHT COMMON FEMORAL TO ABOVE KNEE POPLITEAL ARTERY USING RIGHT REVERSED GREAT SAPHENOUS VEIN;  Surgeon: Maeola Harman, MD;  Location: [facility] OR;  Service: Vascular;  Laterality: Right;  . LOWER EXTREMITY ANGIOGRAPHY Right 08/17/2017   Procedure: Lower Extremity Angiography;  Surgeon: Maeola Harman, MD;  Location: [facility] INVASIVE CV LAB;  Service: Cardiovascular;  Laterality: Right;  . PERIPHERAL VASCULAR BALLOON ANGIOPLASTY Right 08/17/2017   Procedure: PERIPHERAL VASCULAR BALLOON ANGIOPLASTY;  Surgeon: Maeola Harman, MD;  Location: [facility] INVASIVE CV LAB;  Service: Cardiovascular;  Laterality: Right;  SFA UNABLE TO CROSS  . VEIN HARVEST Right 08/18/2017   Procedure: VEIN HARVEST RIGHT GREAT SAPHENOUS;  Surgeon: Maeola Harman, MD;  Location: [facility] OR;  Service: Vascular;  Laterality: Right;  . WOUND DEBRIDEMENT Right 08/18/2017  Procedure: DEBRIDEMENT WOUND RIGHT FOOT;  Surgeon: Maeola Harman, MD;  Location: [facility] OR;  Service: Vascular;  Laterality: Right;       Family History  Adopted: Yes    Social History   Tobacco Use  . Smoking status: Former Games developer  . Smokeless  tobacco: Never Used  Vaping Use  . Vaping Use: Former  Substance Use Topics  . Alcohol use: Not Currently  . Drug use: No    Home Medications Prior to Admission medications   Medication Sig Start Date End Date Taking? Authorizing Provider  Albuterol Sulfate 108 (90 Base) MCG/ACT AEPB Inhale 90 mcg into the lungs.    [provider]  aspirin 325 MG tablet Take 325 mg by mouth daily. As a buffered tablet antiplatelet    [provider]  BEVESPI AEROSPHERE 9-4.8 MCG/ACT AERO SMARTSIG:2 Puff(s) By Mouth Twice Daily 06/18/19   [provider]  BREZTRI AEROSPHERE 160-9-4.8 MCG/ACT AERO Inhale 2 puffs into the lungs 2 (two) times daily. 07/09/19   [provider]  enalapril (VASOTEC) 10 MG tablet Take 10 mg by mouth daily.    [provider]  hydrochlorothiazide (MICROZIDE) 12.5 MG capsule Take 12.5 mg by mouth daily.    [provider]  LORazepam (ATIVAN) 1 MG tablet Take 1 mg by mouth daily as needed. 07/04/19   [provider]  Multiple Vitamins-Minerals (CENTRUM SILVER 50+MEN PO) Take by mouth.    [provider]  rosuvastatin (CRESTOR) 10 MG tablet Take 1 tablet (10 mg total) by mouth daily. 08/22/17   Hongalgi, Maximino Greenland, MD    Allergies    Penicillins and Clindamycin  Review of Systems   Review of Systems  Constitutional: Negative for activity change, appetite change and fever.  HENT: Negative for congestion and rhinorrhea.   Respiratory: Positive for shortness of breath. Negative for cough and chest tightness.   Cardiovascular: Positive for chest pain.  Gastrointestinal: Positive for nausea. Negative for abdominal pain and vomiting.  Genitourinary: Negative for dysuria and hematuria.  Musculoskeletal: Negative for arthralgias and myalgias.  Neurological: Positive for dizziness, weakness and light-headedness. Negative for headaches.   all other systems are negative except as noted in the HPI and PMH.    Physical  Exam Updated Vital Signs BP (!) 148/72   Pulse 96   Temp 98.1 F (36.7 C) (Oral)   Resp 20   Ht 6' (1.829 m)   Wt 117.9 kg   SpO2 95%   BMI 35.26 kg/m   Physical Exam Vitals and nursing note reviewed.  Constitutional:      General: He is not in acute distress.    Appearance: He is well-developed. He is obese.  HENT:     Head: Normocephalic and atraumatic.     Mouth/Throat:     Pharynx: No oropharyngeal exudate.  Eyes:     Conjunctiva/sclera: Conjunctivae normal.     Pupils: Pupils are equal, round, and reactive to light.  Neck:     Comments: No meningismus. Cardiovascular:     Rate and Rhythm: Normal rate and regular rhythm.     Heart sounds: Normal heart sounds. No murmur heard.   Pulmonary:     Effort: Pulmonary effort is normal. No respiratory distress.     Breath sounds: Normal breath sounds.  Chest:     Chest wall: No tenderness.  Abdominal:     Palpations: Abdomen is soft.     Tenderness: There is no abdominal tenderness. There is no guarding or  rebound.  Musculoskeletal:        General: Swelling present. No tenderness. Normal range of motion.     Cervical back: Normal range of motion and neck supple.     Right lower leg: Edema present.     Comments: Venous stasis changes of right leg with increased swelling compared to the left.  Transmetatarsal amputation on the right.  Hyperkeratotic changes of the foot.  Intact DP and PT pulses with Doppler.  Skin:    General: Skin is warm.  Neurological:     Mental Status: He is alert and oriented to person, place, and time.     Cranial Nerves: No cranial nerve deficit.     Motor: No abnormal muscle tone.     Coordination: Coordination normal.     Comments: No ataxia on finger to nose bilaterally. No pronator drift. 5/5 strength throughout. CN 2-12 intact.Equal grip strength. Sensation intact.   Psychiatric:        Behavior: Behavior normal.     ED Results / Procedures / Treatments   Labs (all labs ordered are  listed, but only abnormal results are displayed) Labs Reviewed  CBC WITH DIFFERENTIAL/PLATELET - Abnormal; Notable for the following components:      Result Value   Platelets 121 (*)    Abs Immature Granulocytes 0.08 (*)    All other components within normal limits  COMPREHENSIVE METABOLIC PANEL - Abnormal; Notable for the following components:   Sodium 134 (*)    Glucose, Bld 181 (*)    BUN 25 (*)    All other components within normal limits  TROPONIN I (HIGH SENSITIVITY) - Abnormal; Notable for the following components:   Troponin I (High Sensitivity) 21 (*)    All other components within normal limits  TROPONIN I (HIGH SENSITIVITY) - Abnormal; Notable for the following components:   Troponin I (High Sensitivity) 147 (*)    All other components within normal limits  SARS CORONAVIRUS 2 BY RT PCR (HOSPITAL ORDER, PERFORMED IN [facility] LAB)  LIPASE, BLOOD    EKG EKG Interpretation  Date/Time:  Saturday December 01 2019 00:03:52 EDT Ventricular Rate:  93 PR Interval:    QRS Duration: 68 QT Interval:  334 QTC Calculation: 415 R Axis:   -51 Text Interpretation: Normal sinus rhythm Left axis deviation Low voltage QRS Cannot rule out Anteroseptal infarct , age undetermined Abnormal ECG slight elevation aVR lateral ST depressions Confirmed by Glynn Octave [phone]) on 12/01/2019 12:20:32 AM   Radiology DG Chest Portable 1 View  Result Date: 12/01/2019 CLINICAL DATA:  Chest pain. EXAM: PORTABLE CHEST 1 VIEW COMPARISON:  08/11/2017 FINDINGS: Chronic hyperinflation. Slight paucity of upper lobe lung markings suggests emphysema. No focal airspace disease. Unchanged heart size and mediastinal contours. Aortic atherosclerosis. No pneumothorax or large pleural effusion. No acute osseous abnormalities are seen. IMPRESSION: Chronic hyperinflation without acute abnormality. Aortic Atherosclerosis (ICD10-I70.0). Electronically Signed   By: Narda Rutherford M.D.   On: 12/01/2019 01:58     Procedures .Critical Care Performed by: Glynn Octave, MD Authorized by: Glynn Octave, MD   Critical care provider statement:    Critical care time (minutes):  45   Critical care was necessary to treat or prevent imminent or life-threatening deterioration of the following conditions: acute coronary syndrome.   Critical care was time spent personally by me on the following activities:  Discussions with consultants, evaluation of patient's response to treatment, examination of patient, ordering and performing treatments and interventions, ordering and review of  laboratory studies, ordering and review of radiographic studies, pulse oximetry, re-evaluation of patient's condition, obtaining history from patient or surrogate and review of old charts   (including critical care time)  Medications Ordered in ED Medications  aspirin chewable tablet 324 mg (324 mg Oral Given 12/01/19 0124)  nitroGLYCERIN (NITROSTAT) SL tablet 0.4 mg (0.4 mg Sublingual Given 12/01/19 0126)    ED Course  I have reviewed the triage vital signs and the nursing notes.  Pertinent labs & imaging results that were available during my care of the patient were reviewed by me and considered in my medical decision making (see chart for details).    MDM Rules/Calculators/A&P                         Vasculopath with chest tightness, shortness of breath, nausea, dizziness and lightheadedness.  EKG shows ST elevation lateral depressions more pronounced than previous.  Aspirin and nitroglycerin given.  EKG does show anterior Q waves with more pronounced ST depressions laterally.  Initial troponin is 21. Patient given aspirin nitroglycerin with improvement of his chest tightness and dizziness and shortness of breath.  Troponin repeat is 147. Patient's pain has resolved.  Will initiate IV heparin as well as IV nitroglycerin to maintain pain-free state. Low suspicion for pulmonary embolism or aortic  dissection.  [facility] ACS discussed with Dr. Deforest HoylesAkhter who accepts patient to stepdown bed on behalf of Dr. Wyline MoodBranch.   On recheck, patient has no chest pain. He is agreeable to transfer. Final Clinical Impression(s) / ED Diagnoses Final diagnoses:  ACS (acute coronary syndrome) [facility](HCC)    Rx / DC Orders ED Discharge Orders    None       Kjersten Ormiston, Jeannett SeniorStephen, MD 12/01/19 [phone]

## 2019-12-02 ENCOUNTER — Inpatient Hospital Stay (HOSPITAL_COMMUNITY): Payer: Medicare HMO

## 2019-12-02 DIAGNOSIS — I214 Non-ST elevation (NSTEMI) myocardial infarction: Secondary | ICD-10-CM | POA: Diagnosis not present

## 2019-12-02 LAB — ECHOCARDIOGRAM COMPLETE
Area-P 1/2: 3.39 cm2
Height: 72 in
MV M vel: 1.91 m/s
MV Peak grad: 14.6 mmHg
S' Lateral: 3.1 cm
Weight: 4138.3 oz

## 2019-12-02 LAB — HEMOGLOBIN A1C
Hgb A1c MFr Bld: 6.4 % — ABNORMAL HIGH (ref 4.8–5.6)
Mean Plasma Glucose: 136.98 mg/dL

## 2019-12-02 LAB — CBC
HCT: 41.1 % (ref 39.0–52.0)
Hemoglobin: 13.4 g/dL (ref 13.0–17.0)
MCH: 29.3 pg (ref 26.0–34.0)
MCHC: 32.6 g/dL (ref 30.0–36.0)
MCV: 89.7 fL (ref 80.0–100.0)
Platelets: 125 10*3/uL — ABNORMAL LOW (ref 150–400)
RBC: 4.58 MIL/uL (ref 4.22–5.81)
RDW: 14.5 % (ref 11.5–15.5)
WBC: 8.4 10*3/uL (ref 4.0–10.5)
nRBC: 0 % (ref 0.0–0.2)

## 2019-12-02 LAB — LIPID PANEL
Cholesterol: 103 mg/dL (ref 0–200)
HDL: 38 mg/dL — ABNORMAL LOW (ref >40)
LDL Cholesterol: 39 mg/dL (ref 0–99)
Total CHOL/HDL Ratio: 2.7 RATIO
Triglycerides: 130 mg/dL (ref <150)
VLDL: 26 mg/dL (ref 0–40)

## 2019-12-02 LAB — BASIC METABOLIC PANEL
Anion gap: 12 (ref 5–15)
BUN: 20 mg/dL (ref 8–23)
CO2: 23 mmol/L (ref 22–32)
Calcium: 9.5 mg/dL (ref 8.9–10.3)
Chloride: 99 mmol/L (ref 98–111)
Creatinine, Ser: 1.17 mg/dL (ref 0.61–1.24)
GFR calc Af Amer: >60 mL/min (ref >60)
GFR calc non Af Amer: >60 mL/min (ref >60)
Glucose, Bld: 118 mg/dL — ABNORMAL HIGH (ref 70–99)
Potassium: 4.6 mmol/L (ref 3.5–5.1)
Sodium: 134 mmol/L — ABNORMAL LOW (ref 135–145)

## 2019-12-02 LAB — HEPARIN LEVEL (UNFRACTIONATED): Heparin Unfractionated: 0.36 IU/mL (ref 0.30–0.70)

## 2019-12-02 MED ORDER — PERFLUTREN LIPID MICROSPHERE
1.0000 mL | INTRAVENOUS | Status: AC | PRN
  Administered 2019-12-02: 2 mL via INTRAVENOUS
  Filled 2019-12-02: qty 10

## 2019-12-02 NOTE — Progress Notes (Signed)
ANTICOAGULATION CONSULT NOTE  Pharmacy Consult for Heparin Indication: chest pain/ACS  Allergies  Allergen Reactions  . Penicillins Hives and Rash    Childhood reaction Has patient had a PCN reaction causing immediate rash, facial/tongue/throat swelling, SOB or lightheadedness with hypotension: No PT DEVELOPED ## SEVERE ## RASH INVOLVING MUCUS MEMBRANES or SKIN NECROSIS: #  #  YES  #  # Has patient had a PCN reaction that required hospitalization: Unknown Has patient had a PCN reaction occurring within the last 10 years: No  . Clindamycin Rash    Diffuse rash across body    Patient Measurements: Height: 6' (182.9 cm) Weight: 117.3 kg (258 lb 10.3 oz) IBW/kg (Calculated) : 77.6 Heparin Dosing Weight: 100 kg  Vital Signs: Temp: 97.8 F (36.6 C) (08/15 0946) Temp Source: Oral (08/15 0946) BP: 130/48 (08/15 0946) Pulse Rate: 61 (08/15 0946)  Labs: Recent Labs    12/01/19 0105 12/01/19 0105 12/01/19 0319 12/01/19 1159 12/01/19 1608 12/01/19 1819 12/01/19 2220 12/02/19 0926  HGB 13.1  --   --   --   --   --   --  13.4  HCT 40.0  --   --   --   --   --   --  41.1  PLT 121*  --   --   --   --   --   --  125*  HEPARINUNFRC  --   --   --  0.22*  --   --  0.37 0.36  CREATININE 1.18  --   --   --   --   --   --  1.17  TROPONINIHS 21*   < > 147*  --  188* 162*  --   --    < > = values in this interval not displayed.    Estimated Creatinine Clearance: 75.5 mL/min (by C-G formula based on SCr of 1.17 mg/dL).   Medical History: Past Medical History:  Diagnosis Date  . Abdominal aortic aneurysm    no AAA on [place] in 2020  . Chronic obstructive pulmonary disease   . HLD (hyperlipidemia)   . HTN (hypertension)   . Peripheral artery disease    s/p R fem pop // s/p R transmet amp    Medications:   No current facility-administered medications on file prior to encounter.   Current Outpatient Medications on File Prior to Encounter  Medication Sig Dispense Refill  . aspirin  325 MG tablet Take 325 mg by mouth daily. As a buffered tablet antiplatelet    . BREZTRI AEROSPHERE 160-9-4.8 MCG/ACT AERO Inhale 2 puffs into the lungs 2 (two) times daily.    . enalapril (VASOTEC) 10 MG tablet Take 10 mg by mouth daily.    . hydrochlorothiazide (HYDRODIURIL) 12.5 MG tablet Take 12.5 mg by mouth every morning.    . Multiple Vitamins-Minerals (CENTRUM SILVER 50+MEN PO) Take by mouth.    . rosuvastatin (CRESTOR) 40 MG tablet SMARTSIG:1 Tablet(s) By Mouth Every Evening    . traZODone (DESYREL) 50 MG tablet TAKE 1 TO 2 TABLETS BY MOUTH EVERY DAY AT BEDTIME AS NEEDED FOR INSOMNIA    . Albuterol Sulfate 108 (90 Base) MCG/ACT AEPB Inhale 90 mcg into the lungs.      Assessment: 74 y.o. male presents as a transfer from [facility] with chest pain. The patient does not take anticoagulation PTA. Pharmacy is consulted to dose heparin for ACS. The patients CBC is WNL on 8/15 and most recent troponin was 162 on 8/14. There  were no issues with the infusion noted by the RN and there are no signs or symptoms of bleeding documented. The patient's heparin level this morning resulted therapeutic at 0.36.   The patient is scheduled for a left heart cath tomorrow morning on 12/03/19.  Goal of Therapy:  Heparin level 0.3-0.7 units/ml Monitor platelets by anticoagulation protocol: Yes   Plan:  Continue heparin IV at 1600 units/hr Monitor daily heparin level and CBC Monitor for signs and symptoms of bleeding  Sanda Klein, PharmD, RPh  PGY-1 Pharmacy Resident 12/02/2019 11:27 AM  Please check [email or web address] for unit-specific pharmacy phone numbers.

## 2019-12-02 NOTE — Progress Notes (Signed)
  Echocardiogram 2D Echocardiogram has been performed.  Jorge Clements 12/02/2019, 2:30 PM

## 2019-12-02 NOTE — Progress Notes (Signed)
Cardiology Progress Note  Patient ID: Jorge Clements MRN: [id number] DOB: July 10, 1945 Date of Encounter: 12/02/2019  Primary Cardiologist: No primary care provider on file.  Subjective   Chief Complaint: No CP.   HPI: no CP. Troponin has peaked. Doing well. Good diuresis. Needs LHC in AM for NSTEMI.   ROS:  All other ROS reviewed and negative. Pertinent positives noted in the HPI.     Inpatient Medications  Scheduled Meds: . aspirin  81 mg Oral Daily  . enalapril  10 mg Oral Daily  . hydrochlorothiazide  12.5 mg Oral Daily  . metoprolol tartrate  25 mg Oral BID  . mometasone-formoterol  2 puff Inhalation BID  . rosuvastatin  40 mg Oral Daily   Continuous Infusions: . sodium chloride    . heparin 1,600 Units/hr (12/01/19 1929)  . nitroGLYCERIN 5 mcg/min (12/01/19 0512)   PRN Meds: acetaminophen, albuterol, nitroGLYCERIN, ondansetron (ZOFRAN) IV, traZODone   Vital Signs   Vitals:   12/01/19 2015 12/01/19 2131 12/01/19 2349 12/02/19 0622  BP: (!) 155/71  (!) 148/81 (!) 127/58  Pulse: 69 70 63 61  Resp: 19  18 19   Temp: 98.2 F (36.8 C)  98 F (36.7 C) 97.9 F (36.6 C)  TempSrc: Oral  Oral Oral  SpO2: 99%  98% 97%  Weight:    117.3 kg  Height:        Intake/Output Summary (Last 24 hours) at 12/02/2019 0901 Last data filed at 12/02/2019 0650 Gross per 24 hour  Intake 366.43 ml  Output 1800 ml  Net -1433.57 ml   Last 3 Weights 12/02/2019 12/01/2019 07/30/2019  Weight (lbs) 258 lb 10.3 oz 260 lb 265 lb  Weight (kg) 117.32 kg 117.935 kg 120.203 kg      Telemetry  Overnight telemetry shows SR 60s, which I personally reviewed.   ECG  The most recent ECG shows SR 65 bpm, old anteroseptal infarct, 1AVB, which I personally reviewed.   Physical Exam   Vitals:   12/01/19 2015 12/01/19 2131 12/01/19 2349 12/02/19 0622  BP: (!) 155/71  (!) 148/81 (!) 127/58  Pulse: 69 70 63 61  Resp: 19  18 19   Temp: 98.2 F (36.8 C)  98 F (36.7 C) 97.9 F (36.6 C)  TempSrc:  Oral  Oral Oral  SpO2: 99%  98% 97%  Weight:    117.3 kg  Height:         Intake/Output Summary (Last 24 hours) at 12/02/2019 0901 Last data filed at 12/02/2019 0650 Gross per 24 hour  Intake 366.43 ml  Output 1800 ml  Net -1433.57 ml    Last 3 Weights 12/02/2019 12/01/2019 07/30/2019  Weight (lbs) 258 lb 10.3 oz 260 lb 265 lb  Weight (kg) 117.32 kg 117.935 kg 120.203 kg    Body mass index is 35.08 kg/m.   General: Well nourished, well developed, in no acute distress Head: Atraumatic, normal size  Eyes: PEERLA, EOMI  Neck: Supple, no JVD Endocrine: No thryomegaly Cardiac: Normal S1, S2; RRR; no murmurs, rubs, or gallops Lungs: Clear to auscultation bilaterally, no wheezing, rhonchi or rales  Abd: Soft, nontender, no hepatomegaly  Ext: Diminished pulses, trace right lower extremity edema Musculoskeletal: No deformities, BUE and BLE strength normal and equal Skin: Warm and dry, no rashes   Neuro: Alert and oriented to person, place, time, and situation, CNII-XII grossly intact, no focal deficits  Psych: Normal mood and affect   Labs  High Sensitivity Troponin:   Recent Labs  Lab 12/01/19  0105 12/01/19 0319 12/01/19 1608 12/01/19 1819  TROPONINIHS 21* 147* 188* 162*     Cardiac EnzymesNo results for input(s): TROPONINI in the last 168 hours. No results for input(s): TROPIPOC in the last 168 hours.  Chemistry Recent Labs  Lab 12/01/19 0105  NA 134*  K 3.9  CL 98  CO2 25  GLUCOSE 181*  BUN 25*  CREATININE 1.18  CALCIUM 9.4  PROT 7.0  ALBUMIN 3.9  AST 20  ALT 22  ALKPHOS 75  BILITOT 0.7  GFRNONAA >60  GFRAA >60  ANIONGAP 11    Hematology Recent Labs  Lab 12/01/19 0105  WBC 9.1  RBC 4.42  HGB 13.1  HCT 40.0  MCV 90.5  MCH 29.6  MCHC 32.8  RDW 14.4  PLT 121*   BNP Recent Labs  Lab 12/01/19 1608  BNP 287.1*    DDimer No results for input(s): DDIMER in the last 168 hours.   Radiology  DG Chest Portable 1 View  Result Date:  12/01/2019 CLINICAL DATA:  Chest pain. EXAM: PORTABLE CHEST 1 VIEW COMPARISON:  08/11/2017 FINDINGS: Chronic hyperinflation. Slight paucity of upper lobe lung markings suggests emphysema. No focal airspace disease. Unchanged heart size and mediastinal contours. Aortic atherosclerosis. No pneumothorax or large pleural effusion. No acute osseous abnormalities are seen. IMPRESSION: Chronic hyperinflation without acute abnormality. Aortic Atherosclerosis (ICD10-I70.0). Electronically Signed   By: Narda Rutherford M.D.   On: 12/01/2019 01:58    Cardiac Studies   Echo pending  Patient Profile  Jorge Clements is a 74 y.o. male with severe PAD (status post right femoropopliteal above-knee bypass, status post right transmetatarsal amputation), COPD, hypertension, hyperlipidemia who was admitted on 12/01/2019 with non-STEMI.  Assessment & Plan   1.  Non-STEMI -Chest pain.  Elevated troponin.  This is ACS.  EKG demonstrates old anteroseptal infarct.  No ST elevation. -On aspirin and heparin.  N.p.o. at midnight for left heart catheterization in a.m. -We will wean his nitroglycerin drip.  He was put on this at any pain.  No further chest pain.  Wean as able. -Started on metoprolol. -A1c and echocardiogram pending.  Lipid profile pending -Continue blood pressure medication  2.  Hypertension -Continue home ACE and HCTZ.  On metoprolol as above.  3.  Hyperlipidemia -Statin  4.  COPD -Stable. albuterol as needed.  5.  Severe PAD -No issues.  On aspirin/statin.  FEN -pre cath IVF tomorrow -diet: salt restricted. NPO at midnight.  -dvt ppx: heparin drip -code: full   For questions or updates, please contact [facility] Please consult [email or web address] for contact info under   Time Spent with Patient: I have spent a total of 25 minutes with patient reviewing hospital notes, telemetry, EKGs, labs and examining the patient as well as establishing an assessment and plan that was discussed with the  patient.  > 50% of time was spent in direct patient care.    Signed, Lenna Gilford. Flora Lipps, MD [facility]  [facility]  12/02/2019 9:01 AM

## 2019-12-03 ENCOUNTER — Encounter (HOSPITAL_COMMUNITY)
Admission: EM | Disposition: A | Discharge status: Home / Self Care | Payer: Self-pay | Source: Home / Self Care | Attending: Cardiovascular Disease

## 2019-12-03 DIAGNOSIS — I2510 Atherosclerotic heart disease of native coronary artery without angina pectoris: Secondary | ICD-10-CM

## 2019-12-03 DIAGNOSIS — I10 Essential (primary) hypertension: Secondary | ICD-10-CM

## 2019-12-03 HISTORY — PX: CORONARY BALLOON ANGIOPLASTY: CATH118233

## 2019-12-03 HISTORY — PX: LEFT HEART CATH AND CORONARY ANGIOGRAPHY: CATH118249

## 2019-12-03 HISTORY — PX: CORONARY STENT INTERVENTION: CATH118234

## 2019-12-03 LAB — CBC
HCT: 39.4 % (ref 39.0–52.0)
Hemoglobin: 12.9 g/dL — ABNORMAL LOW (ref 13.0–17.0)
MCH: 29.5 pg (ref 26.0–34.0)
MCHC: 32.7 g/dL (ref 30.0–36.0)
MCV: 90 fL (ref 80.0–100.0)
Platelets: 123 10*3/uL — ABNORMAL LOW (ref 150–400)
RBC: 4.38 MIL/uL (ref 4.22–5.81)
RDW: 14.6 % (ref 11.5–15.5)
WBC: 9.1 10*3/uL (ref 4.0–10.5)
nRBC: 0 % (ref 0.0–0.2)

## 2019-12-03 LAB — POCT ACTIVATED CLOTTING TIME
Activated Clotting Time: 367 seconds
Activated Clotting Time: 373 seconds

## 2019-12-03 LAB — BASIC METABOLIC PANEL
Anion gap: 12 (ref 5–15)
BUN: 24 mg/dL — ABNORMAL HIGH (ref 8–23)
CO2: 23 mmol/L (ref 22–32)
Calcium: 9.3 mg/dL (ref 8.9–10.3)
Chloride: 99 mmol/L (ref 98–111)
Creatinine, Ser: 1.21 mg/dL (ref 0.61–1.24)
GFR calc Af Amer: >60 mL/min (ref >60)
GFR calc non Af Amer: 59 mL/min — ABNORMAL LOW (ref >60)
Glucose, Bld: 116 mg/dL — ABNORMAL HIGH (ref 70–99)
Potassium: 4.3 mmol/L (ref 3.5–5.1)
Sodium: 134 mmol/L — ABNORMAL LOW (ref 135–145)

## 2019-12-03 LAB — HEPARIN LEVEL (UNFRACTIONATED): Heparin Unfractionated: 0.42 IU/mL (ref 0.30–0.70)

## 2019-12-03 SURGERY — LEFT HEART CATH AND CORONARY ANGIOGRAPHY
Anesthesia: LOCAL

## 2019-12-03 MED ORDER — LIDOCAINE HCL (PF) 1 % IJ SOLN
INTRAMUSCULAR | Status: AC
  Filled 2019-12-03: qty 30

## 2019-12-03 MED ORDER — ACETAMINOPHEN 325 MG PO TABS: 650.0000 mg | ORAL_TABLET | ORAL | Status: DC | PRN

## 2019-12-03 MED ORDER — TIROFIBAN (AGGRASTAT) BOLUS VIA INFUSION
INTRAVENOUS | Status: DC | PRN
  Administered 2019-12-03: 3140 ug via INTRAVENOUS

## 2019-12-03 MED ORDER — HEPARIN SODIUM (PORCINE) 1000 UNIT/ML IJ SOLN
INTRAMUSCULAR | Status: DC | PRN
  Administered 2019-12-03: 9000 [IU] via INTRAVENOUS
  Administered 2019-12-03: 5000 [IU] via INTRAVENOUS

## 2019-12-03 MED ORDER — SODIUM CHLORIDE 0.9% FLUSH: 3.0000 mL | Freq: Two times a day (BID) | INTRAVENOUS | Status: DC

## 2019-12-03 MED ORDER — TIROFIBAN HCL IN NACL 5-0.9 MG/100ML-% IV SOLN
INTRAVENOUS | Status: AC
  Filled 2019-12-03: qty 100

## 2019-12-03 MED ORDER — LABETALOL HCL 5 MG/ML IV SOLN: 10.0000 mg | INTRAVENOUS | Status: AC | PRN

## 2019-12-03 MED ORDER — NITROGLYCERIN 1 MG/10 ML FOR IR/CATH LAB
INTRA_ARTERIAL | Status: AC
  Filled 2019-12-03: qty 10

## 2019-12-03 MED ORDER — HYDROCHLOROTHIAZIDE 12.5 MG PO CAPS
25.0000 mg | ORAL_CAPSULE | Freq: Every day | ORAL | Status: DC
  Administered 2019-12-04: 25 mg via ORAL
  Filled 2019-12-03: qty 2

## 2019-12-03 MED ORDER — IOHEXOL 350 MG/ML SOLN
INTRAVENOUS | Status: DC | PRN
  Administered 2019-12-03: 205 mL

## 2019-12-03 MED ORDER — FENTANYL CITRATE (PF) 100 MCG/2ML IJ SOLN
INTRAMUSCULAR | Status: AC
  Filled 2019-12-03: qty 2

## 2019-12-03 MED ORDER — TICAGRELOR 90 MG PO TABS
90.0000 mg | ORAL_TABLET | Freq: Two times a day (BID) | ORAL | Status: DC
  Administered 2019-12-03 – 2019-12-04 (×2): 90 mg via ORAL
  Filled 2019-12-03 (×2): qty 1

## 2019-12-03 MED ORDER — FENTANYL CITRATE (PF) 100 MCG/2ML IJ SOLN
INTRAMUSCULAR | Status: DC | PRN
  Administered 2019-12-03 (×2): 25 ug via INTRAVENOUS

## 2019-12-03 MED ORDER — SODIUM CHLORIDE 0.9% FLUSH: 3.0000 mL | INTRAVENOUS | Status: DC | PRN

## 2019-12-03 MED ORDER — NITROGLYCERIN 1 MG/10 ML FOR IR/CATH LAB
INTRA_ARTERIAL | Status: DC | PRN
  Administered 2019-12-03 (×2): 200 ug via INTRACORONARY

## 2019-12-03 MED ORDER — SODIUM CHLORIDE 0.9 % IV SOLN: 250.0000 mL | INTRAVENOUS | Status: DC | PRN

## 2019-12-03 MED ORDER — HYDRALAZINE HCL 20 MG/ML IJ SOLN: 10.0000 mg | INTRAMUSCULAR | Status: AC | PRN

## 2019-12-03 MED ORDER — ASPIRIN 81 MG PO CHEW: 81.0000 mg | CHEWABLE_TABLET | Freq: Every day | ORAL | Status: DC

## 2019-12-03 MED ORDER — TICAGRELOR 90 MG PO TABS
ORAL_TABLET | ORAL | Status: AC
  Filled 2019-12-03: qty 2

## 2019-12-03 MED ORDER — TIROFIBAN HCL IN NACL 5-0.9 MG/100ML-% IV SOLN: INTRAVENOUS | Status: DC | PRN

## 2019-12-03 MED ORDER — HEPARIN (PORCINE) IN NACL 1000-0.9 UT/500ML-% IV SOLN
INTRAVENOUS | Status: AC
  Filled 2019-12-03: qty 1000

## 2019-12-03 MED ORDER — MIDAZOLAM HCL 2 MG/2ML IJ SOLN
INTRAMUSCULAR | Status: AC
  Filled 2019-12-03: qty 2

## 2019-12-03 MED ORDER — HEPARIN SODIUM (PORCINE) 1000 UNIT/ML IJ SOLN
INTRAMUSCULAR | Status: AC
  Filled 2019-12-03: qty 1

## 2019-12-03 MED ORDER — VERAPAMIL HCL 2.5 MG/ML IV SOLN
INTRAVENOUS | Status: AC
  Filled 2019-12-03: qty 2

## 2019-12-03 MED ORDER — LIDOCAINE HCL (PF) 1 % IJ SOLN
INTRAMUSCULAR | Status: DC | PRN
  Administered 2019-12-03: 2 mL

## 2019-12-03 MED ORDER — MELATONIN 3 MG PO TABS
3.0000 mg | ORAL_TABLET | Freq: Every evening | ORAL | Status: DC | PRN
  Administered 2019-12-04: 3 mg via ORAL
  Filled 2019-12-03: qty 1

## 2019-12-03 MED ORDER — ONDANSETRON HCL 4 MG/2ML IJ SOLN: 4.0000 mg | Freq: Four times a day (QID) | INTRAMUSCULAR | Status: DC | PRN

## 2019-12-03 MED ORDER — TICAGRELOR 90 MG PO TABS
ORAL_TABLET | ORAL | Status: DC | PRN
  Administered 2019-12-03: 180 mg via ORAL

## 2019-12-03 MED ORDER — SODIUM CHLORIDE 0.9 % IV SOLN: INTRAVENOUS | Status: AC

## 2019-12-03 MED ORDER — MIDAZOLAM HCL 2 MG/2ML IJ SOLN
INTRAMUSCULAR | Status: DC | PRN
  Administered 2019-12-03: 1 mg via INTRAVENOUS
  Administered 2019-12-03: 2 mg via INTRAVENOUS

## 2019-12-03 MED ORDER — VERAPAMIL HCL 2.5 MG/ML IV SOLN
INTRAVENOUS | Status: DC | PRN
  Administered 2019-12-03: 10 mL via INTRA_ARTERIAL

## 2019-12-03 MED ORDER — HEPARIN (PORCINE) IN NACL 1000-0.9 UT/500ML-% IV SOLN
INTRAVENOUS | Status: DC | PRN
  Administered 2019-12-03 (×2): 500 mL

## 2019-12-03 SURGICAL SUPPLY — 23 items
BALLN EMERGE MR 2.0X15 (BALLOONS) ×2
BALLN SAPPHIRE 2.25X15 (BALLOONS) ×2
BALLN SAPPHIRE ~~LOC~~ 2.75X15 (BALLOONS) ×1 IMPLANT
BALLN SAPPHIRE ~~LOC~~ 2.75X18 (BALLOONS) ×1 IMPLANT
BALLOON EMERGE MR 2.0X15 (BALLOONS) IMPLANT
BALLOON SAPPHIRE 2.25X15 (BALLOONS) IMPLANT
CATH 5FR JL3.5 JR4 ANG PIG MP (CATHETERS) ×1 IMPLANT
CATH EXTRAC PRONTO LP 6F RND (CATHETERS) ×1 IMPLANT
CATH LAUNCHER 6FR JR4 (CATHETERS) ×1 IMPLANT
DEVICE RAD COMP TR BAND LRG (VASCULAR PRODUCTS) ×1 IMPLANT
GLIDESHEATH SLEND SS 6F .021 (SHEATH) ×1 IMPLANT
GUIDEWIRE INQWIRE 1.5J.035X260 (WIRE) IMPLANT
INQWIRE 1.5J .035X260CM (WIRE) ×2
KIT ENCORE 26 ADVANTAGE (KITS) ×2 IMPLANT
KIT HEART LEFT (KITS) ×2 IMPLANT
KIT HEMO VALVE WATCHDOG (MISCELLANEOUS) ×1 IMPLANT
PACK CARDIAC CATHETERIZATION (CUSTOM PROCEDURE TRAY) ×2 IMPLANT
STENT RESOLUTE ONYX 2.5X30 (Permanent Stent) ×1 IMPLANT
TRANSDUCER W/STOPCOCK (MISCELLANEOUS) ×2 IMPLANT
TUBING CIL FLEX 10 FLL-RA (TUBING) ×2 IMPLANT
WIRE ASAHI PROWATER 180CM (WIRE) ×1 IMPLANT
WIRE FIGHTER CROSSING 190CM (WIRE) ×1 IMPLANT
WIRE HI TORQ BMW 190CM (WIRE) ×1 IMPLANT

## 2019-12-03 NOTE — H&P (View-Only) (Signed)
Progress Note  Patient Name: Jorge Clements Date of Encounter: 12/03/2019  [facility] Cardiologist: Reatha Harps, MD   Subjective   Plan for cardiac cath this morning. Weaned IV nitro overnight. No recurrent chest pain.   Inpatient Medications    Scheduled Meds: . aspirin  81 mg Oral Daily  . enalapril  10 mg Oral Daily  . hydrochlorothiazide  12.5 mg Oral Daily  . metoprolol tartrate  25 mg Oral BID  . mometasone-formoterol  2 puff Inhalation BID  . rosuvastatin  40 mg Oral Daily   Continuous Infusions: . sodium chloride 1 mL/kg/hr (12/03/19 0603)  . heparin 1,600 Units/hr (12/02/19 1550)   PRN Meds: acetaminophen, albuterol, nitroGLYCERIN, ondansetron (ZOFRAN) IV, traZODone   Vital Signs    Vitals:   12/02/19 2035 12/02/19 2054 12/02/19 2242 12/03/19 0453  BP: (!) 131/51  137/72 (!) 161/53  Pulse: (!) 58  73 68  Resp: 18   20  Temp: 98.2 F (36.8 C)   98.4 F (36.9 C)  TempSrc: Oral   Oral  SpO2: 97% 97%  98%  Weight:    125.6 kg  Height:        Intake/Output Summary (Last 24 hours) at 12/03/2019 0642 Last data filed at 12/03/2019 0603 Gross per 24 hour  Intake 964.76 ml  Output 650 ml  Net 314.76 ml   Last 3 Weights 12/03/2019 12/02/2019 12/01/2019  Weight (lbs) 277 lb 258 lb 10.3 oz 260 lb  Weight (kg) 125.646 kg 117.32 kg 117.935 kg      Telemetry    NSR, HR 60s with brief 1st degree AV block - Personally Reviewed  ECG    NSR, 1st degree AV block, anteroseptal q waves, 65bpm - Personally Reviewed  Physical Exam   GEN: No acute distress.   Neck: No JVD Cardiac: RRR, + murmur,no rubs, or gallops.  Respiratory: diminished breath sounds bilaterally. GI: Soft, nontender, non-distended  MS: No edema; No deformity. Neuro:  Nonfocal  Psych: Normal affect   Labs    High Sensitivity Troponin:   Recent Labs  Lab 12/01/19 0105 12/01/19 0319 12/01/19 1608 12/01/19 1819  TROPONINIHS 21* 147* 188* 162*      Chemistry Recent Labs  Lab  12/01/19 0105 12/02/19 0926 12/03/19 0303  NA 134* 134* 134*  K 3.9 4.6 4.3  CL 98 99 99  CO2 25 23 23   GLUCOSE 181* 118* 116*  BUN 25* 20 24*  CREATININE 1.18 1.17 1.21  CALCIUM 9.4 9.5 9.3  PROT 7.0  --   --   ALBUMIN 3.9  --   --   AST 20  --   --   ALT 22  --   --   ALKPHOS 75  --   --   BILITOT 0.7  --   --   GFRNONAA >60 >60 59*  GFRAA >60 >60 >60  ANIONGAP 11 12 12      Hematology Recent Labs  Lab 12/01/19 0105 12/02/19 0926 12/03/19 0303  WBC 9.1 8.4 9.1  RBC 4.42 4.58 4.38  HGB 13.1 13.4 12.9*  HCT 40.0 41.1 39.4  MCV 90.5 89.7 90.0  MCH 29.6 29.3 29.5  MCHC 32.8 32.6 32.7  RDW 14.4 14.5 14.6  PLT 121* 125* 123*    BNP Recent Labs  Lab 12/01/19 1608  BNP 287.1*     DDimer No results for input(s): DDIMER in the last 168 hours.   Radiology    ECHOCARDIOGRAM COMPLETE  Result Date: 12/02/2019  ECHOCARDIOGRAM REPORT   Patient Name:   Jorge Clements Date of Exam: 12/02/2019 Medical Rec #:  [id number]      Height:       72.0 in Accession #:    [id number]     Weight:       258.6 lb Date of Birth:  [birth year]       BSA:          2.377 m Patient Age:    74 years       BP:           130/48 mmHg Patient Gender: M              HR:           60 bpm. Exam Location:  Inpatient Procedure: 2D Echo, Cardiac Doppler, Color Doppler and Intracardiac            Opacification Agent Indications:    NSTEMI I21.4  History:        Patient has no prior history of Echocardiogram examinations.                 COPD; Risk Factors:Hypertension, Dyslipidemia and Former Smoker.                 AAA. PAD.  Sonographer:    Renella Cunas RDCS Referring Phys: [id number] Evern Bio WEAVER IMPRESSIONS  1. Left ventricular ejection fraction, by estimation, is 65 to 70%. The left ventricle has normal function. The left ventricle has no regional wall motion abnormalities. Left ventricular diastolic parameters were normal.  2. Right ventricular systolic function is normal. The right ventricular size is normal.  There is normal pulmonary artery systolic pressure.  3. The mitral valve is normal in structure. Trivial mitral valve regurgitation. No evidence of mitral stenosis.  4. The aortic valve is grossly normal. Aortic valve regurgitation is not visualized. No aortic stenosis is present.  5. The inferior vena cava is normal in size with greater than 50% respiratory variability, suggesting right atrial pressure of 3 mmHg. Comparison(s): No prior Echocardiogram. Conclusion(s)/Recommendation(s): Normal biventricular function without evidence of hemodynamically significant valvular heart disease. FINDINGS  Left Ventricle: Left ventricular ejection fraction, by estimation, is 65 to 70%. The left ventricle has normal function. The left ventricle has no regional wall motion abnormalities. Definity contrast agent was given IV to delineate the left ventricular  endocardial borders. The left ventricular internal cavity size was normal in size. There is no left ventricular hypertrophy. Left ventricular diastolic parameters were normal. Right Ventricle: The right ventricular size is normal. No increase in right ventricular wall thickness. Right ventricular systolic function is normal. There is normal pulmonary artery systolic pressure. The tricuspid regurgitant velocity is 1.56 m/s, and  with an assumed right atrial pressure of 3 mmHg, the estimated right ventricular systolic pressure is 12.7 mmHg. Left Atrium: Left atrial size was normal in size. Right Atrium: Right atrial size was normal in size. Pericardium: There is no evidence of pericardial effusion. Mitral Valve: The mitral valve is normal in structure. Trivial mitral valve regurgitation. No evidence of mitral valve stenosis. Tricuspid Valve: The tricuspid valve is normal in structure. Tricuspid valve regurgitation is trivial. No evidence of tricuspid stenosis. Aortic Valve: The aortic valve is grossly normal. Aortic valve regurgitation is not visualized. No aortic stenosis is  present. Pulmonic Valve: The pulmonic valve was not well visualized. Pulmonic valve regurgitation is not visualized. Aorta: The aortic root, ascending aorta and aortic arch are all structurally normal, with no  evidence of dilitation or obstruction. Venous: The inferior vena cava is normal in size with greater than 50% respiratory variability, suggesting right atrial pressure of 3 mmHg. IAS/Shunts: No atrial level shunt detected by color flow Doppler.  LEFT VENTRICLE PLAX 2D LVIDd:         5.00 cm  Diastology LVIDs:         3.10 cm  LV e' lateral:   8.59 cm/s LV PW:         0.80 cm  LV E/e' lateral: 11.8 LV IVS:        0.80 cm  LV e' medial:    7.51 cm/s LVOT diam:     2.50 cm  LV E/e' medial:  13.4 LV SV:         165 LV SV Index:   69 LVOT Area:     4.91 cm  RIGHT VENTRICLE RV S prime:     12.30 cm/s TAPSE (M-mode): 1.9 cm LEFT ATRIUM           Index       RIGHT ATRIUM           Index LA diam:      3.40 cm 1.43 cm/m  RA Area:     11.90 cm LA Vol (A2C): 37.4 ml 15.74 ml/m RA Volume:   23.90 ml  10.06 ml/m LA Vol (A4C): 43.2 ml 18.18 ml/m  AORTIC VALVE LVOT Vmax:   138.00 cm/s LVOT Vmean:  99.800 cm/s LVOT VTI:    0.336 m  AORTA Ao Root diam: 3.50 cm MITRAL VALVE                TRICUSPID VALVE MV Area (PHT): 3.39 cm     TR Peak grad:   9.7 mmHg MV Decel Time: 224 msec     TR Vmax:        156.00 cm/s MR Peak grad: 14.6 mmHg MR Vmax:      191.00 cm/s   SHUNTS MV E velocity: 101.00 cm/s  Systemic VTI:  0.34 m MV A velocity: 80.60 cm/s   Systemic Diam: 2.50 cm MV E/A ratio:  1.25 Jodelle Red MD Electronically signed by Jodelle Red MD Signature Date/Time: 12/02/2019/4:19:01 PM    Final     Cardiac Studies   Echo 12/02/19   ECHOCARDIOGRAM REPORT       Patient Name:  MONTEL VANDERHOOF Date of Exam: 12/02/2019  Medical Rec #: [id number]   Height:    72.0 in  Accession #:  [id number]   Weight:    258.6 lb  Date of Birth: 06/01/1945    BSA:     2.377 m  Patient Age:   74 years    BP:      130/48 mmHg  Patient Gender: M       HR:      60 bpm.  Exam Location: Inpatient   Procedure: 2D Echo, Cardiac Doppler, Color Doppler and Intracardiac       Opacification Agent   Indications:  NSTEMI I21.4    History:    Patient has no prior history of Echocardiogram  examinations.         COPD; Risk Factors:Hypertension, Dyslipidemia and Former  Smoker.         AAA. PAD.    Sonographer:  Renella Cunas RDCS  Referring Phys: [id number] Evern Bio WEAVER   IMPRESSIONS    1. Left ventricular ejection fraction, by estimation, is 65 to 70%. The  left ventricle has normal  function. The left ventricle has no regional  wall motion abnormalities. Left ventricular diastolic parameters were  normal.  2. Right ventricular systolic function is normal. The right ventricular  size is normal. There is normal pulmonary artery systolic pressure.  3. The mitral valve is normal in structure. Trivial mitral valve  regurgitation. No evidence of mitral stenosis.  4. The aortic valve is grossly normal. Aortic valve regurgitation is not  visualized. No aortic stenosis is present.  5. The inferior vena cava is normal in size with greater than 50%  respiratory variability, suggesting right atrial pressure of 3 mmHg.   Patient Profile     74 y.o. male  with severe PAD (status post right femoropopliteal above-knee bypass, status post right transmetatarsal amputation), COPD, hypertension, hyperlipidemia who was admitted on 12/01/2019 with non-STEMI.  Assessment & Plan    NSTEMI - presented with chest pain. Troponin elevated to 188. EKG with no new changes - IV heparin - ASA, crestor.  - continue metoprolol - Weaned off IV nitro drip. No recurrent chest pain - Echo showed LVEF 65-7-%, no wall motion abnormalities - A1C 6.4 - cardiac catheterization today  HTN  - continue home enalapril 10 mg daily and HCTZ 12.5mg daily -  metoprolol 25mg BID added - pressures reasonable  HLD - Crestor 40 mg daily - lipid panel: total 103, HDL 38, LDL39, TG 130  COPD - no acute issues  Severe PAD - aspirin and statin    For questions or updates, please contact [facility] Please consult [email or web address] for contact info under        Signed, Izea Livolsi H Revanth Neidig, PA-C  12/03/2019, 6:42 AM   

## 2019-12-03 NOTE — Progress Notes (Signed)
Progress Note  Patient Name: Jorge Clements Date of Encounter: 12/03/2019  [facility] Cardiologist: Reatha Harps, MD   Subjective   Plan for cardiac cath this morning. Weaned IV nitro overnight. No recurrent chest pain.   Inpatient Medications    Scheduled Meds: . aspirin  81 mg Oral Daily  . enalapril  10 mg Oral Daily  . hydrochlorothiazide  12.5 mg Oral Daily  . metoprolol tartrate  25 mg Oral BID  . mometasone-formoterol  2 puff Inhalation BID  . rosuvastatin  40 mg Oral Daily   Continuous Infusions: . sodium chloride 1 mL/kg/hr (12/03/19 0603)  . heparin 1,600 Units/hr (12/02/19 1550)   PRN Meds: acetaminophen, albuterol, nitroGLYCERIN, ondansetron (ZOFRAN) IV, traZODone   Vital Signs    Vitals:   12/02/19 2035 12/02/19 2054 12/02/19 2242 12/03/19 0453  BP: (!) 131/51  137/72 (!) 161/53  Pulse: (!) 58  73 68  Resp: 18   20  Temp: 98.2 F (36.8 C)   98.4 F (36.9 C)  TempSrc: Oral   Oral  SpO2: 97% 97%  98%  Weight:    125.6 kg  Height:        Intake/Output Summary (Last 24 hours) at 12/03/2019 0642 Last data filed at 12/03/2019 0603 Gross per 24 hour  Intake 964.76 ml  Output 650 ml  Net 314.76 ml   Last 3 Weights 12/03/2019 12/02/2019 12/01/2019  Weight (lbs) 277 lb 258 lb 10.3 oz 260 lb  Weight (kg) 125.646 kg 117.32 kg 117.935 kg      Telemetry    NSR, HR 60s with brief 1st degree AV block - Personally Reviewed  ECG    NSR, 1st degree AV block, anteroseptal q waves, 65bpm - Personally Reviewed  Physical Exam   GEN: No acute distress.   Neck: No JVD Cardiac: RRR, + murmur,no rubs, or gallops.  Respiratory: diminished breath sounds bilaterally. GI: Soft, nontender, non-distended  MS: No edema; No deformity. Neuro:  Nonfocal  Psych: Normal affect   Labs    High Sensitivity Troponin:   Recent Labs  Lab 12/01/19 0105 12/01/19 0319 12/01/19 1608 12/01/19 1819  TROPONINIHS 21* 147* 188* 162*      Chemistry Recent Labs  Lab  12/01/19 0105 12/02/19 0926 12/03/19 0303  NA 134* 134* 134*  K 3.9 4.6 4.3  CL 98 99 99  CO2 25 23 23   GLUCOSE 181* 118* 116*  BUN 25* 20 24*  CREATININE 1.18 1.17 1.21  CALCIUM 9.4 9.5 9.3  PROT 7.0  --   --   ALBUMIN 3.9  --   --   AST 20  --   --   ALT 22  --   --   ALKPHOS 75  --   --   BILITOT 0.7  --   --   GFRNONAA >60 >60 59*  GFRAA >60 >60 >60  ANIONGAP 11 12 12      Hematology Recent Labs  Lab 12/01/19 0105 12/02/19 0926 12/03/19 0303  WBC 9.1 8.4 9.1  RBC 4.42 4.58 4.38  HGB 13.1 13.4 12.9*  HCT 40.0 41.1 39.4  MCV 90.5 89.7 90.0  MCH 29.6 29.3 29.5  MCHC 32.8 32.6 32.7  RDW 14.4 14.5 14.6  PLT 121* 125* 123*    BNP Recent Labs  Lab 12/01/19 1608  BNP 287.1*     DDimer No results for input(s): DDIMER in the last 168 hours.   Radiology    ECHOCARDIOGRAM COMPLETE  Result Date: 12/02/2019  ECHOCARDIOGRAM REPORT   Patient Name:   Jorge Clements Date of Exam: 12/02/2019 Medical Rec #:  [id number]      Height:       72.0 in Accession #:    [id number]     Weight:       258.6 lb Date of Birth:  [birth year]       BSA:          2.377 m Patient Age:    74 years       BP:           130/48 mmHg Patient Gender: M              HR:           60 bpm. Exam Location:  Inpatient Procedure: 2D Echo, Cardiac Doppler, Color Doppler and Intracardiac            Opacification Agent Indications:    NSTEMI I21.4  History:        Patient has no prior history of Echocardiogram examinations.                 COPD; Risk Factors:Hypertension, Dyslipidemia and Former Smoker.                 AAA. PAD.  Sonographer:    Renella Cunas RDCS Referring Phys: [id number] Evern Bio WEAVER IMPRESSIONS  1. Left ventricular ejection fraction, by estimation, is 65 to 70%. The left ventricle has normal function. The left ventricle has no regional wall motion abnormalities. Left ventricular diastolic parameters were normal.  2. Right ventricular systolic function is normal. The right ventricular size is normal.  There is normal pulmonary artery systolic pressure.  3. The mitral valve is normal in structure. Trivial mitral valve regurgitation. No evidence of mitral stenosis.  4. The aortic valve is grossly normal. Aortic valve regurgitation is not visualized. No aortic stenosis is present.  5. The inferior vena cava is normal in size with greater than 50% respiratory variability, suggesting right atrial pressure of 3 mmHg. Comparison(s): No prior Echocardiogram. Conclusion(s)/Recommendation(s): Normal biventricular function without evidence of hemodynamically significant valvular heart disease. FINDINGS  Left Ventricle: Left ventricular ejection fraction, by estimation, is 65 to 70%. The left ventricle has normal function. The left ventricle has no regional wall motion abnormalities. Definity contrast agent was given IV to delineate the left ventricular  endocardial borders. The left ventricular internal cavity size was normal in size. There is no left ventricular hypertrophy. Left ventricular diastolic parameters were normal. Right Ventricle: The right ventricular size is normal. No increase in right ventricular wall thickness. Right ventricular systolic function is normal. There is normal pulmonary artery systolic pressure. The tricuspid regurgitant velocity is 1.56 m/s, and  with an assumed right atrial pressure of 3 mmHg, the estimated right ventricular systolic pressure is 12.7 mmHg. Left Atrium: Left atrial size was normal in size. Right Atrium: Right atrial size was normal in size. Pericardium: There is no evidence of pericardial effusion. Mitral Valve: The mitral valve is normal in structure. Trivial mitral valve regurgitation. No evidence of mitral valve stenosis. Tricuspid Valve: The tricuspid valve is normal in structure. Tricuspid valve regurgitation is trivial. No evidence of tricuspid stenosis. Aortic Valve: The aortic valve is grossly normal. Aortic valve regurgitation is not visualized. No aortic stenosis is  present. Pulmonic Valve: The pulmonic valve was not well visualized. Pulmonic valve regurgitation is not visualized. Aorta: The aortic root, ascending aorta and aortic arch are all structurally normal, with no  evidence of dilitation or obstruction. Venous: The inferior vena cava is normal in size with greater than 50% respiratory variability, suggesting right atrial pressure of 3 mmHg. IAS/Shunts: No atrial level shunt detected by color flow Doppler.  LEFT VENTRICLE PLAX 2D LVIDd:         5.00 cm  Diastology LVIDs:         3.10 cm  LV e' lateral:   8.59 cm/s LV PW:         0.80 cm  LV E/e' lateral: 11.8 LV IVS:        0.80 cm  LV e' medial:    7.51 cm/s LVOT diam:     2.50 cm  LV E/e' medial:  13.4 LV SV:         165 LV SV Index:   69 LVOT Area:     4.91 cm  RIGHT VENTRICLE RV S prime:     12.30 cm/s TAPSE (M-mode): 1.9 cm LEFT ATRIUM           Index       RIGHT ATRIUM           Index LA diam:      3.40 cm 1.43 cm/m  RA Area:     11.90 cm LA Vol (A2C): 37.4 ml 15.74 ml/m RA Volume:   23.90 ml  10.06 ml/m LA Vol (A4C): 43.2 ml 18.18 ml/m  AORTIC VALVE LVOT Vmax:   138.00 cm/s LVOT Vmean:  99.800 cm/s LVOT VTI:    0.336 m  AORTA Ao Root diam: 3.50 cm MITRAL VALVE                TRICUSPID VALVE MV Area (PHT): 3.39 cm     TR Peak grad:   9.7 mmHg MV Decel Time: 224 msec     TR Vmax:        156.00 cm/s MR Peak grad: 14.6 mmHg MR Vmax:      191.00 cm/s   SHUNTS MV E velocity: 101.00 cm/s  Systemic VTI:  0.34 m MV A velocity: 80.60 cm/s   Systemic Diam: 2.50 cm MV E/A ratio:  1.25 Jodelle Red MD Electronically signed by Jodelle Red MD Signature Date/Time: 12/02/2019/4:19:01 PM    Final     Cardiac Studies   Echo 12/02/19   ECHOCARDIOGRAM REPORT       Patient Name:  MONTEL VANDERHOOF Date of Exam: 12/02/2019  Medical Rec #: [id number]   Height:    72.0 in  Accession #:  [id number]   Weight:    258.6 lb  Date of Birth: 06/01/1945    BSA:     2.377 m  Patient Age:   74 years    BP:      130/48 mmHg  Patient Gender: M       HR:      60 bpm.  Exam Location: Inpatient   Procedure: 2D Echo, Cardiac Doppler, Color Doppler and Intracardiac       Opacification Agent   Indications:  NSTEMI I21.4    History:    Patient has no prior history of Echocardiogram  examinations.         COPD; Risk Factors:Hypertension, Dyslipidemia and Former  Smoker.         AAA. PAD.    Sonographer:  Renella Cunas RDCS  Referring Phys: [id number] Evern Bio WEAVER   IMPRESSIONS    1. Left ventricular ejection fraction, by estimation, is 65 to 70%. The  left ventricle has normal  function. The left ventricle has no regional  wall motion abnormalities. Left ventricular diastolic parameters were  normal.  2. Right ventricular systolic function is normal. The right ventricular  size is normal. There is normal pulmonary artery systolic pressure.  3. The mitral valve is normal in structure. Trivial mitral valve  regurgitation. No evidence of mitral stenosis.  4. The aortic valve is grossly normal. Aortic valve regurgitation is not  visualized. No aortic stenosis is present.  5. The inferior vena cava is normal in size with greater than 50%  respiratory variability, suggesting right atrial pressure of 3 mmHg.   Patient Profile     74 y.o. male  with severe PAD (status post right femoropopliteal above-knee bypass, status post right transmetatarsal amputation), COPD, hypertension, hyperlipidemia who was admitted on 12/01/2019 with non-STEMI.  Assessment & Plan    NSTEMI - presented with chest pain. Troponin elevated to 188. EKG with no new changes - IV heparin - ASA, crestor.  - continue metoprolol - Weaned off IV nitro drip. No recurrent chest pain - Echo showed LVEF 65-7-%, no wall motion abnormalities - A1C 6.4 - cardiac catheterization today  HTN  - continue home enalapril 10 mg daily and HCTZ 12.5mg  daily -  metoprolol 25mg  BID added - pressures reasonable  HLD - Crestor 40 mg daily - lipid panel: total 103, HDL 38, LDL39, TG 130  COPD - no acute issues  Severe PAD - aspirin and statin    For questions or updates, please contact [facility] Please consult [email or web address] for contact info under        Signed, Neville Walston David StallH Lakin Romer, PA-C  12/03/2019, 6:42 AM

## 2019-12-03 NOTE — Progress Notes (Signed)
ANTICOAGULATION CONSULT NOTE  Pharmacy Consult for Heparin Indication: chest pain/ACS  Allergies  Allergen Reactions   Penicillins Hives and Rash    Childhood reaction Has patient had a PCN reaction causing immediate rash, facial/tongue/throat swelling, SOB or lightheadedness with hypotension: No PT DEVELOPED ## SEVERE ## RASH INVOLVING MUCUS MEMBRANES or SKIN NECROSIS: #  #  YES  #  # Has patient had a PCN reaction that required hospitalization: Unknown Has patient had a PCN reaction occurring within the last 10 years: No   Clindamycin Rash    Diffuse rash across body    Patient Measurements: Height: 6' (182.9 cm) Weight: 125.6 kg (277 lb) IBW/kg (Calculated) : 77.6 Heparin Dosing Weight: 100 kg  Vital Signs: Temp: 98.7 F (37.1 C) (08/16 0809) Temp Source: Oral (08/16 0809) BP: 146/59 (08/16 0809) Pulse Rate: 57 (08/16 0809)  Labs: Recent Labs    12/01/19 0105 12/01/19 0105 12/01/19 0319 12/01/19 1159 12/01/19 1608 12/01/19 1819 12/01/19 2220 12/02/19 0926 12/03/19 0303  HGB 13.1   < >  --   --   --   --   --  13.4 12.9*  HCT 40.0  --   --   --   --   --   --  41.1 39.4  PLT 121*  --   --   --   --   --   --  125* 123*  HEPARINUNFRC  --   --   --    < >  --   --  0.37 0.36 0.42  CREATININE 1.18  --   --   --   --   --   --  1.17 1.21  TROPONINIHS 21*   < > 147*  --  188* 162*  --   --   --    < > = values in this interval not displayed.    Estimated Creatinine Clearance: 75.6 mL/min (by C-G formula based on SCr of 1.21 mg/dL).   Medical History: Past Medical History:  Diagnosis Date   Abdominal aortic aneurysm    no AAA on [place] in 2020   Chronic obstructive pulmonary disease    HLD (hyperlipidemia)    HTN (hypertension)    Peripheral artery disease    s/p R fem pop // s/p R transmet amp    Medications:   No current facility-administered medications on file prior to encounter.   Current Outpatient Medications on File Prior to Encounter   Medication Sig Dispense Refill   aspirin 325 MG tablet Take 325 mg by mouth daily. As a buffered tablet antiplatelet     BREZTRI AEROSPHERE 160-9-4.8 MCG/ACT AERO Inhale 2 puffs into the lungs 2 (two) times daily.     enalapril (VASOTEC) 10 MG tablet Take 10 mg by mouth daily.     hydrochlorothiazide (HYDRODIURIL) 12.5 MG tablet Take 12.5 mg by mouth every morning.     Multiple Vitamins-Minerals (CENTRUM SILVER 50+MEN PO) Take by mouth.     rosuvastatin (CRESTOR) 40 MG tablet SMARTSIG:1 Tablet(s) By Mouth Every Evening     traZODone (DESYREL) 50 MG tablet TAKE 1 TO 2 TABLETS BY MOUTH EVERY DAY AT BEDTIME AS NEEDED FOR INSOMNIA     Albuterol Sulfate 108 (90 Base) MCG/ACT AEPB Inhale 90 mcg into the lungs.      Assessment: 74 y.o. male presents as a transfer from [facility] with chest pain. The patient does not take anticoagulation PTA. Pharmacy is consulted to dose heparin for ACS. The patient is scheduled  for a left heart cath today. -heparin level at goal, CBC stable  Goal of Therapy:  Heparin level 0.3-0.7 units/ml Monitor platelets by anticoagulation protocol: Yes   Plan:  Continue heparin IV at 1600 units/hr Monitor daily heparin level and CBC Will follow plans post cath  Harland German, PharmD Clinical Pharmacist **Pharmacist phone directory can now be found on [email or web address] ([email or web address]).  Listed under [facility].

## 2019-12-03 NOTE — Interval H&P Note (Signed)
Cath Lab Visit (complete for each Cath Lab visit)  Clinical Evaluation Leading to the Procedure:   ACS: Yes.    Non-ACS:    Anginal Classification: CCS IV  Anti-ischemic medical therapy: Minimal Therapy (1 class of medications)  Non-Invasive Test Results: No non-invasive testing performed  Prior CABG: No previous CABG      History and Physical Interval Note:  12/03/2019 10:28 AM  Jorge Clements  has presented today for surgery, with the diagnosis of NSTEMI.  The various methods of treatment have been discussed with the patient and family. After consideration of risks, benefits and other options for treatment, the patient has consented to  Procedure(s): LEFT HEART CATH AND CORONARY ANGIOGRAPHY (N/A) as a surgical intervention.  The patient's history has been reviewed, patient examined, no change in status, stable for surgery.  I have reviewed the patient's chart and labs.  Questions were answered to the patient's satisfaction.     Lance Muss

## 2019-12-04 ENCOUNTER — Encounter (HOSPITAL_COMMUNITY): Payer: Self-pay | Admitting: Interventional Cardiology

## 2019-12-04 LAB — CBC
HCT: 38.9 % — ABNORMAL LOW (ref 39.0–52.0)
Hemoglobin: 12.6 g/dL — ABNORMAL LOW (ref 13.0–17.0)
MCH: 29.2 pg (ref 26.0–34.0)
MCHC: 32.4 g/dL (ref 30.0–36.0)
MCV: 90 fL (ref 80.0–100.0)
Platelets: 125 10*3/uL — ABNORMAL LOW (ref 150–400)
RBC: 4.32 MIL/uL (ref 4.22–5.81)
RDW: 14.6 % (ref 11.5–15.5)
WBC: 8.3 10*3/uL (ref 4.0–10.5)
nRBC: 0 % (ref 0.0–0.2)

## 2019-12-04 LAB — BASIC METABOLIC PANEL
Anion gap: 9 (ref 5–15)
BUN: 19 mg/dL (ref 8–23)
CO2: 23 mmol/L (ref 22–32)
Calcium: 9 mg/dL (ref 8.9–10.3)
Chloride: 103 mmol/L (ref 98–111)
Creatinine, Ser: 1.16 mg/dL (ref 0.61–1.24)
GFR calc Af Amer: >60 mL/min (ref >60)
GFR calc non Af Amer: >60 mL/min (ref >60)
Glucose, Bld: 130 mg/dL — ABNORMAL HIGH (ref 70–99)
Potassium: 4.3 mmol/L (ref 3.5–5.1)
Sodium: 135 mmol/L (ref 135–145)

## 2019-12-04 MED ORDER — TICAGRELOR 90 MG PO TABS: 90.0000 mg | ORAL_TABLET | Freq: Two times a day (BID) | ORAL | 3 refills | Status: DC

## 2019-12-04 MED ORDER — ASPIRIN 81 MG PO CHEW: 81.0000 mg | CHEWABLE_TABLET | Freq: Every day | ORAL | Status: DC

## 2019-12-04 MED ORDER — HYDROCHLOROTHIAZIDE 12.5 MG PO CAPS: 25.0000 mg | ORAL_CAPSULE | Freq: Every day | ORAL | 3 refills | Status: DC

## 2019-12-04 MED ORDER — LOSARTAN POTASSIUM 25 MG PO TABS
25.0000 mg | ORAL_TABLET | Freq: Every day | ORAL | 11 refills | Status: DC
Start: 2019-12-05 — End: 2019-12-13

## 2019-12-04 MED ORDER — METFORMIN HCL 500 MG PO TABS: 500.0000 mg | ORAL_TABLET | Freq: Two times a day (BID) | ORAL | Status: DC

## 2019-12-04 MED ORDER — ROSUVASTATIN CALCIUM 40 MG PO TABS: 40.0000 mg | ORAL_TABLET | Freq: Every day | ORAL | 3 refills | Status: DC

## 2019-12-04 MED ORDER — METOPROLOL TARTRATE 25 MG PO TABS: 25.0000 mg | ORAL_TABLET | Freq: Two times a day (BID) | ORAL | 3 refills | Status: DC

## 2019-12-04 MED ORDER — METFORMIN HCL 500 MG PO TABS: 500.0000 mg | ORAL_TABLET | Freq: Two times a day (BID) | ORAL | 3 refills | Status: DC

## 2019-12-04 MED FILL — METOPROLOL TARTRATE 25 MG T: 25 | 60 days supply | Qty: 120 | Fill #0

## 2019-12-04 MED FILL — HYDROCHLOROTHIAZIDE 12.5 MG: 12.5 | 30 days supply | Qty: 60 | Fill #0

## 2019-12-04 MED FILL — metFORMIN HCL 500 MG TABS: 500 | 60 days supply | Qty: 120 | Fill #0

## 2019-12-04 MED FILL — ROSUVASTATIN CALCIUM 40 MG: 40 | 60 days supply | Qty: 60 | Fill #0

## 2019-12-04 MED FILL — BRILINTA 90 MG TABLET: 90 | 30 days supply | Qty: 60 | Fill #0

## 2019-12-04 MED FILL — LOSARTAN POTASSIUM 25 MG TA: 25 | 30 days supply | Qty: 30 | Fill #0

## 2019-12-04 MED FILL — Tirofiban HCl in NaCl 0.9% IV Soln 5 MG/100ML (Base Equiv): INTRAVENOUS | Qty: 100 | Status: AC

## 2019-12-04 NOTE — Progress Notes (Signed)
CARDIAC REHAB PHASE I   PRE:  Rate/Rhythm: 66 SR 1HB  BP:  Supine:   Sitting: 156/74  Standing:    SaO2: 97%RA  MODE:  Ambulation: 75 ft   POST:  Rate/Rhythm: 78 SR 1HB  BP:  Supine:   Sitting: 220/72,  196/69  Standing:    SaO2: 97%RA [id number] Pt walked 75 ft with his cane and minimal assistance. No CP but SOB by end of walk. Encouraged pursed lip breathing. Sats good on RA. Pt stated he has COPD. Also walks very little due to mobility issues. Did not give ex ed due to these issues. Did refer to [place] CRP 2. Pt not sure if he can do or not but will consider. He mainly walks to car and he rides scooter at store. Discussed importance of brilinta with stent. Reviewed NTG use, MI restrictions, heart healthy and low carb food choices (A1C at 6.4). pt interested in losing weight. Voiced understanding of ed.    Luetta Nutting, RN BSN  12/04/2019 9:00 AM

## 2019-12-04 NOTE — Discharge Summary (Signed)
Discharge Summary    Patient ID: Jorge Clements MRN: [id number]; DOB: 10-05-1945  Admit date: 12/01/2019 Discharge date: 12/04/2019  Primary Care Provider: Benita Stabile, MD  Primary Cardiologist: Reatha Harps, MD   Discharge Diagnoses    Principal Problem:   Non-ST elevation (NSTEMI) myocardial infarction [facility]) Active Problems:   COPD (chronic obstructive pulmonary disease) (HCC)   Essential hypertension   Status post transmetatarsal amputation of right foot [facility])   ACS (acute coronary syndrome) (HCC)   NSTEMI (non-ST elevated myocardial infarction) [facility])  Diagnostic Studies/Procedures    Echocardiogram 12/02/2019:  1. Left ventricular ejection fraction, by estimation, is 65 to 70%. The  left ventricle has normal function. The left ventricle has no regional  wall motion abnormalities. Left ventricular diastolic parameters were  normal.  2. Right ventricular systolic function is normal. The right ventricular  size is normal. There is normal pulmonary artery systolic pressure.  3. The mitral valve is normal in structure. Trivial mitral valve  regurgitation. No evidence of mitral stenosis.  4. The aortic valve is grossly normal. Aortic valve regurgitation is not  visualized. No aortic stenosis is present.  5. The inferior vena cava is normal in size with greater than 50%  respiratory variability, suggesting right atrial pressure of 3 mmHg.   LHC 12/03/2019:   High bifurcation of the posterior descending artery and posterior lateral artery in the mid RCA. RCA lesion, right at this bifurcation, is 99% stenosed. A drug-eluting stent was successfully placed using a STENT RESOLUTE ONYX 2.5X30, postdilated with a 2.75 balloon, including final kissing balloon angioplasty.  Post intervention, there is a 0% residual stenosis.  RPAV lesion is 99% stenosed. Thrombectomy was performed. Final, kissing balloon angioplasty was performed using a BALLOON SAPPHIRE 2.25X15.  Post  intervention, there is a 40% residual stenosis.  Prox LAD to Mid LAD lesion is 25% stenosed.  Prox RCA lesion is 25% stenosed.  The left ventricular systolic function is normal.  LV end diastolic pressure is normal.  The left ventricular ejection fraction is 55-65% by visual estimate.  There is no aortic valve stenosis.  Balloon angioplasty was performed using a BALLOON SAPPHIRE 2.25X15.  A drug-eluting stent was successfully placed using a STENT RESOLUTE ONYX 2.5X30.   Continue aggressive secondary prevention.  He will need dual antiplatelet therapy for a year.  High-dose statin as well.  I offered to speak with his family but he declined.  History of Present Illness     Jorge Clements is a 74 y.o. male with a history of peripheral arterial disease s/p right femoral to popliteal above-the-knee bypass, right transmetatarsal amputation, abdominal aortic aneurysm, hypertension, hyperlipidemia and COPD who presented for the evaluation of chest pain found to have a non-STEMI.  Patient was in his usual state of health until 1 day prior to evaluation when he had sudden onset of lightheadedness and left-sided chest pain described as a tightness.  He had no radiation, nausea or syncope.  He is chronically short of breath secondary to COPD however felt breathing was somewhat worse.  Given persistent chest pain, he presented to the ED for further evaluation.  He was started on IV NTG and was chest pain-free on cardiology assessment.  Hospital Course     Patient underwent LHC 12/03/2019 found to have an RCA lesion at the high bifurcation at 99% with DES/PCI placement. RPAV lesion at 99% with thrombectomy performed and final kissing balloon angioplasty.  Post intervention with 40% residual stenosis.  Proximal LAD to mid  LAD lesion at 25%, proximal RCA lesion at 25%.  LVEF stable at 55 to 65%.  Recommendations are for DAPT with ASA and Brilinta for a minimum of 1 year.   Non-STEMI: -Troponin  elevated with a peak at 188 with a stable EKG with no new changes -LHC as above -Continue ASA, statin>> Crestor -Continue metoprolol -Echocardiogram found to have stable EF at 65 to 70% with no wall motion abnormalities  Hypertension: -Stable, 148/58, 142/51, 172/59 -Enalapril stopped>> start losartan 25 mg tomorrow, 12/05/2019 to allow ACEI washout -Check labs at follow-up -Creatinine, 1.16 today -Metoprolol 25 twice daily added this admission  Hyperlipidemia: -LDL, 39 -Continue Crestor 40 mg daily  Peripheral arterial disease: -No acute issues -Continue ASA, statin  COPD: -No acute issues -Chronic shortness of breath  DM2: -Hemoglobin A1c found to be elevated at 6.4 -Needs close follow-up with PCP -Will start metformin 500mg  BID  -Would benefit from SLG 2 as next line of therapy  Consultants: None  The patient was seen and examined by Dr. Flora Lipps'Neal who feels that he is stable and ready for discharge today, 12/04/2019.  Cath site is stable without signs of hematoma or acute bleeding.  No further chest pain.  Patient has ambulated with cardiac rehabilitation without complication.  General: Well developed, well nourished, NAD Neck: Negative for carotid bruits. No JVD Lungs:Clear to ausculation bilaterally. Breathing is unlabored. Cardiovascular: RRR with S1 S2. No murmurs Abdomen: Soft, non-tender, non-distended Extremities: No edema. Radial pulses 2+ bilaterally Neuro: Alert and oriented. No focal deficits. No facial asymmetry. MAE spontaneously. Psych: Responds to questions appropriately with normal affect.    Did the patient have an acute coronary syndrome (MI, NSTEMI, STEMI, etc) this admission?:  Yes                               AHA/ACC Clinical Performance & Quality Measures: 1. Aspirin prescribed? - Yes 2. ADP Receptor Inhibitor (Plavix/Clopidogrel, Brilinta/Ticagrelor or Effient/Prasugrel) prescribed (includes medically managed patients)? - Yes 3. Beta Blocker  prescribed? - Yes 4. High Intensity Statin (Lipitor 40-80mg  or Crestor 20-40mg ) prescribed? - Yes 5. EF assessed during THIS hospitalization? - Yes 6. For EF <40%, was ACEI/ARB prescribed? - Not Applicable (EF >/= 40%) 7. For EF <40%, Aldosterone Antagonist (Spironolactone or Eplerenone) prescribed? - Not Applicable (EF >/= 40%) 8. Cardiac Rehab Phase II ordered (including medically managed patients)? - Yes   ____________  Discharge Vitals Blood pressure (!) 196/69, pulse 73, temperature 98 F (36.7 C), temperature source Oral, resp. rate 18, height 6' (1.829 m), weight 125.6 kg, SpO2 98 %.  Filed Weights   12/01/19 0004 12/02/19 0622 12/03/19 0453  Weight: 117.9 kg 117.3 kg 125.6 kg   Labs & Radiologic Studies    CBC Recent Labs    12/03/19 0303 12/04/19 0339  WBC 9.1 8.3  HGB 12.9* 12.6*  HCT 39.4 38.9*  MCV 90.0 90.0  PLT 123* 125*   Basic Metabolic Panel Recent Labs    16/01/9607/16/21 0303 12/04/19 0339  NA 134* 135  K 4.3 4.3  CL 99 103  CO2 23 23  GLUCOSE 116* 130*  BUN 24* 19  CREATININE 1.21 1.16  CALCIUM 9.3 9.0   Liver Function Tests No results for input(s): AST, ALT, ALKPHOS, BILITOT, PROT, ALBUMIN in the last 72 hours. No results for input(s): LIPASE, AMYLASE in the last 72 hours. High Sensitivity Troponin:   Recent Labs  Lab 12/01/19 0105 12/01/19 0319 12/01/19  1608 12/01/19 1819  TROPONINIHS 21* 147* 188* 162*    BNP Invalid input(s): POCBNP D-Dimer No results for input(s): DDIMER in the last 72 hours. Hemoglobin A1C Recent Labs    12/02/19 0926  HGBA1C 6.4*   Fasting Lipid Panel Recent Labs    12/02/19 0926  CHOL 103  HDL 38*  LDLCALC 39  TRIG 098  CHOLHDL 2.7   Thyroid Function Tests No results for input(s): TSH, T4TOTAL, T3FREE, THYROIDAB in the last 72 hours.  Invalid input(s): FREET3 _____________  CARDIAC CATHETERIZATION  Result Date: 12/03/2019  High bifurcation of the posterior descending artery and posterior lateral  artery in the mid RCA. RCA lesion, right at this bifurcation, is 99% stenosed. A drug-eluting stent was successfully placed using a STENT RESOLUTE ONYX 2.5X30, postdilated with a 2.75 balloon, including final kissing balloon angioplasty.  Post intervention, there is a 0% residual stenosis.  RPAV lesion is 99% stenosed. Thrombectomy was performed. Final, kissing balloon angioplasty was performed using a BALLOON SAPPHIRE 2.25X15.  Post intervention, there is a 40% residual stenosis.  Prox LAD to Mid LAD lesion is 25% stenosed.  Prox RCA lesion is 25% stenosed.  The left ventricular systolic function is normal.  LV end diastolic pressure is normal.  The left ventricular ejection fraction is 55-65% by visual estimate.  There is no aortic valve stenosis.  Balloon angioplasty was performed using a BALLOON SAPPHIRE 2.25X15.  A drug-eluting stent was successfully placed using a STENT RESOLUTE ONYX 2.5X30.  Continue aggressive secondary prevention.  He will need dual antiplatelet therapy for a year.  High-dose statin as well. I offered to speak with his family but he declined.   DG Chest Portable 1 View  Result Date: 12/01/2019 CLINICAL DATA:  Chest pain. EXAM: PORTABLE CHEST 1 VIEW COMPARISON:  08/11/2017 FINDINGS: Chronic hyperinflation. Slight paucity of upper lobe lung markings suggests emphysema. No focal airspace disease. Unchanged heart size and mediastinal contours. Aortic atherosclerosis. No pneumothorax or large pleural effusion. No acute osseous abnormalities are seen. IMPRESSION: Chronic hyperinflation without acute abnormality. Aortic Atherosclerosis (ICD10-I70.0). Electronically Signed   By: Narda Rutherford M.D.   On: 12/01/2019 01:58   ECHOCARDIOGRAM COMPLETE  Result Date: 12/02/2019    ECHOCARDIOGRAM REPORT   Patient Name:   Jorge Clements Date of Exam: 12/02/2019 Medical Rec #:  [id number]      Height:       72.0 in Accession #:    [id number]     Weight:       258.6 lb Date of Birth:   January 09, 1946       BSA:          2.377 m Patient Age:    74 years       BP:           130/48 mmHg Patient Gender: M              HR:           60 bpm. Exam Location:  Inpatient Procedure: 2D Echo, Cardiac Doppler, Color Doppler and Intracardiac            Opacification Agent Indications:    NSTEMI I21.4  History:        Patient has no prior history of Echocardiogram examinations.                 COPD; Risk Factors:Hypertension, Dyslipidemia and Former Smoker.                 AAA. PAD.  Sonographer:    Renella Cunas RDCS Referring Phys: [id number] Evern Bio WEAVER IMPRESSIONS  1. Left ventricular ejection fraction, by estimation, is 65 to 70%. The left ventricle has normal function. The left ventricle has no regional wall motion abnormalities. Left ventricular diastolic parameters were normal.  2. Right ventricular systolic function is normal. The right ventricular size is normal. There is normal pulmonary artery systolic pressure.  3. The mitral valve is normal in structure. Trivial mitral valve regurgitation. No evidence of mitral stenosis.  4. The aortic valve is grossly normal. Aortic valve regurgitation is not visualized. No aortic stenosis is present.  5. The inferior vena cava is normal in size with greater than 50% respiratory variability, suggesting right atrial pressure of 3 mmHg. Comparison(s): No prior Echocardiogram. Conclusion(s)/Recommendation(s): Normal biventricular function without evidence of hemodynamically significant valvular heart disease. FINDINGS  Left Ventricle: Left ventricular ejection fraction, by estimation, is 65 to 70%. The left ventricle has normal function. The left ventricle has no regional wall motion abnormalities. Definity contrast agent was given IV to delineate the left ventricular  endocardial borders. The left ventricular internal cavity size was normal in size. There is no left ventricular hypertrophy. Left ventricular diastolic parameters were normal. Right Ventricle: The right  ventricular size is normal. No increase in right ventricular wall thickness. Right ventricular systolic function is normal. There is normal pulmonary artery systolic pressure. The tricuspid regurgitant velocity is 1.56 m/s, and  with an assumed right atrial pressure of 3 mmHg, the estimated right ventricular systolic pressure is 12.7 mmHg. Left Atrium: Left atrial size was normal in size. Right Atrium: Right atrial size was normal in size. Pericardium: There is no evidence of pericardial effusion. Mitral Valve: The mitral valve is normal in structure. Trivial mitral valve regurgitation. No evidence of mitral valve stenosis. Tricuspid Valve: The tricuspid valve is normal in structure. Tricuspid valve regurgitation is trivial. No evidence of tricuspid stenosis. Aortic Valve: The aortic valve is grossly normal. Aortic valve regurgitation is not visualized. No aortic stenosis is present. Pulmonic Valve: The pulmonic valve was not well visualized. Pulmonic valve regurgitation is not visualized. Aorta: The aortic root, ascending aorta and aortic arch are all structurally normal, with no evidence of dilitation or obstruction. Venous: The inferior vena cava is normal in size with greater than 50% respiratory variability, suggesting right atrial pressure of 3 mmHg. IAS/Shunts: No atrial level shunt detected by color flow Doppler.  LEFT VENTRICLE PLAX 2D LVIDd:         5.00 cm  Diastology LVIDs:         3.10 cm  LV e' lateral:   8.59 cm/s LV PW:         0.80 cm  LV E/e' lateral: 11.8 LV IVS:        0.80 cm  LV e' medial:    7.51 cm/s LVOT diam:     2.50 cm  LV E/e' medial:  13.4 LV SV:         165 LV SV Index:   69 LVOT Area:     4.91 cm  RIGHT VENTRICLE RV S prime:     12.30 cm/s TAPSE (M-mode): 1.9 cm LEFT ATRIUM           Index       RIGHT ATRIUM           Index LA diam:      3.40 cm 1.43 cm/m  RA Area:     11.90 cm LA Vol (A2C): 37.4  ml 15.74 ml/m RA Volume:   23.90 ml  10.06 ml/m LA Vol (A4C): 43.2 ml 18.18 ml/m   AORTIC VALVE LVOT Vmax:   138.00 cm/s LVOT Vmean:  99.800 cm/s LVOT VTI:    0.336 m  AORTA Ao Root diam: 3.50 cm MITRAL VALVE                TRICUSPID VALVE MV Area (PHT): 3.39 cm     TR Peak grad:   9.7 mmHg MV Decel Time: 224 msec     TR Vmax:        156.00 cm/s MR Peak grad: 14.6 mmHg MR Vmax:      191.00 cm/s   SHUNTS MV E velocity: 101.00 cm/s  Systemic VTI:  0.34 m MV A velocity: 80.60 cm/s   Systemic Diam: 2.50 cm MV E/A ratio:  1.25 Jodelle Red MD Electronically signed by Jodelle Red MD Signature Date/Time: 12/02/2019/4:19:01 PM    Final    Disposition   Pt is being discharged home today in good condition.  Follow-up Plans & Appointments    Follow-up Information    Netta Neat., NP Follow up on 12/13/2019.   Specialty: Cardiology Why: at 2:30 Contact information: [address] [place] [address] [phone]              Discharge Instructions    Amb Referral to Cardiac Rehabilitation   Complete by: As directed    Diagnosis:  NSTEMI Coronary Stents     After initial evaluation and assessments completed: Virtual Based Care may be provided alone or in conjunction with Phase 2 Cardiac Rehab based on patient barriers.: Yes   Call MD for:  difficulty breathing, headache or visual disturbances   Complete by: As directed    Call MD for:  extreme fatigue   Complete by: As directed    Call MD for:  hives   Complete by: As directed    Call MD for:  persistant dizziness or light-headedness   Complete by: As directed    Call MD for:  persistant nausea and vomiting   Complete by: As directed    Call MD for:  redness, tenderness, or signs of infection (pain, swelling, redness, odor or green/yellow discharge around incision site)   Complete by: As directed    Call MD for:  severe uncontrolled pain   Complete by: As directed    Call MD for:  temperature >100.4   Complete by: As directed    Diet - low sodium heart healthy   Complete by: As  directed    Discharge instructions   Complete by: As directed    PLEASE DO NOT MISS ANY DOSES OF YOUR BRILINTA!!!!! Also keep a log of you blood pressures and bring back to your follow up appt. Please call the office with any questions.   Patients taking blood thinners should generally stay away from medicines like ibuprofen, Advil, Motrin, naproxen, and Aleve due to risk of stomach bleeding. You may take Tylenol as directed or talk to your primary doctor about alternatives.  Some studies suggest Prilosec/Omeprazole interacts with Plavix. If you have reflux symptoms, please use Protonix for less chance of interaction.   No driving for 3-5 days. No lifting over 5 lbs for 1 week. No sexual activity for 1 week. Keep procedure site clean & dry. If you notice increased pain, swelling, bleeding or pus, call/return!  You may shower, but no soaking baths/hot tubs/pools for 1 week.  Increase activity slowly   Complete by: As directed      Discharge Medications   Allergies as of 12/04/2019      Reactions   Penicillins Hives, Rash   Childhood reaction Has patient had a PCN reaction causing immediate rash, facial/tongue/throat swelling, SOB or lightheadedness with hypotension: No PT DEVELOPED ## SEVERE ## RASH INVOLVING MUCUS MEMBRANES or SKIN NECROSIS: #  #  YES  #  # Has patient had a PCN reaction that required hospitalization: Unknown Has patient had a PCN reaction occurring within the last 10 years: No   Clindamycin Rash   Diffuse rash across body      Medication List    STOP taking these medications   aspirin 325 MG tablet Replaced by: aspirin 81 MG chewable tablet   enalapril 10 MG tablet Commonly known as: VASOTEC     TAKE these medications   Albuterol Sulfate 108 (90 Base) MCG/ACT Aepb Commonly known as: PROAIR RESPICLICK Inhale 90 mcg into the lungs.   aspirin 81 MG chewable tablet Chew 1 tablet (81 mg total) by mouth daily. Replaces: aspirin 325 MG tablet   Breztri  Aerosphere 160-9-4.8 MCG/ACT Aero Generic drug: Budeson-Glycopyrrol-Formoterol Inhale 2 puffs into the lungs 2 (two) times daily.   CENTRUM SILVER 50+MEN PO Take by mouth.   hydrochlorothiazide 12.5 MG capsule Commonly known as: MICROZIDE Take 2 capsules (25 mg total) by mouth daily. What changed:   how much to take  Another medication with the same name was removed. Continue taking this medication, and follow the directions you see here.   losartan 25 MG tablet Commonly known as: Cozaar Take 1 tablet (25 mg total) by mouth daily. Start taking on: December 05, 2019   metFORMIN 500 MG tablet Commonly known as: GLUCOPHAGE Take 1 tablet (500 mg total) by mouth 2 (two) times daily with a meal.   metoprolol tartrate 25 MG tablet Commonly known as: LOPRESSOR Take 1 tablet (25 mg total) by mouth 2 (two) times daily.   rosuvastatin 40 MG tablet Commonly known as: CRESTOR Take 1 tablet (40 mg total) by mouth daily. What changed:   medication strength  how much to take  Another medication with the same name was removed. Continue taking this medication, and follow the directions you see here.   ticagrelor 90 MG Tabs tablet Commonly known as: BRILINTA Take 1 tablet (90 mg total) by mouth 2 (two) times daily.   traZODone 50 MG tablet Commonly known as: DESYREL TAKE 1 TO 2 TABLETS BY MOUTH EVERY DAY AT BEDTIME AS NEEDED FOR INSOMNIA        Outstanding Labs/Studies   BMET   Duration of Discharge Encounter   Greater than 30 minutes including physician time.  Signed, Georgie Chard, NP 12/04/2019, 9:50 AM

## 2019-12-04 NOTE — Discharge Instructions (Signed)
Radial Site Care  This sheet gives you information about how to care for yourself after your procedure. Your health care provider may also give you more specific instructions. If you have problems or questions, contact your health care provider. What can I expect after the procedure? After the procedure, it is common to have:  Bruising and tenderness at the catheter insertion area. Follow these instructions at home: Medicines  Take over-the-counter and prescription medicines only as told by your health care provider. Insertion site care  Follow instructions from your health care provider about how to take care of your insertion site. Make sure you: ? Wash your hands with soap and water before you change your bandage (dressing). If soap and water are not available, use hand sanitizer. ? Change your dressing as told by your health care provider. ? Leave stitches (sutures), skin glue, or adhesive strips in place. These skin closures may need to stay in place for 2 weeks or longer. If adhesive strip edges start to loosen and curl up, you may trim the loose edges. Do not remove adhesive strips completely unless your health care provider tells you to do that.  Check your insertion site every day for signs of infection. Check for: ? Redness, swelling, or pain. ? Fluid or blood. ? Pus or a bad smell. ? Warmth.  Do not take baths, swim, or use a hot tub until your health care provider approves.  You may shower 24-48 hours after the procedure, or as directed by your health care provider. ? Remove the dressing and gently wash the site with plain soap and water. ? Pat the area dry with a clean towel. ? Do not rub the site. That could cause bleeding.  Do not apply powder or lotion to the site. Activity   For 24 hours after the procedure, or as directed by your health care provider: ? Do not flex or bend the affected arm. ? Do not push or pull heavy objects with the affected arm. ? Do not  drive yourself home from the hospital or clinic. You may drive 24 hours after the procedure unless your health care provider tells you not to. ? Do not operate machinery or power tools.  Do not lift anything that is heavier than 10 lb (4.5 kg), or the limit that you are told, until your health care provider says that it is safe.  Ask your health care provider when it is okay to: ? Return to work or school. ? Resume usual physical activities or sports. ? Resume sexual activity. General instructions  If the catheter site starts to bleed, raise your arm and put firm pressure on the site. If the bleeding does not stop, get help right away. This is a medical emergency.  If you went home on the same day as your procedure, a responsible adult should be with you for the first 24 hours after you arrive home.  Keep all follow-up visits as told by your health care provider. This is important. Contact a health care provider if:  You have a fever.  You have redness, swelling, or yellow drainage around your insertion site. Get help right away if:  You have unusual pain at the radial site.  The catheter insertion area swells very fast.  The insertion area is bleeding, and the bleeding does not stop when you hold steady pressure on the area.  Your arm or hand becomes pale, cool, tingly, or numb. These symptoms may represent a serious problem   that is an emergency. Do not wait to see if the symptoms will go away. Get medical help right away. Call your local emergency services (911 in the [place] Do not drive yourself to the hospital. Summary  After the procedure, it is common to have bruising and tenderness at the site.  Follow instructions from your health care provider about how to take care of your radial site wound. Check the wound every day for signs of infection.  Do not lift anything that is heavier than 10 lb (4.5 kg), or the limit that you are told, until your health care provider says  that it is safe. This information is not intended to replace advice given to you by your health care provider. Make sure you discuss any questions you have with your health care provider. Document Revised: 05/11/2017 Document Reviewed: 05/11/2017 Elsevier Patient Education  2020 [employer]  

## 2019-12-06 ENCOUNTER — Emergency Department (HOSPITAL_COMMUNITY)
Admission: EM | Admit: 2019-12-06 | Discharge: 2019-12-07 | Disposition: A | Discharge status: Home / Self Care | Payer: Medicare HMO | Attending: Emergency Medicine | Admitting: Emergency Medicine

## 2019-12-06 ENCOUNTER — Telehealth: Payer: Self-pay | Admitting: Cardiovascular Disease

## 2019-12-06 ENCOUNTER — Emergency Department (HOSPITAL_COMMUNITY): Payer: Medicare HMO

## 2019-12-06 ENCOUNTER — Encounter (HOSPITAL_COMMUNITY): Payer: Self-pay | Admitting: Emergency Medicine

## 2019-12-06 ENCOUNTER — Other Ambulatory Visit: Payer: Self-pay

## 2019-12-06 DIAGNOSIS — E871 Hypo-osmolality and hyponatremia: Secondary | ICD-10-CM | POA: Diagnosis not present

## 2019-12-06 DIAGNOSIS — I2510 Atherosclerotic heart disease of native coronary artery without angina pectoris: Secondary | ICD-10-CM | POA: Diagnosis not present

## 2019-12-06 DIAGNOSIS — Z880 Allergy status to penicillin: Secondary | ICD-10-CM | POA: Diagnosis not present

## 2019-12-06 DIAGNOSIS — J90 Pleural effusion, not elsewhere classified: Secondary | ICD-10-CM | POA: Diagnosis not present

## 2019-12-06 DIAGNOSIS — I10 Essential (primary) hypertension: Secondary | ICD-10-CM

## 2019-12-06 DIAGNOSIS — R5381 Other malaise: Secondary | ICD-10-CM | POA: Insufficient documentation

## 2019-12-06 DIAGNOSIS — R0600 Dyspnea, unspecified: Secondary | ICD-10-CM | POA: Insufficient documentation

## 2019-12-06 DIAGNOSIS — J449 Chronic obstructive pulmonary disease, unspecified: Secondary | ICD-10-CM | POA: Diagnosis not present

## 2019-12-06 DIAGNOSIS — Z79899 Other long term (current) drug therapy: Secondary | ICD-10-CM | POA: Diagnosis not present

## 2019-12-06 DIAGNOSIS — Z87891 Personal history of nicotine dependence: Secondary | ICD-10-CM | POA: Insufficient documentation

## 2019-12-06 DIAGNOSIS — Z7982 Long term (current) use of aspirin: Secondary | ICD-10-CM | POA: Insufficient documentation

## 2019-12-06 DIAGNOSIS — J9811 Atelectasis: Secondary | ICD-10-CM | POA: Diagnosis not present

## 2019-12-06 LAB — CBC
HCT: 41.5 % (ref 39.0–52.0)
Hemoglobin: 13.8 g/dL (ref 13.0–17.0)
MCH: 29.6 pg (ref 26.0–34.0)
MCHC: 33.3 g/dL (ref 30.0–36.0)
MCV: 89.1 fL (ref 80.0–100.0)
Platelets: 150 10*3/uL (ref 150–400)
RBC: 4.66 MIL/uL (ref 4.22–5.81)
RDW: 14.5 % (ref 11.5–15.5)
WBC: 12.2 10*3/uL — ABNORMAL HIGH (ref 4.0–10.5)
nRBC: 0 % (ref 0.0–0.2)

## 2019-12-06 LAB — BASIC METABOLIC PANEL
Anion gap: 14 (ref 5–15)
BUN: 23 mg/dL (ref 8–23)
CO2: 20 mmol/L — ABNORMAL LOW (ref 22–32)
Calcium: 9 mg/dL (ref 8.9–10.3)
Chloride: 92 mmol/L — ABNORMAL LOW (ref 98–111)
Creatinine, Ser: 1.06 mg/dL (ref 0.61–1.24)
GFR calc Af Amer: >60 mL/min (ref >60)
GFR calc non Af Amer: >60 mL/min (ref >60)
Glucose, Bld: 100 mg/dL — ABNORMAL HIGH (ref 70–99)
Potassium: 3.4 mmol/L — ABNORMAL LOW (ref 3.5–5.1)
Sodium: 126 mmol/L — ABNORMAL LOW (ref 135–145)

## 2019-12-06 LAB — BRAIN NATRIURETIC PEPTIDE: B Natriuretic Peptide: 59 pg/mL (ref 0.0–100.0)

## 2019-12-06 LAB — TROPONIN I (HIGH SENSITIVITY): Troponin I (High Sensitivity): 16 ng/L (ref <18)

## 2019-12-06 NOTE — ED Triage Notes (Signed)
Pt c/o HTN that began today. Pt states he had a  MI with stent and balloon placed on Monday.

## 2019-12-06 NOTE — ED Provider Notes (Signed)
[facility] EMERGENCY DEPARTMENT Provider Note   CSN: [id number] Arrival date & time: 12/06/19  1955     History Chief Complaint  Patient presents with  . Hypertension    Jorge Clements is a 74 y.o. male.  HPI   Patient presents to the ED for evaluation of generalized malaise and some shortness of breath.  Patient also noticed that his blood pressure was elevated and thought this could be contributing to his symptoms.  Patient does have a history of coronary artery disease.  Patient was admitted to the hospital earlier this month for non-ST elevation MI.  Patient ended up having thrombectomy with balloon angioplasty and stent placement.  Patient was discharged from the hospital 2 days ago.  Patient called the cardiologist office today because he started to feel lightheaded.  He also noted that his blood pressure was high and he was feeling flushed.  Patient also states he was having some shortness of breath although he chronically does have COPD.  Patient does not have any chest pain.  He is not having any fevers or chills.  Patient has been fully vaccinated for Covid and he did test negative on the 14th of this month.  Past Medical History:  Diagnosis Date  . Abdominal aortic aneurysm    no AAA on [place] in 2020  . Chronic obstructive pulmonary disease   . HLD (hyperlipidemia)   . HTN (hypertension)   . Peripheral artery disease    s/p R fem pop // s/p R transmet amp    Patient Active Problem List   Diagnosis Date Noted  . ACS (acute coronary syndrome) (HCC) 12/01/2019  . Non-ST elevation (NSTEMI) myocardial infarction (HCC) 12/01/2019  . NSTEMI (non-ST elevated myocardial infarction) (HCC) 12/01/2019  . Status post transmetatarsal amputation of right foot (HCC)   . Subacute osteomyelitis, right ankle and foot (HCC)   . Infected blister of foot 08/13/2017  . Pressure injury of skin 08/12/2017  . Weakness 08/11/2017  . COPD (chronic obstructive pulmonary disease) (HCC) 08/11/2017   . Cellulitis of right lower extremity 08/11/2017  . Fall at home, initial encounter 08/11/2017  . Thrombocytopenia (HCC) 08/11/2017  . Weakness generalized 08/11/2017  . Hypokalemia 08/11/2017  . Essential hypertension 08/11/2017    Past Surgical History:  Procedure Laterality Date  . ABDOMINAL AORTOGRAM N/A 08/17/2017   Procedure: ABDOMINAL AORTOGRAM;  Surgeon: Maeola Harman, MD;  Location: [facility] INVASIVE CV LAB;  Service: Cardiovascular;  Laterality: N/A;  . AMPUTATION Right 08/21/2017   Procedure: TRANSMETATARSAL AMPUTATION RIGHT FOOT;  Surgeon: Nadara Mustard, MD;  Location: [facility] OR;  Service: Orthopedics;  Laterality: Right;  . AMPUTATION TOE Right 08/18/2017   Procedure: AMPUTATION RIGHT SECOND AND THIRD TOES;  Surgeon: Maeola Harman, MD;  Location: [facility] OR;  Service: Vascular;  Laterality: Right;  . CORONARY BALLOON ANGIOPLASTY N/A 12/03/2019   Procedure: CORONARY BALLOON ANGIOPLASTY;  Surgeon: Corky Crafts, MD;  Location: [facility] INVASIVE CV LAB;  Service: Cardiovascular;  Laterality: N/A;  . CORONARY STENT INTERVENTION N/A 12/03/2019   Procedure: CORONARY STENT INTERVENTION;  Surgeon: Corky Crafts, MD;  Location: [facility] INVASIVE CV LAB;  Service: Cardiovascular;  Laterality: N/A;  . ENDARTERECTOMY FEMORAL Right 08/18/2017   Procedure: ENDARTERECTOMY RIGHT SUPERFICIAL Alfredia Ferguson;  Surgeon: Maeola Harman, MD;  Location: [facility] OR;  Service: Vascular;  Laterality: Right;  . FEMORAL-POPLITEAL BYPASS GRAFT Right 08/18/2017   Procedure: BYPASS GRAFT RIGHT COMMON FEMORAL TO ABOVE KNEE POPLITEAL ARTERY USING RIGHT REVERSED GREAT SAPHENOUS VEIN;  Surgeon:  Maeola Harmanain, Brandon Christopher, MD;  [facility];  Service: Vascular;  Laterality: Right;  . LEFT HEART CATH AND CORONARY ANGIOGRAPHY N/A 12/03/2019   Procedure: LEFT HEART CATH AND CORONARY ANGIOGRAPHY;  Surgeon: Corky CraftsVaranasi, Jayadeep S, MD;  [facility];  Service: Cardiovascular;  Laterality: N/A;    . LOWER EXTREMITY ANGIOGRAPHY Right 08/17/2017   Procedure: Lower Extremity Angiography;  Surgeon: Maeola Harmanain, Brandon Christopher, MD;  [facility];  Service: Cardiovascular;  Laterality: Right;  . PERIPHERAL VASCULAR BALLOON ANGIOPLASTY Right 08/17/2017   Procedure: PERIPHERAL VASCULAR BALLOON ANGIOPLASTY;  Surgeon: Maeola Harmanain, Brandon Christopher, MD;  [facility];  Service: Cardiovascular;  Laterality: Right;  SFA UNABLE TO CROSS  . VEIN HARVEST Right 08/18/2017   Procedure: VEIN HARVEST RIGHT GREAT SAPHENOUS;  Surgeon: Maeola Harmanain, Brandon Christopher, MD;  [facility];  Service: Vascular;  Laterality: Right;  . WOUND DEBRIDEMENT Right 08/18/2017   Procedure: DEBRIDEMENT WOUND RIGHT FOOT;  Surgeon: Maeola Harmanain, Brandon Christopher, MD;  [facility];  Service: Vascular;  Laterality: Right;       Family History  Adopted: Yes    Social History   Tobacco Use  . Smoking status: Former Games developermoker  . Smokeless tobacco: Never Used  Vaping Use  . Vaping Use: Former  Substance Use Topics  . Alcohol use: Not Currently  . Drug use: No    Home Medications Prior to Admission medications   Medication Sig Start Date End Date Taking? Authorizing Provider  Albuterol Sulfate 108 (90 Base) MCG/ACT AEPB Inhale 90 mcg into the lungs.    [provider]  aspirin 81 MG chewable tablet Chew 1 tablet (81 mg total) by mouth daily. 12/04/19   Filbert SchilderMcDaniel, Jill D, NP  BREZTRI AEROSPHERE 160-9-4.8 MCG/ACT AERO Inhale 2 puffs into the lungs 2 (two) times daily. 07/09/19   [provider]  hydrochlorothiazide (MICROZIDE) 12.5 MG capsule Take 2 capsules (25 mg total) by mouth daily. 12/04/19   Filbert SchilderMcDaniel, Jill D, NP  losartan (COZAAR) 25 MG tablet Take 1 tablet (25 mg total) by mouth daily. 12/05/19 12/04/20  Georgie ChardMcDaniel, Jill D, NP  metFORMIN (GLUCOPHAGE) 500 MG tablet Take 1 tablet (500 mg total) by mouth 2 (two) times daily with a meal. 12/04/19   Georgie ChardMcDaniel, Jill D, NP  metoprolol tartrate  (LOPRESSOR) 25 MG tablet Take 1 tablet (25 mg total) by mouth 2 (two) times daily. 12/04/19   Filbert SchilderMcDaniel, Jill D, NP  Multiple Vitamins-Minerals (CENTRUM SILVER 50+MEN PO) Take by mouth.    [provider]  rosuvastatin (CRESTOR) 40 MG tablet Take 1 tablet (40 mg total) by mouth daily. 12/04/19   Georgie ChardMcDaniel, Jill D, NP  ticagrelor (BRILINTA) 90 MG TABS tablet Take 1 tablet (90 mg total) by mouth 2 (two) times daily. 12/04/19   Filbert SchilderMcDaniel, Jill D, NP  traZODone (DESYREL) 50 MG tablet TAKE 1 TO 2 TABLETS BY MOUTH EVERY DAY AT BEDTIME AS NEEDED FOR INSOMNIA 11/06/19   [provider]    Allergies    Penicillins and Clindamycin  Review of Systems   Review of Systems  All other systems reviewed and are negative.   Physical Exam Updated Vital Signs BP (!) 179/58 (BP Location: Right Arm)   Pulse 70   Temp 98 F (36.7 C) (Oral)   Resp 18   Ht 1.829 m (6')   Wt 125.6 kg   SpO2 99%   BMI 37.57 kg/m   Physical Exam Vitals and nursing note reviewed.  Constitutional:  General: He is not in acute distress.    Appearance: He is well-developed.  HENT:     Head: Normocephalic and atraumatic.     Right Ear: External ear normal.     Left Ear: External ear normal.  Eyes:     General: No scleral icterus.       Right eye: No discharge.        Left eye: No discharge.     Conjunctiva/sclera: Conjunctivae normal.  Neck:     Trachea: No tracheal deviation.  Cardiovascular:     Rate and Rhythm: Normal rate and regular rhythm.  Pulmonary:     Effort: Pulmonary effort is normal. No respiratory distress.     Breath sounds: Normal breath sounds. No stridor. No wheezing or rales.  Abdominal:     General: Bowel sounds are normal. There is no distension.     Palpations: Abdomen is soft.     Tenderness: There is no abdominal tenderness. There is no guarding or rebound.  Musculoskeletal:        General: No tenderness.     Cervical back: Neck supple.     Right lower leg: Edema present.      Left lower leg: Edema present.     Comments: Trace edema bilateral lower extremities  Skin:    General: Skin is warm and dry.     Findings: No rash.  Neurological:     Mental Status: He is alert.     Cranial Nerves: No cranial nerve deficit (no facial droop, extraocular movements intact, no slurred speech).     Sensory: No sensory deficit.     Motor: No abnormal muscle tone or seizure activity.     Coordination: Coordination normal.     ED Results / Procedures / Treatments   Labs (all labs ordered are listed, but only abnormal results are displayed) Labs Reviewed  BASIC METABOLIC PANEL - Abnormal; Notable for the following components:      Result Value   Sodium 126 (*)    Potassium 3.4 (*)    Chloride 92 (*)    CO2 20 (*)    Glucose, Bld 100 (*)    All other components within normal limits  CBC - Abnormal; Notable for the following components:   WBC 12.2 (*)    All other components within normal limits  BRAIN NATRIURETIC PEPTIDE  TROPONIN I (HIGH SENSITIVITY)  TROPONIN I (HIGH SENSITIVITY)    EKG Rate 68 sinus rhythm with first-degree AV block left anterior fascicular block Slight ST depression lateral leads No significant change from last tracing  Radiology DG Chest 2 View  Result Date: 12/06/2019 CLINICAL DATA:  Hypertension EXAM: CHEST - 2 VIEW COMPARISON:  12/01/2019, 08/11/2017 FINDINGS: No focal consolidation or pleural effusion. Minimal atelectasis at the lingula. Mild bronchitic changes. Normal heart size. No pneumothorax. IMPRESSION: No active cardiopulmonary disease. Mild bronchitic changes. Minimal atelectasis at the lingula Electronically Signed   By: Jasmine Pang M.D.   On: 12/06/2019 20:47    Procedures Procedures (including critical care time)  Medications Ordered in ED Medications  sodium chloride 0.9 % bolus 500 mL (has no administration in time range)    ED Course  I have reviewed the triage vital signs and the nursing notes.  Pertinent  labs & imaging results that were available during my care of the patient were reviewed by me and considered in my medical decision making (see chart for details).  Clinical Course as of Dec 06 1  Fri  Dec 07, 2019  0001 Patient's laboratory tests show an elevated white blood cell count.   [JK]  0001 No other signs of infection.  BNP and troponin are normal.   [JK]  0001 Sodium is decreased compared to previous.   [JK]    Clinical Course User Index [JK] Linwood Dibbles, MD   MDM Rules/Calculators/A&P                          Pt presents with complaints after recent nstemi and stent.  No cp tonight.  HTN noted.  Labs notable for hyponatremia.  No signs of ACS.  Delta trop pending.  PT given IV fluids for his hypnonatremia.  Will check delta troponin.  If negative, plan on dc  Care turned over to Dr Devoria Albe Final Clinical Impression(s) / ED Diagnoses Final diagnoses:  Hyponatremia  Hypertension, unspecified type    Rx / DC Orders ED Discharge Orders    None       Linwood Dibbles, MD 12/07/19 0004

## 2019-12-06 NOTE — Telephone Encounter (Signed)
Returned call to patient no answer.Unable to leave a message no voice mail. ?

## 2019-12-06 NOTE — Telephone Encounter (Signed)
New Message  STAT if patient feels like he/she is going to faint   1) Are you dizzy now? Yes  2) Do you feel faint or have you passed out? No  3) Do you have any other symptoms? Feeling flushed and dizzy like he feels when his BP is high.   4) Have you checked your HR and BP (record if available)? N/a, does not have a BP cuff yet

## 2019-12-07 LAB — TROPONIN I (HIGH SENSITIVITY): Troponin I (High Sensitivity): 17 ng/L (ref <18)

## 2019-12-07 MED ORDER — SODIUM CHLORIDE 0.9 % IV BOLUS
500.0000 mL | Freq: Once | INTRAVENOUS | Status: AC
  Administered 2019-12-07: 500 mL via INTRAVENOUS

## 2019-12-07 NOTE — Discharge Instructions (Signed)
Follow-up with your primary care doctor to have your sodium level rechecked.  Continue your blood pressure medications.  Return as needed for worsening symptoms

## 2019-12-10 NOTE — Telephone Encounter (Signed)
No voice mail unable to leave message Will try later./cy

## 2019-12-13 ENCOUNTER — Other Ambulatory Visit: Payer: Self-pay

## 2019-12-13 ENCOUNTER — Encounter: Payer: Self-pay | Admitting: Family Medicine

## 2019-12-13 ENCOUNTER — Ambulatory Visit (INDEPENDENT_AMBULATORY_CARE_PROVIDER_SITE_OTHER): Payer: Medicare HMO | Admitting: Family Medicine

## 2019-12-13 VITALS — BP 148/72 | HR 74 | Ht 72.0 in | Wt 271.4 lb

## 2019-12-13 DIAGNOSIS — I10 Essential (primary) hypertension: Secondary | ICD-10-CM | POA: Diagnosis not present

## 2019-12-13 DIAGNOSIS — I214 Non-ST elevation (NSTEMI) myocardial infarction: Secondary | ICD-10-CM | POA: Diagnosis not present

## 2019-12-13 DIAGNOSIS — E782 Mixed hyperlipidemia: Secondary | ICD-10-CM | POA: Diagnosis not present

## 2019-12-13 MED ORDER — LOSARTAN POTASSIUM 50 MG PO TABS: 50.0000 mg | ORAL_TABLET | Freq: Every day | ORAL | 6 refills | Status: DC

## 2019-12-13 MED ORDER — NITROGLYCERIN 0.4 MG SL SUBL: 0.4000 mg | SUBLINGUAL_TABLET | SUBLINGUAL | 3 refills | Status: DC | PRN

## 2019-12-13 NOTE — Progress Notes (Addendum)
Cardiology Office Note  Date: 12/13/2019   ID: Jorge Clements, DOB [birth year], MRN [id number]  PCP:  Benita Stabile, MD  Cardiologist:  Reatha Harps, MD Electrophysiologist:  None   Chief Complaint: Follow-up hospitalization NSTEMI, hypertension  History of Present Illness: Jorge Clements is a 74 y.o. male with a history of COPD, PVD status post bypass of the right leg, hypertension, HTN.  Presented to [facility] emergency room 12/01/2019 with chest tightness, lightheadedness, dizziness, shortness of breath.  Describes it as a tight sensation on the left side of the chest with burning in his left arm associated with shortness of breath, nausea, lightheadedness.  He denies any cardiac history.  No prior stress test or cardiac catheterization.  He was admitted to [facility] for ACS/NSTEMI  Underwent cardiac catheterization 12/03/2019 revealing lesion of mid to distal RCA with DES to RCA, RPDA, and posterior lateral branch, underwent PCI to this area RPAV lesion  99% stenosed. Thrombectomy was performed. Final, kissing balloon angioplasty was performed using a BALLOON SAPPHIRE 2.25X15. Right radial cath site was clean and dry.  He was discharged on aspirin and Brilinta.  He was started on beta-blocker and high intensity statin.  His blood pressure had been elevated during admission.  His enalapril was changed to losartan.  He was continue metoprolol and HCTZ.  Patient is here today for hospital follow-up status post recent NSTEMI with DES to RCA and balloon angioplasty to RP AV lesion, thrombectomy performed with balloon angioplasty.  He denies any chest pain, chest pressure, chest tightness, neck, arm, back, or jaw pain.  He states he did have some unusual feeling in his chest and went to the emergency room and was found to be hyponatremic.  He does have chronic dyspnea from COPD.  He denies any bleeding, he is tolerating his new medications well.  Blood pressure continues to be elevated.  Blood  pressure today is 148/72.  He states this is approximately what he has been getting at home since discharge from the hospital.  He denies any PND, orthopnea, palpitations or arrhythmias, orthostatic symptoms, CVA or TIA-like symptoms.  No claudication-like symptoms, DVT or PE-like symptoms.    Past Medical History:  Diagnosis Date  . Abdominal aortic aneurysm    no AAA on [place] in 2020  . Chronic obstructive pulmonary disease   . HLD (hyperlipidemia)   . HTN (hypertension)   . Peripheral artery disease    s/p R fem pop // s/p R transmet amp    Past Surgical History:  Procedure Laterality Date  . ABDOMINAL AORTOGRAM N/A 08/17/2017   Procedure: ABDOMINAL AORTOGRAM;  Surgeon: Maeola Harman, MD;  Location: [facility] INVASIVE CV LAB;  Service: Cardiovascular;  Laterality: N/A;  . AMPUTATION Right 08/21/2017   Procedure: TRANSMETATARSAL AMPUTATION RIGHT FOOT;  Surgeon: Nadara Mustard, MD;  Location: [facility] OR;  Service: Orthopedics;  Laterality: Right;  . AMPUTATION TOE Right 08/18/2017   Procedure: AMPUTATION RIGHT SECOND AND THIRD TOES;  Surgeon: Maeola Harman, MD;  Location[facility] OR;  Service: Vascular;  Laterality: Right;  . CORONARY BALLOON ANGIOPLASTY N/A 12/03/2019   Procedure: CORONARY BALLOON ANGIOPLASTY;  Surgeon: Corky Crafts, MD;  Location: [facility] INVASIVE CV LAB;  Service: Cardiovascular;  Laterality: N/A;  . CORONARY STENT INTERVENTION N/A 12/03/2019   Procedure: CORONARY STENT INTERVENTION;  Surgeon: Corky Crafts, MD;  Location: [facility] INVASIVE CV LAB;  Service: Cardiovascular;  Laterality: N/A;  . ENDARTERECTOMY FEMORAL Right 08/18/2017   Procedure: ENDARTERECTOMY RIGHT  SUPERFICIAL FEMORAL/PROFUNDA;  Surgeon: Maeola Harmanain, Brandon Christopher, MD;  [facility];  Service: Vascular;  Laterality: Right;  . FEMORAL-POPLITEAL BYPASS GRAFT Right 08/18/2017   Procedure: BYPASS GRAFT RIGHT COMMON FEMORAL TO ABOVE KNEE POPLITEAL ARTERY USING RIGHT REVERSED GREAT SAPHENOUS VEIN;   Surgeon: Maeola Harmanain, Brandon Christopher, MD;  [facility];  Service: Vascular;  Laterality: Right;  . LEFT HEART CATH AND CORONARY ANGIOGRAPHY N/A 12/03/2019   Procedure: LEFT HEART CATH AND CORONARY ANGIOGRAPHY;  Surgeon: Corky CraftsVaranasi, Jayadeep S, MD;  [facility];  Service: Cardiovascular;  Laterality: N/A;  . LOWER EXTREMITY ANGIOGRAPHY Right 08/17/2017   Procedure: Lower Extremity Angiography;  Surgeon: Maeola Harmanain, Brandon Christopher, MD;  [facility];  Service: Cardiovascular;  Laterality: Right;  . PERIPHERAL VASCULAR BALLOON ANGIOPLASTY Right 08/17/2017   Procedure: PERIPHERAL VASCULAR BALLOON ANGIOPLASTY;  Surgeon: Maeola Harmanain, Brandon Christopher, MD;  [facility];  Service: Cardiovascular;  Laterality: Right;  SFA UNABLE TO CROSS  . VEIN HARVEST Right 08/18/2017   Procedure: VEIN HARVEST RIGHT GREAT SAPHENOUS;  Surgeon: Maeola Harmanain, Brandon Christopher, MD;  [facility];  Service: Vascular;  Laterality: Right;  . WOUND DEBRIDEMENT Right 08/18/2017   Procedure: DEBRIDEMENT WOUND RIGHT FOOT;  Surgeon: Maeola Harmanain, Brandon Christopher, MD;  [facility];  Service: Vascular;  Laterality: Right;    Current Outpatient Medications  Medication Sig Dispense Refill  . Albuterol Sulfate 108 (90 Base) MCG/ACT AEPB Inhale 90 mcg into the lungs.    Marland Kitchen. aspirin 81 MG chewable tablet Chew 1 tablet (81 mg total) by mouth daily.    Marland Kitchen. BREZTRI AEROSPHERE 160-9-4.8 MCG/ACT AERO Inhale 2 puffs into the lungs 2 (two) times daily.    . hydrochlorothiazide (MICROZIDE) 12.5 MG capsule Take 2 capsules (25 mg total) by mouth daily. 120 capsule 3  . losartan (COZAAR) 25 MG tablet Take 1 tablet (25 mg total) by mouth daily. 30 tablet 11  . metFORMIN (GLUCOPHAGE) 500 MG tablet Take 1 tablet (500 mg total) by mouth 2 (two) times daily with a meal. 120 tablet 3  . metoprolol tartrate (LOPRESSOR) 25 MG tablet Take 1 tablet (25 mg total) by mouth 2 (two) times daily. 120 tablet 3  . Multiple  Vitamins-Minerals (CENTRUM SILVER 50+MEN PO) Take by mouth.    . rosuvastatin (CRESTOR) 40 MG tablet Take 1 tablet (40 mg total) by mouth daily. 60 tablet 3  . ticagrelor (BRILINTA) 90 MG TABS tablet Take 1 tablet (90 mg total) by mouth 2 (two) times daily. 120 tablet 3  . traZODone (DESYREL) 50 MG tablet TAKE 1 TO 2 TABLETS BY MOUTH EVERY DAY AT BEDTIME AS NEEDED FOR INSOMNIA     No current facility-administered medications for this visit.   Allergies:  Penicillins and Clindamycin   Social History: The patient  reports that he has quit smoking. He has never used smokeless tobacco. He reports previous alcohol use. He reports that he does not use drugs.   Family History: The patient's family history is not on file. He was adopted.   ROS:  Please see the history of present illness. Otherwise, complete review of systems is positive for none.  All other systems are reviewed and negative.   Physical Exam: VS:  BP (!) 148/72   Pulse 74   Ht 6' (1.829 m)   Wt 271 lb 6.4 oz (123.1 kg)   SpO2 97%   BMI 36.81 kg/m , BMI Body mass index is 36.81 kg/m.  Wt Readings from Last 3 Encounters:  12/13/19 271 lb 6.4 oz (123.1 kg)  12/06/19 277 lb (125.6 kg)  12/03/19 277 lb (125.6 kg)    General: Patient appears comfortable at rest. Neck: Supple, no elevated JVP or carotid bruits, no thyromegaly. Lungs: Clear to auscultation, prolonged expiratory phase, nonlabored breathing at rest. Cardiac: Regular rate and rhythm, no S3 or significant systolic murmur, no pericardial rub. Extremities: No pitting edema, distal pulses 2+. Skin: Warm and dry. Musculoskeletal: No kyphosis. Neuropsychiatric: Alert and oriented x3, affect grossly appropriate.  ECG:  December 06, 2019: EKG shows sinus rhythm with first-degree AV block, incomplete right bundle branch block, left anterior fascicular block rate of 68, cannot rule out anteroseptal infarct, age undetermined.  Recent Labwork: 12/01/2019: ALT 22; AST  20 12/06/2019: B Natriuretic Peptide 59.0; BUN 23; Creatinine, Ser 1.06; Hemoglobin 13.8; Platelets 150; Potassium 3.4; Sodium 126     Component Value Date/Time   CHOL 103 12/02/2019 0926   TRIG 130 12/02/2019 0926   HDL 38 (L) 12/02/2019 0926   CHOLHDL 2.7 12/02/2019 0926   VLDL 26 12/02/2019 0926   LDLCALC 39 12/02/2019 0926    Other Studies Reviewed Today: Echocardiogram 12/02/2019:  1. Left ventricular ejection fraction, by estimation, is 65 to 70%. The  left ventricle has normal function. The left ventricle has no regional  wall motion abnormalities. Left ventricular diastolic parameters were  normal.  2. Right ventricular systolic function is normal. The right ventricular  size is normal. There is normal pulmonary artery systolic pressure.  3. The mitral valve is normal in structure. Trivial mitral valve  regurgitation. No evidence of mitral stenosis.  4. The aortic valve is grossly normal. Aortic valve regurgitation is not  visualized. No aortic stenosis is present.  5. The inferior vena cava is normal in size with greater than 50%  respiratory variability, suggesting right atrial pressure of 3 mmHg.   LHC 12/03/2019:   High bifurcation of the posterior descending artery and posterior lateral artery in the mid RCA. RCA lesion, right at this bifurcation, is 99% stenosed. A drug-eluting stent was successfully placed using a STENT RESOLUTE ONYX 2.5X30, postdilated with a 2.75 balloon, including final kissing balloon angioplasty.  Post intervention, there is a 0% residual stenosis.  RPAV lesion is 99% stenosed. Thrombectomy was performed. Final, kissing balloon angioplasty was performed using a BALLOON SAPPHIRE 2.25X15.  Post intervention, there is a 40% residual stenosis.  Prox LAD to Mid LAD lesion is 25% stenosed.  Prox RCA lesion is 25% stenosed.  The left ventricular systolic function is normal.  LV end diastolic pressure is normal.  The left ventricular  ejection fraction is 55-65% by visual estimate.  There is no aortic valve stenosis.  Balloon angioplasty was performed using a BALLOON SAPPHIRE 2.25X15.  A drug-eluting stent was successfully placed using a STENT RESOLUTE ONYX 2.5X30.  Continue aggressive secondary prevention. He will need dual antiplatelet therapy for a year. High-dose statin as well.    Diagnostic Dominance: Right  Intervention       Assessment and Plan:  1. Non-ST elevation (NSTEMI) myocardial infarction (HCC)   2. Essential hypertension   3. Mixed hyperlipidemia    1. Non-ST elevation (NSTEMI) myocardial infarction [facility]) Recent NSTEMI with DES to mid RCA and balloon angioplasty to RPAV.  Initial stenosis 99% with residual stenosis of 40% status post thrombectomy.  Denies any chest pain, pressure, tightness, neck, arm, back or jaw pain status post stent placement and angioplasty.  Continue aspirin 81 mg, Brilinta 90 mg p.o. twice daily.  Metoprolol 25 mg p.o. twice daily.  Add sublingual nitroglycerin 0.4 mg as needed for chest pain  2. Essential hypertension Blood pressure elevated today at 148/72.  Patient stated he is getting similar readings at home.  Increase losartan to 50 mg daily.  Continue HCTZ 25 mg daily, continue metoprolol 25 mg p.o. twice daily.  Get follow-up BMP and magnesium status post increasing losartan dosage  3.  Hyperlipidemia Continue Crestor 40 mg daily.  Needs follow-up FLP and LFTs in 6 to 8 weeks  Medication Adjustments/Labs and Tests Ordered: Current medicines are reviewed at length with the patient today.  Concerns regarding medicines are outlined above.   Disposition: Follow-up with Dr. Flora Lipps or APP 3 months  Signed, Rennis Harding, NP 12/13/2019 2:27 PM    [facility] [address] [place], [place], [place] [address] Phone: [phone]; Fax: [phone]

## 2019-12-13 NOTE — Patient Instructions (Signed)
Medication Instructions:   Increase Losartan to 50mg  daily.  Nitroglycerin as needed for angina / chest pain.  Continue all other medications.    Labwork:  BMET, Magnesium - due in 2 weeks (around 12/27/2019).  FLP, LFT - due in 6-8 weeks (around 10/7 - 02/07/2020).    Reminder:  Nothing to eat or drink after 12 midnight prior to labs.  Office will contact with results via phone or letter.    Testing/Procedures: none  Follow-Up: 3 months   Any Other Special Instructions Will Be Listed Below (If Applicable).  If you need a refill on your cardiac medications before your next appointment, please call your pharmacy.

## 2019-12-13 NOTE — Telephone Encounter (Signed)
Unable to reach pt or leave a message  

## 2019-12-27 DIAGNOSIS — Z0000 Encounter for general adult medical examination without abnormal findings: Secondary | ICD-10-CM | POA: Diagnosis not present

## 2019-12-27 DIAGNOSIS — L89892 Pressure ulcer of other site, stage 2: Secondary | ICD-10-CM | POA: Diagnosis not present

## 2019-12-27 DIAGNOSIS — R69 Illness, unspecified: Secondary | ICD-10-CM | POA: Diagnosis not present

## 2019-12-27 DIAGNOSIS — R7303 Prediabetes: Secondary | ICD-10-CM | POA: Diagnosis not present

## 2019-12-27 DIAGNOSIS — Z6835 Body mass index (BMI) 35.0-35.9, adult: Secondary | ICD-10-CM | POA: Diagnosis not present

## 2019-12-27 DIAGNOSIS — L97319 Non-pressure chronic ulcer of right ankle with unspecified severity: Secondary | ICD-10-CM | POA: Diagnosis not present

## 2019-12-27 DIAGNOSIS — R7301 Impaired fasting glucose: Secondary | ICD-10-CM | POA: Diagnosis not present

## 2019-12-27 DIAGNOSIS — Z712 Person consulting for explanation of examination or test findings: Secondary | ICD-10-CM | POA: Diagnosis not present

## 2019-12-27 DIAGNOSIS — I10 Essential (primary) hypertension: Secondary | ICD-10-CM | POA: Diagnosis not present

## 2019-12-27 DIAGNOSIS — J449 Chronic obstructive pulmonary disease, unspecified: Secondary | ICD-10-CM | POA: Diagnosis not present

## 2020-01-01 DIAGNOSIS — Z0001 Encounter for general adult medical examination with abnormal findings: Secondary | ICD-10-CM | POA: Diagnosis not present

## 2020-01-01 DIAGNOSIS — D696 Thrombocytopenia, unspecified: Secondary | ICD-10-CM | POA: Diagnosis not present

## 2020-01-01 DIAGNOSIS — R7301 Impaired fasting glucose: Secondary | ICD-10-CM | POA: Diagnosis not present

## 2020-01-01 DIAGNOSIS — E782 Mixed hyperlipidemia: Secondary | ICD-10-CM | POA: Diagnosis not present

## 2020-01-01 DIAGNOSIS — I7025 Atherosclerosis of native arteries of other extremities with ulceration: Secondary | ICD-10-CM | POA: Diagnosis not present

## 2020-01-01 DIAGNOSIS — I10 Essential (primary) hypertension: Secondary | ICD-10-CM | POA: Diagnosis not present

## 2020-01-01 DIAGNOSIS — Z89421 Acquired absence of other right toe(s): Secondary | ICD-10-CM | POA: Diagnosis not present

## 2020-01-01 DIAGNOSIS — L97319 Non-pressure chronic ulcer of right ankle with unspecified severity: Secondary | ICD-10-CM | POA: Diagnosis not present

## 2020-01-01 DIAGNOSIS — J449 Chronic obstructive pulmonary disease, unspecified: Secondary | ICD-10-CM | POA: Diagnosis not present

## 2020-01-01 DIAGNOSIS — E7849 Other hyperlipidemia: Secondary | ICD-10-CM | POA: Diagnosis not present

## 2020-01-01 DIAGNOSIS — I739 Peripheral vascular disease, unspecified: Secondary | ICD-10-CM | POA: Diagnosis not present

## 2020-01-01 DIAGNOSIS — R7303 Prediabetes: Secondary | ICD-10-CM | POA: Diagnosis not present

## 2020-01-14 DIAGNOSIS — L89892 Pressure ulcer of other site, stage 2: Secondary | ICD-10-CM | POA: Diagnosis not present

## 2020-01-14 DIAGNOSIS — Z712 Person consulting for explanation of examination or test findings: Secondary | ICD-10-CM | POA: Diagnosis not present

## 2020-01-14 DIAGNOSIS — R69 Illness, unspecified: Secondary | ICD-10-CM | POA: Diagnosis not present

## 2020-01-14 DIAGNOSIS — L97319 Non-pressure chronic ulcer of right ankle with unspecified severity: Secondary | ICD-10-CM | POA: Diagnosis not present

## 2020-01-14 DIAGNOSIS — Z0000 Encounter for general adult medical examination without abnormal findings: Secondary | ICD-10-CM | POA: Diagnosis not present

## 2020-01-14 DIAGNOSIS — I10 Essential (primary) hypertension: Secondary | ICD-10-CM | POA: Diagnosis not present

## 2020-01-14 DIAGNOSIS — J449 Chronic obstructive pulmonary disease, unspecified: Secondary | ICD-10-CM | POA: Diagnosis not present

## 2020-01-14 DIAGNOSIS — Z6835 Body mass index (BMI) 35.0-35.9, adult: Secondary | ICD-10-CM | POA: Diagnosis not present

## 2020-01-22 ENCOUNTER — Encounter: Payer: Self-pay | Admitting: *Deleted

## 2020-01-24 ENCOUNTER — Telehealth: Payer: Self-pay | Admitting: *Deleted

## 2020-01-24 NOTE — Telephone Encounter (Signed)
Reports that his PCP has already addressed the lab work by stopping hctz and advised him to increase K+ rich foods and repeat lab work at the end of this week. Advised to continue plan PCP has given. Verbalized understanding.

## 2020-01-24 NOTE — Telephone Encounter (Signed)
-----   Message from Netta Neat., NP sent at 01/23/2020  8:13 PM EDT ----- Labs show some improvement in renal function but still decreased. Potassium remains low at 3.1. Decrease HCTZ to 12.5 mg only once per day and give him potassium 20 Meq daily for 10 days. Get a follow up BMP and Mag in two weeks after changes.  Thanks

## 2020-01-28 DIAGNOSIS — J449 Chronic obstructive pulmonary disease, unspecified: Secondary | ICD-10-CM | POA: Diagnosis not present

## 2020-01-28 DIAGNOSIS — Z0000 Encounter for general adult medical examination without abnormal findings: Secondary | ICD-10-CM | POA: Diagnosis not present

## 2020-01-28 DIAGNOSIS — L97319 Non-pressure chronic ulcer of right ankle with unspecified severity: Secondary | ICD-10-CM | POA: Diagnosis not present

## 2020-01-28 DIAGNOSIS — L89892 Pressure ulcer of other site, stage 2: Secondary | ICD-10-CM | POA: Diagnosis not present

## 2020-01-28 DIAGNOSIS — R944 Abnormal results of kidney function studies: Secondary | ICD-10-CM | POA: Diagnosis not present

## 2020-01-28 DIAGNOSIS — I10 Essential (primary) hypertension: Secondary | ICD-10-CM | POA: Diagnosis not present

## 2020-01-28 DIAGNOSIS — R69 Illness, unspecified: Secondary | ICD-10-CM | POA: Diagnosis not present

## 2020-01-28 DIAGNOSIS — Z712 Person consulting for explanation of examination or test findings: Secondary | ICD-10-CM | POA: Diagnosis not present

## 2020-03-17 NOTE — Progress Notes (Signed)
Cardiology Office Note  Date: 03/18/2020   ID: Martine Trageser, DOB [birth year], MRN [id number]  PCP:  Benita Stabile, MD  Cardiologist:  Reatha Harps, MD Electrophysiologist:  None   Chief Complaint: Follow-up hospitalization NSTEMI, hypertension  History of Present Illness: Konstantinos Cordoba is a 74 y.o. male with a history of COPD, PVD status post bypass of the right leg, hypertension, HTN.  Presented to [facility] emergency room 12/01/2019 with chest tightness, lightheadedness, dizziness, shortness of breath.  Describes it as a tight sensation on the left side of the chest with burning in his left arm associated with shortness of breath, nausea, lightheadedness.  He denies any cardiac history.  No prior stress test or cardiac catheterization.  He was admitted to [facility] for ACS/NSTEMI  Underwent cardiac catheterization 12/03/2019 revealing lesion of mid to distal RCA with DES to RCA, RPDA, and posterior lateral branch, underwent PCI to this area RPAV lesion  99% stenosed. Thrombectomy was performed. Final, kissing balloon angioplasty was performed using a BALLOON SAPPHIRE 2.25X15. Right radial cath site was clean and dry.  He was discharged on aspirin and Brilinta.  He was started on beta-blocker and high intensity statin.  His blood pressure had been elevated during admission.  His enalapril was changed to losartan.  He was continue metoprolol and HCTZ.  He was here at last visit for hospital follow-up status post recent NSTEMI December 01, 2019 with DES to RCA and balloon angioplasty to RPAV lesion, thrombectomy performed with balloon angioplasty.  He denied any chest pain, chest pressure, chest tightness, neck, arm, back, or jaw pain.  He stated he did have some unusual feeling in his chest and went to the emergency room and was found to be hyponatremic.  He was having chronic dyspnea from COPD.  He denied any bleeding.  He was tolerating his new medications well.  Blood pressure continued to  be elevated at 148/72.  He stated this was approximately what he had been getting at home since discharge from the hospital.  He denied any PND, orthopnea, palpitations or arrhythmias, orthostatic symptoms, CVA or TIA-like symptoms.  No claudication-like symptoms, DVT or PE-like symptoms.   He is here for 37-month follow-up today patient states she has been doing very well in the interim since last visit.  His vital signs have been stable.  He denies any anginal or exertional symptoms, palpitations or arrhythmias, orthostatic symptoms, CVA or TIA-like symptoms, PND, orthopnea, claudication-like symptoms, DVT or PE-like symptoms, or lower extremity edema.  Denies any blood in stool or urine.  States he has occasional random shortness of breath due to taking Brilinta.  States he will drink caffeine in the form of coffee or soda to help relieve the symptoms.  Past Medical History:  Diagnosis Date  . Abdominal aortic aneurysm    no AAA on [place] in 2020  . Chronic obstructive pulmonary disease   . HLD (hyperlipidemia)   . HTN (hypertension)   . Peripheral artery disease    s/p R fem pop // s/p R transmet amp    Past Surgical History:  Procedure Laterality Date  . ABDOMINAL AORTOGRAM N/A 08/17/2017   Procedure: ABDOMINAL AORTOGRAM;  Surgeon: Maeola Harman, MD;  Location: [facility] INVASIVE CV LAB;  Service: Cardiovascular;  Laterality: N/A;  . AMPUTATION Right 08/21/2017   Procedure: TRANSMETATARSAL AMPUTATION RIGHT FOOT;  Surgeon: Nadara Mustard, MD;  Location: [facility] OR;  Service: Orthopedics;  Laterality: Right;  . AMPUTATION TOE Right 08/18/2017  Procedure: AMPUTATION RIGHT SECOND AND THIRD TOES;  Surgeon: Maeola Harmanain, Brandon Christopher, MD;  [facility];  Service: Vascular;  Laterality: Right;  . CORONARY BALLOON ANGIOPLASTY N/A 12/03/2019   Procedure: CORONARY BALLOON ANGIOPLASTY;  Surgeon: Corky CraftsVaranasi, Jayadeep S, MD;  [facility];  Service: Cardiovascular;  Laterality: N/A;  .  CORONARY STENT INTERVENTION N/A 12/03/2019   Procedure: CORONARY STENT INTERVENTION;  Surgeon: Corky CraftsVaranasi, Jayadeep S, MD;  [facility]: [facility] INVAS[facility] CV LAB;  Service: Cardiovascular;  Laterality: N/A;  . ENDARTERECTOMY FEMORAL Right 08/18/2017   Procedure: ENDARTERECTOMY RIGHT SUPERFICIAL Alfredia FergusonFEMORAL/PROFUNDA;  Surgeon: Maeola Harmanain, Brandon Christopher, MD;  [facility];  Service: Vascular;  Laterality: Right;  . FEMORAL-POPLITEAL BYPASS GRAFT Right 08/18/2017   Procedure: BYPASS GRAFT RIGHT COMMON FEMORAL TO ABOVE KNEE POPLITEAL ARTERY USING RIGHT REVERSED GREAT SAPHENOUS VEIN;  Surgeon: Maeola Harmanain, Brandon Christopher, MD;  [facility];  Service: Vascular;  Laterality: Right;  . LEFT HEART CATH AND CORONARY ANGIOGRAPHY N/A 12/03/2019   Procedure: LEFT HEART CATH AND CORONARY ANGIOGRAPHY;  Surgeon: Corky CraftsVaranasi, Jayadeep S, MD;  [facility];  Service: Cardiovascular;  Laterality: N/A;  . LOWER EXTREMITY ANGIOGRAPHY Right 08/17/2017   Procedure: Lower Extremity Angiography;  Surgeon: Maeola Harmanain, Brandon Christopher, MD;  [facility];  Service: Cardiovascular;  Laterality: Right;  . PERIPHERAL VASCULAR BALLOON ANGIOPLASTY Right 08/17/2017   Procedure: PERIPHERAL VASCULAR BALLOON ANGIOPLASTY;  Surgeon: Maeola Harmanain, Brandon Christopher, MD;  [facility];  Service: Cardiovascular;  Laterality: Right;  SFA UNABLE TO CROSS  . VEIN HARVEST Right 08/18/2017   Procedure: VEIN HARVEST RIGHT GREAT SAPHENOUS;  Surgeon: Maeola Harmanain, Brandon Christopher, MD;  [facility];  Service: Vascular;  Laterality: Right;  . WOUND DEBRIDEMENT Right 08/18/2017   Procedure: DEBRIDEMENT WOUND RIGHT FOOT;  Surgeon: Maeola Harmanain, Brandon Christopher, MD;  [facility];  Service: Vascular;  Laterality: Right;    Current Outpatient Medications  Medication Sig Dispense Refill  . Albuterol Sulfate 108 (90 Base) MCG/ACT AEPB Inhale 90 mcg into the lungs.    Marland Kitchen. aspirin 81 MG chewable tablet Chew 1 tablet (81 mg total) by mouth daily.     Marland Kitchen. BREZTRI AEROSPHERE 160-9-4.8 MCG/ACT AERO Inhale 2 puffs into the lungs 2 (two) times daily.    . hydrochlorothiazide (HYDRODIURIL) 12.5 MG tablet Take 12.5 mg by mouth daily.     Marland Kitchen. losartan (COZAAR) 50 MG tablet Take 1 tablet (50 mg total) by mouth daily. 30 tablet 6  . metFORMIN (GLUCOPHAGE) 500 MG tablet Take 1 tablet (500 mg total) by mouth 2 (two) times daily with a meal. 120 tablet 3  . metoprolol tartrate (LOPRESSOR) 25 MG tablet Take 1 tablet (25 mg total) by mouth 2 (two) times daily. 120 tablet 3  . Multiple Vitamins-Minerals (CENTRUM SILVER 50+MEN PO) Take by mouth.    . nitroGLYCERIN (NITROSTAT) 0.4 MG SL tablet Place 1 tablet (0.4 mg total) under the tongue every 5 (five) minutes as needed for chest pain. 25 tablet 3  . rosuvastatin (CRESTOR) 40 MG tablet Take 1 tablet (40 mg total) by mouth daily. 60 tablet 3  . ticagrelor (BRILINTA) 90 MG TABS tablet Take 1 tablet (90 mg total) by mouth 2 (two) times daily. 120 tablet 3  . traZODone (DESYREL) 50 MG tablet TAKE 1 TO 2 TABLETS BY MOUTH EVERY DAY AT BEDTIME AS NEEDED FOR INSOMNIA     No current facility-administered medications for this visit.   Allergies:  Penicillins and Clindamycin   Social History: The  patient  reports that he has quit smoking. He has never used smokeless tobacco. He reports previous alcohol use. He reports that he does not use drugs.   Family History: The patient's family history is not on file. He was adopted.   ROS:  Please see the history of present illness. Otherwise, complete review of systems is positive for none.  All other systems are reviewed and negative.   Physical Exam: VS:  BP 128/62   Pulse (!) 58   Ht 6' (1.829 m)   Wt 261 lb 9.6 oz (118.7 kg)   SpO2 98%   BMI 35.48 kg/m , BMI Body mass index is 35.48 kg/m.  Wt Readings from Last 3 Encounters:  03/18/20 261 lb 9.6 oz (118.7 kg)  12/13/19 271 lb 6.4 oz (123.1 kg)  12/06/19 277 lb (125.6 kg)    General: Patient appears  comfortable at rest. Neck: Supple, no elevated JVP or carotid bruits, no thyromegaly. Lungs: Clear to auscultation, prolonged expiratory phase, nonlabored breathing at rest. Cardiac: Regular rate and rhythm, no S3 or significant systolic murmur, no pericardial rub. Extremities: No pitting edema, distal pulses 2+. Skin: Warm and dry. Musculoskeletal: No kyphosis. Neuropsychiatric: Alert and oriented x3, affect grossly appropriate.  ECG:  December 06, 2019: EKG shows sinus rhythm with first-degree AV block, incomplete right bundle branch block, left anterior fascicular block rate of 68, cannot rule out anteroseptal infarct, age undetermined.  Recent Labwork: 12/01/2019: ALT 22; AST 20 12/06/2019: B Natriuretic Peptide 59.0; BUN 23; Creatinine, Ser 1.06; Hemoglobin 13.8; Platelets 150; Potassium 3.4; Sodium 126     Component Value Date/Time   CHOL 103 12/02/2019 0926   TRIG 130 12/02/2019 0926   HDL 38 (L) 12/02/2019 0926   CHOLHDL 2.7 12/02/2019 0926   VLDL 26 12/02/2019 0926   LDLCALC 39 12/02/2019 0926    Other Studies Reviewed Today:   Echocardiogram 12/02/2019:  1. Left ventricular ejection fraction, by estimation, is 65 to 70%. The  left ventricle has normal function. The left ventricle has no regional  wall motion abnormalities. Left ventricular diastolic parameters were  normal.  2. Right ventricular systolic function is normal. The right ventricular  size is normal. There is normal pulmonary artery systolic pressure.  3. The mitral valve is normal in structure. Trivial mitral valve  regurgitation. No evidence of mitral stenosis.  4. The aortic valve is grossly normal. Aortic valve regurgitation is not  visualized. No aortic stenosis is present.  5. The inferior vena cava is normal in size with greater than 50%  respiratory variability, suggesting right atrial pressure of 3 mmHg.   LHC 12/03/2019:   High bifurcation of the posterior descending artery and posterior  lateral artery in the mid RCA. RCA lesion, right at this bifurcation, is 99% stenosed. A drug-eluting stent was successfully placed using a STENT RESOLUTE ONYX 2.5X30, postdilated with a 2.75 balloon, including final kissing balloon angioplasty.  Post intervention, there is a 0% residual stenosis.  RPAV lesion is 99% stenosed. Thrombectomy was performed. Final, kissing balloon angioplasty was performed using a BALLOON SAPPHIRE 2.25X15.  Post intervention, there is a 40% residual stenosis.  Prox LAD to Mid LAD lesion is 25% stenosed.  Prox RCA lesion is 25% stenosed.  The left ventricular systolic function is normal.  LV end diastolic pressure is normal.  The left ventricular ejection fraction is 55-65% by visual estimate.  There is no aortic valve stenosis.  Balloon angioplasty was performed using a BALLOON SAPPHIRE 2.25X15.  A drug-eluting  stent was successfully placed using a STENT RESOLUTE ONYX 2.5X30.  Continue aggressive secondary prevention. He will need dual antiplatelet therapy for a year. High-dose statin as well.    Diagnostic Dominance: Right  Intervention       Assessment and Plan:   1. Non-ST elevation (NSTEMI) myocardial infarction [facility]) Recent NSTEMI with DES to mid RCA and balloon angioplasty to RPAV.  Initial stenosis 99% with residual stenosis of 40% status post thrombectomy.  Denies recent anginal or exertional symptoms.  Continue aspirin 81 mg, Brilinta 90 mg p.o. twice daily.  Metoprolol 25 mg p.o. twice daily.  Continue sublingual nitroglycerin 0.4 mg as needed for chest pain  2. Essential hypertension Blood pressure well controlled on current therapy at 128/62.  Continue losartan  50 mg daily.  Continue HCTZ 12.5 mg p.o. daily.  Continue metoprolol 25 mg p.o. twice daily.  Complete metabolic panel on 12/27/2019: Glucose 104, BUN 30, creatinine 2.07, GFR 31, potassium 3.1  3.  Hyperlipidemia Continue Crestor 40 mg daily. Recent lipid panel  12/27/2019: Total cholesterol 81, triglycerides 139, HDL 29, LDL 28.  Medication Adjustments/Labs and Tests Ordered: Current medicines are reviewed at length with the patient today.  Concerns regarding medicines are outlined above.   Disposition: Follow-up with Dr. Flora Lipps or APP 6 months  Signed, Rennis Harding, NP 03/18/2020 2:22 PM    [facility] at [facility] [address] [place], [place], [place] [address] Phone: [phone]; Fax: [phone]

## 2020-03-18 ENCOUNTER — Ambulatory Visit: Payer: Medicare HMO | Admitting: Family Medicine

## 2020-03-18 ENCOUNTER — Encounter: Payer: Self-pay | Admitting: Family Medicine

## 2020-03-18 VITALS — BP 128/62 | HR 58 | Ht 72.0 in | Wt 261.6 lb

## 2020-03-18 DIAGNOSIS — E782 Mixed hyperlipidemia: Secondary | ICD-10-CM | POA: Diagnosis not present

## 2020-03-18 DIAGNOSIS — I214 Non-ST elevation (NSTEMI) myocardial infarction: Secondary | ICD-10-CM | POA: Diagnosis not present

## 2020-03-18 DIAGNOSIS — I10 Essential (primary) hypertension: Secondary | ICD-10-CM | POA: Diagnosis not present

## 2020-03-18 NOTE — Patient Instructions (Signed)
Medication Instructions:  Continue all current medications.   Labwork: none  Testing/Procedures: none  Follow-Up: 6 months   Any Other Special Instructions Will Be Listed Below (If Applicable).   If you need a refill on your cardiac medications before your next appointment, please call your pharmacy.  

## 2020-06-03 ENCOUNTER — Other Ambulatory Visit: Payer: Self-pay | Admitting: *Deleted

## 2020-06-03 MED ORDER — LOSARTAN POTASSIUM 50 MG PO TABS: 50.0000 mg | ORAL_TABLET | Freq: Every day | ORAL | 6 refills | Status: DC

## 2020-07-14 ENCOUNTER — Other Ambulatory Visit: Payer: Self-pay | Admitting: Cardiology

## 2020-07-25 ENCOUNTER — Other Ambulatory Visit: Payer: Self-pay | Admitting: Physician Assistant

## 2020-07-25 DIAGNOSIS — E782 Mixed hyperlipidemia: Secondary | ICD-10-CM | POA: Diagnosis not present

## 2020-07-25 DIAGNOSIS — Z9889 Other specified postprocedural states: Secondary | ICD-10-CM

## 2020-07-25 DIAGNOSIS — R944 Abnormal results of kidney function studies: Secondary | ICD-10-CM | POA: Diagnosis not present

## 2020-07-25 DIAGNOSIS — I739 Peripheral vascular disease, unspecified: Secondary | ICD-10-CM | POA: Diagnosis not present

## 2020-07-25 DIAGNOSIS — J449 Chronic obstructive pulmonary disease, unspecified: Secondary | ICD-10-CM | POA: Diagnosis not present

## 2020-07-25 DIAGNOSIS — Z6835 Body mass index (BMI) 35.0-35.9, adult: Secondary | ICD-10-CM | POA: Diagnosis not present

## 2020-07-25 DIAGNOSIS — I10 Essential (primary) hypertension: Secondary | ICD-10-CM | POA: Diagnosis not present

## 2020-07-25 DIAGNOSIS — I214 Non-ST elevation (NSTEMI) myocardial infarction: Secondary | ICD-10-CM | POA: Diagnosis not present

## 2020-07-25 DIAGNOSIS — R69 Illness, unspecified: Secondary | ICD-10-CM | POA: Diagnosis not present

## 2020-07-29 DIAGNOSIS — E782 Mixed hyperlipidemia: Secondary | ICD-10-CM | POA: Diagnosis not present

## 2020-07-29 DIAGNOSIS — R7303 Prediabetes: Secondary | ICD-10-CM | POA: Diagnosis not present

## 2020-07-29 DIAGNOSIS — Z125 Encounter for screening for malignant neoplasm of prostate: Secondary | ICD-10-CM | POA: Diagnosis not present

## 2020-07-29 DIAGNOSIS — R944 Abnormal results of kidney function studies: Secondary | ICD-10-CM | POA: Diagnosis not present

## 2020-07-29 DIAGNOSIS — Z0001 Encounter for general adult medical examination with abnormal findings: Secondary | ICD-10-CM | POA: Diagnosis not present

## 2020-07-29 DIAGNOSIS — R7301 Impaired fasting glucose: Secondary | ICD-10-CM | POA: Diagnosis not present

## 2020-07-29 DIAGNOSIS — I10 Essential (primary) hypertension: Secondary | ICD-10-CM | POA: Diagnosis not present

## 2020-07-31 DIAGNOSIS — Z89421 Acquired absence of other right toe(s): Secondary | ICD-10-CM | POA: Diagnosis not present

## 2020-07-31 DIAGNOSIS — I10 Essential (primary) hypertension: Secondary | ICD-10-CM | POA: Diagnosis not present

## 2020-07-31 DIAGNOSIS — I7025 Atherosclerosis of native arteries of other extremities with ulceration: Secondary | ICD-10-CM | POA: Diagnosis not present

## 2020-07-31 DIAGNOSIS — R7303 Prediabetes: Secondary | ICD-10-CM | POA: Diagnosis not present

## 2020-07-31 DIAGNOSIS — Z0001 Encounter for general adult medical examination with abnormal findings: Secondary | ICD-10-CM | POA: Diagnosis not present

## 2020-07-31 DIAGNOSIS — E782 Mixed hyperlipidemia: Secondary | ICD-10-CM | POA: Diagnosis not present

## 2020-07-31 DIAGNOSIS — J449 Chronic obstructive pulmonary disease, unspecified: Secondary | ICD-10-CM | POA: Diagnosis not present

## 2020-07-31 DIAGNOSIS — D696 Thrombocytopenia, unspecified: Secondary | ICD-10-CM | POA: Diagnosis not present

## 2020-07-31 DIAGNOSIS — R7301 Impaired fasting glucose: Secondary | ICD-10-CM | POA: Diagnosis not present

## 2020-07-31 DIAGNOSIS — I739 Peripheral vascular disease, unspecified: Secondary | ICD-10-CM | POA: Diagnosis not present

## 2020-08-16 ENCOUNTER — Other Ambulatory Visit: Payer: Self-pay | Admitting: Cardiology

## 2020-08-21 ENCOUNTER — Other Ambulatory Visit: Payer: Self-pay | Admitting: Cardiology

## 2020-08-21 NOTE — Telephone Encounter (Signed)
This is a [place] pt °

## 2020-09-23 NOTE — Progress Notes (Signed)
CARDIOLOGY CONSULT NOTE       Patient ID: Jorge Clements MRN: [id number] DOB/AGE: 06-26-1945 75 y.o.  Admit date: (Not on file) Referring Physician: [place] Crane Primary Physician: Celene Squibb, MD Primary Cardiologist: [place]  Reason for Consultation: CAD    HPI:  75 y.o. new to me D/c from [facility] hospital 12/04/19 after having SEMI Troponin peak 188 TTE 12/02/19 EF 65-70% and trivial MR.  Cath by Dr Irish Lack 12/03/19 showed tight bifurcation lesion in mid to distal RCA / RV branch with left to right collaterals Had DES with thrombectomy with good result No significant left sided disease.   History of HTN, HLD, COPD and PVD. Post right fem-pop bypass and right transmet amputation He is followed by Dr Donzetta Matters [facility]  Most recent ABI 07/30/19 showed right ABI 0.76 and left 0.59.    No angina.  Brilinta causing dyspnea and insomnia which is very bothersome to him   ROS All other systems reviewed and negative except as noted above  Past Medical History:  Diagnosis Date   Abdominal aortic aneurysm    no AAA on [place] in 2020   Chronic obstructive pulmonary disease    HLD (hyperlipidemia)    HTN (hypertension)    Peripheral artery disease    s/p R fem pop // s/p R transmet amp    Family History  Adopted: Yes    Social History   Socioeconomic History   Marital status: Divorced    Spouse name: Not on file   Number of children: Not on file   Years of education: Not on file   Highest education level: Not on file  Occupational History   Not on file  Tobacco Use   Smoking status: Former Smoker   Smokeless tobacco: Never Used  Scientific laboratory technician Use: Former  Substance and Sexual Activity   Alcohol use: Not Currently   Drug use: No   Sexual activity: Not on file  Other Topics Concern   Not on file  Social History Narrative   Not on file   Social Determinants of Health   Financial Resource Strain: Not on file  Food Insecurity: Not on file  Transportation Needs: Not on file   Physical Activity: Not on file  Stress: Not on file  Social Connections: Not on file  Intimate Partner Violence: Not on file    Past Surgical History:  Procedure Laterality Date   ABDOMINAL AORTOGRAM N/A 08/17/2017   Procedure: ABDOMINAL AORTOGRAM;  Surgeon: Waynetta Sandy, MD;  Location: [place] CV LAB;  Service: Cardiovascular;  Laterality: N/A;   AMPUTATION Right 08/21/2017   Procedure: TRANSMETATARSAL AMPUTATION RIGHT FOOT;  Surgeon: Newt Minion, MD;  Location: [place];  Service: Orthopedics;  Laterality: Right;   AMPUTATION TOE Right 08/18/2017   Procedure: AMPUTATION RIGHT SECOND AND THIRD TOES;  Surgeon: Waynetta Sandy, MD;  Location: [place];  Service: Vascular;  Laterality: Right;   CORONARY BALLOON ANGIOPLASTY N/A 12/03/2019   Procedure: CORONARY BALLOON ANGIOPLASTY;  Surgeon: Jettie Booze, MD;  Location: [place] CV LAB;  Service: Cardiovascular;  Laterality: N/A;   CORONARY STENT INTERVENTION N/A 12/03/2019   Procedure: CORONARY STENT INTERVENTION;  Surgeon: Jettie Booze, MD;  Location: [place] CV LAB;  Service: Cardiovascular;  Laterality: N/A;   ENDARTERECTOMY FEMORAL Right 08/18/2017   Procedure: ENDARTERECTOMY RIGHT SUPERFICIAL Jerene Canny;  Surgeon: Waynetta Sandy, MD;  Location: [place];  Service: Vascular;  Laterality: Right;   FEMORAL-POPLITEAL BYPASS GRAFT Right  08/18/2017   Procedure: BYPASS GRAFT RIGHT COMMON FEMORAL TO ABOVE KNEE POPLITEAL ARTERY USING RIGHT REVERSED GREAT SAPHENOUS VEIN;  Surgeon: Waynetta Sandy, MD;  Location: [place];  Service: Vascular;  Laterality: Right;   LEFT HEART CATH AND CORONARY ANGIOGRAPHY N/A 12/03/2019   Procedure: LEFT HEART CATH AND CORONARY ANGIOGRAPHY;  Surgeon: Jettie Booze, MD;  Location: [place] CV LAB;  Service: Cardiovascular;  Laterality: N/A;   LOWER EXTREMITY ANGIOGRAPHY Right 08/17/2017   Procedure: Lower Extremity Angiography;  Surgeon: Waynetta Sandy, MD;  Location: [place] CV LAB;  Service: Cardiovascular;  Laterality: Right;   PERIPHERAL VASCULAR BALLOON ANGIOPLASTY Right 08/17/2017   Procedure: PERIPHERAL VASCULAR BALLOON ANGIOPLASTY;  Surgeon: Waynetta Sandy, MD;  Location: [place] CV LAB;  Service: Cardiovascular;  Laterality: Right;  SFA UNABLE TO CROSS   VEIN HARVEST Right 08/18/2017   Procedure: VEIN HARVEST RIGHT GREAT SAPHENOUS;  Surgeon: Waynetta Sandy, MD;  Location: [place];  Service: Vascular;  Laterality: Right;   WOUND DEBRIDEMENT Right 08/18/2017   Procedure: DEBRIDEMENT WOUND RIGHT FOOT;  Surgeon: Waynetta Sandy, MD;  Location: [place];  Service: Vascular;  Laterality: Right;      Current Outpatient Medications:    Albuterol Sulfate 108 (90 Base) MCG/ACT AEPB, Inhale 90 mcg into the lungs., Disp: , Rfl:    aspirin 81 MG chewable tablet, Chew 1 tablet (81 mg total) by mouth daily., Disp: , Rfl:    BREZTRI AEROSPHERE 160-9-4.8 MCG/ACT AERO, Inhale 2 puffs into the lungs 2 (two) times daily., Disp: , Rfl:    BRILINTA 90 MG TABS tablet, Take 1 tablet by mouth twice daily, Disp: 180 tablet, Rfl: 0   hydrochlorothiazide (HYDRODIURIL) 12.5 MG tablet, Take 12.5 mg by mouth daily. , Disp: , Rfl:    losartan (COZAAR) 50 MG tablet, Take 1 tablet (50 mg total) by mouth daily., Disp: 30 tablet, Rfl: 6   metFORMIN (GLUCOPHAGE) 500 MG tablet, Take 1 tablet (500 mg total) by mouth 2 (two) times daily with a meal., Disp: 120 tablet, Rfl: 3   metoprolol tartrate (LOPRESSOR) 25 MG tablet, Take 1 tablet by mouth twice daily, Disp: 180 tablet, Rfl: 1   Multiple Vitamins-Minerals (CENTRUM SILVER 50+MEN PO), Take by mouth., Disp: , Rfl:    nitroGLYCERIN (NITROSTAT) 0.4 MG SL tablet, Place 1 tablet (0.4 mg total) under the tongue every 5 (five) minutes as needed for chest pain., Disp: 25 tablet, Rfl: 3   rosuvastatin (CRESTOR) 40 MG tablet, Take 1 tablet by mouth once daily, Disp: 180 tablet, Rfl: 2    traZODone (DESYREL) 50 MG tablet, TAKE 1 TO 2 TABLETS BY MOUTH EVERY DAY AT BEDTIME AS NEEDED FOR INSOMNIA, Disp: , Rfl:     Physical Exam: There were no vitals taken for this visit.   Affect appropriate Healthy:  appears stated age 14: normal Neck supple with no adenopathy JVP normal no bruits no thyromegaly Lungs clear with no wheezing and good diaphragmatic motion Heart:  S1/S2 no murmur, no rub, gallop or click PMI normal Abdomen: benighn, BS positve, no tenderness, no AAA no bruit.  No HSM or HJR Post right fem pop bypass and right trans met amputation  No edema Neuro non-focal Skin warm and dry No muscular weakness   Labs:   Lab Results  Component Value Date   WBC 12.2 (H) 12/06/2019   HGB 13.8 12/06/2019   HCT 41.5 12/06/2019   MCV 89.1 12/06/2019   PLT 150 12/06/2019  No results for input(s): NA, K, CL, CO2, BUN, CREATININE, CALCIUM, PROT, BILITOT, ALKPHOS, ALT, AST, GLUCOSE in the last 168 hours.  Invalid input(s): LABALBU Lab Results  Component Value Date   TROPONINI <0.03 08/11/2017    Lab Results  Component Value Date   CHOL 103 12/02/2019   Lab Results  Component Value Date   HDL 38 (L) 12/02/2019   Lab Results  Component Value Date   LDLCALC 39 12/02/2019   Lab Results  Component Value Date   TRIG 130 12/02/2019   Lab Results  Component Value Date   CHOLHDL 2.7 12/02/2019   No results found for: LDLDIRECT    Radiology: No results found.  EKG: SR LAFB 12/06/19    ASSESSMENT AND PLAN:   1. CAD: SEMI with classic angina 12/03/19 DES to high bifurcation mid RCA at PDA/PLB No significant left sided disease preserved EF with left to right collaterals. Continue beta blocker statin DAT till September 2022 Substitute plavix for Brillinta given side effects   2. PVD: f/u [facility] Dr Donzetta Matters post right fem pop and right transmet amputation with moderate PVD based on ABI's done April 2021  3. HTN:   Well controlled.  Continue current medications  and low sodium Dash type diet.    4. HLD:  On statin LDL 28 labs by primary 12/27/19   5. DM:  On Glucophage A1c 6.4 Augst 2021 f/u primary   F/U Dr Donzetta Matters [facility] for PVD F/U cardiology 6 months  D/c Brilinta and start Plavix   Signed: Jenkins Rouge 09/23/2020, 2:02 PM

## 2020-09-29 ENCOUNTER — Encounter: Payer: Self-pay | Admitting: Cardiovascular Disease

## 2020-09-29 ENCOUNTER — Ambulatory Visit (INDEPENDENT_AMBULATORY_CARE_PROVIDER_SITE_OTHER): Payer: Medicare HMO | Admitting: Cardiovascular Disease

## 2020-09-29 ENCOUNTER — Other Ambulatory Visit: Payer: Self-pay

## 2020-09-29 VITALS — BP 160/78 | HR 86 | Ht 72.0 in | Wt 279.0 lb

## 2020-09-29 DIAGNOSIS — I2510 Atherosclerotic heart disease of native coronary artery without angina pectoris: Secondary | ICD-10-CM | POA: Diagnosis not present

## 2020-09-29 DIAGNOSIS — I739 Peripheral vascular disease, unspecified: Secondary | ICD-10-CM

## 2020-09-29 DIAGNOSIS — E782 Mixed hyperlipidemia: Secondary | ICD-10-CM

## 2020-09-29 DIAGNOSIS — I10 Essential (primary) hypertension: Secondary | ICD-10-CM

## 2020-09-29 MED ORDER — CLOPIDOGREL BISULFATE 75 MG PO TABS: 75.0000 mg | ORAL_TABLET | Freq: Every day | ORAL | 3 refills | Status: DC

## 2020-09-29 NOTE — Addendum Note (Signed)
Addended by: Kerney Elbe on: 09/29/2020 01:29 PM   Modules accepted: Orders

## 2020-09-29 NOTE — Patient Instructions (Signed)
Medication Instructions:  Your physician has recommended you make the following change in your medication:   Stop Taking Brilinta  Start Taking Plavix 75 mg Daily   *If you need a refill on your cardiac medications before your next appointment, please call your pharmacy*   Lab Work: NONE   If you have labs (blood work) drawn today and your tests are completely normal, you will receive your results only by: MyChart Message (if you have MyChart) OR A paper copy in the mail If you have any lab test that is abnormal or we need to change your treatment, we will call you to review the results.   Testing/Procedures: NONE    Follow-Up: At [facility], you and your health needs are our priority.  As part of our continuing mission to provide you with exceptional heart care, we have created designated Provider Care Teams.  These Care Teams include your primary Cardiologist (physician) and Advanced Practice Providers (APPs -  Physician Assistants and Nurse Practitioners) who all work together to provide you with the care you need, when you need it.  We recommend signing up for the patient portal called "MyChart".  Sign up information is provided on this After Visit Summary.  MyChart is used to connect with patients for Virtual Visits (Telemedicine).  Patients are able to view lab/test results, encounter notes, upcoming appointments, etc.  Non-urgent messages can be sent to your provider as well.   To learn more about what you can do with MyChart, go to [email or web address].    Your next appointment:   6 month(s)  The format for your next appointment:   In Person  Provider:   Charlton Haws, MD   Other Instructions Thank you for choosing [facility]!

## 2020-12-07 ENCOUNTER — Other Ambulatory Visit: Payer: Self-pay | Admitting: Family Medicine

## 2020-12-23 ENCOUNTER — Inpatient Hospital Stay (HOSPITAL_COMMUNITY)
Admission: EM | Admit: 2020-12-23 | Discharge: 2020-12-25 | DRG: 301 | Disposition: A | Discharge status: Home / Self Care | Payer: Medicare HMO | Attending: Internal Medicine | Admitting: Internal Medicine

## 2020-12-23 ENCOUNTER — Encounter (HOSPITAL_COMMUNITY): Payer: Self-pay | Admitting: Emergency Medicine

## 2020-12-23 ENCOUNTER — Emergency Department (HOSPITAL_COMMUNITY): Payer: Medicare HMO

## 2020-12-23 ENCOUNTER — Other Ambulatory Visit: Payer: Self-pay

## 2020-12-23 DIAGNOSIS — Z87891 Personal history of nicotine dependence: Secondary | ICD-10-CM

## 2020-12-23 DIAGNOSIS — I440 Atrioventricular block, first degree: Secondary | ICD-10-CM | POA: Diagnosis present

## 2020-12-23 DIAGNOSIS — I739 Peripheral vascular disease, unspecified: Secondary | ICD-10-CM | POA: Diagnosis present

## 2020-12-23 DIAGNOSIS — L0390 Cellulitis, unspecified: Secondary | ICD-10-CM

## 2020-12-23 DIAGNOSIS — Z955 Presence of coronary angioplasty implant and graft: Secondary | ICD-10-CM

## 2020-12-23 DIAGNOSIS — E785 Hyperlipidemia, unspecified: Secondary | ICD-10-CM

## 2020-12-23 DIAGNOSIS — R42 Dizziness and giddiness: Secondary | ICD-10-CM | POA: Diagnosis present

## 2020-12-23 DIAGNOSIS — J449 Chronic obstructive pulmonary disease, unspecified: Secondary | ICD-10-CM | POA: Diagnosis present

## 2020-12-23 DIAGNOSIS — I872 Venous insufficiency (chronic) (peripheral): Secondary | ICD-10-CM

## 2020-12-23 DIAGNOSIS — R0789 Other chest pain: Secondary | ICD-10-CM | POA: Diagnosis present

## 2020-12-23 DIAGNOSIS — Z89431 Acquired absence of right foot: Secondary | ICD-10-CM

## 2020-12-23 DIAGNOSIS — Z20822 Contact with and (suspected) exposure to covid-19: Secondary | ICD-10-CM | POA: Diagnosis present

## 2020-12-23 DIAGNOSIS — Z880 Allergy status to penicillin: Secondary | ICD-10-CM

## 2020-12-23 DIAGNOSIS — I2510 Atherosclerotic heart disease of native coronary artery without angina pectoris: Secondary | ICD-10-CM | POA: Diagnosis present

## 2020-12-23 DIAGNOSIS — R202 Paresthesia of skin: Secondary | ICD-10-CM | POA: Diagnosis present

## 2020-12-23 DIAGNOSIS — I252 Old myocardial infarction: Secondary | ICD-10-CM

## 2020-12-23 DIAGNOSIS — I714 Abdominal aortic aneurysm, without rupture: Secondary | ICD-10-CM | POA: Diagnosis present

## 2020-12-23 DIAGNOSIS — I10 Essential (primary) hypertension: Secondary | ICD-10-CM | POA: Diagnosis not present

## 2020-12-23 DIAGNOSIS — Z79899 Other long term (current) drug therapy: Secondary | ICD-10-CM

## 2020-12-23 DIAGNOSIS — Z881 Allergy status to other antibiotic agents status: Secondary | ICD-10-CM

## 2020-12-23 DIAGNOSIS — L97511 Non-pressure chronic ulcer of other part of right foot limited to breakdown of skin: Secondary | ICD-10-CM | POA: Diagnosis present

## 2020-12-23 DIAGNOSIS — Z7982 Long term (current) use of aspirin: Secondary | ICD-10-CM

## 2020-12-23 DIAGNOSIS — I70235 Atherosclerosis of native arteries of right leg with ulceration of other part of foot: Principal | ICD-10-CM | POA: Diagnosis present

## 2020-12-23 DIAGNOSIS — R079 Chest pain, unspecified: Secondary | ICD-10-CM | POA: Diagnosis present

## 2020-12-23 DIAGNOSIS — R7982 Elevated C-reactive protein (CRP): Secondary | ICD-10-CM | POA: Diagnosis present

## 2020-12-23 DIAGNOSIS — M79602 Pain in left arm: Secondary | ICD-10-CM | POA: Diagnosis present

## 2020-12-23 DIAGNOSIS — Z7902 Long term (current) use of antithrombotics/antiplatelets: Secondary | ICD-10-CM

## 2020-12-23 HISTORY — DX: Atherosclerotic heart disease of native coronary artery without angina pectoris: I25.10

## 2020-12-23 LAB — BASIC METABOLIC PANEL
Anion gap: 11 (ref 5–15)
BUN: 15 mg/dL (ref 8–23)
CO2: 24 mmol/L (ref 22–32)
Calcium: 9.6 mg/dL (ref 8.9–10.3)
Chloride: 101 mmol/L (ref 98–111)
Creatinine, Ser: 0.99 mg/dL (ref 0.61–1.24)
GFR, Estimated: >60 mL/min (ref >60)
Glucose, Bld: 125 mg/dL — ABNORMAL HIGH (ref 70–99)
Potassium: 3.8 mmol/L (ref 3.5–5.1)
Sodium: 136 mmol/L (ref 135–145)

## 2020-12-23 LAB — CBC
HCT: 42.5 % (ref 39.0–52.0)
Hemoglobin: 13.3 g/dL (ref 13.0–17.0)
MCH: 27.5 pg (ref 26.0–34.0)
MCHC: 31.3 g/dL (ref 30.0–36.0)
MCV: 88 fL (ref 80.0–100.0)
Platelets: 129 10*3/uL — ABNORMAL LOW (ref 150–400)
RBC: 4.83 MIL/uL (ref 4.22–5.81)
RDW: 15.5 % (ref 11.5–15.5)
WBC: 8.2 10*3/uL (ref 4.0–10.5)
nRBC: 0 % (ref 0.0–0.2)

## 2020-12-23 LAB — RESP PANEL BY RT-PCR (FLU A&B, COVID) ARPGX2
Influenza A by PCR: NEGATIVE
Influenza B by PCR: NEGATIVE
SARS Coronavirus 2 by RT PCR: NEGATIVE

## 2020-12-23 LAB — TROPONIN I (HIGH SENSITIVITY)
Troponin I (High Sensitivity): 6 ng/L (ref <18)
Troponin I (High Sensitivity): 6 ng/L (ref <18)

## 2020-12-23 MED ORDER — CLOPIDOGREL BISULFATE 75 MG PO TABS
75.0000 mg | ORAL_TABLET | Freq: Every day | ORAL | Status: DC
  Administered 2020-12-24 – 2020-12-25 (×2): 75 mg via ORAL
  Filled 2020-12-23 (×2): qty 1

## 2020-12-23 MED ORDER — ACETAMINOPHEN 325 MG PO TABS
650.0000 mg | ORAL_TABLET | ORAL | Status: DC | PRN
  Filled 2020-12-23: qty 2

## 2020-12-23 MED ORDER — FLUTICASONE FUROATE-VILANTEROL 200-25 MCG/INH IN AEPB
1.0000 | INHALATION_SPRAY | Freq: Every day | RESPIRATORY_TRACT | Status: DC
  Filled 2020-12-23: qty 28

## 2020-12-23 MED ORDER — UMECLIDINIUM BROMIDE 62.5 MCG/INH IN AEPB
1.0000 | INHALATION_SPRAY | Freq: Every day | RESPIRATORY_TRACT | Status: DC
  Filled 2020-12-23: qty 7

## 2020-12-23 MED ORDER — LOSARTAN POTASSIUM 50 MG PO TABS
50.0000 mg | ORAL_TABLET | Freq: Every day | ORAL | Status: DC
  Administered 2020-12-24 – 2020-12-25 (×2): 50 mg via ORAL
  Filled 2020-12-23 (×2): qty 1

## 2020-12-23 MED ORDER — ROSUVASTATIN CALCIUM 20 MG PO TABS
40.0000 mg | ORAL_TABLET | Freq: Every day | ORAL | Status: DC
  Administered 2020-12-24 – 2020-12-25 (×2): 40 mg via ORAL
  Filled 2020-12-23 (×2): qty 2

## 2020-12-23 MED ORDER — METOPROLOL TARTRATE 25 MG PO TABS
25.0000 mg | ORAL_TABLET | Freq: Two times a day (BID) | ORAL | Status: DC
  Administered 2020-12-24 – 2020-12-25 (×3): 25 mg via ORAL
  Filled 2020-12-23 (×3): qty 1

## 2020-12-23 MED ORDER — NITROGLYCERIN 0.4 MG SL SUBL: 0.4000 mg | SUBLINGUAL_TABLET | SUBLINGUAL | Status: DC | PRN

## 2020-12-23 MED ORDER — BUDESON-GLYCOPYRROL-FORMOTEROL 160-9-4.8 MCG/ACT IN AERO: 2.0000 | INHALATION_SPRAY | Freq: Two times a day (BID) | RESPIRATORY_TRACT | Status: DC

## 2020-12-23 MED ORDER — ASPIRIN 81 MG PO CHEW
81.0000 mg | CHEWABLE_TABLET | Freq: Every day | ORAL | Status: DC
  Administered 2020-12-24 – 2020-12-25 (×2): 81 mg via ORAL
  Filled 2020-12-23 (×2): qty 1

## 2020-12-23 MED ORDER — ONDANSETRON HCL 4 MG/2ML IJ SOLN: 4.0000 mg | Freq: Four times a day (QID) | INTRAMUSCULAR | Status: DC | PRN

## 2020-12-23 MED ORDER — ENOXAPARIN SODIUM 40 MG/0.4ML IJ SOSY
40.0000 mg | PREFILLED_SYRINGE | INTRAMUSCULAR | Status: DC
  Administered 2020-12-23 – 2020-12-24 (×2): 40 mg via SUBCUTANEOUS
  Filled 2020-12-23 (×2): qty 0.4

## 2020-12-23 NOTE — ED Triage Notes (Signed)
Patient complains of left hand numbness, generalized weakness, dizziness, and blurred vision since yesterday evening. Patient states he felt similar when he had an MI. Patient denies pain. Patient alert, oriented, and in no apparent distress at this time.

## 2020-12-23 NOTE — ED Provider Notes (Signed)
[facility] EMERGENCY DEPARTMENT Provider Note   CSN: [id number] Arrival date & time: 12/23/20  1443     History Chief Complaint  Patient presents with   Weakness    Jorge Clements is a 75 y.o. male.  75 yo M with a chief complaint of feeling lightheaded having chest pain and left arm tingling.  This been going on since last night.  Feels like when he had a heart attack about a year ago.  Denies significant discomfort to his chest.  States it was similar a year ago as well.  Denies cough or congestion or fever.  Tells me he did have a fever about 2 weeks ago.  Has chronic right lower extremity swelling that is unchanged.  Denies trauma to the chest.  Denies exertional symptoms.  Nothing seems to make it better or worse.  The history is provided by the patient.  Weakness Severity:  Moderate Onset quality:  Gradual Duration:  24 hours Timing:  Constant Progression:  Worsening Chronicity:  New Relieved by:  Nothing Worsened by:  Nothing Ineffective treatments:  None tried Associated symptoms: chest pain   Associated symptoms: no abdominal pain, no arthralgias, no diarrhea, no fever, no headaches, no myalgias, no shortness of breath and no vomiting       Past Medical History:  Diagnosis Date   Abdominal aortic aneurysm    no AAA on [place] in 2020   CAD (coronary artery disease)    Stent to RCA 2021   Chronic obstructive pulmonary disease    HLD (hyperlipidemia)    HTN (hypertension)    Peripheral artery disease    s/p R fem pop // s/p R transmet amp    Patient Active Problem List   Diagnosis Date Noted   Chest pain, rule out acute myocardial infarction 12/23/2020   PAD (peripheral artery disease) (HCC) 12/23/2020   HLD (hyperlipidemia) 12/23/2020   ACS (acute coronary syndrome) (HCC) 12/01/2019   Non-ST elevation (NSTEMI) myocardial infarction (HCC) 12/01/2019   NSTEMI (non-ST elevated myocardial infarction) (HCC) 12/01/2019   Status post  transmetatarsal amputation of right foot (HCC)    Subacute osteomyelitis, right ankle and foot (HCC)    Infected blister of foot 08/13/2017   Pressure injury of skin 08/12/2017   Weakness 08/11/2017   COPD (chronic obstructive pulmonary disease) (HCC) 08/11/2017   Cellulitis of right lower extremity 08/11/2017   Fall at home, initial encounter 08/11/2017   Thrombocytopenia (HCC) 08/11/2017   Weakness generalized 08/11/2017   Hypokalemia 08/11/2017   Essential hypertension 08/11/2017    Past Surgical History:  Procedure Laterality Date   ABDOMINAL AORTOGRAM N/A 08/17/2017   Procedure: ABDOMINAL AORTOGRAM;  Surgeon: Maeola Harman, MD;  Location: [facility] INVASIVE CV LAB;  Service: Cardiovascular;  Laterality: N/A;   AMPUTATION Right 08/21/2017   Procedure: TRANSMETATARSAL AMPUTATION RIGHT FOOT;  Surgeon: Nadara Mustard, MD;  Location: [facility] OR;  Service: Orthopedics;  Laterality: Right;   AMPUTATION TOE Right 08/18/2017   Procedure: AMPUTATION RIGHT SECOND AND THIRD TOES;  Surgeon: Maeola Harman, MD;  Location: [facility] OR;  Service: Vascular;  Laterality: Right;   CORONARY BALLOON ANGIOPLASTY N/A 12/03/2019   Procedure: CORONARY BALLOON ANGIOPLASTY;  Surgeon: Corky Crafts, MD;  Location: [facility] INVASIVE CV LAB;  Service: Cardiovascular;  Laterality: N/A;   CORONARY STENT INTERVENTION N/A 12/03/2019   Procedure: CORONARY STENT INTERVENTION;  Surgeon: Corky Crafts, MD;  Location: [facility] INVASIVE CV LAB;  Service: Cardiovascular;  Laterality: N/A;   ENDARTERECTOMY FEMORAL  Right 08/18/2017   Procedure: ENDARTERECTOMY RIGHT SUPERFICIAL Alfredia Ferguson;  Surgeon: Maeola Harman, MD;  Location: [facility] OR;  Service: Vascular;  Laterality: Right;   FEMORAL-POPLITEAL BYPASS GRAFT Right 08/18/2017   Procedure: BYPASS GRAFT RIGHT COMMON FEMORAL TO ABOVE KNEE POPLITEAL ARTERY USING RIGHT REVERSED GREAT SAPHENOUS VEIN;  Surgeon: Maeola Harman, MD;  Location: [facility] OR;  Service:  Vascular;  Laterality: Right;   LEFT HEART CATH AND CORONARY ANGIOGRAPHY N/A 12/03/2019   Procedure: LEFT HEART CATH AND CORONARY ANGIOGRAPHY;  Surgeon: Corky Crafts, MD;  Location: [facility] INVASIVE CV LAB;  Service: Cardiovascular;  Laterality: N/A;   LOWER EXTREMITY ANGIOGRAPHY Right 08/17/2017   Procedure: Lower Extremity Angiography;  Surgeon: Maeola Harman, MD;  Location: [facility] INVAS[facility] CV LAB;  Service: Cardiovascular;  Laterality: Right;   PERIPHERAL VASCULAR BALLOON ANGIOPLASTY Right 08/17/2017   Procedure: PERIPHERAL VASCULAR BALLOON ANGIOPLASTY;  Surgeon: Maeola Harman, MD;  Location: [facility] INVASIVE CV LAB;  Service: Cardiovascular;  Laterality: Right;  SFA UNABLE TO CROSS   VEIN HARVEST Right 08/18/2017   Procedure: VEIN HARVEST RIGHT GREAT SAPHENOUS;  Surgeon: Maeola Harman, MD;  Location: [facility] OR;  Service: Vascular;  Laterality: Right;   WOUND DEBRIDEMENT Right 08/18/2017   Procedure: DEBRIDEMENT WOUND RIGHT FOOT;  Surgeon: Maeola Harman, MD;  Location: [facility] OR;  Service: Vascular;  Laterality: Right;       Family History  Adopted: Yes    Social History   Tobacco Use   Smoking status: Former   Smokeless tobacco: Never  Vaping Use   Vaping Use: Former  Substance Use Topics   Alcohol use: Not Currently   Drug use: No    Home Medications Prior to Admission medications   Medication Sig Start Date End Date Taking? Authorizing Provider  Albuterol Sulfate 108 (90 Base) MCG/ACT AEPB Inhale 90 mcg into the lungs.   Yes [provider]  aspirin 81 MG chewable tablet Chew 1 tablet (81 mg total) by mouth daily. 12/04/19  Yes Georgie Chard D, NP  BREZTRI AEROSPHERE 160-9-4.8 MCG/ACT AERO Inhale 2 puffs into the lungs 2 (two) times daily. 07/09/19  Yes [provider]  clopidogrel (PLAVIX) 75 MG tablet Take 1 tablet (75 mg total) by mouth daily. 09/29/20  Yes Wendall Stade, MD  losartan (COZAAR) 50 MG tablet Take 1 tablet by  mouth once daily 12/08/20  Yes Netta Neat., NP  metoprolol tartrate (LOPRESSOR) 25 MG tablet Take 1 tablet by mouth twice daily 08/21/20  Yes Netta Neat., NP  Multiple Vitamins-Minerals (CENTRUM SILVER 50+MEN PO) Take by mouth.   Yes [provider]  nitroGLYCERIN (NITROSTAT) 0.4 MG SL tablet Place 1 tablet (0.4 mg total) under the tongue every 5 (five) minutes as needed for chest pain. 12/13/19 03/18/21 Yes Netta Neat., NP  rosuvastatin (CRESTOR) 40 MG tablet Take 1 tablet by mouth once daily 07/16/20  Yes Georgie Chard D, NP    Allergies    Penicillins and Clindamycin  Review of Systems   Review of Systems  Constitutional:  Negative for chills and fever.  HENT:  Negative for congestion and facial swelling.   Eyes:  Negative for discharge and visual disturbance.  Respiratory:  Negative for shortness of breath.   Cardiovascular:  Positive for chest pain. Negative for palpitations.  Gastrointestinal:  Negative for abdominal pain, diarrhea and vomiting.  Musculoskeletal:  Negative for arthralgias and myalgias.  Skin:  Negative for color change and rash.  Neurological:  Positive for weakness. Negative for tremors, syncope and headaches.  Psychiatric/Behavioral:  Negative for confusion and dysphoric mood.    Physical Exam Updated Vital Signs BP (!) 114/52   Pulse 64   Temp 97.8 F (36.6 C) (Oral)   Resp 20   SpO2 98%   Physical Exam Vitals and nursing note reviewed.  Constitutional:      Appearance: He is well-developed.  HENT:     Head: Normocephalic and atraumatic.  Eyes:     Pupils: Pupils are equal, round, and reactive to light.  Neck:     Vascular: No JVD.  Cardiovascular:     Rate and Rhythm: Normal rate and regular rhythm.     Heart sounds: No murmur heard.   No friction rub. No gallop.  Pulmonary:     Effort: No respiratory distress.     Breath sounds: No wheezing.  Abdominal:     General: There is no distension.     Tenderness:  There is no abdominal tenderness. There is no guarding or rebound.  Musculoskeletal:        General: Normal range of motion.     Cervical back: Normal range of motion and neck supple.     Right lower leg: Edema present.     Comments: Right lower extremity edema with chronic skin changes.  Skin:    Coloration: Skin is not pale.     Findings: No rash.  Neurological:     Mental Status: He is alert and oriented to person, place, and time.  Psychiatric:        Behavior: Behavior normal.    ED Results / Procedures / Treatments   Labs (all labs ordered are listed, but only abnormal results are displayed) Labs Reviewed  BASIC METABOLIC PANEL - Abnormal; Notable for the following components:      Result Value   Glucose, Bld 125 (*)    All other components within normal limits  CBC - Abnormal; Notable for the following components:   Platelets 129 (*)    All other components within normal limits  RESP PANEL BY RT-PCR (FLU A&B, COVID) [id number]  LIPID PANEL  TROPONIN I (HIGH SENSITIVITY)  TROPONIN I (HIGH SENSITIVITY)  TROPONIN I (HIGH SENSITIVITY)  TROPONIN I (HIGH SENSITIVITY)    EKG EKG Interpretation  Date/Time:  Tuesday December 23 2020 14:52:31 EDT Ventricular Rate:  78 PR Interval:  240 QRS Duration: 70 QT Interval:  390 QTC Calculation: 444 R Axis:   -41 Text Interpretation: Sinus rhythm with 1st degree A-V block Left axis deviation Low voltage QRS Cannot rule out Anteroseptal infarct , age undetermined Abnormal ECG No significant change since last tracing Confirmed by Melene PlanFloyd, Marcas Bowsher [phone]([id number]) on 12/23/2020 6:48:30 PM  Radiology DG Chest 2 View  Result Date: 12/23/2020 CLINICAL DATA:  Chest pain. EXAM: CHEST - 2 VIEW COMPARISON:  Chest x-ray 12/06/2019. FINDINGS: There is central peribronchial wall thickening, unchanged. There is some minimal strandy lingular opacities. The lungs are otherwise clear. There is no pleural effusion or pneumothorax identified. The cardiomediastinal  silhouette is within normal limits. Degenerative changes affect the spine. No acute fractures are identified. IMPRESSION: 1. Strandy opacities in the lingula may represent atelectasis or infection. 2. Mild bronchitic changes. Electronically Signed   By: Darliss CheneyAmy  Guttmann M.D.   On: 12/23/2020 15:58    Procedures Procedures   Medications Ordered in ED Medications  nitroGLYCERIN (NITROSTAT) SL tablet 0.4 mg (has no administration in time range)  acetaminophen (TYLENOL) tablet 650 mg (has no  administration in time range)  ondansetron (ZOFRAN) injection 4 mg (has no administration in time range)  enoxaparin (LOVENOX) injection 40 mg (40 mg Subcutaneous Given 12/23/20 2217)  metoprolol tartrate (LOPRESSOR) tablet 25 mg (0 mg Oral Hold 12/23/20 2209)  losartan (COZAAR) tablet 50 mg (has no administration in time range)  clopidogrel (PLAVIX) tablet 75 mg (has no administration in time range)  aspirin chewable tablet 81 mg (has no administration in time range)  rosuvastatin (CRESTOR) tablet 40 mg (has no administration in time range)  umeclidinium bromide (INCRUSE ELLIPTA) 62.5 MCG/INH 1 puff (has no administration in time range)  fluticasone furoate-vilanterol (BREO ELLIPTA) 200-25 MCG/INH 1 puff (has no administration in time range)    ED Course  I have reviewed the triage vital signs and the nursing notes.  Pertinent labs & imaging results that were available during my care of the patient were reviewed by me and considered in my medical decision making (see chart for details).    MDM Rules/Calculators/A&P                           75 yo M with a chief complaint of symptoms that are consistent when he had a heart attack last year.  Symptoms are a bit atypical patient has a feeling of lightheadedness feeling of being flushed and a feeling of left arm numbness.  He says he has a little bit of chest pain but nothing terrible.  Feels just like when he had a heart attack a year ago.  Not exertional.   Chest x-ray with possible lingular pneumonia though denies any cough or fever.  Troponins negative.  EKG without concerning changes.  Will discuss with cardiology.  I discussed case with Dr. Mayford Knife, cardiology.  With the patient having atypical symptoms recommended medical admission.  Will discuss with hospitalist.    The patients results and plan were reviewed and discussed.   Any x-rays performed were independently reviewed by myself.   Differential diagnosis were considered with the presenting HPI.  Medications  nitroGLYCERIN (NITROSTAT) SL tablet 0.4 mg (has no administration in time range)  acetaminophen (TYLENOL) tablet 650 mg (has no administration in time range)  ondansetron (ZOFRAN) injection 4 mg (has no administration in time range)  enoxaparin (LOVENOX) injection 40 mg (40 mg Subcutaneous Given 12/23/20 2217)  metoprolol tartrate (LOPRESSOR) tablet 25 mg (0 mg Oral Hold 12/23/20 2209)  losartan (COZAAR) tablet 50 mg (has no administration in time range)  clopidogrel (PLAVIX) tablet 75 mg (has no administration in time range)  aspirin chewable tablet 81 mg (has no administration in time range)  rosuvastatin (CRESTOR) tablet 40 mg (has no administration in time range)  umeclidinium bromide (INCRUSE ELLIPTA) 62.5 MCG/INH 1 puff (has no administration in time range)  fluticasone furoate-vilanterol (BREO ELLIPTA) 200-25 MCG/INH 1 puff (has no administration in time range)    Vitals:   12/23/20 2000 12/23/20 2045 12/23/20 2115 12/23/20 2145  BP: 129/60 137/61 125/66 (!) 114/52  Pulse: 61 66 64   Resp: 16 18 16 20   Temp:      TempSrc:      SpO2: 99% 98% 98%     Final diagnoses:  Chest pain with high risk for cardiac etiology    Admission/ observation were discussed with the admitting physician, patient and/or family and they are comfortable with the plan.    Final Clinical Impression(s) / ED Diagnoses Final diagnoses:  Chest pain with high risk for cardiac etiology  Rx  / DC Orders ED Discharge Orders     None        Melene Plan, DO 12/23/20 2230

## 2020-12-23 NOTE — H&P (Addendum)
History and Physical    Jorge Clements YOV:[id number] DOB: [birth year] DOA: 12/23/2020  PCP: Benita Stabile, MD  Patient coming from: Home  I have personally briefly reviewed patient's old medical records in [facility]  Chief Complaint: Weakness  HPI: Jorge Clements is a 75 y.o. male with medical history significant of PAD, HTN, HLD, CAD s/p stent to RCA for NSTEMI 1 year ago.  Pt presents to ED with c/o lightheadedness, CP, L arm tingling / numbness.  Symptoms onset last evening, persistent.  Feels like symptoms he had when he had NSTEMI last year.  CP mild.  Symptoms constant.  Nothing makes better or worse.  No abd pain, no diarrhea, no fevers, no vomiting, no SOB.   ED Course: Trop neg x2.  COVID neg.  EDP spoke with cards who recd medical admit for obs.   Review of Systems: As per HPI, otherwise all review of systems negative.  Past Medical History:  Diagnosis Date   Abdominal aortic aneurysm    no AAA on [place] in 2020   CAD (coronary artery disease)    Stent to RCA 2021   Chronic obstructive pulmonary disease    HLD (hyperlipidemia)    HTN (hypertension)    Peripheral artery disease    s/p R fem pop // s/p R transmet amp    Past Surgical History:  Procedure Laterality Date   ABDOMINAL AORTOGRAM N/A 08/17/2017   Procedure: ABDOMINAL AORTOGRAM;  Surgeon: Maeola Harman, MD;  [facility];  Service: Cardiovascular;  Laterality: N/A;   AMPUTATION Right 08/21/2017   Procedure: TRANSMETATARSAL AMPUTATION RIGHT FOOT;  Surgeon: Nadara Mustard, MD;  Location: [facility] OR;  Service: Orthopedics;  Laterality: Right;   AMPUTATION TOE Right 08/18/2017   Procedure: AMPUTATION RIGHT SECOND AND THIRD TOES;  Surgeon: Maeola Harman, MD;  Location[facility];  Service: Vascular;  Laterality: Right;   CORONARY BALLOON ANGIOPLASTY N/A 12/03/2019   Procedure: CORONARY BALLOON ANGIOPLASTY;  Surgeon: Corky Crafts, MD;  Location: [facility] INVASIVE CV LAB;  Service:  Cardiovascular;  Laterality: N/A;   CORONARY STENT INTERVENTION N/A 12/03/2019   Procedure: CORONARY STENT INTERVENTION;  Surgeon: Corky Crafts, MD;  Location: [facility] INVASIVE CV LAB;  Service: Cardiovascular;  Laterality: N/A;   ENDARTERECTOMY FEMORAL Right 08/18/2017   Procedure: ENDARTERECTOMY RIGHT SUPERFICIAL Alfredia Ferguson;  Surgeon: Maeola Harman, MD;  Location: [facility] OR;  Service: Vascular;  Laterality: Right;   FEMORAL-POPLITEAL BYPASS GRAFT Right 08/18/2017   Procedure: BYPASS GRAFT RIGHT COMMON FEMORAL TO ABOVE KNEE POPLITEAL ARTERY USING RIGHT REVERSED GREAT SAPHENOUS VEIN;  Surgeon: Maeola Harman, MD;  Location: [facility] OR;  Service: Vascular;  Laterality: Right;   LEFT HEART CATH AND CORONARY ANGIOGRAPHY N/A 12/03/2019   Procedure: LEFT HEART CATH AND CORONARY ANGIOGRAPHY;  Surgeon: Corky Crafts, MD;  Location: [facility] INVASIVE CV LAB;  Service: Cardiovascular;  Laterality: N/A;   LOWER EXTREMITY ANGIOGRAPHY Right 08/17/2017   Procedure: Lower Extremity Angiography;  Surgeon: Maeola Harman, MD;  Location: [facility] INVASIVE CV LAB;  Service: Cardiovascular;  Laterality: Right;   PERIPHERAL VASCULAR BALLOON ANGIOPLASTY Right 08/17/2017   Procedure: PERIPHERAL VASCULAR BALLOON ANGIOPLASTY;  Surgeon: Maeola Harman, MD;  Location: [facility] INVASIVE CV LAB;  Service: Cardiovascular;  Laterality: Right;  SFA UNABLE TO CROSS   VEIN HARVEST Right 08/18/2017   Procedure: VEIN HARVEST RIGHT GREAT SAPHENOUS;  Surgeon: Maeola Harman, MD;  Location: [facility] OR;  Service: Vascular;  Laterality: Right;   WOUND DEBRIDEMENT  Right 08/18/2017   Procedure: DEBRIDEMENT WOUND RIGHT FOOT;  Surgeon: Maeola Harmanain, Brandon Christopher, MD;  [facility];  Service: Vascular;  Laterality: Right;     reports that he has quit smoking. He has never used smokeless tobacco. He reports that he does not currently use alcohol. He reports that he does not use drugs.  Allergies  Allergen  Reactions   Penicillins Hives and Rash    Childhood reaction Has patient had a PCN reaction causing immediate rash, facial/tongue/throat swelling, SOB or lightheadedness with hypotension: No PT DEVELOPED ## SEVERE ## RASH INVOLVING MUCUS MEMBRANES or SKIN NECROSIS: #  #  YES  #  # Has patient had a PCN reaction that required hospitalization: Unknown Has patient had a PCN reaction occurring within the last 10 years: No   Clindamycin Rash    Diffuse rash across body    Family History  Adopted: Yes     Prior to Admission medications   Medication Sig Start Date End Date Taking? Authorizing Provider  Albuterol Sulfate 108 (90 Base) MCG/ACT AEPB Inhale 90 mcg into the lungs.   Yes [provider]  aspirin 81 MG chewable tablet Chew 1 tablet (81 mg total) by mouth daily. 12/04/19  Yes Georgie ChardMcDaniel, Jill D, NP  BREZTRI AEROSPHERE 160-9-4.8 MCG/ACT AERO Inhale 2 puffs into the lungs 2 (two) times daily. 07/09/19  Yes [provider]  clopidogrel (PLAVIX) 75 MG tablet Take 1 tablet (75 mg total) by mouth daily. 09/29/20  Yes Wendall StadeNishan, Peter C, MD  losartan (COZAAR) 50 MG tablet Take 1 tablet by mouth once daily 12/08/20  Yes Netta NeatQuinn, Andrew L Jr., NP  metoprolol tartrate (LOPRESSOR) 25 MG tablet Take 1 tablet by mouth twice daily 08/21/20  Yes Netta NeatQuinn, Andrew L Jr., NP  Multiple Vitamins-Minerals (CENTRUM SILVER 50+MEN PO) Take by mouth.   Yes [provider]  nitroGLYCERIN (NITROSTAT) 0.4 MG SL tablet Place 1 tablet (0.4 mg total) under the tongue every 5 (five) minutes as needed for chest pain. 12/13/19 03/18/21 Yes Netta NeatQuinn, Andrew L Jr., NP  rosuvastatin (CRESTOR) 40 MG tablet Take 1 tablet by mouth once daily 07/16/20  Yes Georgie ChardMcDaniel, Jill D, NP    Physical Exam: Vitals:   12/23/20 1610 12/23/20 1845 12/23/20 1900 12/23/20 1930  BP: (!) 132/59 (!) 124/57 (!) 160/68 (!) 137/42  Pulse: 74 62 63 61  Resp: 18 13 16 15   Temp:      TempSrc:      SpO2: 98% 99% 99% 99%     Constitutional: NAD, calm, comfortable Eyes: PERRL, lids and conjunctivae normal ENMT: Mucous membranes are moist. Posterior pharynx clear of any exudate or lesions.Normal dentition.  Neck: normal, supple, no masses, no thyromegaly Respiratory: clear to auscultation bilaterally, no wheezing, no crackles. Normal respiratory effort. No accessory muscle use.  Cardiovascular: Regular rate and rhythm, no murmurs / rubs / gallops. No extremity edema. 2+ pedal pulses. No carotid bruits.  Abdomen: no tenderness, no masses palpated. No hepatosplenomegaly. Bowel sounds positive.  Musculoskeletal: no clubbing / cyanosis. No joint deformity upper and lower extremities. Good ROM, no contractures. Normal muscle tone.  Skin: no rashes, lesions, ulcers. No induration Neurologic: CN 2-12 grossly intact. Sensation intact, DTR normal. Strength 5/5 in all 4.  Psychiatric: Normal judgment and insight. Alert and oriented x 3. Normal mood.    Labs on Admission: I have personally reviewed following labs and imaging studies  CBC: Recent Labs  Lab 12/23/20 1520  WBC 8.2  HGB 13.3  HCT 42.5  MCV 88.0  PLT 129*   Basic Metabolic Panel: Recent Labs  Lab 12/23/20 1520  NA 136  K 3.8  CL 101  CO2 24  GLUCOSE 125*  BUN 15  CREATININE 0.99  CALCIUM 9.6   GFR: CrCl cannot be calculated (Unknown ideal weight.). Liver Function Tests: No results for input(s): AST, ALT, ALKPHOS, BILITOT, PROT, ALBUMIN in the last 168 hours. No results for input(s): LIPASE, AMYLASE in the last 168 hours. No results for input(s): AMMONIA in the last 168 hours. Coagulation Profile: No results for input(s): INR, PROTIME in the last 168 hours. Cardiac Enzymes: No results for input(s): CKTOTAL, CKMB, CKMBINDEX, TROPONINI in the last 168 hours. BNP (last 3 results) No results for input(s): PROBNP in the last 8760 hours. HbA1C: No results for input(s): HGBA1C in the last 72 hours. CBG: No results for input(s): GLUCAP in  the last 168 hours. Lipid Profile: No results for input(s): CHOL, HDL, LDLCALC, TRIG, CHOLHDL, LDLDIRECT in the last 72 hours. Thyroid Function Tests: No results for input(s): TSH, T4TOTAL, FREET4, T3FREE, THYROIDAB in the last 72 hours. Anemia Panel: No results for input(s): VITAMINB12, FOLATE, FERRITIN, TIBC, IRON, RETICCTPCT in the last 72 hours. Urine analysis:    Component Value Date/Time   COLORURINE YELLOW 08/11/2017 0642   APPEARANCEUR CLEAR 08/11/2017 0642   LABSPEC 1.017 08/11/2017 0642   PHURINE 6.0 08/11/2017 0642   GLUCOSEU NEGATIVE 08/11/2017 0642   HGBUR MODERATE (A) 08/11/2017 0642   BILIRUBINUR NEGATIVE 08/11/2017 0642   KETONESUR 5 (A) 08/11/2017 0642   PROTEINUR 30 (A) 08/11/2017 0642   UROBILINOGEN 0.2 11/17/2013 1301   NITRITE NEGATIVE 08/11/2017 0642   LEUKOCYTESUR NEGATIVE 08/11/2017 0642    Radiological Exams on Admission: DG Chest 2 View  Result Date: 12/23/2020 CLINICAL DATA:  Chest pain. EXAM: CHEST - 2 VIEW COMPARISON:  Chest x-ray 12/06/2019. FINDINGS: There is central peribronchial wall thickening, unchanged. There is some minimal strandy lingular opacities. The lungs are otherwise clear. There is no pleural effusion or pneumothorax identified. The cardiomediastinal silhouette is within normal limits. Degenerative changes affect the spine. No acute fractures are identified. IMPRESSION: 1. Strandy opacities in the lingula may represent atelectasis or infection. 2. Mild bronchitic changes. Electronically Signed   By: Darliss Cheney M.D.   On: 12/23/2020 15:58    EKG: Independently reviewed.  Assessment/Plan Principal Problem:   Chest pain, rule out acute myocardial infarction Active Problems:   Essential hypertension   PAD (peripheral artery disease) (HCC)   HLD (hyperlipidemia)    CP R/O - Atypical symptoms, neg trops and EKG thus far, pt high risk though as symptoms similar to NSTEMI last year, and pt with significant vascular disease history. CP  obs pathway Serial trops Tele monitor NPO after MN Cont ASA+Plavix EDP spoke with cards who recd medical admission for obs. HTN - Cont home BP meds (BB and ACEi) HLD - Check FLP in AM Cont crestor Though cholesterol was very well controlled on crestor last year with LDL of just 39 (HLD 38).  DVT prophylaxis: Lovenox (DVT ppx dose) Code Status: Full Family Communication: No family in room Disposition Plan: Home after cleared by cards Consults called: EDP spoke with Dr. Mayford Knife Admission status: Place in obs   Annjanette Wertenberger M. DO [facility]  How to contact the [facility] Attending or Consulting provider 7A - 7P or covering provider during after hours 7P -7A, for this patient?  Check the care team in [facility] and look for a) attending/consulting [facility] provider listed  and b) the [facility] team listed Log into [email or web address]  Amion Physician Scheduling and messaging for groups and whole hospitals  On call and physician scheduling software for group practices, residents, hospitalists and other medical providers for call, clinic, rotation and shift schedules. On[employer] is a hospital-wide system for scheduling doctors and paging doctors on call. EasyPlot is for scientific plotting and data analysis.  [email or web address]  and use [facility] universal password to access. If you do not have the password, please contact the hospital operator.  Locate the [facility] provider you are looking for under [facility] and page to a number that you can be directly reached. If you still have difficulty reaching the provider, please page the [facility] (Director on Call) for the Hospitalists listed on amion for assistance.  12/23/2020, 9:29 PM

## 2020-12-24 ENCOUNTER — Observation Stay (HOSPITAL_COMMUNITY): Payer: Medicare HMO

## 2020-12-24 DIAGNOSIS — I714 Abdominal aortic aneurysm, without rupture: Secondary | ICD-10-CM | POA: Diagnosis present

## 2020-12-24 DIAGNOSIS — I872 Venous insufficiency (chronic) (peripheral): Secondary | ICD-10-CM

## 2020-12-24 DIAGNOSIS — Z95828 Presence of other vascular implants and grafts: Secondary | ICD-10-CM | POA: Diagnosis not present

## 2020-12-24 DIAGNOSIS — I2510 Atherosclerotic heart disease of native coronary artery without angina pectoris: Secondary | ICD-10-CM | POA: Diagnosis present

## 2020-12-24 DIAGNOSIS — I739 Peripheral vascular disease, unspecified: Secondary | ICD-10-CM

## 2020-12-24 DIAGNOSIS — L97511 Non-pressure chronic ulcer of other part of right foot limited to breakdown of skin: Secondary | ICD-10-CM | POA: Diagnosis not present

## 2020-12-24 DIAGNOSIS — R079 Chest pain, unspecified: Secondary | ICD-10-CM | POA: Diagnosis not present

## 2020-12-24 DIAGNOSIS — I70233 Atherosclerosis of native arteries of right leg with ulceration of ankle: Secondary | ICD-10-CM | POA: Diagnosis not present

## 2020-12-24 DIAGNOSIS — J449 Chronic obstructive pulmonary disease, unspecified: Secondary | ICD-10-CM | POA: Diagnosis present

## 2020-12-24 DIAGNOSIS — R7982 Elevated C-reactive protein (CRP): Secondary | ICD-10-CM | POA: Diagnosis present

## 2020-12-24 DIAGNOSIS — I70235 Atherosclerosis of native arteries of right leg with ulceration of other part of foot: Secondary | ICD-10-CM | POA: Diagnosis present

## 2020-12-24 DIAGNOSIS — R0789 Other chest pain: Secondary | ICD-10-CM | POA: Diagnosis present

## 2020-12-24 DIAGNOSIS — L03115 Cellulitis of right lower limb: Secondary | ICD-10-CM | POA: Diagnosis not present

## 2020-12-24 DIAGNOSIS — I70232 Atherosclerosis of native arteries of right leg with ulceration of calf: Secondary | ICD-10-CM | POA: Diagnosis not present

## 2020-12-24 DIAGNOSIS — Z880 Allergy status to penicillin: Secondary | ICD-10-CM | POA: Diagnosis not present

## 2020-12-24 DIAGNOSIS — I70244 Atherosclerosis of native arteries of left leg with ulceration of heel and midfoot: Secondary | ICD-10-CM | POA: Diagnosis not present

## 2020-12-24 DIAGNOSIS — R202 Paresthesia of skin: Secondary | ICD-10-CM | POA: Diagnosis present

## 2020-12-24 DIAGNOSIS — Z87891 Personal history of nicotine dependence: Secondary | ICD-10-CM | POA: Diagnosis not present

## 2020-12-24 DIAGNOSIS — Z89431 Acquired absence of right foot: Secondary | ICD-10-CM

## 2020-12-24 DIAGNOSIS — I10 Essential (primary) hypertension: Secondary | ICD-10-CM | POA: Diagnosis present

## 2020-12-24 DIAGNOSIS — I252 Old myocardial infarction: Secondary | ICD-10-CM | POA: Diagnosis not present

## 2020-12-24 DIAGNOSIS — S91301A Unspecified open wound, right foot, initial encounter: Secondary | ICD-10-CM | POA: Diagnosis not present

## 2020-12-24 DIAGNOSIS — Z20822 Contact with and (suspected) exposure to covid-19: Secondary | ICD-10-CM | POA: Diagnosis present

## 2020-12-24 DIAGNOSIS — Z7902 Long term (current) use of antithrombotics/antiplatelets: Secondary | ICD-10-CM | POA: Diagnosis not present

## 2020-12-24 DIAGNOSIS — R42 Dizziness and giddiness: Secondary | ICD-10-CM | POA: Diagnosis present

## 2020-12-24 DIAGNOSIS — Z7982 Long term (current) use of aspirin: Secondary | ICD-10-CM | POA: Diagnosis not present

## 2020-12-24 DIAGNOSIS — I70234 Atherosclerosis of native arteries of right leg with ulceration of heel and midfoot: Secondary | ICD-10-CM | POA: Diagnosis not present

## 2020-12-24 DIAGNOSIS — Z9889 Other specified postprocedural states: Secondary | ICD-10-CM | POA: Diagnosis not present

## 2020-12-24 DIAGNOSIS — I440 Atrioventricular block, first degree: Secondary | ICD-10-CM | POA: Diagnosis present

## 2020-12-24 DIAGNOSIS — Z79899 Other long term (current) drug therapy: Secondary | ICD-10-CM | POA: Diagnosis not present

## 2020-12-24 DIAGNOSIS — Z955 Presence of coronary angioplasty implant and graft: Secondary | ICD-10-CM | POA: Diagnosis not present

## 2020-12-24 DIAGNOSIS — E785 Hyperlipidemia, unspecified: Secondary | ICD-10-CM | POA: Diagnosis present

## 2020-12-24 DIAGNOSIS — Z881 Allergy status to other antibiotic agents status: Secondary | ICD-10-CM | POA: Diagnosis not present

## 2020-12-24 DIAGNOSIS — M79602 Pain in left arm: Secondary | ICD-10-CM | POA: Diagnosis present

## 2020-12-24 LAB — LIPID PANEL
Cholesterol: 90 mg/dL (ref 0–200)
HDL: 29 mg/dL — ABNORMAL LOW (ref >40)
LDL Cholesterol: 34 mg/dL (ref 0–99)
Total CHOL/HDL Ratio: 3.1 RATIO
Triglycerides: 135 mg/dL (ref <150)
VLDL: 27 mg/dL (ref 0–40)

## 2020-12-24 LAB — TROPONIN I (HIGH SENSITIVITY)
Troponin I (High Sensitivity): 6 ng/L (ref <18)
Troponin I (High Sensitivity): 7 ng/L (ref <18)

## 2020-12-24 LAB — MRSA NEXT GEN BY PCR, NASAL: MRSA by PCR Next Gen: NOT DETECTED

## 2020-12-24 LAB — SEDIMENTATION RATE: Sed Rate: 40 mm/hr — ABNORMAL HIGH (ref 0–16)

## 2020-12-24 LAB — C-REACTIVE PROTEIN: CRP: 1.9 mg/dL — ABNORMAL HIGH (ref <1.0)

## 2020-12-24 MED ORDER — VANCOMYCIN HCL 2000 MG/400ML IV SOLN
2000.0000 mg | Freq: Once | INTRAVENOUS | Status: AC
  Administered 2020-12-24: 2000 mg via INTRAVENOUS
  Filled 2020-12-24 (×2): qty 400

## 2020-12-24 MED ORDER — VANCOMYCIN HCL IN DEXTROSE 1-5 GM/200ML-% IV SOLN
1000.0000 mg | Freq: Two times a day (BID) | INTRAVENOUS | Status: DC
  Administered 2020-12-24 – 2020-12-25 (×3): 1000 mg via INTRAVENOUS
  Filled 2020-12-24 (×3): qty 200

## 2020-12-24 NOTE — Progress Notes (Signed)
[facility] notified via chat the right foot has cracking some drainage and smell. May need wound consult

## 2020-12-24 NOTE — Consult Note (Signed)
Reason for Consult:Foot ulceration Referring Physician: Dow Adolph Time called: 1148 Time at bedside: 1234   Jorge Clements is an 75 y.o. male.  HPI: Jorge Clements was admitted yesterday for workup of weakness possibly related to his heart. Incidentally he was noted to have right foot ulcer and orthopedic surgery was consulted. He does not know how long it's been there. It has not been symptomatic.  Past Medical History:  Diagnosis Date   Abdominal aortic aneurysm    no AAA on [place] in 2020   CAD (coronary artery disease)    Stent to RCA 2021   Chronic obstructive pulmonary disease    HLD (hyperlipidemia)    HTN (hypertension)    Peripheral artery disease    s/p R fem pop // s/p R transmet amp    Past Surgical History:  Procedure Laterality Date   ABDOMINAL AORTOGRAM N/A 08/17/2017   Procedure: ABDOMINAL AORTOGRAM;  Surgeon: Maeola Harman, MD;  Location: [facility] INVASIVE CV LAB;  Service: Cardiovascular;  Laterality: N/A;   AMPUTATION Right 08/21/2017   Procedure: TRANSMETATARSAL AMPUTATION RIGHT FOOT;  Surgeon: Nadara Mustard, MD;  Location: [facility] OR;  Service: Orthopedics;  Laterality: Right;   AMPUTATION TOE Right 08/18/2017   Procedure: AMPUTATION RIGHT SECOND AND THIRD TOES;  Surgeon: Maeola Harman, MD;  Location: [facility] OR;  Service: Vascular;  Laterality: Right;   CORONARY BALLOON ANGIOPLASTY N/A 12/03/2019   Procedure: CORONARY BALLOON ANGIOPLASTY;  Surgeon: Corky Crafts, MD;  Location: [facility] INVASIVE CV LAB;  Service: Cardiovascular;  Laterality: N/A;   CORONARY STENT INTERVENTION N/A 12/03/2019   Procedure: CORONARY STENT INTERVENTION;  Surgeon: Corky Crafts, MD;  Location: [facility] INVASIVE CV LAB;  Service: Cardiovascular;  Laterality: N/A;   ENDARTERECTOMY FEMORAL Right 08/18/2017   Procedure: ENDARTERECTOMY RIGHT SUPERFICIAL Jorge Clements;  Surgeon: Maeola Harman, MD;  Location: [facility] OR;  Service: Vascular;  Laterality: Right;   FEMORAL-POPLITEAL BYPASS  GRAFT Right 08/18/2017   Procedure: BYPASS GRAFT RIGHT COMMON FEMORAL TO ABOVE KNEE POPLITEAL ARTERY USING RIGHT REVERSED GREAT SAPHENOUS VEIN;  Surgeon: Maeola Harman, MD;  Location: [facility] OR;  Service: Vascular;  Laterality: Right;   LEFT HEART CATH AND CORONARY ANGIOGRAPHY N/A 12/03/2019   Procedure: LEFT HEART CATH AND CORONARY ANGIOGRAPHY;  Surgeon: Corky Crafts, MD;  Location: [facility] INVASIVE CV LAB;  Service: Cardiovascular;  Laterality: N/A;   LOWER EXTREMITY ANGIOGRAPHY Right 08/17/2017   Procedure: Lower Extremity Angiography;  Surgeon: Maeola Harman, MD;  Location: [facility] INVASIVE CV LAB;  Service: Cardiovascular;  Laterality: Right;   PERIPHERAL VASCULAR BALLOON ANGIOPLASTY Right 08/17/2017   Procedure: PERIPHERAL VASCULAR BALLOON ANGIOPLASTY;  Surgeon: Maeola Harman, MD;  Location: [facility] INVASIVE CV LAB;  Service: Cardiovascular;  Laterality: Right;  SFA UNABLE TO CROSS   VEIN HARVEST Right 08/18/2017   Procedure: VEIN HARVEST RIGHT GREAT SAPHENOUS;  Surgeon: Maeola Harman, MD;  Location: [facility] OR;  Service: Vascular;  Laterality: Right;   WOUND DEBRIDEMENT Right 08/18/2017   Procedure: DEBRIDEMENT WOUND RIGHT FOOT;  Surgeon: Maeola Harman, MD;  Location: [facility] OR;  Service: Vascular;  Laterality: Right;    Family History  Adopted: Yes    Social History:  reports that he has quit smoking. He has never used smokeless tobacco. He reports that he does not currently use alcohol. He reports that he does not use drugs.  Allergies:  Allergies  Allergen Reactions   Penicillins Hives and Rash    Childhood reaction Has patient had a PCN reaction causing  immediate rash, facial/tongue/throat swelling, SOB or lightheadedness with hypotension: No PT DEVELOPED ## SEVERE ## RASH INVOLVING MUCUS MEMBRANES or SKIN NECROSIS: #  #  YES  #  # Has patient had a PCN reaction that required hospitalization: Unknown Has patient had a PCN reaction occurring within  the last 10 years: No   Clindamycin Rash    Diffuse rash across body    Medications: I have reviewed the patient's current medications.  Results for orders placed or performed during the hospital encounter of 12/23/20 (from the past 48 hour(s))  Basic metabolic panel     Status: Abnormal   Collection Time: 12/23/20  3:20 PM  Result Value Ref Range   Sodium 136 135 - 145 mmol/L   Potassium 3.8 3.5 - 5.1 mmol/L   Chloride 101 98 - 111 mmol/L   CO2 24 22 - 32 mmol/L   Glucose, Bld 125 (H) 70 - 99 mg/dL    Comment: Glucose reference range applies only to samples taken after fasting for at least 8 hours.   BUN 15 8 - 23 mg/dL   Creatinine, Ser 1.610.99 0.61 - 1.24 mg/dL   Calcium 9.6 8.9 - 09.610.3 mg/dL   GFR, Estimated >04>60 >54>60 mL/min    Comment: (NOTE) Calculated using the CKD-EPI Creatinine Equation (2021)    Anion gap 11 5 - 15    Comment: Performed at [facility], [address] [place], [place] [address]  CBC     Status: Abnormal   Collection Time: 12/23/20  3:20 PM  Result Value Ref Range   WBC 8.2 4.0 - 10.5 K/uL   RBC 4.83 4.22 - 5.81 MIL/uL   Hemoglobin 13.3 13.0 - 17.0 g/dL   HCT 91.442.5 78.239.0 - 95.652.0 %   MCV 88.0 80.0 - 100.0 fL   MCH 27.5 26.0 - 34.0 pg   MCHC 31.3 30.0 - 36.0 g/dL   RDW 21.315.5 08.611.5 - 57.815.5 %   Platelets 129 (L) 150 - 400 K/uL    Comment: SPECIMEN CHECKED FOR CLOTS REPEATED TO VERIFY    nRBC 0.0 0.0 - 0.2 %    Comment: Performed at [facility], [address] [place], [place] [address]  Troponin I (High Sensitivity)     Status: None   Collection Time: 12/23/20  3:20 PM  Result Value Ref Range   Troponin I (High Sensitivity) 6 <18 ng/L    Comment: (NOTE) Elevated high sensitivity troponin I (hsTnI) values and significant  changes across serial measurements may suggest ACS but many other  chronic and acute conditions are known to elevate hsTnI results.  Refer to the "Links" section for chest pain algorithms and additional   guidance. Performed at [facility], [address] [place], [place] [address]   Troponin I (High Sensitivity)     Status: None   Collection Time: 12/23/20  6:42 PM  Result Value Ref Range   Troponin I (High Sensitivity) 6 <18 ng/L    Comment: (NOTE) Elevated high sensitivity troponin I (hsTnI) values and significant  changes across serial measurements may suggest ACS but many other  chronic and acute conditions are known to elevate hsTnI results.  Refer to the "Links" section for chest pain algorithms and additional  guidance. Performed at [facility], [address] [place], [place] [address]   Resp Panel by RT-PCR (Flu A&B, Covid) Nasopharyngeal Swab     Status: None   Collection Time: 12/23/20  7:15 PM  Specimen: Nasopharyngeal Swab; Nasopharyngeal(NP) swabs in vial transport medium  Result Value Ref Range   SARS Coronavirus 2 by RT PCR NEGATIVE NEGATIVE    Comment: (NOTE) SARS-CoV-2 target nucleic acids are NOT DETECTED.  The SARS-CoV-2 RNA is generally detectable in upper respiratory specimens during the acute phase of infection. The lowest concentration of SARS-CoV-2 viral copies this assay can detect is 138 copies/mL. A negative result does not preclude SARS-Cov-2 infection and should not be used as the sole basis for treatment or other patient management decisions. A negative result may occur with  improper specimen collection/handling, submission of specimen other than nasopharyngeal swab, presence of viral mutation(s) within the areas targeted by this assay, and inadequate number of viral copies(<138 copies/mL). A negative result must be combined with clinical observations, patient history, and epidemiological information. The expected result is Negative.  Fact Sheet for Patients:  BloggerCourse.com  Fact Sheet for Healthcare Providers:  SeriousBroker.it  This test is no t yet approved or cleared  by the [place] FDA and  has been authorized for detection and/or diagnosis of SARS-CoV-2 by FDA under an Emergency Use Authorization (EUA). This EUA will remain  in effect (meaning this test can be used) for the duration of the COVID-19 declaration under Section 564(b)(1) of the Act, 21 U.S.C.section 360bbb-3(b)(1), unless the authorization is terminated  or revoked sooner.       Influenza A by PCR NEGATIVE NEGATIVE   Influenza B by PCR NEGATIVE NEGATIVE    Comment: (NOTE) The Xpert Xpress SARS-CoV-2/FLU/RSV plus assay is intended as an aid in the diagnosis of influenza from Nasopharyngeal swab specimens and should not be used as a sole basis for treatment. Nasal washings and aspirates are unacceptable for Xpert Xpress SARS-CoV-2/FLU/RSV testing.  Fact Sheet for Patients: BloggerCourse.com  Fact Sheet for Healthcare Providers: SeriousBroker.it  This test is not yet approved or cleared by the [place] FDA and has been authorized for detection and/or diagnosis of SARS-CoV-2 by FDA under an Emergency Use Authorization (EUA). This EUA will remain in effect (meaning this test can be used) for the duration of the COVID-19 declaration under Section 564(b)(1) of the Act, 21 U.S.C. section 360bbb-3(b)(1), unless the authorization is terminated or revoked.  Performed at [facility], [address] [place], [place] [address]   Troponin I (High Sensitivity)     Status: None   Collection Time: 12/23/20  8:03 PM  Result Value Ref Range   Troponin I (High Sensitivity) 6 <18 ng/L    Comment: (NOTE) Elevated high sensitivity troponin I (hsTnI) values and significant  changes across serial measurements may suggest ACS but many other  chronic and acute conditions are known to elevate hsTnI results.  Refer to the "Links" section for chest pain algorithms and additional  guidance. Performed at [facility], [address], [place], [place] [address]   Aerobic Culture w Gram Stain (superficial specimen)     Status: None (Preliminary result)   Collection Time: 12/24/20  1:21 AM   Specimen: Foot  Result Value Ref Range   Specimen Description FOOT    Special Requests NONE    Gram Stain      NO WBC SEEN NO ORGANISMS SEEN Performed at [facility], [address] [place], [place] [address]    Culture PENDING    Report Status PENDING   Lipid panel     Status: Abnormal   Collection Time: 12/24/20  3:26 AM  Result Value Ref  Range   Cholesterol 90 0 - 200 mg/dL   Triglycerides 468 <032 mg/dL   HDL 29 (L) >12 mg/dL   Total CHOL/HDL Ratio 3.1 RATIO   VLDL 27 0 - 40 mg/dL   LDL Cholesterol 34 0 - 99 mg/dL    Comment:        Total Cholesterol/HDL:CHD Risk Coronary Heart Disease Risk Table                     Men   Women  1/2 Average Risk   3.4   3.3  Average Risk       5.0   4.4  2 X Average Risk   9.6   7.1  3 X Average Risk  23.4   11.0        Use the calculated Patient Ratio above and the CHD Risk Table to determine the patient's CHD Risk.        ATP III CLASSIFICATION (LDL):  <100     mg/dL   Optimal  248-250  mg/dL   Near or Above                    Optimal  130-159  mg/dL   Borderline  037-048  mg/dL   High  >889     mg/dL   Very High Performed at [facility], [address] [place], [place] [address]   Troponin I (High Sensitivity)     Status: None   Collection Time: 12/24/20  3:26 AM  Result Value Ref Range   Troponin I (High Sensitivity) 7 <18 ng/L    Comment: (NOTE) Elevated high sensitivity troponin I (hsTnI) values and significant  changes across serial measurements may suggest ACS but many other  chronic and acute conditions are known to elevate hsTnI results.  Refer to the "Links" section for chest pain algorithms and additional  guidance. Performed at [facility], [address] [place], [place] [address]     DG Chest 2 View  Result Date:  12/23/2020 CLINICAL DATA:  Chest pain. EXAM: CHEST - 2 VIEW COMPARISON:  Chest x-ray 12/06/2019. FINDINGS: There is central peribronchial wall thickening, unchanged. There is some minimal strandy lingular opacities. The lungs are otherwise clear. There is no pleural effusion or pneumothorax identified. The cardiomediastinal silhouette is within normal limits. Degenerative changes affect the spine. No acute fractures are identified. IMPRESSION: 1. Strandy opacities in the lingula may represent atelectasis or infection. 2. Mild bronchitic changes. Electronically Signed   By: Darliss Cheney M.D.   On: 12/23/2020 15:58    Review of Systems  Constitutional:  Negative for chills, diaphoresis and fever.  HENT:  Negative for ear discharge, ear pain, hearing loss and tinnitus.   Eyes:  Negative for photophobia and pain.  Respiratory:  Negative for cough and shortness of breath.   Cardiovascular:  Negative for chest pain.  Gastrointestinal:  Negative for abdominal pain, nausea and vomiting.  Genitourinary:  Negative for dysuria, flank pain, frequency and urgency.  Musculoskeletal:  Negative for back pain, myalgias and neck pain.  Neurological:  Positive for light-headedness. Negative for dizziness and headaches.  Hematological:  Does not bruise/bleed easily.  Psychiatric/Behavioral:  The patient is not nervous/anxious.   Blood pressure (!) 154/62, pulse 62, temperature 97.6 F (36.4 C), temperature source Oral, resp. rate 16, height 6' (1.829 m), weight 126.6 kg, SpO2 94 %. Physical Exam Constitutional:      General: He is not in  acute distress.    Appearance: He is well-developed. He is not diaphoretic.  HENT:     Head: Normocephalic and atraumatic.  Eyes:     General: No scleral icterus.       Right eye: No discharge.        Left eye: No discharge.     Conjunctiva/sclera: Conjunctivae normal.  Cardiovascular:     Rate and Rhythm: Normal rate and regular rhythm.  Pulmonary:     Effort: Pulmonary  effort is normal. No respiratory distress.  Musculoskeletal:     Cervical back: Normal range of motion.  Feet:     Comments: Right foot: s/p TMT amputation, multiple areas of lichenification, 1st MT ulceration, no discharge or odor, insensate, no palpable DP/PT pulses Skin:    General: Skin is warm and dry.  Neurological:     Mental Status: He is alert.  Psychiatric:        Mood and Affect: Mood normal.        Behavior: Behavior normal.    Assessment/Plan: Right foot ulcer -- Awaiting x-ray. May need MRI. Would recommend vascular surgery consult given they have been following him for PAD. Dr. Lajoyce Corners to evaluate later today.    Freeman Caldron, PA-C Orthopedic Surgery [phone] 12/24/2020, 12:43 PM

## 2020-12-24 NOTE — Consult Note (Addendum)
Hospital Consult    Reason for Consult: Right foot ulcer Requesting Physician:  [facility] MRN #:  [id number]  History of Present Illness: This is a 75 y.o. male admitted to the hospital for coronary rule out.  He was noted to have a right foot plantar ulceration this vascular surgery was consulted.  Surgical history significant for right femoral to above-the-knee popliteal bypass with vein by Dr. Randie Heinz on 08/18/2017.  He subsequently underwent right transmetatarsal amputation by Dr. Lajoyce Corners on 08/21/2017.  He was last seen by our office in April 2021 and has since been lost to follow-up.  Up until last year he had been active with the wound center for periodic wound care for slow to heal wounds of right foot.  He is unsure when current wound started.  He does not have much feeling in right leg chronically.  He denies any claudication or rest pain.  He quit smoking and drinking several years ago.  He is ambulatory with a cane.  Past Medical History:  Diagnosis Date   Abdominal aortic aneurysm    no AAA on [place] in 2020   CAD (coronary artery disease)    Stent to RCA 2021   Chronic obstructive pulmonary disease    HLD (hyperlipidemia)    HTN (hypertension)    Peripheral artery disease    s/p R fem pop // s/p R transmet amp    Past Surgical History:  Procedure Laterality Date   ABDOMINAL AORTOGRAM N/A 08/17/2017   Procedure: ABDOMINAL AORTOGRAM;  Surgeon: Maeola Harman, MD;  Location: [facility] INVASIVE CV LAB;  Service: Cardiovascular;  Laterality: N/A;   AMPUTATION Right 08/21/2017   Procedure: TRANSMETATARSAL AMPUTATION RIGHT FOOT;  Surgeon: Nadara Mustard, MD;  Location: [facility] OR;  Service: Orthopedics;  Laterality: Right;   AMPUTATION TOE Right 08/18/2017   Procedure: AMPUTATION RIGHT SECOND AND THIRD TOES;  Surgeon: Maeola Harman, MD;  Location: [facility] OR;  Service: Vascular;  Laterality: Right;   CORONARY BALLOON ANGIOPLASTY N/A 12/03/2019   Procedure: CORONARY BALLOON ANGIOPLASTY;  Surgeon:  Corky Crafts, MD;  Location: [facility] INVASIVE CV LAB;  Service: Cardiovascular;  Laterality: N/A;   CORONARY STENT INTERVENTION N/A 12/03/2019   Procedure: CORONARY STENT INTERVENTION;  Surgeon: Corky Crafts, MD;  Location: [facility] INVASIVE CV LAB;  Service: Cardiovascular;  Laterality: N/A;   ENDARTERECTOMY FEMORAL Right 08/18/2017   Procedure: ENDARTERECTOMY RIGHT SUPERFICIAL Alfredia Ferguson;  Surgeon: Maeola Harman, MD;  Location: [facility] OR;  Service: Vascular;  Laterality: Right;   FEMORAL-POPLITEAL BYPASS GRAFT Right 08/18/2017   Procedure: BYPASS GRAFT RIGHT COMMON FEMORAL TO ABOVE KNEE POPLITEAL ARTERY USING RIGHT REVERSED GREAT SAPHENOUS VEIN;  Surgeon: Maeola Harman, MD;  Location: [facility] OR;  Service: Vascular;  Laterality: Right;   LEFT HEART CATH AND CORONARY ANGIOGRAPHY N/A 12/03/2019   Procedure: LEFT HEART CATH AND CORONARY ANGIOGRAPHY;  Surgeon: Corky Crafts, MD;  Location: [facility] INVASIVE CV LAB;  Service: Cardiovascular;  Laterality: N/A;   LOWER EXTREMITY ANGIOGRAPHY Right 08/17/2017   Procedure: Lower Extremity Angiography;  Surgeon: Maeola Harman, MD;  Location: [facility] INVASIVE CV LAB;  Service: Cardiovascular;  Laterality: Right;   PERIPHERAL VASCULAR BALLOON ANGIOPLASTY Right 08/17/2017   Procedure: PERIPHERAL VASCULAR BALLOON ANGIOPLASTY;  Surgeon: Maeola Harman, MD;  Location: [facility] INVASIVE CV LAB;  Service: Cardiovascular;  Laterality: Right;  SFA UNABLE TO CROSS   VEIN HARVEST Right 08/18/2017   Procedure: VEIN HARVEST RIGHT GREAT SAPHENOUS;  Surgeon: Maeola Harman, MD;  Location: [facility] OR;  Service: Vascular;  Laterality: Right;   WOUND DEBRIDEMENT Right 08/18/2017   Procedure: DEBRIDEMENT WOUND RIGHT FOOT;  Surgeon: Maeola Harman, MD;  Location: [facility] OR;  Service: Vascular;  Laterality: Right;    Allergies  Allergen Reactions   Penicillins Hives and Rash    Childhood reaction Has patient had a PCN reaction causing  immediate rash, facial/tongue/throat swelling, SOB or lightheadedness with hypotension: No PT DEVELOPED ## SEVERE ## RASH INVOLVING MUCUS MEMBRANES or SKIN NECROSIS: #  #  YES  #  # Has patient had a PCN reaction that required hospitalization: Unknown Has patient had a PCN reaction occurring within the last 10 years: No   Clindamycin Rash    Diffuse rash across body    Prior to Admission medications   Medication Sig Start Date End Date Taking? Authorizing Provider  Albuterol Sulfate 108 (90 Base) MCG/ACT AEPB Inhale 90 mcg into the lungs.   Yes [provider]  aspirin 81 MG chewable tablet Chew 1 tablet (81 mg total) by mouth daily. 12/04/19  Yes Georgie Chard D, NP  BREZTRI AEROSPHERE 160-9-4.8 MCG/ACT AERO Inhale 2 puffs into the lungs 2 (two) times daily. 07/09/19  Yes [provider]  clopidogrel (PLAVIX) 75 MG tablet Take 1 tablet (75 mg total) by mouth daily. 09/29/20  Yes Wendall Stade, MD  losartan (COZAAR) 50 MG tablet Take 1 tablet by mouth once daily 12/08/20  Yes Netta Neat., NP  metoprolol tartrate (LOPRESSOR) 25 MG tablet Take 1 tablet by mouth twice daily 08/21/20  Yes Netta Neat., NP  Multiple Vitamins-Minerals (CENTRUM SILVER 50+MEN PO) Take by mouth.   Yes [provider]  nitroGLYCERIN (NITROSTAT) 0.4 MG SL tablet Place 1 tablet (0.4 mg total) under the tongue every 5 (five) minutes as needed for chest pain. 12/13/19 03/18/21 Yes Netta Neat., NP  rosuvastatin (CRESTOR) 40 MG tablet Take 1 tablet by mouth once daily 07/16/20  Yes Filbert Schilder, NP    Social History   Socioeconomic History   Marital status: Divorced    Spouse name: Not on file   Number of children: Not on file   Years of education: Not on file   Highest education level: Not on file  Occupational History   Not on file  Tobacco Use   Smoking status: Former   Smokeless tobacco: Never  Vaping Use   Vaping Use: Former  Substance and Sexual Activity    Alcohol use: Not Currently   Drug use: No   Sexual activity: Not on file  Other Topics Concern   Not on file  Social History Narrative   Not on file   Social Determinants of Health   Financial Resource Strain: Not on file  Food Insecurity: Not on file  Transportation Needs: Not on file  Physical Activity: Not on file  Stress: Not on file  Social Connections: Not on file  Intimate Partner Violence: Not on file     Family History  Adopted: Yes    ROS: Otherwise negative unless mentioned in HPI  Physical Examination  Vitals:   12/24/20 0548 12/24/20 1230  BP: 136/62 (!) 154/62  Pulse: 66 62  Resp: 18 16  Temp: 98.1 F (36.7 C) 97.6 F (36.4 C)  SpO2: 97% 94%   Body mass index is 37.84 kg/m.  General:  WDWN in NAD Gait: Not observed HENT: WNL, normocephalic Pulmonary: normal non-labored breathing, without Rales, rhonchi,  wheezing Cardiac: regular Abdomen:  soft, NT/ND,  no masses Skin: without rashes Vascular Exam/Pulses: Symmetrical femoral pulses; palpable right DP pulse Extremities: Necrotic ulceration of plantar foot; multiple areas of lichenification; malodorous Musculoskeletal: no muscle wasting or atrophy  Neurologic: A&O X 3;  No focal weakness or paresthesias are detected; speech is fluent/normal Psychiatric:  The pt has Normal affect. Lymph:  Unremarkable  CBC    Component Value Date/Time   WBC 8.2 12/23/2020 1520   RBC 4.83 12/23/2020 1520   HGB 13.3 12/23/2020 1520   HCT 42.5 12/23/2020 1520   HCT 32.8 (L) 08/12/2017 0822   PLT 129 (L) 12/23/2020 1520   MCV 88.0 12/23/2020 1520   MCH 27.5 12/23/2020 1520   MCHC 31.3 12/23/2020 1520   RDW 15.5 12/23/2020 1520   LYMPHSABS 1.2 12/01/2019 0105   MONOABS 0.8 12/01/2019 0105   EOSABS 0.1 12/01/2019 0105   BASOSABS 0.1 12/01/2019 0105    BMET    Component Value Date/Time   NA 136 12/23/2020 1520   K 3.8 12/23/2020 1520   CL 101 12/23/2020 1520   CO2 24 12/23/2020 1520   GLUCOSE 125 (H)  12/23/2020 1520   BUN 15 12/23/2020 1520   CREATININE 0.99 12/23/2020 1520   CALCIUM 9.6 12/23/2020 1520   GFRNONAA >60 12/23/2020 1520   GFRAA >60 12/06/2019 2128    COAGS: Lab Results  Component Value Date   INR 1.13 08/17/2017     Non-Invasive Vascular Imaging:   Imaging studies pending   ASSESSMENT/PLAN: This is a 75 y.o. male with plantar right foot ulceration with history of right femoral to above-the-knee popliteal artery bypass  -On exam patient has a palpable right DP pulse suggesting that the femoral to AK popliteal bypass remains patent.  We have ordered ABIs and right lower extremity arterial duplex to confirm this. -Pending imaging studies, the patient may be optimized from a vascular surgery standpoint.   -We will defer further debridement and wound care of right foot to Dr. Lajoyce Corners -Would continue aspirin, Plavix, statin -On-call vascular surgeon Dr. Chestine Spore was also involved with the evaluation and management plan of this patient and will provide further treatment plans pending noninvasive studies   Emilie Rutter PA-C [facility] [phone]  I have seen and evaluated the patient. I agree with the PA note as documented above.  75 year old male admitted for ACS rule out.  Vascular surgery was asked to see him for nonhealing right foot wound.  He is already status post revascularization of the right leg.  On 08/18/2017 he underwent a right common femoral to above-knee popliteal bypass with vein by Dr. Randie Heinz and then a right TMA by Dr. Lajoyce Corners also in 2019  He has a wound on the plantar surface of his foot and he is unclear about the duration of this.  No follow-up with [place] since last year.  He does have a palpable dorsalis pedis pulse in the right foot.  We will get noninvasive imaging including arterial duplex of the bypass as he does have a history of inflow stenosis and last seen in our office last year and bypass was patent.  I suspect with a palpable pulse  in his foot he has patent bypass and likely enough inflow to heal whatever orthopedics may have planned.  Cephus Shelling, MD [facility] [place] Office: [phone]

## 2020-12-24 NOTE — Progress Notes (Signed)
Notified by RN that when pt arrived on floor and sock removed he had cracking wound with foul smelling drainage.  Culture ordered.  Wound card consult ordered.  Given dose of Vancomycin to cover for infection

## 2020-12-24 NOTE — Consult Note (Signed)
ORTHOPAEDIC CONSULTATION  REQUESTING PHYSICIAN: Darlin Drop, DO  Chief Complaint: Chest pain with chronic venous insufficiency right lower extremity.    HPI: Jorge Clements is a 75 y.o. male who presents with acute chest pain concern for cardiac event.  Patient has had peripheral vascular disease and is status post coronary artery angioplasty as well as bypass graft to the right lower extremity.  Patient has undergone a transmetatarsal amputation of the right about 3 years ago.  Patient has been going to a wound center in [place].  Patient states that he has used compression in the past but is not currently using compression.  Patient felt like diuretics in the past have helped his leg.  Past Medical History:  Diagnosis Date   Abdominal aortic aneurysm    no AAA on [place] in 2020   CAD (coronary artery disease)    Stent to RCA 2021   Chronic obstructive pulmonary disease    HLD (hyperlipidemia)    HTN (hypertension)    Peripheral artery disease    s/p R fem pop // s/p R transmet amp   Past Surgical History:  Procedure Laterality Date   ABDOMINAL AORTOGRAM N/A 08/17/2017   Procedure: ABDOMINAL AORTOGRAM;  Surgeon: Maeola Harman, MD;  Location: [facility] INVASIVE CV LAB;  Service: Cardiovascular;  Laterality: N/A;   AMPUTATION Right 08/21/2017   Procedure: TRANSMETATARSAL AMPUTATION RIGHT FOOT;  Surgeon: Nadara Mustard, MD;  Location: [facility] OR;  Service: Orthopedics;  Laterality: Right;   AMPUTATION TOE Right 08/18/2017   Procedure: AMPUTATION RIGHT SECOND AND THIRD TOES;  Surgeon: Maeola Harman, MD;  Location: [facility] OR;  Service: Vascular;  Laterality: Right;   CORONARY BALLOON ANGIOPLASTY N/A 12/03/2019   Procedure: CORONARY BALLOON ANGIOPLASTY;  Surgeon: Corky Crafts, MD;  Location: [facility] INVASIVE CV LAB;  Service: Cardiovascular;  Laterality: N/A;   CORONARY STENT INTERVENTION N/A 12/03/2019   Procedure: CORONARY STENT INTERVENTION;  Surgeon: Corky Crafts, MD;   Location: [facility] INVASIVE CV LAB;  Service: Cardiovascular;  Laterality: N/A;   ENDARTERECTOMY FEMORAL Right 08/18/2017   Procedure: ENDARTERECTOMY RIGHT SUPERFICIAL Alfredia Ferguson;  Surgeon: Maeola Harman, MD;  Location: [facility] OR;  Service: Vascular;  Laterality: Right;   FEMORAL-POPLITEAL BYPASS GRAFT Right 08/18/2017   Procedure: BYPASS GRAFT RIGHT COMMON FEMORAL TO ABOVE KNEE POPLITEAL ARTERY USING RIGHT REVERSED GREAT SAPHENOUS VEIN;  Surgeon: Maeola Harman, MD;  Location: [facility] OR;  Service: Vascular;  Laterality: Right;   LEFT HEART CATH AND CORONARY ANGIOGRAPHY N/A 12/03/2019   Procedure: LEFT HEART CATH AND CORONARY ANGIOGRAPHY;  Surgeon: Corky Crafts, MD;  Location: [facility] INVASIVE CV LAB;  Service: Cardiovascular;  Laterality: N/A;   LOWER EXTREMITY ANGIOGRAPHY Right 08/17/2017   Procedure: Lower Extremity Angiography;  Surgeon: Maeola Harman, MD;  Location: [facility] INVASIVE CV LAB;  Service: Cardiovascular;  Laterality: Right;   PERIPHERAL VASCULAR BALLOON ANGIOPLASTY Right 08/17/2017   Procedure: PERIPHERAL VASCULAR BALLOON ANGIOPLASTY;  Surgeon: Maeola Harman, MD;  Location: [facility] INVASIVE CV LAB;  Service: Cardiovascular;  Laterality: Right;  SFA UNABLE TO CROSS   VEIN HARVEST Right 08/18/2017   Procedure: VEIN HARVEST RIGHT GREAT SAPHENOUS;  Surgeon: Maeola Harman, MD;  Location: [facility] OR;  Service: Vascular;  Laterality: Right;   WOUND DEBRIDEMENT Right 08/18/2017   Procedure: DEBRIDEMENT WOUND RIGHT FOOT;  Surgeon: Maeola Harman, MD;  Location: [facility] OR;  Service: Vascular;  Laterality: Right;   Social History   Socioeconomic History   Marital status: Divorced  Spouse name: Not on file   Number of children: Not on file   Years of education: Not on file   Highest education level: Not on file  Occupational History   Not on file  Tobacco Use   Smoking status: Former   Smokeless tobacco: Never  Vaping Use   Vaping Use: Former   Substance and Sexual Activity   Alcohol use: Not Currently   Drug use: No   Sexual activity: Not on file  Other Topics Concern   Not on file  Social History Narrative   Not on file   Social Determinants of Health   Financial Resource Strain: Not on file  Food Insecurity: Not on file  Transportation Needs: Not on file  Physical Activity: Not on file  Stress: Not on file  Social Connections: Not on file   Family History  Adopted: Yes   - negative except otherwise stated in the family history section Allergies  Allergen Reactions   Penicillins Hives and Rash    Childhood reaction Has patient had a PCN reaction causing immediate rash, facial/tongue/throat swelling, SOB or lightheadedness with hypotension: No PT DEVELOPED ## SEVERE ## RASH INVOLVING MUCUS MEMBRANES or SKIN NECROSIS: #  #  YES  #  # Has patient had a PCN reaction that required hospitalization: Unknown Has patient had a PCN reaction occurring within the last 10 years: No   Clindamycin Rash    Diffuse rash across body   Prior to Admission medications   Medication Sig Start Date End Date Taking? Authorizing Provider  Albuterol Sulfate 108 (90 Base) MCG/ACT AEPB Inhale 90 mcg into the lungs.   Yes [provider]  aspirin 81 MG chewable tablet Chew 1 tablet (81 mg total) by mouth daily. 12/04/19  Yes Georgie Chard D, NP  BREZTRI AEROSPHERE 160-9-4.8 MCG/ACT AERO Inhale 2 puffs into the lungs 2 (two) times daily. 07/09/19  Yes [provider]  clopidogrel (PLAVIX) 75 MG tablet Take 1 tablet (75 mg total) by mouth daily. 09/29/20  Yes Wendall Stade, MD  losartan (COZAAR) 50 MG tablet Take 1 tablet by mouth once daily 12/08/20  Yes Netta Neat., NP  metoprolol tartrate (LOPRESSOR) 25 MG tablet Take 1 tablet by mouth twice daily 08/21/20  Yes Netta Neat., NP  Multiple Vitamins-Minerals (CENTRUM SILVER 50+MEN PO) Take by mouth.   Yes [provider]  nitroGLYCERIN (NITROSTAT) 0.4 MG  SL tablet Place 1 tablet (0.4 mg total) under the tongue every 5 (five) minutes as needed for chest pain. 12/13/19 03/18/21 Yes Netta Neat., NP  rosuvastatin (CRESTOR) 40 MG tablet Take 1 tablet by mouth once daily 07/16/20  Yes Filbert Schilder, NP   DG Chest 2 View  Result Date: 12/23/2020 CLINICAL DATA:  Chest pain. EXAM: CHEST - 2 VIEW COMPARISON:  Chest x-ray 12/06/2019. FINDINGS: There is central peribronchial wall thickening, unchanged. There is some minimal strandy lingular opacities. The lungs are otherwise clear. There is no pleural effusion or pneumothorax identified. The cardiomediastinal silhouette is within normal limits. Degenerative changes affect the spine. No acute fractures are identified. IMPRESSION: 1. Strandy opacities in the lingula may represent atelectasis or infection. 2. Mild bronchitic changes. Electronically Signed   By: Darliss Cheney M.D.   On: 12/23/2020 15:58   DG Foot Complete Right  Result Date: 12/24/2020 CLINICAL DATA:  Wound cellulitis. EXAM: RIGHT FOOT COMPLETE - 3+ VIEW COMPARISON:  Radiographs 06/16/2018. FINDINGS: Status post remote transmetatarsal amputation through the bases  of all the metatarsals. Possible interval slight extension of the 1st metatarsal amputation. No evidence of acute bone destruction, acute fracture or dislocation. Mild prominence of the soft tissues appear similar to the previous study. No foreign body or definite soft tissue emphysema. IMPRESSION: Postsurgical changes from previous transmetatarsal amputation. No evidence of osteomyelitis or foreign body. Electronically Signed   By: Carey Bullocks M.D.   On: 12/24/2020 14:07   - pertinent xrays, CT, MRI studies were reviewed and independently interpreted  Positive ROS: All other systems have been reviewed and were otherwise negative with the exception of those mentioned in the HPI and as above.  Physical Exam: General: Alert, no acute distress Psychiatric: Patient is competent for  consent with normal mood and affect Lymphatic: No axillary or cervical lymphadenopathy Cardiovascular: No pedal edema Respiratory: No cyanosis, no use of accessory musculature GI: No organomegaly, abdomen is soft and non-tender    Images:  @ENCIMAGES @  Labs:  Lab Results  Component Value Date   HGBA1C 6.4 (H) 12/02/2019   ESRSEDRATE 40 (H) 12/24/2020   CRP 1.9 (H) 12/24/2020   REPTSTATUS PENDING 12/24/2020   GRAMSTAIN  12/24/2020    NO WBC SEEN NO ORGANISMS SEEN Performed at [facility], [address], [place], [place]    CULT PENDING 12/24/2020    Lab Results  Component Value Date   ALBUMIN 3.9 12/01/2019   ALBUMIN 3.7 09/26/2017   ALBUMIN 2.3 (L) 08/17/2017     CBC EXTENDED Latest Ref Rng & Units 12/23/2020 12/06/2019 12/04/2019  WBC 4.0 - 10.5 K/uL 8.2 12.2(H) 8.3  RBC 4.22 - 5.81 MIL/uL 4.83 4.66 4.32  HGB 13.0 - 17.0 g/dL 12/06/2019 29.7 12.6(L)  HCT 39.0 - 52.0 % 42.5 41.5 38.9(L)  PLT 150 - 400 K/uL 129(L) 150 125(L)  NEUTROABS 1.7 - 7.7 K/uL - - -  LYMPHSABS 0.7 - 4.0 K/uL - - -    Neurologic: Patient does not have protective sensation bilateral lower extremities.   MUSCULOSKELETAL:   Skin: Examination patient has massive thick callus tissue involving the entire right transmetatarsal amputation there is an ulcer plantarly that appears superficial.  There is brawny skin color changes in the right leg as well as pitting edema there is no cellulitis.  Reviewing the radiographs shows a stable transmetatarsal amputation without evidence of osteomyelitis.  Patient is status post ankle-brachial indices in April 2021 which showed an ABI of approximately 75% with monophasic flow.  This test was reordered in April of this year but study was not obtained.  A repeat ankle-brachial indices is ordered on this admission.  Assessment: Assessment: Peripheral vascular disease with massive callus formation on the right foot with chronic venous  insufficiency.  Plan: I would have the wound ostomy continence nurses apply a Profore wrap after ABIs are obtained.  If there is a significant decrease in the arterial circulation then would hold on the compression wrap until vascular surgery determines their treatment plan.  I will follow-up in the office in 1 week.  No indication for orthopedic surgery at this time.  Thank you for the consult and the opportunity to see Mr. May, MD Dorien Chihuahua [phone] 5:52 PM

## 2020-12-24 NOTE — Progress Notes (Signed)
Pharmacy Antibiotic Note  Jorge Clements is a 75 y.o. male admitted on 12/23/2020 with CP, during nurse assessment a foul-smelling wound was discovered on pt's foot.  Pharmacy has been consulted for vancomycin dosing.  Plan: Vancomycin 2000mg  x1 then 1000mg  IV Q12H. Goal AUC 400-550.  Expected AUC 490.  SCr 0.99.   Height: 6' (182.9 cm) Weight: 126.6 kg (279 lb) IBW/kg (Calculated) : 77.6  Temp (24hrs), Avg:97.7 F (36.5 C), Min:97.7 F (36.5 C), Max:97.8 F (36.6 C)  Recent Labs  Lab 12/23/20 1520  WBC 8.2  CREATININE 0.99    Estimated Creatinine Clearance: 91.4 mL/min (by C-G formula based on SCr of 0.99 mg/dL).    Allergies  Allergen Reactions   Penicillins Hives and Rash    Childhood reaction Has patient had a PCN reaction causing immediate rash, facial/tongue/throat swelling, SOB or lightheadedness with hypotension: No PT DEVELOPED ## SEVERE ## RASH INVOLVING MUCUS MEMBRANES or SKIN NECROSIS: #  #  YES  #  # Has patient had a PCN reaction that required hospitalization: Unknown Has patient had a PCN reaction occurring within the last 10 years: No   Clindamycin Rash    Diffuse rash across body     Thank you for allowing pharmacy to be a part of this patient's care.  , PharmD, BCPS  12/24/2020 1:49 AM

## 2020-12-24 NOTE — Progress Notes (Signed)
PROGRESS NOTE  Jorge Clements IBB:[id number] DOB: [birth year] DOA: 12/23/2020 PCP: Celene Squibb, MD  HPI/Recap of past 24 hours: Jorge Clements is a 75 y.o. male with medical history significant of PAD, HTN, HLD, CAD s/p stent to RCA for NSTEMI 1 year ago who presented with complaints of lightheadedness, left arm tingling numbness, symptoms do not similar to his previous NSTEMI last year.  Denies having any chest pain or dyspnea.  Troponin x2 negative.  He stated he has never had chest pain even when he was diagnosed with an NSTEMI last year.  On admission patient was noted to have full-thickness ulceration on plantar aspect of first metatarsal head status post transmetatarsal amputation in 2019 by Dr. Sharol Given.  Patient has also been seen in the past by vascular surgery Dr. Donzetta Matters for femoropopliteal bypass.  Patient was seen by wound care specialist with recommendation for orthopedic or vascular surgery involvement.  12/24/2020: Patient was seen and examined at his bedside.  He denies having any pain in his feet.  States he has no sensation to his feet.  His cardiac related symptoms are improved.  Due to concern for infection to his right foot, x-ray, CRP and ESR were ordered.  Orthopedic surgery consulted to assist with the management.    Assessment/Plan: Principal Problem:   Chest pain, rule out acute myocardial infarction Active Problems:   Essential hypertension   PAD (peripheral artery disease) (HCC)   HLD (hyperlipidemia)  Lightheadedness/left arm tingling numbness Self reports symptoms are similar to prior NSTEMI diagnosed last year Troponin negative x2 No evidence of acute ischemia on twelve-lead EKG Symptoms improved this morning  Right foot infection post transmetatarsal amputation in 2019 Lost to follow-up Stopped going to wound care center in 2020 due to lack of funds Right foot x-ray, ESR, CRP ordered MRSA screening Was started on IV vancomycin empirically on 12/24/2020 Orthopedic  surgery consulted  Peripheral artery disease Has been seen in the past by vascular surgery for femoropopliteal bypass Continue home aspirin, Plavix and Crestor.  Hypertension BP is currently at goal Continue losartan, metoprolol  Code Status: Full code  Family Communication: None at bedside  Disposition Plan: We will discharge to home with home health services once orthopedic surgery signs off.   Consultants: Orthopedic surgery  Procedures: None.  Antimicrobials: IV vancomycin  DVT prophylaxis: Subcu Lovenox daily  Status is: Observation    Dispo: The patient is from: Home.               Anticipated d/c is to: Home               Patient currently not medically stable for discharge.  Awaiting orthopedic surgery's assessment.   Difficult to place patient, not applicable.        Objective: Vitals:   12/24/20 0000 12/24/20 0030 12/24/20 0105 12/24/20 0548  BP: (!) 162/59  106/78 136/62  Pulse: 61  87 66  Resp: _0 Temp:  97.7 F (36.5 C) 97.7 F (36.5 C) 98.1 F (36.7 C)  TempSrc:  Oral Oral Oral  SpO2: 96%  100% 97%  Weight:   126.6 kg   Height:   6' (1.829 m)    No intake or output data in the 24 hours ending 12/24/20 1154 Filed Weights   12/24/20 0105  Weight: 126.6 kg    Exam:  General: 75 y.o. year-old male well developed well nourished in no acute distress.  Alert and oriented x3. Cardiovascular: Regular rate and  rhythm with no rubs or gallops.  No thyromegaly or JVD noted.   Respiratory: Clear to auscultation with no wheezes or rales. Good inspiratory effort. Abdomen: Soft nontender nondistended with normal bowel sounds x4 quadrants. Musculoskeletal: Status post right transmetatarsal amputation.  Trace lower extremity edema. Skin: Nonhealing lesions involving right lower extremity.  Full-thickness ulceration on plantar aspect of first metatarsal head. Psychiatry: Mood is appropriate for condition and setting   Data  Reviewed: CBC: Recent Labs  Lab 12/23/20 1520  WBC 8.2  HGB 13.3  HCT 42.5  MCV 88.0  PLT 245*   Basic Metabolic Panel: Recent Labs  Lab 12/23/20 1520  NA 136  K 3.8  CL 101  CO2 24  GLUCOSE 125*  BUN 15  CREATININE 0.99  CALCIUM 9.6   GFR: Estimated Creatinine Clearance: 91.4 mL/min (by C-G formula based on SCr of 0.99 mg/dL). Liver Function Tests: No results for input(s): AST, ALT, ALKPHOS, BILITOT, PROT, ALBUMIN in the last 168 hours. No results for input(s): LIPASE, AMYLASE in the last 168 hours. No results for input(s): AMMONIA in the last 168 hours. Coagulation Profile: No results for input(s): INR, PROTIME in the last 168 hours. Cardiac Enzymes: No results for input(s): CKTOTAL, CKMB, CKMBINDEX, TROPONINI in the last 168 hours. BNP (last 3 results) No results for input(s): PROBNP in the last 8760 hours. HbA1C: No results for input(s): HGBA1C in the last 72 hours. CBG: No results for input(s): GLUCAP in the last 168 hours. Lipid Profile: Recent Labs    12/24/20 0326  CHOL 90  HDL 29*  LDLCALC 34  TRIG 135  CHOLHDL 3.1   Thyroid Function Tests: No results for input(s): TSH, T4TOTAL, FREET4, T3FREE, THYROIDAB in the last 72 hours. Anemia Panel: No results for input(s): VITAMINB12, FOLATE, FERRITIN, TIBC, IRON, RETICCTPCT in the last 72 hours. Urine analysis:    Component Value Date/Time   COLORURINE YELLOW 08/11/2017 0642   APPEARANCEUR CLEAR 08/11/2017 0642   LABSPEC 1.017 08/11/2017 0642   PHURINE 6.0 08/11/2017 0642   GLUCOSEU NEGATIVE 08/11/2017 0642   HGBUR MODERATE (A) 08/11/2017 0642   BILIRUBINUR NEGATIVE 08/11/2017 0642   KETONESUR 5 (A) 08/11/2017 0642   PROTEINUR 30 (A) 08/11/2017 0642   UROBILINOGEN 0.2 11/17/2013 1301   NITRITE NEGATIVE 08/11/2017 0642   LEUKOCYTESUR NEGATIVE 08/11/2017 0642   Sepsis Labs: _0 (procalcitonin:4,lacticidven:4)  ) Recent Results (from the past 240 hour(s))  Resp Panel by RT-PCR (Flu A&B,  Covid) Nasopharyngeal Swab     Status: None   Collection Time: 12/23/20  7:15 PM   Specimen: Nasopharyngeal Swab; Nasopharyngeal(NP) swabs in vial transport medium  Result Value Ref Range Status   SARS Coronavirus 2 by RT PCR NEGATIVE NEGATIVE Final    Comment: (NOTE) SARS-CoV-2 target nucleic acids are NOT DETECTED.  The SARS-CoV-2 RNA is generally detectable in upper respiratory specimens during the acute phase of infection. The lowest concentration of SARS-CoV-2 viral copies this assay can detect is 138 copies/mL. A negative result does not preclude SARS-Cov-2 infection and should not be used as the sole basis for treatment or other patient management decisions. A negative result may occur with  improper specimen collection/handling, submission of specimen other than nasopharyngeal swab, presence of viral mutation(s) within the areas targeted by this assay, and inadequate number of viral copies(<138 copies/mL). A negative result must be combined with clinical observations, patient history, and epidemiological information. The expected result is Negative.  Fact Sheet for Patients:  EntrepreneurPulse.com.au  Fact Sheet for Healthcare Providers:  IncredibleEmployment.be  This test is no t yet approved or cleared by the [place] and  has been authorized for detection and/or diagnosis of SARS-CoV-2 by FDA under an Emergency Use Authorization (EUA). This EUA will remain  in effect (meaning this test can be used) for the duration of the COVID-19 declaration under Section 564(b)(1) of the Act, 21 U.S.C.section 360bbb-3(b)(1), unless the authorization is terminated  or revoked sooner.       Influenza A by PCR NEGATIVE NEGATIVE Final   Influenza B by PCR NEGATIVE NEGATIVE Final    Comment: (NOTE) The Xpert Xpress SARS-CoV-2/FLU/RSV plus assay is intended as an aid in the diagnosis of influenza from Nasopharyngeal swab specimens and should  not be used as a sole basis for treatment. Nasal washings and aspirates are unacceptable for Xpert Xpress SARS-CoV-2/FLU/RSV testing.  Fact Sheet for Patients: EntrepreneurPulse.com.au  Fact Sheet for Healthcare Providers: IncredibleEmployment.be  This test is not yet approved or cleared by the [place] FDA and has been authorized for detection and/or diagnosis of SARS-CoV-2 by FDA under an Emergency Use Authorization (EUA). This EUA will remain in effect (meaning this test can be used) for the duration of the COVID-19 declaration under Section 564(b)(1) of the Act, 21 U.S.C. section 360bbb-3(b)(1), unless the authorization is terminated or revoked.  Performed at [facility], [place] [address], [place], [place] [address]   Aerobic Culture w Gram Stain (superficial specimen)     Status: None (Preliminary result)   Collection Time: 12/24/20  1:21 AM   Specimen: Foot  Result Value Ref Range Status   Specimen Description FOOT  Final   Special Requests NONE  Final   Gram Stain   Final    NO WBC SEEN NO ORGANISMS SEEN Performed at [facility], [place] [address], [place], [place] [address]    Culture PENDING  Incomplete   Report Status PENDING  Incomplete      Studies: DG Chest 2 View  Result Date: 12/23/2020 CLINICAL DATA:  Chest pain. EXAM: CHEST - 2 VIEW COMPARISON:  Chest x-ray 12/06/2019. FINDINGS: There is central peribronchial wall thickening, unchanged. There is some minimal strandy lingular opacities. The lungs are otherwise clear. There is no pleural effusion or pneumothorax identified. The cardiomediastinal silhouette is within normal limits. Degenerative changes affect the spine. No acute fractures are identified. IMPRESSION: 1. Strandy opacities in the lingula may represent atelectasis or infection. 2. Mild bronchitic changes. Electronically Signed   By: Ronney Asters M.D.   On: 12/23/2020 15:58    Scheduled Meds:   aspirin  81 mg Oral Daily   clopidogrel  75 mg Oral Daily   enoxaparin (LOVENOX) injection  40 mg Subcutaneous Q24H   fluticasone furoate-vilanterol  1 puff Inhalation Daily   losartan  50 mg Oral Daily   metoprolol tartrate  25 mg Oral BID   rosuvastatin  40 mg Oral Daily   umeclidinium bromide  1 puff Inhalation Daily    Continuous Infusions:  vancomycin 1,000 mg (12/24/20 1121)     LOS: 0 days     Kayleen Memos, MD [facility] Pager [phone]  If 7PM-7AM, please contact night-coverage [email or web address] Password [facility] 12/24/2020, 11:54 AM

## 2020-12-24 NOTE — Consult Note (Signed)
WOC Nurse Consult Note: Reason for Consult:Chronic, nonhealing lesions on Right LE. Full thickness ulceration on plantar aspect, at 1st metatarsal head, s/p transmetatarsal amputation in 2019 by Dr. Lajoyce Corners. Fem/pop by pass by Vascular (Dr. Randie Heinz). Thick calluses to foot. Edema.  Patient states that "years ago" he underwent treatments consisting of three times weekly visit to an outpatient setting. The ulcerations and calluses all returned. Wound type: full thickness Pressure Injury POA: N/A Measurement:Ulcer at right plantar foot measures 3cm x 3cm with depth of 0.3cm.  Wound bed:red, macerated periphery Drainage (amount, consistency, odor) serosanguinous in scant amount, malodorous Periwound: as noted above plus with thick calluses over foot with suspected wounds beneath. Dressing procedure/placement/frequency:  I have communicated with Dr. Margo Aye regarding my recommendations for care including a recommendation for consultation with either Orthopedics (Dr. Lajoyce Corners) or Vascular Surgery (Dr. Randie Heinz) for the extremity issues.   Conservative guidance for the plantar wound is provided in the interim consisting of twice daily cleansing with soap and water, rinsing and drying followed by placement of a normal saline moistened gauze topped with an ABD pad and secured with a Kerlix roll gauze wrapped from foot to just below knee/paper tape.  The Kerlix is to be covered with an ACE bandage wrapped in a similar manner for edema management.  WOC nursing team will not follow, but will remain available to this patient, the nursing and medical teams.  Please re-consult if needed.  Thank you for this consultation. Ladona Mow, MSN, RN, GNP, [place] [facility]  Pager# [phone]

## 2020-12-25 ENCOUNTER — Inpatient Hospital Stay (HOSPITAL_COMMUNITY): Payer: Medicare HMO

## 2020-12-25 ENCOUNTER — Other Ambulatory Visit: Payer: Self-pay | Admitting: Medical

## 2020-12-25 DIAGNOSIS — I70232 Atherosclerosis of native arteries of right leg with ulceration of calf: Secondary | ICD-10-CM

## 2020-12-25 DIAGNOSIS — I70233 Atherosclerosis of native arteries of right leg with ulceration of ankle: Secondary | ICD-10-CM | POA: Diagnosis not present

## 2020-12-25 DIAGNOSIS — R079 Chest pain, unspecified: Secondary | ICD-10-CM | POA: Diagnosis not present

## 2020-12-25 DIAGNOSIS — R072 Precordial pain: Secondary | ICD-10-CM

## 2020-12-25 DIAGNOSIS — I70234 Atherosclerosis of native arteries of right leg with ulceration of heel and midfoot: Secondary | ICD-10-CM

## 2020-12-25 DIAGNOSIS — Z95828 Presence of other vascular implants and grafts: Secondary | ICD-10-CM | POA: Diagnosis not present

## 2020-12-25 DIAGNOSIS — I70244 Atherosclerosis of native arteries of left leg with ulceration of heel and midfoot: Secondary | ICD-10-CM | POA: Diagnosis not present

## 2020-12-25 LAB — BASIC METABOLIC PANEL
Anion gap: 6 (ref 5–15)
BUN: 19 mg/dL (ref 8–23)
CO2: 26 mmol/L (ref 22–32)
Calcium: 8.8 mg/dL — ABNORMAL LOW (ref 8.9–10.3)
Chloride: 102 mmol/L (ref 98–111)
Creatinine, Ser: 1.06 mg/dL (ref 0.61–1.24)
GFR, Estimated: >60 mL/min (ref >60)
Glucose, Bld: 111 mg/dL — ABNORMAL HIGH (ref 70–99)
Potassium: 3.9 mmol/L (ref 3.5–5.1)
Sodium: 134 mmol/L — ABNORMAL LOW (ref 135–145)

## 2020-12-25 LAB — CBC
HCT: 38.1 % — ABNORMAL LOW (ref 39.0–52.0)
Hemoglobin: 12.4 g/dL — ABNORMAL LOW (ref 13.0–17.0)
MCH: 28.7 pg (ref 26.0–34.0)
MCHC: 32.5 g/dL (ref 30.0–36.0)
MCV: 88.2 fL (ref 80.0–100.0)
Platelets: 118 10*3/uL — ABNORMAL LOW (ref 150–400)
RBC: 4.32 MIL/uL (ref 4.22–5.81)
RDW: 15.6 % — ABNORMAL HIGH (ref 11.5–15.5)
WBC: 6.8 10*3/uL (ref 4.0–10.5)
nRBC: 0 % (ref 0.0–0.2)

## 2020-12-25 NOTE — Progress Notes (Signed)
Pt has orders for discharge-instructions gone over with pt and all questions answered-IV Dc'd -pt dressed in his own clothes-waiting for ride

## 2020-12-25 NOTE — Discharge Instructions (Signed)
° °  You have a Stress Test scheduled at [facility]. Your doctor has ordered this test to check the blood flow in your heart arteries. ° °Please arrive 15 minutes early for paperwork. The whole test will take several hours. You may want to bring reading material to remain occupied while undergoing different parts of the test. ° °Instructions: °· No food/drink after midnight the night before. °· It is OK to take your morning meds with a sip of water EXCEPT for those types of medicines listed below or otherwise instructed. °· No caffeine/decaf products 24 hours before, including medicines such as Excedrin or Goody Powders. Call if there are any questions.  °· Wear comfortable clothes and shoes.  ° ° °What To Expect: °When you arrive in the lab, the technician will inject a small amount of radioactive tracer into your arm through an IV while you are resting quietly. This helps us to form pictures of your heart. You will likely only feel a sting from the IV. After a waiting period, resting pictures will be obtained under a big camera. These are the "before" pictures. ° °Next, you will be prepped for the stress portion of the test. This may include either walking on a treadmill or receiving a medicine that helps to dilate blood vessels in your heart to simulate the effect of exercise on your heart. If you are walking on a treadmill, you will walk at different paces to try to get your heart rate to a goal number that is based on your age. If your doctor has chosen the pharmacologic test, then you will receive a medicine through your IV that may cause temporary nausea, flushing, shortness of breath and sometimes chest discomfort or vomiting. This is typically short-lived and usually resolves quickly. If you experience symptoms, that does not automatically mean the test is abnormal. Some patients do not experience any symptoms at all. Your blood pressure and heart rate will be monitored, and we will  be watching your EKG on a computer screen for any changes. During this portion of the test, the radiologist will inject another small amount of radioactive tracer into your IV. After a waiting period, you will undergo a second set of pictures. These are the "after" pictures. ° °The doctor reading the test will compare the before-and-after images to look for evidence of heart blockages or heart weakness. The test usually takes 1 day to complete, but in certain instances (for example, if a patient is over a certain weight limit), the test may be done over the span of 2 days. ° ° °

## 2020-12-25 NOTE — Progress Notes (Signed)
VASCULAR LAB    Right lower extremity arterial duplex has been performed.  See CV proc for preliminary results.   Mert Dietrick, RVT 12/25/2020, 11:18 AM

## 2020-12-25 NOTE — Progress Notes (Signed)
    Right foot plantar ulcer.  S/P TMA by Dr. Lajoyce Corners with venous insufficiency.    Pending arterial duplex/ABI's Palpable right DP pulse on exam   A/P Plantar callus without drainage or erythema.  S/P TMA by Dr. Lajoyce Corners Palpable pedal pulse right DP Pending non invasive studies  Mosetta Pigeon PA-C

## 2020-12-25 NOTE — Consult Note (Addendum)
WOC Nurse Consult Note: Reason for Consult:Asked to wrap Profore compression to right lower extremity after ABI is performed.  The ABI showed mild right lower extremity arterial disease.  Left extremity showed moderate arterial disease. Orders to wrap right leg only. Has worn compression in the past. I explain that this needs to be changed weekly.  He is to follow up with Dr Lajoyce Corners in a week.  Wound type: chronic vascular disease.  History right transmetatarsal amputation Pressure Injury POA: NA Measurement: raised plaques/lichenification of TMA site (healed incision) and to right dorsal foot.  Has offloading shoe he wears at home.  Chronic skin changes to right anterior lower leg.  Right plantar foot with 3 cm round nonintact lesion surrounded by callous to foot.  Will add calcium alginate to this.  Wound YPP:[id number] skin Drainage (amount, consistency, odor) none Periwound:NA Dressing procedure/placement/frequency:CLeanse right leg with soap and water and moisturize skin.  Wrap 4 layer Profore to right lower leg.  Change weekly.  (To follow up with Dr Lajoyce Corners in one week) Will not follow at this time.  Please re-consult if needed.  [place] MSN, RN, FNP-BC CWON Wound, Ostomy, Continence Nurse Pager [phone]

## 2020-12-25 NOTE — Consult Note (Addendum)
Cardiology Consultation:   Patient ID: Oland Arquette MRN: [id number]; DOB: 28-May-1945  Admit date: 12/23/2020 Date of Consult: 12/25/2020  PCP:  Celene Squibb, MD   [facility] Providers Cardiologist:  Jenkins Rouge, MD        Patient Profile:   Davionne Dowty is a 75 y.o. male with a PMH of CAD s/p PCI/DES to RCA 11/2019, PAD s/p right fem-pop bypass and right TMA, HTN, HLD, COPD,  who is being seen 12/25/2020 for the evaluation of anginal equivalent at the request of the patient via Dr. Nevada Crane.  History of Present Illness:   Mr. Briggs was admitted to the hospital 11/2019 for NSTEMI after presenting with sudden onset lightheadedness and left-sided chest pain/tightness.  He was found to have elevated HsTrop which peaked at 188 though EKG remained nonischemic.  He underwent a LHC which showed 99% stenosis of RCA at the high bifurcation managed with PCI/DES, 99% stenosis of RPAV managed with thrombectomy and kissing balloon angioplasty with 40% residual stenosis, 25% proximal to mid LAD stenosis, and 25% proximal RCA stenosis.  Echo that admission showed EF 65 to 70%, no RWMA, normal LV diastolic function, normal RV size/function, and no significant valvular abnormalities.  He was last seen by cardiology at an outpatient visit with Dr. Johnsie Cancel 09/29/2020 at which time he noted dyspnea and insomnia which she attributed to his Brilinta.  Otherwise he had no cardiac complaints.  Decision made to transition to Plavix for ongoing DAPT.  He presented to the ED 12/23/2020 with complaints of left hand numbness, generalized weakness, lightheadedness, and mild chest pain which she reported felt similar to his NSTEMI last year.  He has been intermittently hypertensive, otherwise vitals stable.  Labs notable for electrolytes WNL, creatinine 1.0, hemoglobin 12.4, platelet 118, ESR/CRP elevated, LDL 34, HsTrop negative x4. EKG showed sinus rhythm with first-degree AV block, rate 64 bpm, nonspecific ST to T wave  abnormalities, no significant change from previous.  CXR showed strandy opacities in the lingula-atelectasis versus infection.  He was admitted to medicine for observation.  Upon arrival to the floor RN remove sock and found right foot with significant cracking and wound with foul-smelling drainage.  He was started on IV antibiotics.  XR right foot showed previous TMA without evidence of osteomyelitis.  Ortho consulted and recommended vascular surgery involvement given history of PAD.  Vascular surgery and recommended ABIs/LAE's of the right leg which have been completed, results pending.  Wound care nurse is following.  Patient requested cardiology evaluation as presenting symptoms are concerning for anginal equivalent.  At the time of this evaluation he is chest pain-free.  He notes some shortness of breath which he attributes to his COPD and not receiving inhalers this admission.  He reported having onset of flushing sensation and lightheadedness/dizziness starting 12/23/2020 which persisted all morning.  He was concerned that symptoms were very similar to his NSTEMI presentation last year prompting ED evaluation.  Prior to 12/23/2020 he has not had any issues with chest pain.  He has limited mobility due to his PAD with TMA of the right foot.  He states he can ambulate from his car to the grocery store and around his house without chest pain.  He is following with wound care outpatient for management of his right foot wound.  He denies left lower extremity edema, orthopnea, PND.  He has been compliant with his medications.  Past Medical History:  Diagnosis Date   Abdominal aortic aneurysm    no AAA on  [place] in 2020   CAD (coronary artery disease)    Stent to RCA 2021   Chronic obstructive pulmonary disease    HLD (hyperlipidemia)    HTN (hypertension)    Peripheral artery disease    s/p R fem pop // s/p R transmet amp    Past Surgical History:  Procedure Laterality Date   ABDOMINAL AORTOGRAM N/A  08/17/2017   Procedure: ABDOMINAL AORTOGRAM;  Surgeon: Waynetta Sandy, MD;  Location: [place] [facility];  Service: Cardiovascular;  Laterality: N/A;   AMPUTATION Right 08/21/2017   Procedure: TRANSMETATARSAL AMPUTATION RIGHT FOOT;  Surgeon: Newt Minion, MD;  Location: [place];  Service: Orthopedics;  Laterality: Right;   AMPUTATION TOE Right 08/18/2017   Procedure: AMPUTATION RIGHT SECOND AND THIRD TOES;  Surgeon: Waynetta Sandy, MD;  Location: [place];  Service: Vascular;  Laterality: Right;   CORONARY BALLOON ANGIOPLASTY N/A 12/03/2019   Procedure: CORONARY BALLOON ANGIOPLASTY;  Surgeon: Jettie Booze, MD;  Location: [place] [facility];  Service: Cardiovascular;  Laterality: N/A;   CORONARY STENT INTERVENTION N/A 12/03/2019   Procedure: CORONARY STENT INTERVENTION;  Surgeon: Jettie Booze, MD;  Location: [place] [facility];  Service: Cardiovascular;  Laterality: N/A;   ENDARTERECTOMY FEMORAL Right 08/18/2017   Procedure: ENDARTERECTOMY RIGHT SUPERFICIAL Jerene Canny;  Surgeon: Waynetta Sandy, MD;  Location: [place];  Service: Vascular;  Laterality: Right;   FEMORAL-POPLITEAL BYPASS GRAFT Right 08/18/2017   Procedure: BYPASS GRAFT RIGHT COMMON FEMORAL TO ABOVE KNEE POPLITEAL ARTERY USING RIGHT REVERSED GREAT SAPHENOUS VEIN;  Surgeon: Waynetta Sandy, MD;  Location: [place];  Service: Vascular;  Laterality: Right;   LEFT HEART CATH AND CORONARY ANGIOGRAPHY N/A 12/03/2019   Procedure: LEFT HEART CATH AND CORONARY ANGIOGRAPHY;  Surgeon: Jettie Booze, MD;  Location: [place] [facility];  Service: Cardiovascular;  Laterality: N/A;   LOWER EXTREMITY ANGIOGRAPHY Right 08/17/2017   Procedure: Lower Extremity Angiography;  Surgeon: Waynetta Sandy, MD;  Location: [place] [facility];  Service: Cardiovascular;  Laterality: Right;   PERIPHERAL VASCULAR BALLOON ANGIOPLASTY Right 08/17/2017   Procedure: PERIPHERAL VASCULAR BALLOON ANGIOPLASTY;  Surgeon:  Waynetta Sandy, MD;  Location: [place] [facility];  Service: Cardiovascular;  Laterality: Right;  SFA UNABLE TO CROSS   VEIN HARVEST Right 08/18/2017   Procedure: VEIN HARVEST RIGHT GREAT SAPHENOUS;  Surgeon: Waynetta Sandy, MD;  Location: [place];  Service: Vascular;  Laterality: Right;   WOUND DEBRIDEMENT Right 08/18/2017   Procedure: DEBRIDEMENT WOUND RIGHT FOOT;  Surgeon: Waynetta Sandy, MD;  Location: [place];  Service: Vascular;  Laterality: Right;     Home Medications:  Prior to Admission medications   Medication Sig Start Date End Date Taking? Authorizing Provider  Albuterol Sulfate 108 (90 Base) MCG/ACT AEPB Inhale 90 mcg into the lungs.   Yes [provider]  aspirin 81 MG chewable tablet Chew 1 tablet (81 mg total) by mouth daily. 12/04/19  Yes Kathyrn Drown D, NP  BREZTRI AEROSPHERE 160-9-4.8 MCG/ACT AERO Inhale 2 puffs into the lungs 2 (two) times daily. 07/09/19  Yes [provider]  clopidogrel (PLAVIX) 75 MG tablet Take 1 tablet (75 mg total) by mouth daily. 09/29/20  Yes Josue Hector, MD  losartan (COZAAR) 50 MG tablet Take 1 tablet by mouth once daily 12/08/20  Yes Verta Ellen., NP  metoprolol tartrate (LOPRESSOR) 25 MG tablet Take 1 tablet by mouth twice daily 08/21/20  Yes Verta Ellen., NP  Multiple Vitamins-Minerals (CENTRUM SILVER 50+MEN PO) Take by mouth.   Yes [provider]  nitroGLYCERIN (NITROSTAT) 0.4 MG SL tablet Place 1 tablet (0.4 mg total) under the tongue every 5 (five) minutes as needed for chest pain. 12/13/19 03/18/21 Yes Verta Ellen., NP  rosuvastatin (CRESTOR) 40 MG tablet Take 1 tablet by mouth once daily 07/16/20  Yes Kathyrn Drown D, NP    Inpatient Medications: Scheduled Meds:  aspirin  81 mg Oral Daily   clopidogrel  75 mg Oral Daily   enoxaparin (LOVENOX) injection  40 mg Subcutaneous Q24H   fluticasone furoate-vilanterol  1 puff Inhalation Daily   losartan  50 mg Oral Daily    metoprolol tartrate  25 mg Oral BID   rosuvastatin  40 mg Oral Daily   umeclidinium bromide  1 puff Inhalation Daily   Continuous Infusions:  vancomycin 1,000 mg (12/25/20 1306)   PRN Meds: acetaminophen, nitroGLYCERIN, ondansetron (ZOFRAN) IV  Allergies:    Allergies  Allergen Reactions   Penicillins Hives and Rash    Childhood reaction Has patient had a PCN reaction causing immediate rash, facial/tongue/throat swelling, SOB or lightheadedness with hypotension: No PT DEVELOPED ## SEVERE ## RASH INVOLVING MUCUS MEMBRANES or SKIN NECROSIS: #  #  YES  #  # Has patient had a PCN reaction that required hospitalization: Unknown Has patient had a PCN reaction occurring within the last 10 years: No   Clindamycin Rash    Diffuse rash across body    Social History:   Social History   Socioeconomic History   Marital status: Divorced    Spouse name: Not on file   Number of children: Not on file   Years of education: Not on file   Highest education level: Not on file  Occupational History   Not on file  Tobacco Use   Smoking status: Former   Smokeless tobacco: Never  Vaping Use   Vaping Use: Former  Substance and Sexual Activity   Alcohol use: Not Currently   Drug use: No   Sexual activity: Not on file  Other Topics Concern   Not on file  Social History Narrative   Not on file   Social Determinants of Health   Financial Resource Strain: Not on file  Food Insecurity: Not on file  Transportation Needs: Not on file  Physical Activity: Not on file  Stress: Not on file  Social Connections: Not on file  Intimate Partner Violence: Not on file    Family History:    Family History  Adopted: Yes     ROS:  Please see the history of present illness.   All other ROS reviewed and negative.     Physical Exam/Data:   Vitals:   12/24/20 1837 12/24/20 2311 12/25/20 0421 12/25/20 1149  BP: (!) 125/48 (!) 118/55 (!) 134/53 129/61  Pulse: 69 60 64 62  Resp: 16 20 18 18    Temp: 98.3 F (36.8 C) 98.2 F (36.8 C) 97.9 F (36.6 C) 98.4 F (36.9 C)  TempSrc: Oral Oral Oral Oral  SpO2: 97% 97% 94% 97%  Weight:      Height:        Intake/Output Summary (Last 24 hours) at 12/25/2020 1511 Last data filed at 12/25/2020 0947 Gross per 24 hour  Intake 1040 ml  Output --  Net 1040 ml   Last 3 Weights 12/24/2020 09/29/2020 03/18/2020  Weight (lbs) 279 lb 279 lb 261 lb 9.6 oz  Weight (kg) 126.554 kg 126.554 kg  118.661 kg     Body mass index is 37.84 kg/m.  General:  chronically ill appearing gentleman in no acute distress HEENT: sclera anicteric Neck: no JVD Vascular: No carotid bruits; distal pulses 2+ bilaterally  Cardiac:  normal S1, S2; RRR; no murmurs, rubs, or gallops Lungs:  mild wheezing on exam; no rales or rhonchi Abd: soft, nontender, no hepatomegaly  Ext: no LLE edema; RLE with edema s/p leg wrapping Musculoskeletal: s/p right TMA, BUE and BLE strength normal and equal Skin: warm and dry  Neuro:  CNs 2-12 intact, no focal abnormalities noted Psych:  Normal affect   EKG:  The EKG was personally reviewed and demonstrates:  sinus rhythm with first-degree AV block, rate 64 bpm, nonspecific ST to T wave abnormalities, no significant change from previous Telemetry:  Telemetry was personally reviewed and demonstrates:  sinus rhythm  Relevant CV Studies: Echocardiogram 12/02/2019:   1. Left ventricular ejection fraction, by estimation, is 65 to 70%. The  left ventricle has normal function. The left ventricle has no regional  wall motion abnormalities. Left ventricular diastolic parameters were  normal.   2. Right ventricular systolic function is normal. The right ventricular  size is normal. There is normal pulmonary artery systolic pressure.   3. The mitral valve is normal in structure. Trivial mitral valve  regurgitation. No evidence of mitral stenosis.   4. The aortic valve is grossly normal. Aortic valve regurgitation is not  visualized. No  aortic stenosis is present.   5. The inferior vena cava is normal in size with greater than 50%  respiratory variability, suggesting right atrial pressure of 3 mmHg.    LHC 12/03/2019:   High bifurcation of the posterior descending artery and posterior lateral artery in the mid RCA. RCA lesion, right at this bifurcation, is 99% stenosed. A drug-eluting stent was successfully placed using a STENT RESOLUTE ONYX 2.5X30, postdilated with a 2.75 balloon, including final kissing balloon angioplasty. Post intervention, there is a 0% residual stenosis. RPAV lesion is 99% stenosed. Thrombectomy was performed. Final, kissing balloon angioplasty was performed using a BALLOON SAPPHIRE 2.25X15. Post intervention, there is a 40% residual stenosis. Prox LAD to Mid LAD lesion is 25% stenosed. Prox RCA lesion is 25% stenosed. The left ventricular systolic function is normal. LV end diastolic pressure is normal. The left ventricular ejection fraction is 55-65% by visual estimate. There is no aortic valve stenosis. Balloon angioplasty was performed using a BALLOON SAPPHIRE 2.25X15. A drug-eluting stent was successfully placed using a STENT RESOLUTE ONYX 2.5X30.   Continue aggressive secondary prevention.  He will need dual antiplatelet therapy for a year.  High-dose statin as well.  Laboratory Data:  High Sensitivity Troponin:   Recent Labs  Lab 12/23/20 1520 12/23/20 1842 12/23/20 2003 12/24/20 0326  TROPONINIHS 6 6 6 7      Chemistry Recent Labs  Lab 12/23/20 1520 12/25/20 0559  NA 136 134*  K 3.8 3.9  CL 101 102  CO2 24 26  GLUCOSE 125* 111*  BUN 15 19  CREATININE 0.99 1.06  CALCIUM 9.6 8.8*  GFRNONAA >60 >60  ANIONGAP 11 6    No results for input(s): PROT, ALBUMIN, AST, ALT, ALKPHOS, BILITOT in the last 168 hours. Hematology Recent Labs  Lab 12/23/20 1520 12/25/20 0559  WBC 8.2 6.8  RBC 4.83 4.32  HGB 13.3 12.4*  HCT 42.5 38.1*  MCV 88.0 88.2  MCH 27.5 28.7  MCHC 31.3 32.5   RDW 15.5 15.6*  PLT 129* 118*  BNPNo results for input(s): BNP, PROBNP in the last 168 hours.  DDimer No results for input(s): DDIMER in the last 168 hours.   Radiology/Studies:  DG Chest 2 View  Result Date: 12/23/2020 CLINICAL DATA:  Chest pain. EXAM: CHEST - 2 VIEW COMPARISON:  Chest x-ray 12/06/2019. FINDINGS: There is central peribronchial wall thickening, unchanged. There is some minimal strandy lingular opacities. The lungs are otherwise clear. There is no pleural effusion or pneumothorax identified. The cardiomediastinal silhouette is within normal limits. Degenerative changes affect the spine. No acute fractures are identified. IMPRESSION: 1. Strandy opacities in the lingula may represent atelectasis or infection. 2. Mild bronchitic changes. Electronically Signed   By: Ronney Asters M.D.   On: 12/23/2020 15:58   DG Foot Complete Right  Result Date: 12/24/2020 CLINICAL DATA:  Wound cellulitis. EXAM: RIGHT FOOT COMPLETE - 3+ VIEW COMPARISON:  Radiographs 06/16/2018. FINDINGS: Status post remote transmetatarsal amputation through the bases of all the metatarsals. Possible interval slight extension of the 1st metatarsal amputation. No evidence of acute bone destruction, acute fracture or dislocation. Mild prominence of the soft tissues appear similar to the previous study. No foreign body or definite soft tissue emphysema. IMPRESSION: Postsurgical changes from previous transmetatarsal amputation. No evidence of osteomyelitis or foreign body. Electronically Signed   By: Richardean Sale M.D.   On: 12/24/2020 14:07   VAS [place] ABI WITH/WO TBI  Result Date: 12/25/2020  LOWER EXTREMITY DOPPLER STUDY Patient Name:  MAKSYMILIAN MABEY  Date of Exam:   12/25/2020 Medical Rec #: [id number]       Accession #:    [id number] Date of Birth: [birth year]        Patient Gender: M Patient Age:   53 years Exam Location:  [facility] Procedure:      VAS [place] ABI WITH/WO TBI Referring Phys: Monica Martinez  --------------------------------------------------------------------------------  Indications: Ulceration, and peripheral artery disease. High Risk Factors: Hypertension, hyperlipidemia, past history of smoking,                    coronary artery disease. Other Factors: COPD.  Vascular Interventions: Right femoral to above knee bypass 08/18/2017, right                         transmetatarsal amputation 08/21/2017. Comparison Study: Prior ABI done 07/30/19 Performing Technologist: Sharion Dove RVS  Examination Guidelines: A complete evaluation includes at minimum, Doppler waveform signals and systolic blood pressure reading at the level of bilateral brachial, anterior tibial, and posterior tibial arteries, when vessel segments are accessible. Bilateral testing is considered an integral part of a complete examination. Photoelectric Plethysmograph (PPG) waveforms and toe systolic pressure readings are included as required and additional duplex testing as needed. Limited examinations for reoccurring indications may be performed as noted.  ABI Findings: +---------+------------------+-----+----------+----------+ Right    Rt Pressure (mmHg)IndexWaveform  Comment    +---------+------------------+-----+----------+----------+ Brachial 158                                         +---------+------------------+-----+----------+----------+ PTA      129               0.78 monophasic           +---------+------------------+-----+----------+----------+ DP       138               0.83 monophasic           +---------+------------------+-----+----------+----------+  Great Toe                                 amputation +---------+------------------+-----+----------+----------+ +---------+------------------+-----+----------+-------+ Left     Lt Pressure (mmHg)IndexWaveform  Comment +---------+------------------+-----+----------+-------+ Brachial 166                                       +---------+------------------+-----+----------+-------+ PTA      89                0.54 monophasic        +---------+------------------+-----+----------+-------+ DP       101               0.61 monophasic        +---------+------------------+-----+----------+-------+ Great Toe84                0.51                   +---------+------------------+-----+----------+-------+ +-------+-----------+-----------+------------+------------+ ABI/TBIToday's ABIToday's TBIPrevious ABIPrevious TBI +-------+-----------+-----------+------------+------------+ Right  0.83       amputation                          +-------+-----------+-----------+------------+------------+ Left   0.61       0.51                                +-------+-----------+-----------+------------+------------+  Right ABIs appear increased compared to prior study on 07/30/19. Left ABIs appear essentially unchanged compared to prior study on 07/30/19. Left TBI appears increased since study done 07/30/19.  Summary: Right: Resting right ankle-brachial index indicates mild right lower extremity arterial disease. Left: Resting left ankle-brachial index indicates moderate left lower extremity arterial disease. The left toe-brachial index is abnormal.  *See table(s) above for measurements and observations.     Preliminary    VAS [place] LOWER EXTREMITY ARTERIAL DUPLEX  Result Date: 12/25/2020 LOWER EXTREMITY ARTERIAL DUPLEX STUDY Patient Name:  KODEE RAVERT  Date of Exam:   12/25/2020 Medical Rec #: [id number]       Accession #:    [id number] Date of Birth: January 06, 1946        Patient Gender: M Patient Age:   64 years [facility]am [facility] Procedure:      VAS [place] LOWER EXTREMITY ARTERIAL DUPLEX Referring Phys: Monica Martinez --------------------------------------------------------------------------------  Indications: Ulceration, and peripheral artery disease. High Risk Factors: Hypertension, hyperlipidemia, past history of  smoking,                    coronary artery disease.  Vascular Interventions: Right femoral to above knee bypass 08/18/2017,                         right transmetatarsal amputation 08/21/2017. Current ABI:            R:0.83 L:0.61 Comparison Study: Prior LEA done 07/30/19 Performing Technologist: Sharion Dove RVS  Examination Guidelines: A complete evaluation includes B-mode imaging, spectral Doppler, color Doppler, and power Doppler as needed of all accessible portions of each vessel. Bilateral testing is considered an integral part of a complete examination. Limited examinations for reoccurring indications may be performed as noted.  +----------+--------+-----+---------------+----------+--------+ RIGHT     PSV cm/sRatioStenosis  Waveform  Comments +----------+--------+-----+---------------+----------+--------+ CFA Prox  359          50-74% stenosis                   +----------+--------+-----+---------------+----------+--------+ POP Prox  140                         monophasic         +----------+--------+-----+---------------+----------+--------+ POP Mid   117                         monophasic         +----------+--------+-----+---------------+----------+--------+ POP Distal108                         monophasic         +----------+--------+-----+---------------+----------+--------+ ATA Prox  85                          monophasic         +----------+--------+-----+---------------+----------+--------+ ATA Mid   87                          monophasic         +----------+--------+-----+---------------+----------+--------+ ATA Distal100                         monophasic         +----------+--------+-----+---------------+----------+--------+ PTA Prox  85                          monophasic         +----------+--------+-----+---------------+----------+--------+ PTA Mid   58                          monophasic          +----------+--------+-----+---------------+----------+--------+  Right Graft #1: +------------------+--------+---------------+----------+--------+                   PSV cm/sStenosis       Waveform  Comments +------------------+--------+---------------+----------+--------+ Inflow            359     50-74% stenosis                   +------------------+--------+---------------+----------+--------+ Prox Anastomosis  79                     monophasic         +------------------+--------+---------------+----------+--------+ Proximal Graft    64                     monophasic         +------------------+--------+---------------+----------+--------+ Mid Graft         71                     monophasic         +------------------+--------+---------------+----------+--------+ Distal Graft      67                     monophasic         +------------------+--------+---------------+----------+--------+ Distal Anastomosis89                     monophasic         +------------------+--------+---------------+----------+--------+  Outflow           186                    monophasic         +------------------+--------+---------------+----------+--------+   Summary: Right: 50-74% stenosis noted in the common femoral artery. Patent femoral to above knee bypass. 50-74% CFA stenosis.  See table(s) above for measurements and observations.    Preliminary      Assessment and Plan:   1.  Lightheadedness and chest pain in patient with CAD s/p PCI/DES to RCA 11/2019: Patient presented with lightheadedness and mild chest pain reminiscent of presentation of NSTEMI 11/2019.  EKG shows sinus rhythm with chronic nonspecific ST-T wave abnormalities; overall unchanged from previous. HsTrop negative x4.  He otherwise had mild nonobstructive CAD following PCI/DES of RCA on [facility] 11/2019.  Echo 11/2019 with normal LV function and no significant valvular abnormalities. He is currently chest pain free.  -  Will plan for an outpatient NST to r/o ischemia - Shared Decision Making/Informed Consent{ The risks [chest pain, shortness of breath, cardiac arrhythmias, dizziness, blood pressure fluctuations, myocardial infarction, stroke/transient ischemic attack, nausea, vomiting, allergic reaction, radiation exposure, metallic taste sensation and life-threatening complications (estimated to be 1 in 10,000)], benefits (risk stratification, diagnosing coronary artery disease, treatment guidance) and alternatives of a nuclear stress test were discussed in detail with Mr. Rademaker and he agrees to proceed. - Continue aspirin and plavix - Continue Bblocker - Continue statin  2. PAD s/p right fem-pop and TMA: prelim on ABI/LEAs this admission with patent fem-pop and ongoing mild-moderate PAD. He follows with Dr. Sharol Given outpatient for poorly healing right foot wound. XR negative for ostemyelitis this admission. Leg wrapped today.  - Continue management per primary team, vascular surgery, and ortho  3. HTN: BP overall stable - Continue metoprolol and losartan  4. HLD: LDL 34 this admission - Continue crestor  5. DM type 2: A1C 6.4 11/2019 - Continue management per primary team  6. COPD: has chronic SOB. Some wheezing on exam today - Continue management per primary team    Risk Assessment/Risk Scores:      N/A      For questions or updates, please contact [facility] Please consult [email or web address] for contact info under    Signed, Abigail Butts, PA-C  12/25/2020 3:11 PM As above, patient seen and examined.  Briefly he is a 75 year old male with past medical history of coronary artery disease, peripheral vascular disease, hypertension, hyperlipidemia, COPD for evaluation of chest and left upper extremity discomfort.  Patient has had previous non-ST elevation myocardial infarction with PCI of his right coronary artery.  LV function has been normal previously.  He typically does not have exertional  chest pain.  He has some dyspnea on exertion which she attributes to COPD.  No orthopnea, PND or syncope.  On Monday he developed dizziness, flushing sensation in his head, pain behind his eyes and some tingling in his left upper extremity/mild chest pressure.  He felt this may be similar to his presentation at time of infarct.  He therefore came to the emergency room and cardiology asked to evaluate.  Note his symptoms were 12 to 24 hours in duration.  He is being treated for lower extremity foot infection as well. BUN 19 and creatinine 1.06, hemoglobin 12.4 and all troponins normal.  Electrocardiogram shows sinus rhythm, first-degree AV block and prior septal infarct cannot be excluded.  1 chest/left upper extremity pain-symptoms are extremely atypical.  His symptoms lasted for 12 to 24 hours and all troponins are normal.  No diagnostic electrocardiographic changes.  Patient can be discharged from a cardiac standpoint.  We will arrange outpatient stress nuclear study for risk stratification and then follow-up in the office.  2 coronary artery disease-continue aspirin, Plavix and statin.  3 hypertension-patient's blood pressure is controlled.  Continue present medications and follow.  4 hyperlipidemia-continue statin.  5 peripheral vascular disease-Per vascular surgery.  Cardiology will sign off.  Please call with questions.  Kirk Ruths, MD

## 2020-12-25 NOTE — Progress Notes (Signed)
VASCULAR LAB    ABIs have been performed.  See CV proc for preliminary results.   Marieke Lubke, RVT 12/25/2020, 11:18 AM

## 2020-12-25 NOTE — Progress Notes (Signed)
[facility]  Subjective  -states he wants to go home today.   Objective 129/61 62 98.4 F (36.9 C) (Oral) 18 97%  Intake/Output Summary (Last 24 hours) at 12/25/2020 1438 Last data filed at 12/25/2020 0947 Gross per 24 hour  Intake 1040 ml  Output --  Net 1040 ml    Right DP palpable Right lower extremity pictured below with callus formation and wound     Laboratory Lab Results: Recent Labs    12/23/20 1520 12/25/20 0559  WBC 8.2 6.8  HGB 13.3 12.4*  HCT 42.5 38.1*  PLT 129* 118*   BMET Recent Labs    12/23/20 1520 12/25/20 0559  NA 136 134*  K 3.8 3.9  CL 101 102  CO2 24 26  GLUCOSE 125* 111*  BUN 15 19  CREATININE 0.99 1.06  CALCIUM 9.6 8.8*    COAG Lab Results  Component Value Date   INR 1.13 08/17/2017   No results found for: PTT  Assessment/Planning:   75 year old male that vascular was consulted yesterday for right lower extremity tissue loss.  As documented he has already undergone a right common femoral to above-knee popliteal bypass with Dr. Randie Heinz in 2019.  He has a palpable right DP pulse on exam and should have adequate inflow.  His ABI 0.83 on the right and bypass remains patent with reasonable velocities.  He does have evidence of inflow stenosis in the common femoral but his velocities are actually decreased from last year.  Today studies suggest 359 versus 449 last year.  Again he has an easily palpable femoral pulse and pedal pulse.  I do not think there is any indication for additional intervention at this time.  I have discussed with Dr. Randie Heinz.  We will arrange short interval surveillance in the office with 67-month follow-up with ABIs and arterial duplex for surveillance of his bypass.  I updated the patient.  Cephus Shelling 12/25/2020 2:38 PM --

## 2020-12-25 NOTE — Discharge Summary (Signed)
Discharge Summary  Jorge Clements HCW:[id number] DOB: 1945-11-15  PCP: Celene Squibb, MD  Admit date: 12/23/2020 Discharge date: 12/25/2020  Time spent: 35 minutes.  Recommendations for Outpatient Follow-up:  Follow-up with cardiology Follow-up with orthopedic surgery Follow-up with vascular surgery Take your medications as prescribed.  Discharge Diagnoses:  Active Hospital Problems   Diagnosis Date Noted   Chest pain, rule out acute myocardial infarction 12/23/2020   Venous insufficiency (chronic) (peripheral)    Ulcer of right foot limited to breakdown of skin (HCC)    PAD (peripheral artery disease) ([facility]) 12/23/2020   HLD (hyperlipidemia) 12/23/2020   History of transmetatarsal amputation of right foot [facility])    Essential hypertension 08/11/2017    Resolved Hospital Problems  No resolved problems to display.    Discharge Condition: Stable  Diet recommendation: Resume previous diet.  Vitals:   12/25/20 0421 12/25/20 1149  BP: (!) 134/53 129/61  Pulse: 64 62  Resp: 18 18  Temp: 97.9 F (36.6 C) 98.4 F (36.9 C)  SpO2: 94% 97%    History of present illness:   Jorge Clements is a 75 y.o. male with medical history significant of PAD, HTN, HLD, CAD s/p stent to RCA for NSTEMI 1 year ago who presented with complaints of lightheadedness, left arm tingling numbness, symptoms do not similar to his previous NSTEMI last year.  Denies having any chest pain or dyspnea.  Troponin x2 negative.  He stated he has never had chest pain even when he was diagnosed with an NSTEMI last year.  On admission patient was noted to have full-thickness ulceration on plantar aspect of first metatarsal head status post transmetatarsal amputation in 2019 by Dr. Sharol Given.  Patient has also been seen in the past by vascular surgery Dr. Donzetta Matters for femoropopliteal bypass.  Patient was seen by wound care specialist with recommendation for orthopedic or vascular surgery involvement.  Seen by orthopedic surgery and  vascular surgery, will follow-up outpatient for his right foot wound and peripheral artery disease.  He was also seen by cardiology, ACS was ruled out.  Will follow-up with his cardiologist outpatient.  12/25/2020: Patient was seen and examined at his bedside.  No chest pain.  No pain in his right foot.  He wants to go home.  Hospital Course:  Principal Problem:   Chest pain, rule out acute myocardial infarction Active Problems:   Essential hypertension   History of transmetatarsal amputation of right foot (HCC)   PAD (peripheral artery disease) (HCC)   HLD (hyperlipidemia)   Venous insufficiency (chronic) (peripheral)   Ulcer of right foot limited to breakdown of skin (HCC)  Lightheadedness/left arm tingling numbness Self reports symptoms are similar to prior NSTEMI diagnosed last year Troponin negative x2 No evidence of acute ischemia on twelve-lead EKG Seen by cardiology.  No evidence of ACS. Follow-up with cardiology outpatient.   Right foot wound post transmetatarsal amputation in 2019 Lost to follow-up Right foot x-ray no evidence of osteomyelitis. ESR and CRP slightly elevated. Received IV vancomycin x2 days. Seen by orthopedic surgery, Dr. Sharol Given, will follow-up outpatient.   Peripheral artery disease Has been seen in the past by Dr. Donzetta Matters, vascular surgery, for femoropopliteal bypass Continue home aspirin, Plavix and Crestor. Seen by vascular surgery, will follow up with Dr. Donzetta Matters outpatient.   Hypertension BP is currently at goal Continue losartan, metoprolol      Consultants: Orthopedic surgery Vascular surgery Cardiology    Antimicrobials: IV vancomycin    Discharge Exam: BP 129/61 (BP Location: Right  Arm)   Pulse 62   Temp 98.4 F (36.9 C) (Oral)   Resp 18   Ht 6' (1.829 m)   Wt 126.6 kg   SpO2 97%   BMI 37.84 kg/m  General: 75 y.o. year-old male well developed well nourished in no acute distress.  Alert and oriented x3. Cardiovascular: Regular  rate and rhythm with no rubs or gallops.  No thyromegaly or JVD noted.   Respiratory: Clear to auscultation with no wheezes or rales. Good inspiratory effort. Abdomen: Soft nontender nondistended with normal bowel sounds x4 quadrants. Psychiatry: Mood is appropriate for condition and setting  Discharge Instructions You were cared for by a hospitalist during your hospital stay. If you have any questions about your discharge medications or the care you received while you were in the hospital after you are discharged, you can call the unit and asked to speak with the hospitalist on call if the hospitalist that took care of you is not available. Once you are discharged, your primary care physician will handle any further medical issues. Please note that NO REFILLS for any discharge medications will be authorized once you are discharged, as it is imperative that you return to your primary care physician (or establish a relationship with a primary care physician if you do not have one) for your aftercare needs so that they can reassess your need for medications and monitor your lab values.   Allergies as of 12/25/2020       Reactions   Penicillins Hives, Rash   Childhood reaction Has patient had a PCN reaction causing immediate rash, facial/tongue/throat swelling, SOB or lightheadedness with hypotension: No PT DEVELOPED ## SEVERE ## RASH INVOLVING MUCUS MEMBRANES or SKIN NECROSIS: #  #  YES  #  # Has patient had a PCN reaction that required hospitalization: Unknown Has patient had a PCN reaction occurring within the last 10 years: No   Clindamycin Rash   Diffuse rash across body        Medication List     TAKE these medications    Albuterol Sulfate 108 (90 Base) MCG/ACT Aepb Commonly known as: PROAIR RESPICLICK Inhale 90 mcg into the lungs.   aspirin 81 MG chewable tablet Chew 1 tablet (81 mg total) by mouth daily.   Breztri Aerosphere 160-9-4.8 MCG/ACT Aero Generic drug:  Budeson-Glycopyrrol-Formoterol Inhale 2 puffs into the lungs 2 (two) times daily.   CENTRUM SILVER 50+MEN PO Take by mouth.   clopidogrel 75 MG tablet Commonly known as: PLAVIX Take 1 tablet (75 mg total) by mouth daily.   losartan 50 MG tablet Commonly known as: COZAAR Take 1 tablet by mouth once daily   metoprolol tartrate 25 MG tablet Commonly known as: LOPRESSOR Take 1 tablet by mouth twice daily   nitroGLYCERIN 0.4 MG SL tablet Commonly known as: NITROSTAT Place 1 tablet (0.4 mg total) under the tongue every 5 (five) minutes as needed for chest pain.   rosuvastatin 40 MG tablet Commonly known as: CRESTOR Take 1 tablet by mouth once daily       Allergies  Allergen Reactions   Penicillins Hives and Rash    Childhood reaction Has patient had a PCN reaction causing immediate rash, facial/tongue/throat swelling, SOB or lightheadedness with hypotension: No PT DEVELOPED ## SEVERE ## RASH INVOLVING MUCUS MEMBRANES or SKIN NECROSIS: #  #  YES  #  # Has patient had a PCN reaction that required hospitalization: Unknown Has patient had a PCN reaction occurring within the last 10 years:  No   Clindamycin Rash    Diffuse rash across body    Follow-up Information     Newt Minion, MD Follow up in 1 week(s).   Specialty: Orthopedic Surgery Contact information: [place] [address] [place] Follow up.   Specialty: Cardiology Why: Our office will contact you to arrange an outpatient stress test and follow-up with cardiology thereafter. Contact information: [place] [phone]        Waynetta Sandy, MD. Call today.   Specialties: Vascular Surgery, Cardiology Why: Please call for a posthospital follow-up appointment. Contact information: [place] [address] [phone]         Celene Squibb, MD. Call today.    Specialty: Internal Medicine Why: Please call for a post hospital follow-up appointment. Contact information: [address] [place] [facility] [address] [phone]         Josue Hector, MD .   Specialty: Cardiology Contact information: [phone] [place] [address] [phone]                  The results of significant diagnostics from this hospitalization (including imaging, microbiology, ancillary and laboratory) are listed below for reference.    Significant Diagnostic Studies: DG Chest 2 View  Result Date: 12/23/2020 CLINICAL DATA:  Chest pain. EXAM: CHEST - 2 VIEW COMPARISON:  Chest x-ray 12/06/2019. FINDINGS: There is central peribronchial wall thickening, unchanged. There is some minimal strandy lingular opacities. The lungs are otherwise clear. There is no pleural effusion or pneumothorax identified. The cardiomediastinal silhouette is within normal limits. Degenerative changes affect the spine. No acute fractures are identified. IMPRESSION: 1. Strandy opacities in the lingula may represent atelectasis or infection. 2. Mild bronchitic changes. Electronically Signed   By: Ronney Asters M.D.   On: 12/23/2020 15:58   DG Foot Complete Right  Result Date: 12/24/2020 CLINICAL DATA:  Wound cellulitis. EXAM: RIGHT FOOT COMPLETE - 3+ VIEW COMPARISON:  Radiographs 06/16/2018. FINDINGS: Status post remote transmetatarsal amputation through the bases of all the metatarsals. Possible interval slight extension of the 1st metatarsal amputation. No evidence of acute bone destruction, acute fracture or dislocation. Mild prominence of the soft tissues appear similar to the previous study. No foreign body or definite soft tissue emphysema. IMPRESSION: Postsurgical changes from previous transmetatarsal amputation. No evidence of osteomyelitis or foreign body. Electronically Signed   By: Richardean Sale M.D.   On: 12/24/2020 14:07   VAS [place] ABI WITH/WO  TBI  Result Date: 12/25/2020  LOWER EXTREMITY DOPPLER STUDY Patient Name:  Jorge Clements  Date of Exam:   12/25/2020 Medical Rec #: [id number]       Accession #:    [id number] Date of Birth: [birth year]        Patient Gender: M Patient Age:   18 years Exam Location:  [facility] Procedure:      VAS [place] ABI WITH/WO TBI Referring Phys: Monica Martinez --------------------------------------------------------------------------------  Indications: Ulceration, and peripheral artery disease. High Risk Factors: Hypertension, hyperlipidemia, past history of smoking,                    coronary artery disease. Other Factors: COPD.  Vascular Interventions: Right femoral to above knee bypass 08/18/2017, right  transmetatarsal amputation 08/21/2017. Comparison Study: Prior ABI done 07/30/19 Performing Technologist: Sharion Dove RVS  Examination Guidelines: A complete evaluation includes at minimum, Doppler waveform signals and systolic blood pressure reading at the level of bilateral brachial, anterior tibial, and posterior tibial arteries, when vessel segments are accessible. Bilateral testing is considered an integral part of a complete examination. Photoelectric Plethysmograph (PPG) waveforms and toe systolic pressure readings are included as required and additional duplex testing as needed. Limited examinations for reoccurring indications may be performed as noted.  ABI Findings: +---------+------------------+-----+----------+----------+ Right    Rt Pressure (mmHg)IndexWaveform  Comment    +---------+------------------+-----+----------+----------+ Brachial 158                                         +---------+------------------+-----+----------+----------+ PTA      129               0.78 monophasic           +---------+------------------+-----+----------+----------+ DP       138               0.83 monophasic            +---------+------------------+-----+----------+----------+ Great Toe                                 amputation +---------+------------------+-----+----------+----------+ +---------+------------------+-----+----------+-------+ Left     Lt Pressure (mmHg)IndexWaveform  Comment +---------+------------------+-----+----------+-------+ Brachial 166                                      +---------+------------------+-----+----------+-------+ PTA      89                0.54 monophasic        +---------+------------------+-----+----------+-------+ DP       101               0.61 monophasic        +---------+------------------+-----+----------+-------+ Great Toe84                0.51                   +---------+------------------+-----+----------+-------+ +-------+-----------+-----------+------------+------------+ ABI/TBIToday's ABIToday's TBIPrevious ABIPrevious TBI +-------+-----------+-----------+------------+------------+ Right  0.83       amputation                          +-------+-----------+-----------+------------+------------+ Left   0.61       0.51                                +-------+-----------+-----------+------------+------------+  Right ABIs appear increased compared to prior study on 07/30/19. Left ABIs appear essentially unchanged compared to prior study on 07/30/19. Left TBI appears increased since study done 07/30/19.  Summary: Right: Resting right ankle-brachial index indicates mild right lower extremity arterial disease. Left: Resting left ankle-brachial index indicates moderate left lower extremity arterial disease. The left toe-brachial index is abnormal.  *See table(s) above for measurements and observations.     Preliminary    VAS [place] LOWER EXTREMITY ARTERIAL DUPLEX  Result Date: 12/25/2020 LOWER EXTREMITY ARTERIAL DUPLEX STUDY Patient Name:  Jorge Clements  Date of Exam:   12/25/2020 Medical Rec #:  [id number]       Accession #:    [id number]  Date of Birth: [birth year]        Patient Gender: M Patient Age:   51 years Exam [facility]  [facility] Procedure:      VAS [place] LOWER EXTREMITY ARTERIAL DUPLEX Referring Phys: Monica Martinez --------------------------------------------------------------------------------  Indications: Ulceration, and peripheral artery disease. High Risk Factors: Hypertension, hyperlipidemia, past history of smoking,                    coronary artery disease.  Vascular Interventions: Right femoral to above knee bypass 08/18/2017,                         right transmetatarsal amputation 08/21/2017. Current ABI:            R:0.83 L:0.61 Comparison Study: Prior LEA done 07/30/19 Performing Technologist: Sharion Dove RVS  Examination Guidelines: A complete evaluation includes B-mode imaging, spectral Doppler, color Doppler, and power Doppler as needed of all accessible portions of each vessel. Bilateral testing is considered an integral part of a complete examination. Limited examinations for reoccurring indications may be performed as noted.  +----------+--------+-----+---------------+----------+--------+ RIGHT     PSV cm/sRatioStenosis       Waveform  Comments +----------+--------+-----+---------------+----------+--------+ CFA Prox  359          50-74% stenosis                   +----------+--------+-----+---------------+----------+--------+ POP Prox  140                         monophasic         +----------+--------+-----+---------------+----------+--------+ POP Mid   117                         monophasic         +----------+--------+-----+---------------+----------+--------+ POP Distal108                         monophasic         +----------+--------+-----+---------------+----------+--------+ ATA Prox  85                          monophasic         +----------+--------+-----+---------------+----------+--------+ ATA Mid   87                          monophasic          +----------+--------+-----+---------------+----------+--------+ ATA Distal100                         monophasic         +----------+--------+-----+---------------+----------+--------+ PTA Prox  85                          monophasic         +----------+--------+-----+---------------+----------+--------+ PTA Mid   58                          monophasic         +----------+--------+-----+---------------+----------+--------+  Right Graft #1: +------------------+--------+---------------+----------+--------+                   PSV cm/sStenosis       Waveform  Comments +------------------+--------+---------------+----------+--------+  Inflow            359     50-74% stenosis                   +------------------+--------+---------------+----------+--------+ Prox Anastomosis  79                     monophasic         +------------------+--------+---------------+----------+--------+ Proximal Graft    64                     monophasic         +------------------+--------+---------------+----------+--------+ Mid Graft         71                     monophasic         +------------------+--------+---------------+----------+--------+ Distal Graft      67                     monophasic         +------------------+--------+---------------+----------+--------+ Distal Anastomosis89                     monophasic         +------------------+--------+---------------+----------+--------+ Outflow           186                    monophasic         +------------------+--------+---------------+----------+--------+   Summary: Right: 50-74% stenosis noted in the common femoral artery. Patent femoral to above knee bypass. 50-74% CFA stenosis.  See table(s) above for measurements and observations.    Preliminary     Microbiology: Recent Results (from the past 240 hour(s))  Resp Panel by RT-PCR (Flu A&B, Covid) Nasopharyngeal Swab     Status: None   Collection Time:  12/23/20  7:15 PM   Specimen: Nasopharyngeal Swab; Nasopharyngeal(NP) swabs in vial transport medium  Result Value Ref Range Status   SARS Coronavirus 2 by RT PCR NEGATIVE NEGATIVE Final    Comment: (NOTE) SARS-CoV-2 target nucleic acids are NOT DETECTED.  The SARS-CoV-2 RNA is generally detectable in upper respiratory specimens during the acute phase of infection. The lowest concentration of SARS-CoV-2 viral copies this assay can detect is 138 copies/mL. A negative result does not preclude SARS-Cov-2 infection and should not be used as the sole basis for treatment or other patient management decisions. A negative result may occur with  improper specimen collection/handling, submission of specimen other than nasopharyngeal swab, presence of viral mutation(s) within the areas targeted by this assay, and inadequate number of viral copies(<138 copies/mL). A negative result must be combined with clinical observations, patient history, and epidemiological information. The expected result is Negative.  Fact Sheet for Patients:  EntrepreneurPulse.com.au  Fact Sheet for Healthcare Providers:  IncredibleEmployment.be  This test is no t yet approved or cleared by the [place] FDA and  has been authorized for detection and/or diagnosis of SARS-CoV-2 by FDA under an Emergency Use Authorization (EUA). This EUA will remain  in effect (meaning this test can be used) for the duration of the COVID-19 declaration under Section 564(b)(1) of the Act, 21 U.S.C.section 360bbb-3(b)(1), unless the authorization is terminated  or revoked sooner.       Influenza A by PCR NEGATIVE NEGATIVE Final   Influenza B by PCR NEGATIVE NEGATIVE Final    Comment: (NOTE) The Xpert Xpress SARS-CoV-2/FLU/RSV plus assay is  intended as an aid in the diagnosis of influenza from Nasopharyngeal swab specimens and should not be used as a sole basis for treatment. Nasal washings  and aspirates are unacceptable for Xpert Xpress SARS-CoV-2/FLU/RSV testing.  Fact Sheet for Patients: EntrepreneurPulse.com.au  Fact Sheet for Healthcare Providers: IncredibleEmployment.be  This test is not yet approved or cleared by the [place] FDA and has been authorized for detection and/or diagnosis of SARS-CoV-2 by FDA under an Emergency Use Authorization (EUA). This EUA will remain in effect (meaning this test can be used) for the duration of the COVID-19 declaration under Section 564(b)(1) of the Act, 21 U.S.C. section 360bbb-3(b)(1), unless the authorization is terminated or revoked.  Performed at [facility], [place] [address], [place], [place] [address]   Aerobic Culture w Gram Stain (superficial specimen)     Status: None (Preliminary result)   Collection Time: 12/24/20  1:21 AM   Specimen: Foot  Result Value Ref Range Status   Specimen Description FOOT  Final   Special Requests NONE  Final   Gram Stain NO WBC SEEN NO ORGANISMS SEEN   Final   Culture   Final    FEW CULTURE REINCUBATED FOR BETTER GROWTH Performed at [facility], [address] [place], [place] [address]    Report Status PENDING  Incomplete  MRSA Next Gen by PCR, Nasal     Status: None   Collection Time: 12/24/20 12:17 PM   Specimen: Nasal Mucosa; Nasal Swab  Result Value Ref Range Status   MRSA by PCR Next Gen NOT DETECTED NOT DETECTED Final    Comment: (NOTE) The GeneXpert MRSA Assay (FDA approved for NASAL specimens only), is one component of a comprehensive MRSA colonization surveillance program. It is not intended to diagnose MRSA infection nor to guide or monitor treatment for MRSA infections. Test performance is not FDA approved in patients less than 80 years old. Performed at [facility], [place] [address], [place], [place] [address]      Labs: Basic Metabolic Panel: Recent Labs  Lab 12/23/20 1520 12/25/20 0559  NA 136  134*  K 3.8 3.9  CL 101 102  CO2 24 26  GLUCOSE 125* 111*  BUN 15 19  CREATININE 0.99 1.06  CALCIUM 9.6 8.8*   Liver Function Tests: No results for input(s): AST, ALT, ALKPHOS, BILITOT, PROT, ALBUMIN in the last 168 hours. No results for input(s): LIPASE, AMYLASE in the last 168 hours. No results for input(s): AMMONIA in the last 168 hours. CBC: Recent Labs  Lab 12/23/20 1520 12/25/20 0559  WBC 8.2 6.8  HGB 13.3 12.4*  HCT 42.5 38.1*  MCV 88.0 88.2  PLT 129* 118*   Cardiac Enzymes: No results for input(s): CKTOTAL, CKMB, CKMBINDEX, TROPONINI in the last 168 hours. BNP: BNP (last 3 results) No results for input(s): BNP in the last 8760 hours.  ProBNP (last 3 results) No results for input(s): PROBNP in the last 8760 hours.  CBG: No results for input(s): GLUCAP in the last 168 hours.     Signed:  Kayleen Memos, MD [facility] 12/25/2020, 8:01 PM

## 2020-12-27 LAB — AEROBIC CULTURE W GRAM STAIN (SUPERFICIAL SPECIMEN): Gram Stain: NONE SEEN

## 2021-01-01 ENCOUNTER — Other Ambulatory Visit: Payer: Self-pay

## 2021-01-01 ENCOUNTER — Ambulatory Visit (INDEPENDENT_AMBULATORY_CARE_PROVIDER_SITE_OTHER): Payer: Medicare HMO | Admitting: Orthopedic Surgery

## 2021-01-01 DIAGNOSIS — L97511 Non-pressure chronic ulcer of other part of right foot limited to breakdown of skin: Secondary | ICD-10-CM | POA: Diagnosis not present

## 2021-01-05 ENCOUNTER — Encounter: Payer: Self-pay | Admitting: Orthopedic Surgery

## 2021-01-05 NOTE — Progress Notes (Signed)
Office Visit Note   Patient: Jorge Clements           Date of Birth: 11/29/1945           MRN: [id number] Visit Date: 01/01/2021              Requested by: Benita Stabile, MD [address] [place],  [place] [address] PCP: Benita Stabile, MD  Chief Complaint  Patient presents with   Right Leg - Wound Check      HPI: Patient is a 75 year old gentleman who is seen in follow-up from the emergency room.  Patient is status post a transmetatarsal amputation on the right.  Patient states that he was seeing a wound specialist in [place] complains of increased drainage odor and fever and chills.  Assessment & Plan: Visit Diagnoses:  1. Non-pressure chronic ulcer of other part of right foot limited to breakdown of skin (HCC)     Plan: We will apply a 3 layer compression wrap follow-up in 1 week.  Anticipate we can advance him to compression stockings.  Follow-Up Instructions: Return in about 1 week (around 01/08/2021).   Ortho Exam  Patient is alert, oriented, no adenopathy, well-dressed, normal affect, normal respiratory effort. Examination patient has massive dermatitis and swelling to the right lower extremity.  There is a plantar ulcer 2 cm in diameter 2 mm deep that has no tunneling does not probe to bone or tendon.  There is massive fungal dermatitis and a large amount of this callus tissue was debrided.  Patient does have significant venous swelling in the foot ankle and leg with brawny skin color changes.  Imaging: No results found. No images are attached to the encounter.  Labs: Lab Results  Component Value Date   HGBA1C 6.4 (H) 12/02/2019   ESRSEDRATE 40 (H) 12/24/2020   CRP 1.9 (H) 12/24/2020   REPTSTATUS 12/27/2020 FINAL 12/24/2020   GRAMSTAIN NO WBC SEEN NO ORGANISMS SEEN  12/24/2020   CULT  12/24/2020    FEW STAPHYLOCOCCUS AUREUS WITHIN MIXED ORGANISMS Performed at [facility], [address] [place], [place] [address]    [facility] STAPHYLOCOCCUS AUREUS  12/24/2020     Lab Results  Component Value Date   ALBUMIN 3.9 12/01/2019   ALBUMIN 3.7 09/26/2017   ALBUMIN 2.3 (L) 08/17/2017    Lab Results  Component Value Date   MG 2.2 08/17/2017   MG 1.8 08/15/2017   No results found for: VD25OH  No results found for: PREALBUMIN CBC EXTENDED Latest Ref Rng & Units 12/25/2020 12/23/2020 12/06/2019  WBC 4.0 - 10.5 K/uL 6.8 8.2 12.2(H)  RBC 4.22 - 5.81 MIL/uL 4.32 4.83 4.66  HGB 13.0 - 17.0 g/dL 12.4(L) 13.3 13.8  HCT 39.0 - 52.0 % 38.1(L) 42.5 41.5  PLT 150 - 400 K/uL 118(L) 129(L) 150  NEUTROABS 1.7 - 7.7 K/uL - - -  LYMPHSABS 0.7 - 4.0 K/uL - - -     There is no height or weight on file to calculate BMI.  Orders:  No orders of the defined types were placed in this encounter.  No orders of the defined types were placed in this encounter.    Procedures: No procedures performed  Clinical Data: No additional findings.  ROS:  All other systems negative, except as noted in the HPI. Review of Systems  Objective: Vital Signs: There were no vitals taken for this visit.  Specialty Comments:  No specialty comments available.  PMFS History: Patient Active Problem  List   Diagnosis Date Noted   Venous insufficiency (chronic) (peripheral)    Ulcer of right foot limited to breakdown of skin (HCC)    Chest pain, rule out acute myocardial infarction 12/23/2020   PAD (peripheral artery disease) (HCC) 12/23/2020   HLD (hyperlipidemia) 12/23/2020   ACS (acute coronary syndrome) (HCC) 12/01/2019   Non-ST elevation (NSTEMI) myocardial infarction (HCC) 12/01/2019   NSTEMI (non-ST elevated myocardial infarction) (HCC) 12/01/2019   History of transmetatarsal amputation of right foot (HCC)    Subacute osteomyelitis, right ankle and foot (HCC)    Infected blister of foot 08/13/2017   Pressure injury of skin 08/12/2017   Weakness 08/11/2017   COPD (chronic obstructive pulmonary disease) (HCC) 08/11/2017   Cellulitis of right lower  extremity 08/11/2017   Fall at home, initial encounter 08/11/2017   Thrombocytopenia (HCC) 08/11/2017   Weakness generalized 08/11/2017   Hypokalemia 08/11/2017   Essential hypertension 08/11/2017   Past Medical History:  Diagnosis Date   Abdominal aortic aneurysm    no AAA on [place] in 2020   CAD (coronary artery disease)    Stent to RCA 2021   Chronic obstructive pulmonary disease    HLD (hyperlipidemia)    HTN (hypertension)    Peripheral artery disease    s/p R fem pop // s/p R transmet amp    Family History  Adopted: Yes    Past Surgical History:  Procedure Laterality Date   ABDOMINAL AORTOGRAM N/A 08/17/2017   Procedure: ABDOMINAL AORTOGRAM;  Surgeon: Maeola Harman, MD;  Location: [facility] INVASIVE CV LAB;  Service: Cardiovascular;  Laterality: N/A;   AMPUTATION Right 08/21/2017   Procedure: TRANSMETATARSAL AMPUTATION RIGHT FOOT;  Surgeon: Nadara Mustard, MD;  Location: [facility] OR;  Service: Orthopedics;  Laterality: Right;   AMPUTATION TOE Right 08/18/2017   Procedure: AMPUTATION RIGHT SECOND AND THIRD TOES;  Surgeon: Maeola Harman, MD;  Location: [facility] OR;  Service: Vascular;  Laterality: Right;   CORONARY BALLOON ANGIOPLASTY N/A 12/03/2019   Procedure: CORONARY BALLOON ANGIOPLASTY;  Surgeon: Corky Crafts, MD;  Location: [facility] INVASIVE CV LAB;  Service: Cardiovascular;  Laterality: N/A;   CORONARY STENT INTERVENTION N/A 12/03/2019   Procedure: CORONARY STENT INTERVENTION;  Surgeon: Corky Crafts, MD;  Location: [facility] INVASIVE CV LAB;  Service: Cardiovascular;  Laterality: N/A;   ENDARTERECTOMY FEMORAL Right 08/18/2017   Procedure: ENDARTERECTOMY RIGHT SUPERFICIAL Alfredia Ferguson;  Surgeon: Maeola Harman, MD;  Location: [facility] OR;  Service: Vascular;  Laterality: Right;   FEMORAL-POPLITEAL BYPASS GRAFT Right 08/18/2017   Procedure: BYPASS GRAFT RIGHT COMMON FEMORAL TO ABOVE KNEE POPLITEAL ARTERY USING RIGHT REVERSED GREAT SAPHENOUS VEIN;  Surgeon: Maeola Harman, MD;  Location: [facility] OR;  Service: Vascular;  Laterality: Right;   LEFT HEART CATH AND CORONARY ANGIOGRAPHY N/A 12/03/2019   Procedure: LEFT HEART CATH AND CORONARY ANGIOGRAPHY;  Surgeon: Corky Crafts, MD;  Location: [facility] INVASIVE CV LAB;  Service: Cardiovascular;  Laterality: N/A;   LOWER EXTREMITY ANGIOGRAPHY Right 08/17/2017   Procedure: Lower Extremity Angiography;  Surgeon: Maeola Harman, MD;  Location: [facility] INVASIVE CV LAB;  Service: Cardiovascular;  Laterality: Right;   PERIPHERAL VASCULAR BALLOON ANGIOPLASTY Right 08/17/2017   Procedure: PERIPHERAL VASCULAR BALLOON ANGIOPLASTY;  Surgeon: Maeola Harman, MD;  Location: [facility] INVASIVE CV LAB;  Service: Cardiovascular;  Laterality: Right;  SFA UNABLE TO CROSS   VEIN HARVEST Right 08/18/2017   Procedure: VEIN HARVEST RIGHT GREAT SAPHENOUS;  Surgeon: Maeola Harman, MD;  Location: [facility] OR;  Service:  Vascular;  Laterality: Right;   WOUND DEBRIDEMENT Right 08/18/2017   Procedure: DEBRIDEMENT WOUND RIGHT FOOT;  Surgeon: Maeola Harman, MD;  Location: [facility] OR;  Service: Vascular;  Laterality: Right;   Social History   Occupational History   Not on file  Tobacco Use   Smoking status: Former   Smokeless tobacco: Never  Vaping Use   Vaping Use: Former  Substance and Sexual Activity   Alcohol use: Not Currently   Drug use: No   Sexual activity: Not on file

## 2021-01-06 ENCOUNTER — Encounter: Payer: Self-pay | Admitting: *Deleted

## 2021-01-06 ENCOUNTER — Telehealth: Payer: Self-pay | Admitting: Cardiovascular Disease

## 2021-01-06 NOTE — Telephone Encounter (Signed)
Patient is scheduled for upcoming stress test Unable to reach patient. Left message asking that he return call so that he can be given instructions on the test.

## 2021-01-08 ENCOUNTER — Encounter: Payer: Self-pay | Admitting: Orthopedic Surgery

## 2021-01-08 ENCOUNTER — Other Ambulatory Visit: Payer: Self-pay

## 2021-01-08 ENCOUNTER — Ambulatory Visit (INDEPENDENT_AMBULATORY_CARE_PROVIDER_SITE_OTHER): Payer: Medicare HMO | Admitting: Orthopedic Surgery

## 2021-01-08 DIAGNOSIS — L97511 Non-pressure chronic ulcer of other part of right foot limited to breakdown of skin: Secondary | ICD-10-CM | POA: Diagnosis not present

## 2021-01-13 ENCOUNTER — Encounter (HOSPITAL_COMMUNITY): Payer: Medicare HMO

## 2021-01-15 ENCOUNTER — Ambulatory Visit (INDEPENDENT_AMBULATORY_CARE_PROVIDER_SITE_OTHER): Payer: Medicare HMO | Admitting: Orthopedic Surgery

## 2021-01-15 ENCOUNTER — Encounter: Payer: Self-pay | Admitting: Orthopedic Surgery

## 2021-01-15 DIAGNOSIS — L97511 Non-pressure chronic ulcer of other part of right foot limited to breakdown of skin: Secondary | ICD-10-CM

## 2021-01-15 NOTE — Progress Notes (Signed)
Office Visit Note   Patient: Jorge Clements           Date of Birth: 05-Jul-1945           MRN: [id number] Visit Date: 01/15/2021              Requested by: Benita Stabile, MD [address] [place],  [place] [address] PCP: Benita Stabile, MD  Chief Complaint  Patient presents with   Right Leg - Pain      HPI: Patient is a 75 year old gentleman who presents in follow-up for massive venous insufficiency to the right leg with a large ulcer plantar aspect right foot status post transmetatarsal amputation.  Patient has been undergoing serial wraps with significant decreased swelling and decreased callused skin.  Assessment & Plan: Visit Diagnoses:  1. Non-pressure chronic ulcer of other part of right foot limited to breakdown of skin (HCC)     Plan: We will place 4 x 4's to the plantar wound and apply an Ace wrap.  Patient was given a prescription to obtain extra-large knee-high compression socks.  He will wear these around-the-clock wash his leg with soap and water daily changed to a new sock daily.  Follow-Up Instructions: Return in about 2 weeks (around 01/29/2021).   Ortho Exam  Patient is alert, oriented, no adenopathy, well-dressed, normal affect, normal respiratory effort. Examination patient still has brawny edema to the foot ankle and right leg however there is significant decreased swelling there is no cellulitis no odor no drainage the plantar ulcer is 3 cm in diameter with healthy granulation tissue with a depth of 2 mm no exposed bone or tendon.  The thick callus skin has slowly been debrided.  Imaging: No results found. No images are attached to the encounter.  Labs: Lab Results  Component Value Date   HGBA1C 6.4 (H) 12/02/2019   ESRSEDRATE 40 (H) 12/24/2020   CRP 1.9 (H) 12/24/2020   REPTSTATUS 12/27/2020 FINAL 12/24/2020   GRAMSTAIN NO WBC SEEN NO ORGANISMS SEEN  12/24/2020   CULT  12/24/2020    FEW STAPHYLOCOCCUS AUREUS WITHIN MIXED ORGANISMS Performed at  [facility], [address] [place], [place] [address]    [facility] STAPHYLOCOCCUS AUREUS 12/24/2020     Lab Results  Component Value Date   ALBUMIN 3.9 12/01/2019   ALBUMIN 3.7 09/26/2017   ALBUMIN 2.3 (L) 08/17/2017    Lab Results  Component Value Date   MG 2.2 08/17/2017   MG 1.8 08/15/2017   No results found for: VD25OH  No results found for: PREALBUMIN CBC EXTENDED Latest Ref Rng & Units 12/25/2020 12/23/2020 12/06/2019  WBC 4.0 - 10.5 K/uL 6.8 8.2 12.2(H)  RBC 4.22 - 5.81 MIL/uL 4.32 4.83 4.66  HGB 13.0 - 17.0 g/dL 12.4(L) 13.3 13.8  HCT 39.0 - 52.0 % 38.1(L) 42.5 41.5  PLT 150 - 400 K/uL 118(L) 129(L) 150  NEUTROABS 1.7 - 7.7 K/uL - - -  LYMPHSABS 0.7 - 4.0 K/uL - - -     There is no height or weight on file to calculate BMI.  Orders:  No orders of the defined types were placed in this encounter.  No orders of the defined types were placed in this encounter.    Procedures: No procedures performed  Clinical Data: No additional findings.  ROS:  All other systems negative, except as noted in the HPI. Review of Systems  Objective: Vital Signs: There were no vitals taken for this visit.  Specialty  Comments:  No specialty comments available.  PMFS History: Patient Active Problem List   Diagnosis Date Noted   Venous insufficiency (chronic) (peripheral)    Ulcer of right foot limited to breakdown of skin (HCC)    Chest pain, rule out acute myocardial infarction 12/23/2020   PAD (peripheral artery disease) (HCC) 12/23/2020   HLD (hyperlipidemia) 12/23/2020   ACS (acute coronary syndrome) (HCC) 12/01/2019   Non-ST elevation (NSTEMI) myocardial infarction (HCC) 12/01/2019   NSTEMI (non-ST elevated myocardial infarction) (HCC) 12/01/2019   History of transmetatarsal amputation of right foot (HCC)    Subacute osteomyelitis, right ankle and foot (HCC)    Infected blister of foot 08/13/2017   Pressure injury of skin 08/12/2017   Weakness 08/11/2017    COPD (chronic obstructive pulmonary disease) (HCC) 08/11/2017   Cellulitis of right lower extremity 08/11/2017   Fall at home, initial encounter 08/11/2017   Thrombocytopenia (HCC) 08/11/2017   Weakness generalized 08/11/2017   Hypokalemia 08/11/2017   Essential hypertension 08/11/2017   Past Medical History:  Diagnosis Date   Abdominal aortic aneurysm    no AAA on [place] in 2020   CAD (coronary artery disease)    Stent to RCA 2021   Chronic obstructive pulmonary disease    HLD (hyperlipidemia)    HTN (hypertension)    Peripheral artery disease    s/p R fem pop // s/p R transmet amp    Family History  Adopted: Yes    Past Surgical History:  Procedure Laterality Date   ABDOMINAL AORTOGRAM N/A 08/17/2017   Procedure: ABDOMINAL AORTOGRAM;  Surgeon: Maeola Harman, MD;  Location: [facility] INVASIVE CV LAB;  Service: Cardiovascular;  Laterality: N/A;   AMPUTATION Right 08/21/2017   Procedure: TRANSMETATARSAL AMPUTATION RIGHT FOOT;  Surgeon: Nadara Mustard, MD;  Location: [facility] OR;  Service: Orthopedics;  Laterality: Right;   AMPUTATION TOE Right 08/18/2017   Procedure: AMPUTATION RIGHT SECOND AND THIRD TOES;  Surgeon: Maeola Harman, MD;  Location: [facility] OR;  Service: Vascular;  Laterality: Right;   CORONARY BALLOON ANGIOPLASTY N/A 12/03/2019   Procedure: CORONARY BALLOON ANGIOPLASTY;  Surgeon: Corky Crafts, MD;  Location: [facility] INVASIVE CV LAB;  Service: Cardiovascular;  Laterality: N/A;   CORONARY STENT INTERVENTION N/A 12/03/2019   Procedure: CORONARY STENT INTERVENTION;  Surgeon: Corky Crafts, MD;  Location: [facility] INVASIVE CV LAB;  Service: Cardiovascular;  Laterality: N/A;   ENDARTERECTOMY FEMORAL Right 08/18/2017   Procedure: ENDARTERECTOMY RIGHT SUPERFICIAL Alfredia Ferguson;  Surgeon: Maeola Harman, MD;  Location: [facility] OR;  Service: Vascular;  Laterality: Right;   FEMORAL-POPLITEAL BYPASS GRAFT Right 08/18/2017   Procedure: BYPASS GRAFT RIGHT COMMON FEMORAL TO  ABOVE KNEE POPLITEAL ARTERY USING RIGHT REVERSED GREAT SAPHENOUS VEIN;  Surgeon: Maeola Harman, MD;  Location: [facility] OR;  Service: Vascular;  Laterality: Right;   LEFT HEART CATH AND CORONARY ANGIOGRAPHY N/A 12/03/2019   Procedure: LEFT HEART CATH AND CORONARY ANGIOGRAPHY;  Surgeon: Corky Crafts, MD;  Location: [facility] INVASIVE CV LAB;  Service: Cardiovascular;  Laterality: N/A;   LOWER EXTREMITY ANGIOGRAPHY Right 08/17/2017   Procedure: Lower Extremity Angiography;  Surgeon: Maeola Harman, MD;  Location: [facility] INVASIVE CV LAB;  Service: Cardiovascular;  Laterality: Right;   PERIPHERAL VASCULAR BALLOON ANGIOPLASTY Right 08/17/2017   Procedure: PERIPHERAL VASCULAR BALLOON ANGIOPLASTY;  Surgeon: Maeola Harman, MD;  Location: [facility] INVASIVE CV LAB;  Service: Cardiovascular;  Laterality: Right;  SFA UNABLE TO CROSS   VEIN HARVEST Right 08/18/2017   Procedure: VEIN HARVEST RIGHT GREAT SAPHENOUS;  Surgeon: Maeola Harman, MD;  Location: [facility] OR;  Service: Vascular;  Laterality: Right;   WOUND DEBRIDEMENT Right 08/18/2017   Procedure: DEBRIDEMENT WOUND RIGHT FOOT;  Surgeon: Maeola Harman, MD;  Location: [facility] OR;  Service: Vascular;  Laterality: Right;   Social History   Occupational History   Not on file  Tobacco Use   Smoking status: Former   Smokeless tobacco: Never  Vaping Use   Vaping Use: Former  Substance and Sexual Activity   Alcohol use: Not Currently   Drug use: No   Sexual activity: Not on file

## 2021-01-18 ENCOUNTER — Other Ambulatory Visit: Payer: Self-pay

## 2021-01-18 ENCOUNTER — Encounter (HOSPITAL_COMMUNITY): Payer: Self-pay | Admitting: Emergency Medicine

## 2021-01-18 ENCOUNTER — Emergency Department (HOSPITAL_COMMUNITY): Payer: Medicare HMO

## 2021-01-18 ENCOUNTER — Emergency Department (HOSPITAL_COMMUNITY)
Admission: EM | Admit: 2021-01-18 | Discharge: 2021-01-18 | Disposition: A | Discharge status: Home / Self Care | Payer: Medicare HMO | Attending: Emergency Medicine | Admitting: Emergency Medicine

## 2021-01-18 DIAGNOSIS — R600 Localized edema: Secondary | ICD-10-CM | POA: Diagnosis not present

## 2021-01-18 DIAGNOSIS — Z79899 Other long term (current) drug therapy: Secondary | ICD-10-CM | POA: Insufficient documentation

## 2021-01-18 DIAGNOSIS — L97519 Non-pressure chronic ulcer of other part of right foot with unspecified severity: Secondary | ICD-10-CM | POA: Diagnosis not present

## 2021-01-18 DIAGNOSIS — Z7902 Long term (current) use of antithrombotics/antiplatelets: Secondary | ICD-10-CM | POA: Diagnosis not present

## 2021-01-18 DIAGNOSIS — E041 Nontoxic single thyroid nodule: Secondary | ICD-10-CM | POA: Diagnosis not present

## 2021-01-18 DIAGNOSIS — J449 Chronic obstructive pulmonary disease, unspecified: Secondary | ICD-10-CM | POA: Diagnosis not present

## 2021-01-18 DIAGNOSIS — I10 Essential (primary) hypertension: Secondary | ICD-10-CM | POA: Diagnosis not present

## 2021-01-18 DIAGNOSIS — Z89431 Acquired absence of right foot: Secondary | ICD-10-CM | POA: Insufficient documentation

## 2021-01-18 DIAGNOSIS — Z9889 Other specified postprocedural states: Secondary | ICD-10-CM | POA: Diagnosis not present

## 2021-01-18 DIAGNOSIS — Z7982 Long term (current) use of aspirin: Secondary | ICD-10-CM | POA: Diagnosis not present

## 2021-01-18 DIAGNOSIS — S91301A Unspecified open wound, right foot, initial encounter: Secondary | ICD-10-CM | POA: Diagnosis not present

## 2021-01-18 DIAGNOSIS — R0602 Shortness of breath: Secondary | ICD-10-CM | POA: Diagnosis not present

## 2021-01-18 DIAGNOSIS — R509 Fever, unspecified: Secondary | ICD-10-CM | POA: Diagnosis not present

## 2021-01-18 DIAGNOSIS — Z87891 Personal history of nicotine dependence: Secondary | ICD-10-CM | POA: Insufficient documentation

## 2021-01-18 DIAGNOSIS — J189 Pneumonia, unspecified organism: Secondary | ICD-10-CM | POA: Diagnosis not present

## 2021-01-18 DIAGNOSIS — J9811 Atelectasis: Secondary | ICD-10-CM | POA: Diagnosis not present

## 2021-01-18 DIAGNOSIS — Z20822 Contact with and (suspected) exposure to covid-19: Secondary | ICD-10-CM | POA: Diagnosis not present

## 2021-01-18 DIAGNOSIS — I2510 Atherosclerotic heart disease of native coronary artery without angina pectoris: Secondary | ICD-10-CM | POA: Insufficient documentation

## 2021-01-18 LAB — CBC WITH DIFFERENTIAL/PLATELET
Abs Immature Granulocytes: 0.04 10*3/uL (ref 0.00–0.07)
Basophils Absolute: 0 10*3/uL (ref 0.0–0.1)
Basophils Relative: 0 %
Eosinophils Absolute: 0 10*3/uL (ref 0.0–0.5)
Eosinophils Relative: 0 %
HCT: 42.1 % (ref 39.0–52.0)
Hemoglobin: 13.7 g/dL (ref 13.0–17.0)
Immature Granulocytes: 0 %
Lymphocytes Relative: 4 %
Lymphs Abs: 0.4 10*3/uL — ABNORMAL LOW (ref 0.7–4.0)
MCH: 28.3 pg (ref 26.0–34.0)
MCHC: 32.5 g/dL (ref 30.0–36.0)
MCV: 87 fL (ref 80.0–100.0)
Monocytes Absolute: 0.8 10*3/uL (ref 0.1–1.0)
Monocytes Relative: 8 %
Neutro Abs: 8.5 10*3/uL — ABNORMAL HIGH (ref 1.7–7.7)
Neutrophils Relative %: 88 %
Platelets: 117 10*3/uL — ABNORMAL LOW (ref 150–400)
RBC: 4.84 MIL/uL (ref 4.22–5.81)
RDW: 15.8 % — ABNORMAL HIGH (ref 11.5–15.5)
WBC: 9.7 10*3/uL (ref 4.0–10.5)
nRBC: 0 % (ref 0.0–0.2)

## 2021-01-18 LAB — BASIC METABOLIC PANEL
Anion gap: 12 (ref 5–15)
BUN: 18 mg/dL (ref 8–23)
CO2: 24 mmol/L (ref 22–32)
Calcium: 9.6 mg/dL (ref 8.9–10.3)
Chloride: 95 mmol/L — ABNORMAL LOW (ref 98–111)
Creatinine, Ser: 1.17 mg/dL (ref 0.61–1.24)
GFR, Estimated: >60 mL/min (ref >60)
Glucose, Bld: 144 mg/dL — ABNORMAL HIGH (ref 70–99)
Potassium: 4.3 mmol/L (ref 3.5–5.1)
Sodium: 131 mmol/L — ABNORMAL LOW (ref 135–145)

## 2021-01-18 LAB — SEDIMENTATION RATE: Sed Rate: 28 mm/hr — ABNORMAL HIGH (ref 0–16)

## 2021-01-18 LAB — RESP PANEL BY RT-PCR (FLU A&B, COVID) ARPGX2
Influenza A by PCR: NEGATIVE
Influenza B by PCR: NEGATIVE
SARS Coronavirus 2 by RT PCR: NEGATIVE

## 2021-01-18 LAB — BRAIN NATRIURETIC PEPTIDE: B Natriuretic Peptide: 96.7 pg/mL (ref 0.0–100.0)

## 2021-01-18 LAB — C-REACTIVE PROTEIN: CRP: 11.1 mg/dL — ABNORMAL HIGH (ref <1.0)

## 2021-01-18 LAB — D-DIMER, QUANTITATIVE: D-Dimer, Quant: 0.98 ug/mL-FEU — ABNORMAL HIGH (ref 0.00–0.50)

## 2021-01-18 MED ORDER — AZITHROMYCIN 250 MG PO TABS: ORAL_TABLET | ORAL | 0 refills | Status: DC

## 2021-01-18 MED ORDER — CEPHALEXIN 500 MG PO CAPS: ORAL_CAPSULE | ORAL | 0 refills | Status: DC

## 2021-01-18 MED ORDER — IOHEXOL 350 MG/ML SOLN
80.0000 mL | Freq: Once | INTRAVENOUS | Status: AC | PRN
  Administered 2021-01-18: 80 mL via INTRAVENOUS

## 2021-01-18 MED ORDER — SODIUM CHLORIDE 0.9 % IV SOLN
1.0000 g | Freq: Once | INTRAVENOUS | Status: AC
  Administered 2021-01-18: 1 g via INTRAVENOUS
  Filled 2021-01-18: qty 10

## 2021-01-18 NOTE — ED Provider Notes (Signed)
[facility] EMERGENCY DEPARTMENT Provider Note   CSN: [id number] Arrival date & time: 01/18/21  1405     History Chief Complaint  Patient presents with   Shortness of Breath   Fever   Chills    Jorge Clements is a 75 y.o. male with a past medical history of AAA, CAD, COPD, hyperlipidemia, hypertension, PAD status post right femoropopliteal bypass and right transmetatarsal amputation.  He has a chronic ulceration of his right foot as well as a history of subacute osteomyelitis of the right ankle.  Patient presents today with chief complaint of flulike symptoms.  He had onset of subjective fevers and shaking chills.  He feels short of breath.  This is worse with exertion, better with rest.  He has chronic exertional dyspnea and states that it is unchanged.  Has significant swelling in his right lower extremity as compared to the left but states that that is chronic for him.  He denies hemoptysis, nausea or vomiting.  He is a chronic cough which is unchanged.   Shortness of Breath Associated symptoms: fever   Fever     Past Medical History:  Diagnosis Date   Abdominal aortic aneurysm    no AAA on [place] in 2020   CAD (coronary artery disease)    Stent to RCA 2021   Chronic obstructive pulmonary disease    HLD (hyperlipidemia)    HTN (hypertension)    Peripheral artery disease    s/p R fem pop // s/p R transmet amp    Patient Active Problem List   Diagnosis Date Noted   Venous insufficiency (chronic) (peripheral)    Ulcer of right foot limited to breakdown of skin (HCC)    Chest pain, rule out acute myocardial infarction 12/23/2020   PAD (peripheral artery disease) (HCC) 12/23/2020   HLD (hyperlipidemia) 12/23/2020   ACS (acute coronary syndrome) (HCC) 12/01/2019   Non-ST elevation (NSTEMI) myocardial infarction (HCC) 12/01/2019   NSTEMI (non-ST elevated myocardial infarction) (HCC) 12/01/2019   History of transmetatarsal amputation of right foot (HCC)     Subacute osteomyelitis, right ankle and foot (HCC)    Infected blister of foot 08/13/2017   Pressure injury of skin 08/12/2017   Weakness 08/11/2017   COPD (chronic obstructive pulmonary disease) (HCC) 08/11/2017   Cellulitis of right lower extremity 08/11/2017   Fall at home, initial encounter 08/11/2017   Thrombocytopenia (HCC) 08/11/2017   Weakness generalized 08/11/2017   Hypokalemia 08/11/2017   Essential hypertension 08/11/2017    Past Surgical History:  Procedure Laterality Date   ABDOMINAL AORTOGRAM N/A 08/17/2017   Procedure: ABDOMINAL AORTOGRAM;  Surgeon: Maeola Harman, MD;  Location: [facility] INVASIVE CV LAB;  Service: Cardiovascular;  Laterality: N/A;   AMPUTATION Right 08/21/2017   Procedure: TRANSMETATARSAL AMPUTATION RIGHT FOOT;  Surgeon: Nadara Mustard, MD;  Location: [facility] OR;  Service: Orthopedics;  Laterality: Right;   AMPUTATION TOE Right 08/18/2017   Procedure: AMPUTATION RIGHT SECOND AND THIRD TOES;  Surgeon: Maeola Harman, MD;  Location: [facility] OR;  Service: Vascular;  Laterality: Right;   CORONARY BALLOON ANGIOPLASTY N/A 12/03/2019   Procedure: CORONARY BALLOON ANGIOPLASTY;  Surgeon: Corky Crafts, MD;  Location: [facility] INVASIVE CV LAB;  Service: Cardiovascular;  Laterality: N/A;   CORONARY STENT INTERVENTION N/A 12/03/2019   Procedure: CORONARY STENT INTERVENTION;  Surgeon: Corky Crafts, MD;  Location: [facility] INVASIVE CV LAB;  Service: Cardiovascular;  Laterality: N/A;   ENDARTERECTOMY FEMORAL Right 08/18/2017   Procedure: ENDARTERECTOMY RIGHT SUPERFICIAL FEMORAL/PROFUNDA;  Surgeon: Maeola Harman, MD;  Location: [facility] OR;  Service: Vascular;  Laterality: Right;   FEMORAL-POPLITEAL BYPASS GRAFT Right 08/18/2017   Procedure: BYPASS GRAFT RIGHT COMMON FEMORAL TO ABOVE KNEE POPLITEAL ARTERY USING RIGHT REVERSED GREAT SAPHENOUS VEIN;  Surgeon: Maeola Harman, MD;  Location: [facility] OR;  Service: Vascular;  Laterality: Right;   LEFT HEART CATH AND  CORONARY ANGIOGRAPHY N/A 12/03/2019   Procedure: LEFT HEART CATH AND CORONARY ANGIOGRAPHY;  Surgeon: Corky Crafts, MD;  Location: [facility] INVASIVE CV LAB;  Service: Cardiovascular;  Laterality: N/A;   LOWER EXTREMITY ANGIOGRAPHY Right 08/17/2017   Procedure: Lower Extremity Angiography;  Surgeon: Maeola Harman, MD;  Location: [facility] INVASIVE CV LAB;  Service: Cardiovascular;  Laterality: Right;   PERIPHERAL VASCULAR BALLOON ANGIOPLASTY Right 08/17/2017   Procedure: PERIPHERAL VASCULAR BALLOON ANGIOPLASTY;  Surgeon: Maeola Harman, MD;  Location: [facility] INVASIVE CV LAB;  Service: Cardiovascular;  Laterality: Right;  SFA UNABLE TO CROSS   VEIN HARVEST Right 08/18/2017   Procedure: VEIN HARVEST RIGHT GREAT SAPHENOUS;  Surgeon: Maeola Harman, MD;  Location: [facility] OR;  Service: Vascular;  Laterality: Right;   WOUND DEBRIDEMENT Right 08/18/2017   Procedure: DEBRIDEMENT WOUND RIGHT FOOT;  Surgeon: Maeola Harman, MD;  Location: [facility] OR;  Service: Vascular;  Laterality: Right;       Family History  Adopted: Yes    Social History   Tobacco Use   Smoking status: Former   Smokeless tobacco: Never  Vaping Use   Vaping Use: Former  Substance Use Topics   Alcohol use: Not Currently   Drug use: No    Home Medications Prior to Admission medications   Medication Sig Start Date End Date Taking? Authorizing Provider  Albuterol Sulfate 108 (90 Base) MCG/ACT AEPB Inhale 90 mcg into the lungs.    [provider]  aspirin 81 MG chewable tablet Chew 1 tablet (81 mg total) by mouth daily. 12/04/19   Filbert Schilder, NP  BREZTRI AEROSPHERE 160-9-4.8 MCG/ACT AERO Inhale 2 puffs into the lungs 2 (two) times daily. 07/09/19   [provider]  clopidogrel (PLAVIX) 75 MG tablet Take 1 tablet (75 mg total) by mouth daily. 09/29/20   Wendall Stade, MD  losartan (COZAAR) 50 MG tablet Take 1 tablet by mouth once daily 12/08/20   Netta Neat., NP  metoprolol  tartrate (LOPRESSOR) 25 MG tablet Take 1 tablet by mouth twice daily 08/21/20   Netta Neat., NP  Multiple Vitamins-Minerals (CENTRUM SILVER 50+MEN PO) Take by mouth.    [provider]  nitroGLYCERIN (NITROSTAT) 0.4 MG SL tablet Place 1 tablet (0.4 mg total) under the tongue every 5 (five) minutes as needed for chest pain. 12/13/19 03/18/21  Netta Neat., NP  rosuvastatin (CRESTOR) 40 MG tablet Take 1 tablet by mouth once daily 07/16/20   Georgie Chard D, NP    Allergies    Penicillins and Clindamycin  Review of Systems   Review of Systems  Constitutional:  Positive for fever.  Respiratory:  Positive for shortness of breath.   Ten systems reviewed and are negative for acute change, except as noted in the HPI.   Physical Exam Updated Vital Signs BP (!) 131/96 (BP Location: Left Arm)   Pulse 89   Temp 99.5 F (37.5 C) (Oral)   Resp 17   SpO2 97%   Physical Exam Vitals and nursing note reviewed.  Constitutional:      General: He is not in  acute distress.    Appearance: He is well-developed. He is obese. He is not diaphoretic.  HENT:     Head: Normocephalic and atraumatic.  Eyes:     General: No scleral icterus.    Conjunctiva/sclera: Conjunctivae normal.  Cardiovascular:     Rate and Rhythm: Normal rate and regular rhythm.     Heart sounds: Normal heart sounds.  Pulmonary:     Effort: Pulmonary effort is normal. No respiratory distress.     Breath sounds: Normal breath sounds. No decreased breath sounds, wheezing, rhonchi or rales.  Abdominal:     Palpations: Abdomen is soft.     Tenderness: There is no abdominal tenderness.  Musculoskeletal:     Cervical back: Normal range of motion and neck supple.     Right lower leg: 3+ Edema present.     Left lower leg: 1+ Edema present.     Comments: Status post transmetatarsal amputation of the right lower extremity.  Chronic skin changes associated with venous insufficiency and edema.  There is an ulceration on  the ball of the right foot.  No obvious signs of infection.  Skin:    General: Skin is warm and dry.  Neurological:     Mental Status: He is alert.  Psychiatric:        Behavior: Behavior normal.    ED Results / Procedures / Treatments   Labs (all labs ordered are listed, but only abnormal results are displayed) Labs Reviewed  CBC WITH DIFFERENTIAL/PLATELET - Abnormal; Notable for the following components:      Result Value   RDW 15.8 (*)    Platelets 117 (*)    Neutro Abs 8.5 (*)    Lymphs Abs 0.4 (*)    All other components within normal limits  BASIC METABOLIC PANEL - Abnormal; Notable for the following components:   Sodium 131 (*)    Chloride 95 (*)    Glucose, Bld 144 (*)    All other components within normal limits  RESP PANEL BY RT-PCR (FLU A&B, COVID) ARPGX2  BRAIN NATRIURETIC PEPTIDE    EKG None  Radiology DG Chest 2 View  Result Date: 01/18/2021 CLINICAL DATA:  Shortness of breath. EXAM: CHEST - 2 VIEW COMPARISON:  Chest x-ray 12/23/2020. FINDINGS: There is some strandy opacities in the lingula. The lungs are otherwise clear. There is no pleural effusion or pneumothorax. Cardiomediastinal silhouette is within normal limits. No acute fractures are identified. IMPRESSION: 1. Strandy opacities in the lingula favored is atelectasis. Electronically Signed   By: Darliss Cheney M.D.   On: 01/18/2021 15:51    Procedures Procedures   Medications Ordered in ED Medications - No data to display  ED Course  I have reviewed the triage vital signs and the nursing notes.  Pertinent labs & imaging results that were available during my care of the patient were reviewed by me and considered in my medical decision making (see chart for details).    MDM Rules/Calculators/A&P                            Final Clinical Impression(s) / ED Diagnoses Final diagnoses:  None  , 75 year old male who presents emergency department with chief complaint of flulike symptoms and  shortness of breath..The emergent differential diagnosis for shortness of breath includes, but is not limited to, Pulmonary edema, bronchoconstriction, Pneumonia, Pulmonary embolism, Pneumotherax/ Hemothorax, Dysrythmia, ACS.    I ordered and reviewed labs which included CBC  without abnormality, BMP with mildly elevated blood glucose and reflexive hyponatremia, BMP within normal limits, respiratory panel negative for COVID or influenza, sed and rate and CRP elevated.  D-dimer elevated.  I ordered and reviewed plain films of the chest right foot and right ankle.  2 view chest shows strandy opacities in the lingula questionable is atelectasis, foot and ankle x-ray showed no evidence of osteomyelitis or other acute abnormality.  CT angiogram shows peribronchial thickening consistent with either viral or bacterial infection.  No evidence of consolidation.    Patient without dyspnea at rest.  We will treat for an infectious etiology with Rocephin and discharged with Keflex and azithromycin.  Patient does not have any evidence of osteomyelitis.  Patient appears otherwise appropriate for discharge with close outpatient follow-up.  Discussed return precautions.  Patient comfortable with plan for discharge at this time.    Rx / DC Orders ED Discharge Orders     None        Arthor Captain, PA-C 01/19/21 1144    Gwyneth Sprout, MD 01/22/21 1759

## 2021-01-18 NOTE — ED Notes (Signed)
Patient verbalizes understanding of discharge instructions. Prescriptions reviewed. Opportunity for questioning and answers were provided. Armband removed by staff, pt discharged from ED to lobby to wait on ride.

## 2021-01-18 NOTE — ED Triage Notes (Signed)
Pt reports SOB, fever, and chills that started today.  Denies pain.  Last COVID tested 2 weeks ago- negative.

## 2021-01-18 NOTE — ED Notes (Signed)
Pt transported to xray 

## 2021-01-18 NOTE — ED Provider Notes (Signed)
Emergency Medicine Provider Triage Evaluation Note  Jorge Clements , a 75 y.o. male  was evaluated in triage.  Pt complains of shortness of breath that has been constant worsening over the last several days.  He reports associated subjective fever and chills.  No chest pain.  Does have a history of COPD.  Review of Systems  Positive:  Negative: See above  Physical Exam  BP (!) 182/63 (BP Location: Left Arm)   Pulse (!) 102   Temp 99.5 F (37.5 C) (Oral)   Resp 19   SpO2 94%  Gen:   Awake, no distress   Resp:  Normal effort  MSK:   Moves extremities without difficulty  Other:    Medical Decision Making  Medically screening exam initiated at 3:18 PM.  Appropriate orders placed.  [name] was informed that the remainder of the evaluation will be completed by another provider, this initial triage assessment does not replace that evaluation, and the importance of remaining in the ED until their evaluation is complete.     Honor Loh Markham, PA-C 01/18/21 1519    Derwood Kaplan, MD 01/19/21 [phone]

## 2021-01-18 NOTE — Discharge Instructions (Addendum)
Contact a health care provider if you have: A fever. Trouble sleeping because you cannot control your cough with cough medicine. Get help right away if: Your shortness of breath becomes worse. Your chest pain increases. Your sickness becomes worse, especially if you are an older adult or have a weak immune system. You cough up blood. 

## 2021-01-26 ENCOUNTER — Encounter: Payer: Self-pay | Admitting: Orthopedic Surgery

## 2021-01-26 NOTE — Progress Notes (Signed)
Office Visit Note   Patient: Jorge Clements           Date of Birth: 06-22-1945           MRN: [id number] Visit Date: 01/08/2021              Requested by: Benita Stabile, MD [address] [place],  [place] [address] PCP: Benita Stabile, MD  Chief Complaint  Patient presents with   Right Foot - Pain   Right Leg - Pain      HPI: Patient is a 75 year old gentleman who presents in follow-up for right foot plantar ulcer he is currently using a cane and a postoperative shoe he states he has some pain at times.  Compression wrap removed today.  Assessment & Plan: Visit Diagnoses:  1. Non-pressure chronic ulcer of other part of right foot limited to breakdown of skin (HCC)     Plan: Continue with compression wrap follow-up in 1 week the ulcer was debrided.  Follow-Up Instructions: Return in about 1 week (around 01/15/2021).   Ortho Exam  Patient is alert, oriented, no adenopathy, well-dressed, normal affect, normal respiratory effort. Examination there is decreased swelling and the skin is wrinkling well in the right lower extremity the ulcer has callus around the edges.  After informed consent a 10 blade knife was used to debride the skin and soft tissue back to bleeding viable granulation tissue.  This was touched with silver nitrate.  After debridement the ulcer is 5 cm in diameter and 3 mm deep.  A compression wrap is reapplied for the venous insufficiency.  Imaging: No results found. No images are attached to the encounter.  Labs: Lab Results  Component Value Date   HGBA1C 6.4 (H) 12/02/2019   ESRSEDRATE 28 (H) 01/18/2021   ESRSEDRATE 40 (H) 12/24/2020   CRP 11.1 (H) 01/18/2021   CRP 1.9 (H) 12/24/2020   REPTSTATUS 12/27/2020 FINAL 12/24/2020   GRAMSTAIN NO WBC SEEN NO ORGANISMS SEEN  12/24/2020   CULT  12/24/2020    FEW STAPHYLOCOCCUS AUREUS WITHIN MIXED ORGANISMS Performed at [facility], [address] [place], [place] [address]    [facility]  STAPHYLOCOCCUS AUREUS 12/24/2020     Lab Results  Component Value Date   ALBUMIN 3.9 12/01/2019   ALBUMIN 3.7 09/26/2017   ALBUMIN 2.3 (L) 08/17/2017    Lab Results  Component Value Date   MG 2.2 08/17/2017   MG 1.8 08/15/2017   No results found for: VD25OH  No results found for: PREALBUMIN CBC EXTENDED Latest Ref Rng & Units 01/18/2021 12/25/2020 12/23/2020  WBC 4.0 - 10.5 K/uL 9.7 6.8 8.2  RBC 4.22 - 5.81 MIL/uL 4.84 4.32 4.83  HGB 13.0 - 17.0 g/dL 81.8 12.4(L) 13.3  HCT 39.0 - 52.0 % 42.1 38.1(L) 42.5  PLT 150 - 400 K/uL 117(L) 118(L) 129(L)  NEUTROABS 1.7 - 7.7 K/uL 8.5(H) - -  LYMPHSABS 0.7 - 4.0 K/uL 0.4(L) - -     There is no height or weight on file to calculate BMI.  Orders:  No orders of the defined types were placed in this encounter.  No orders of the defined types were placed in this encounter.    Procedures: No procedures performed  Clinical Data: No additional findings.  ROS:  All other systems negative, except as noted in the HPI. Review of Systems  Objective: Vital Signs: There were no vitals taken for this visit.  Specialty Comments:  No specialty comments available.  PMFS History: Patient Active Problem List   Diagnosis Date Noted   Venous insufficiency (chronic) (peripheral)    Ulcer of right foot limited to breakdown of skin (HCC)    Chest pain, rule out acute myocardial infarction 12/23/2020   PAD (peripheral artery disease) (HCC) 12/23/2020   HLD (hyperlipidemia) 12/23/2020   ACS (acute coronary syndrome) (HCC) 12/01/2019   Non-ST elevation (NSTEMI) myocardial infarction (HCC) 12/01/2019   NSTEMI (non-ST elevated myocardial infarction) (HCC) 12/01/2019   History of transmetatarsal amputation of right foot (HCC)    Subacute osteomyelitis, right ankle and foot (HCC)    Infected blister of foot 08/13/2017   Pressure injury of skin 08/12/2017   Weakness 08/11/2017   COPD (chronic obstructive pulmonary disease) (HCC) 08/11/2017    Cellulitis of right lower extremity 08/11/2017   Fall at home, initial encounter 08/11/2017   Thrombocytopenia (HCC) 08/11/2017   Weakness generalized 08/11/2017   Hypokalemia 08/11/2017   Essential hypertension 08/11/2017   Past Medical History:  Diagnosis Date   Abdominal aortic aneurysm    no AAA on [place] in 2020   CAD (coronary artery disease)    Stent to RCA 2021   Chronic obstructive pulmonary disease    HLD (hyperlipidemia)    HTN (hypertension)    Peripheral artery disease    s/p R fem pop // s/p R transmet amp    Family History  Adopted: Yes    Past Surgical History:  Procedure Laterality Date   ABDOMINAL AORTOGRAM N/A 08/17/2017   Procedure: ABDOMINAL AORTOGRAM;  Surgeon: Maeola Harman, MD;  Location: [facility] INVASIVE CV LAB;  Service: Cardiovascular;  Laterality: N/A;   AMPUTATION Right 08/21/2017   Procedure: TRANSMETATARSAL AMPUTATION RIGHT FOOT;  Surgeon: Nadara Mustard, MD;  Location: [facility] OR;  Service: Orthopedics;  Laterality: Right;   AMPUTATION TOE Right 08/18/2017   Procedure: AMPUTATION RIGHT SECOND AND THIRD TOES;  Surgeon: Maeola Harman, MD;  Location: [facility] OR;  Service: Vascular;  Laterality: Right;   CORONARY BALLOON ANGIOPLASTY N/A 12/03/2019   Procedure: CORONARY BALLOON ANGIOPLASTY;  Surgeon: Corky Crafts, MD;  Location: [facility] INVASIVE CV LAB;  Service: Cardiovascular;  Laterality: N/A;   CORONARY STENT INTERVENTION N/A 12/03/2019   Procedure: CORONARY STENT INTERVENTION;  Surgeon: Corky Crafts, MD;  Location: [facility] INVASIVE CV LAB;  Service: Cardiovascular;  Laterality: N/A;   ENDARTERECTOMY FEMORAL Right 08/18/2017   Procedure: ENDARTERECTOMY RIGHT SUPERFICIAL Alfredia Ferguson;  Surgeon: Maeola Harman, MD;  Location: [facility] OR;  Service: Vascular;  Laterality: Right;   FEMORAL-POPLITEAL BYPASS GRAFT Right 08/18/2017   Procedure: BYPASS GRAFT RIGHT COMMON FEMORAL TO ABOVE KNEE POPLITEAL ARTERY USING RIGHT REVERSED GREAT SAPHENOUS  VEIN;  Surgeon: Maeola Harman, MD;  Location: [facility] OR;  Service: Vascular;  Laterality: Right;   LEFT HEART CATH AND CORONARY ANGIOGRAPHY N/A 12/03/2019   Procedure: LEFT HEART CATH AND CORONARY ANGIOGRAPHY;  Surgeon: Corky Crafts, MD;  Location: [facility] INVASIVE CV LAB;  Service: Cardiovascular;  Laterality: N/A;   LOWER EXTREMITY ANGIOGRAPHY Right 08/17/2017   Procedure: Lower Extremity Angiography;  Surgeon: Maeola Harman, MD;  Location: [facility] INVASIVE CV LAB;  Service: Cardiovascular;  Laterality: Right;   PERIPHERAL VASCULAR BALLOON ANGIOPLASTY Right 08/17/2017   Procedure: PERIPHERAL VASCULAR BALLOON ANGIOPLASTY;  Surgeon: Maeola Harman, MD;  Location: [facility] INVASIVE CV LAB;  Service: Cardiovascular;  Laterality: Right;  SFA UNABLE TO CROSS   VEIN HARVEST Right 08/18/2017   Procedure: VEIN HARVEST RIGHT GREAT SAPHENOUS;  Surgeon: Maeola Harman, MD;  Location: [facility] OR;  Service: Vascular;  Laterality: Right;   WOUND DEBRIDEMENT Right 08/18/2017   Procedure: DEBRIDEMENT WOUND RIGHT FOOT;  Surgeon: Maeola Harman, MD;  Location: [facility] OR;  Service: Vascular;  Laterality: Right;   Social History   Occupational History   Not on file  Tobacco Use   Smoking status: Former   Smokeless tobacco: Never  Vaping Use   Vaping Use: Former  Substance and Sexual Activity   Alcohol use: Not Currently   Drug use: No   Sexual activity: Not on file

## 2021-01-27 ENCOUNTER — Encounter (HOSPITAL_COMMUNITY): Payer: Medicare HMO

## 2021-01-27 ENCOUNTER — Encounter (HOSPITAL_COMMUNITY): Admission: RE | Admit: 2021-01-27 | Payer: Medicare HMO | Source: Ambulatory Visit

## 2021-01-29 ENCOUNTER — Ambulatory Visit: Payer: Medicare HMO | Admitting: Orthopedic Surgery

## 2021-02-05 ENCOUNTER — Ambulatory Visit (INDEPENDENT_AMBULATORY_CARE_PROVIDER_SITE_OTHER): Payer: Medicare HMO | Admitting: Orthopedic Surgery

## 2021-02-05 DIAGNOSIS — L97511 Non-pressure chronic ulcer of other part of right foot limited to breakdown of skin: Secondary | ICD-10-CM

## 2021-02-05 DIAGNOSIS — M79671 Pain in right foot: Secondary | ICD-10-CM

## 2021-02-08 ENCOUNTER — Encounter: Payer: Self-pay | Admitting: Orthopedic Surgery

## 2021-02-08 NOTE — Progress Notes (Signed)
Office Visit Note   Patient: Jorge Clements           Date of Birth: 1945-12-13           MRN: [id number] Visit Date: 02/05/2021              Requested by: Benita Stabile, MD [address] [place],  [place] [address] PCP: Benita Stabile, MD  Chief Complaint  Patient presents with   Right Foot - Wound Check      HPI: Patient is a 75 year old gentleman who presents in follow-up for [facility] grade 1 ulcer right foot.  Assessment & Plan: Visit Diagnoses:  1. Non-pressure chronic ulcer of other part of right foot limited to breakdown of skin (HCC)     Plan: Ulcer debrided pressure offloaded.  Follow-Up Instructions: Return in about 3 weeks (around 02/26/2021).   Ortho Exam  Patient is alert, oriented, no adenopathy, well-dressed, normal affect, normal respiratory effort. Examination patient has decreased dermatitis there is increased nonviable tissue around the wound.  After informed consent a 10 blade knife was used to debride the skin and soft tissue back to healthy viable granulation tissue this was touched with silver nitrate a Band-Aid was applied after debridement the ulcer is 2 cm in diameter 2 mm deep and was 1 cm diameter prior to debridement.  Imaging: No results found. No images are attached to the encounter.  Labs: Lab Results  Component Value Date   HGBA1C 6.4 (H) 12/02/2019   ESRSEDRATE 28 (H) 01/18/2021   ESRSEDRATE 40 (H) 12/24/2020   CRP 11.1 (H) 01/18/2021   CRP 1.9 (H) 12/24/2020   REPTSTATUS 12/27/2020 FINAL 12/24/2020   GRAMSTAIN NO WBC SEEN NO ORGANISMS SEEN  12/24/2020   CULT  12/24/2020    FEW STAPHYLOCOCCUS AUREUS WITHIN MIXED ORGANISMS Performed at [facility], [address], [place], [place] [address]    [facility] STAPHYLOCOCCUS AUREUS 12/24/2020     Lab Results  Component Value Date   ALBUMIN 3.9 12/01/2019   ALBUMIN 3.7 09/26/2017   ALBUMIN 2.3 (L) 08/17/2017    Lab Results  Component Value Date   MG 2.2 08/17/2017   MG  1.8 08/15/2017   No results found for: VD25OH  No results found for: PREALBUMIN CBC EXTENDED Latest Ref Rng & Units 01/18/2021 12/25/2020 12/23/2020  WBC 4.0 - 10.5 K/uL 9.7 6.8 8.2  RBC 4.22 - 5.81 MIL/uL 4.84 4.32 4.83  HGB 13.0 - 17.0 g/dL 50.9 12.4(L) 13.3  HCT 39.0 - 52.0 % 42.1 38.1(L) 42.5  PLT 150 - 400 K/uL 117(L) 118(L) 129(L)  NEUTROABS 1.7 - 7.7 K/uL 8.5(H) - -  LYMPHSABS 0.7 - 4.0 K/uL 0.4(L) - -     There is no height or weight on file to calculate BMI.  Orders:  No orders of the defined types were placed in this encounter.  No orders of the defined types were placed in this encounter.    Procedures: No procedures performed  Clinical Data: No additional findings.  ROS:  All other systems negative, except as noted in the HPI. Review of Systems  Objective: Vital Signs: There were no vitals taken for this visit.  Specialty Comments:  No specialty comments available.  PMFS History: Patient Active Problem List   Diagnosis Date Noted   Venous insufficiency (chronic) (peripheral)    Ulcer of right foot limited to breakdown of skin (HCC)    Chest pain, rule out acute myocardial infarction 12/23/2020   PAD (  peripheral artery disease) (HCC) 12/23/2020   HLD (hyperlipidemia) 12/23/2020   ACS (acute coronary syndrome) (HCC) 12/01/2019   Non-ST elevation (NSTEMI) myocardial infarction (HCC) 12/01/2019   NSTEMI (non-ST elevated myocardial infarction) (HCC) 12/01/2019   History of transmetatarsal amputation of right foot (HCC)    Subacute osteomyelitis, right ankle and foot (HCC)    Infected blister of foot 08/13/2017   Pressure injury of skin 08/12/2017   Weakness 08/11/2017   COPD (chronic obstructive pulmonary disease) (HCC) 08/11/2017   Cellulitis of right lower extremity 08/11/2017   Fall at home, initial encounter 08/11/2017   Thrombocytopenia (HCC) 08/11/2017   Weakness generalized 08/11/2017   Hypokalemia 08/11/2017   Essential hypertension 08/11/2017    Past Medical History:  Diagnosis Date   Abdominal aortic aneurysm    no AAA on [place] in 2020   CAD (coronary artery disease)    Stent to RCA 2021   Chronic obstructive pulmonary disease    HLD (hyperlipidemia)    HTN (hypertension)    Peripheral artery disease    s/p R fem pop // s/p R transmet amp    Family History  Adopted: Yes    Past Surgical History:  Procedure Laterality Date   ABDOMINAL AORTOGRAM N/A 08/17/2017   Procedure: ABDOMINAL AORTOGRAM;  Surgeon: Maeola Harman, MD;  Location: [facility] INVASIVE CV LAB;  Service: Cardiovascular;  Laterality: N/A;   AMPUTATION Right 08/21/2017   Procedure: TRANSMETATARSAL AMPUTATION RIGHT FOOT;  Surgeon: Nadara Mustard, MD;  Location: [facility] OR;  Service: Orthopedics;  Laterality: Right;   AMPUTATION TOE Right 08/18/2017   Procedure: AMPUTATION RIGHT SECOND AND THIRD TOES;  Surgeon: Maeola Harman, MD;  Location: [facility] OR;  Service: Vascular;  Laterality: Right;   CORONARY BALLOON ANGIOPLASTY N/A 12/03/2019   Procedure: CORONARY BALLOON ANGIOPLASTY;  Surgeon: Corky Crafts, MD;  Location: [facility] INVASIVE CV LAB;  Service: Cardiovascular;  Laterality: N/A;   CORONARY STENT INTERVENTION N/A 12/03/2019   Procedure: CORONARY STENT INTERVENTION;  Surgeon: Corky Crafts, MD;  Location: [facility] INVASIVE CV LAB;  Service: Cardiovascular;  Laterality: N/A;   ENDARTERECTOMY FEMORAL Right 08/18/2017   Procedure: ENDARTERECTOMY RIGHT SUPERFICIAL Alfredia Ferguson;  Surgeon: Maeola Harman, MD;  Location: [facility] OR;  Service: Vascular;  Laterality: Right;   FEMORAL-POPLITEAL BYPASS GRAFT Right 08/18/2017   Procedure: BYPASS GRAFT RIGHT COMMON FEMORAL TO ABOVE KNEE POPLITEAL ARTERY USING RIGHT REVERSED GREAT SAPHENOUS VEIN;  Surgeon: Maeola Harman, MD;  Location: [facility] OR;  Service: Vascular;  Laterality: Right;   LEFT HEART CATH AND CORONARY ANGIOGRAPHY N/A 12/03/2019   Procedure: LEFT HEART CATH AND CORONARY ANGIOGRAPHY;  Surgeon:  Corky Crafts, MD;  Location: [facility] INVASIVE CV LAB;  Service: Cardiovascular;  Laterality: N/A;   LOWER EXTREMITY ANGIOGRAPHY Right 08/17/2017   Procedure: Lower Extremity Angiography;  Surgeon: Maeola Harman, MD;  Location: [facility] INVASIVE CV LAB;  Service: Cardiovascular;  Laterality: Right;   PERIPHERAL VASCULAR BALLOON ANGIOPLASTY Right 08/17/2017   Procedure: PERIPHERAL VASCULAR BALLOON ANGIOPLASTY;  Surgeon: Maeola Harman, MD;  Location: [facility] INVASIVE CV LAB;  Service: Cardiovascular;  Laterality: Right;  SFA UNABLE TO CROSS   VEIN HARVEST Right 08/18/2017   Procedure: VEIN HARVEST RIGHT GREAT SAPHENOUS;  Surgeon: Maeola Harman, MD;  Location: [facility] OR;  Service: Vascular;  Laterality: Right;   WOUND DEBRIDEMENT Right 08/18/2017   Procedure: DEBRIDEMENT WOUND RIGHT FOOT;  Surgeon: Maeola Harman, MD;  Location: [facility] OR;  Service: Vascular;  Laterality: Right;   Social History  Occupational History   Not on file  Tobacco Use   Smoking status: Former   Smokeless tobacco: Never  Vaping Use   Vaping Use: Former  Substance and Sexual Activity   Alcohol use: Not Currently   Drug use: No   Sexual activity: Not on file

## 2021-02-18 ENCOUNTER — Encounter (HOSPITAL_COMMUNITY): Payer: Self-pay | Admitting: Radiology

## 2021-02-18 NOTE — Progress Notes (Addendum)
Cardiology Office Note    Date:  02/19/2021   ID:  Jorge Clements, DOB Mar 25, 1946, MRN [id number]  PCP:  Benita Stabile, MD  Cardiologist: Charlton Haws, MD    Chief Complaint  Patient presents with   Hospitalization Follow-up    History of Present Illness:    Jorge Clements is a 75 y.o. male with past medical history of CAD (s/p NSTEMI in 11/2019 with DES to RCA and thrombectomy of RPAV), PAD (s/p right fem-pop bypass), HTN, HLD and COPD who presents to the office today for hospital follow-up.  He was admitted to [facility] from 9/6 - 12/25/2020 for evaluation of chest pain, left hand numbness and generalized weakness. His troponin values remained negative and EKG showed no acute ST changes when compared to prior tracings. No further inpatient evaluation was pursued and it was recommended that he have an outpatient stress test to rule out ischemia. He did have a nonhealing wound along his right foot and outpatient follow-up with Orthopedics and Vascular Surgery was also recommended. In the interim, he was evaluated in the ED in 01/2021 and treated for possible PNA with Azithromycin and Cephalexin.   In talking with the patient today, he reports having baseline dyspnea on exertion but this has improved since recent antibiotic therapy. He is a former 50+ year smoker but quit several years ago but has known COPD and uses his inhalers as needed. He denies any recurrent chest pain. He did not have his stress test given no recurrent symptoms and due to concerns with Lexiscan. He denies any specific orthopnea, PND or pitting edema.    Past Medical History:  Diagnosis Date   Abdominal aortic aneurysm    no AAA on [place] in 2020   CAD (coronary artery disease)    Stent to RCA 2021   Chronic obstructive pulmonary disease    HLD (hyperlipidemia)    HTN (hypertension)    Peripheral artery disease    s/p R fem pop // s/p R transmet amp    Past Surgical History:  Procedure Laterality Date    ABDOMINAL AORTOGRAM N/A 08/17/2017   Procedure: ABDOMINAL AORTOGRAM;  Surgeon: Maeola Harman, MD;  Location: [facility] INVASIVE CV LAB;  Service: Cardiovascular;  Laterality: N/A;   AMPUTATION Right 08/21/2017   Procedure: TRANSMETATARSAL AMPUTATION RIGHT FOOT;  Surgeon: Nadara Mustard, MD;  Location: [facility] OR;  Service: Orthopedics;  Laterality: Right;   AMPUTATION TOE Right 08/18/2017   Procedure: AMPUTATION RIGHT SECOND AND THIRD TOES;  Surgeon: Maeola Harman, MD;  Location: [facility] OR;  Service: Vascular;  Laterality: Right;   CORONARY BALLOON ANGIOPLASTY N/A 12/03/2019   Procedure: CORONARY BALLOON ANGIOPLASTY;  Surgeon: Corky Crafts, MD;  Location: [facility] INVASIVE CV LAB;  Service: Cardiovascular;  Laterality: N/A;   CORONARY STENT INTERVENTION N/A 12/03/2019   Procedure: CORONARY STENT INTERVENTION;  Surgeon: Corky Crafts, MD;  Location: [facility] INVASIVE CV LAB;  Service: Cardiovascular;  Laterality: N/A;   ENDARTERECTOMY FEMORAL Right 08/18/2017   Procedure: ENDARTERECTOMY RIGHT SUPERFICIAL Alfredia Ferguson;  Surgeon: Maeola Harman, MD;  Location: [facility] OR;  Service: Vascular;  Laterality: Right;   FEMORAL-POPLITEAL BYPASS GRAFT Right 08/18/2017   Procedure: BYPASS GRAFT RIGHT COMMON FEMORAL TO ABOVE KNEE POPLITEAL ARTERY USING RIGHT REVERSED GREAT SAPHENOUS VEIN;  Surgeon: Maeola Harman, MD;  Location: [facility] OR;  Service: Vascular;  Laterality: Right;   LEFT HEART CATH AND CORONARY ANGIOGRAPHY N/A 12/03/2019   Procedure: LEFT HEART CATH AND CORONARY ANGIOGRAPHY;  Surgeon: Eldridge Dace,  Donnie Coffin, MD;  Location: [facility] INVASIVE CV LAB;  Service: Cardiovascular;  Laterality: N/A;   LOWER EXTREMITY ANGIOGRAPHY Right 08/17/2017   Procedure: Lower Extremity Angiography;  Surgeon: Maeola Harman, MD;  Location: [facility] INVASIVE CV LAB;  Service: Cardiovascular;  Laterality: Right;   PERIPHERAL VASCULAR BALLOON ANGIOPLASTY Right 08/17/2017   Procedure: PERIPHERAL VASCULAR BALLOON  ANGIOPLASTY;  Surgeon: Maeola Harman, MD;  Location: [facility] INVASIVE CV LAB;  Service: Cardiovascular;  Laterality: Right;  SFA UNABLE TO CROSS   VEIN HARVEST Right 08/18/2017   Procedure: VEIN HARVEST RIGHT GREAT SAPHENOUS;  Surgeon: Maeola Harman, MD;  Location: [facility] OR;  Service: Vascular;  Laterality: Right;   WOUND DEBRIDEMENT Right 08/18/2017   Procedure: DEBRIDEMENT WOUND RIGHT FOOT;  Surgeon: Maeola Harman, MD;  Location: [facility] OR;  Service: Vascular;  Laterality: Right;    Current Medications: Outpatient Medications Prior to Visit  Medication Sig Dispense Refill   Albuterol Sulfate 108 (90 Base) MCG/ACT AEPB Inhale 90 mcg into the lungs.     aspirin 81 MG chewable tablet Chew 1 tablet (81 mg total) by mouth daily.     BREZTRI AEROSPHERE 160-9-4.8 MCG/ACT AERO Inhale 2 puffs into the lungs 2 (two) times daily.     clopidogrel (PLAVIX) 75 MG tablet Take 1 tablet (75 mg total) by mouth daily. 90 tablet 3   losartan (COZAAR) 50 MG tablet Take 1 tablet by mouth once daily 90 tablet 3   metoprolol tartrate (LOPRESSOR) 25 MG tablet Take 1 tablet by mouth twice daily 180 tablet 1   Multiple Vitamins-Minerals (CENTRUM SILVER 50+MEN PO) Take by mouth.     nitroGLYCERIN (NITROSTAT) 0.4 MG SL tablet Place 1 tablet (0.4 mg total) under the tongue every 5 (five) minutes as needed for chest pain. 25 tablet 3   rosuvastatin (CRESTOR) 40 MG tablet Take 1 tablet by mouth once daily 180 tablet 2   azithromycin (ZITHROMAX Z-PAK) 250 MG tablet 2 po day one, then 1 daily x 4 days 6 tablet 0   cephALEXin (KEFLEX) 500 MG capsule 2 caps po bid x 7 days 28 capsule 0   No facility-administered medications prior to visit.     Allergies:   Penicillins and Clindamycin   Social History   Socioeconomic History   Marital status: Divorced    Spouse name: Not on file   Number of children: Not on file   Years of education: Not on file   Highest education level: Not on file   Occupational History   Not on file  Tobacco Use   Smoking status: Former   Smokeless tobacco: Never  Vaping Use   Vaping Use: Former  Substance and Sexual Activity   Alcohol use: Not Currently   Drug use: No   Sexual activity: Not on file  Other Topics Concern   Not on file  Social History Narrative   Not on file   Social Determinants of Health   Financial Resource Strain: Not on file  Food Insecurity: Not on file  Transportation Needs: Not on file  Physical Activity: Not on file  Stress: Not on file  Social Connections: Not on file     Family History:  The patient's family history is not on file. He was adopted.   Review of Systems:    Please see the history of present illness.     All other systems reviewed and are otherwise negative except as noted above.   Physical Exam:    VS:  BP Marland Kitchen)  142/82   Pulse 70   Ht 6\' 1"  (1.854 m)   Wt 287 lb (130.2 kg)   SpO2 95%   BMI 37.87 kg/m    General: Well developed, well nourished,male appearing in no acute distress. Head: Normocephalic, atraumatic. Neck: No carotid bruits. JVD not elevated.  Lungs: Respirations regular and unlabored, without wheezes or rales.  Heart: Regular rate and rhythm. No S3 or S4.  No murmur, no rubs, or gallops appreciated. Abdomen: Appears non-distended. No obvious abdominal masses. Msk:  Strength and tone appear normal for age. No obvious joint deformities or effusions. Extremities: No clubbing or cyanosis. No pitting edema. Right foot wrapped.  Neuro: Alert and oriented X 3. Moves all extremities spontaneously. No focal deficits noted. Psych:  Responds to questions appropriately with a normal affect. Skin: No rashes or lesions noted  Wt Readings from Last 3 Encounters:  02/19/21 287 lb (130.2 kg)  12/24/20 279 lb (126.6 kg)  09/29/20 279 lb (126.6 kg)     Studies/Labs Reviewed:   EKG:  EKG is not ordered today.    Recent Labs: 01/18/2021: B Natriuretic Peptide 96.7; BUN 18;  Creatinine, Ser 1.17; Hemoglobin 13.7; Platelets 117; Potassium 4.3; Sodium 131   Lipid Panel    Component Value Date/Time   CHOL 90 12/24/2020 0326   TRIG 135 12/24/2020 0326   HDL 29 (L) 12/24/2020 0326   CHOLHDL 3.1 12/24/2020 0326   VLDL 27 12/24/2020 0326   LDLCALC 34 12/24/2020 0326    Additional studies/ records that were reviewed today include:   Cardiac Catheterization: 11/2019 High bifurcation of the posterior descending artery and posterior lateral artery in the mid RCA. RCA lesion, right at this bifurcation, is 99% stenosed. A drug-eluting stent was successfully placed using a STENT RESOLUTE ONYX 2.5X30, postdilated with a 2.75 balloon, including final kissing balloon angioplasty. Post intervention, there is a 0% residual stenosis. RPAV lesion is 99% stenosed. Thrombectomy was performed. Final, kissing balloon angioplasty was performed using a BALLOON SAPPHIRE 2.25X15. Post intervention, there is a 40% residual stenosis. Prox LAD to Mid LAD lesion is 25% stenosed. Prox RCA lesion is 25% stenosed. The left ventricular systolic function is normal. LV end diastolic pressure is normal. The left ventricular ejection fraction is 55-65% by visual estimate. There is no aortic valve stenosis. Balloon angioplasty was performed using a BALLOON SAPPHIRE 2.25X15. A drug-eluting stent was successfully placed using a STENT RESOLUTE ONYX 2.5X30.   Continue aggressive secondary prevention.  He will need dual antiplatelet therapy for a year.  High-dose statin as well.   I offered to speak with his family but he declined.   Echocardiogram: 11/2019 IMPRESSIONS     1. Left ventricular ejection fraction, by estimation, is 65 to 70%. The  left ventricle has normal function. The left ventricle has no regional  wall motion abnormalities. Left ventricular diastolic parameters were  normal.   2. Right ventricular systolic function is normal. The right ventricular  size is normal. There is  normal pulmonary artery systolic pressure.   3. The mitral valve is normal in structure. Trivial mitral valve  regurgitation. No evidence of mitral stenosis.   4. The aortic valve is grossly normal. Aortic valve regurgitation is not  visualized. No aortic stenosis is present.   5. The inferior vena cava is normal in size with greater than 50%  respiratory variability, suggesting right atrial pressure of 3 mmHg.   Comparison(s): No prior Echocardiogram.    Assessment:    1. Coronary artery disease  involving native coronary artery of native heart without angina pectoris   2. Essential hypertension   3. Hyperlipidemia LDL goal <70   4. PVD (peripheral vascular disease) (HCC)      Plan:   In order of problems listed above:  1. CAD - He is s/p NSTEMI in 11/2019 with DES to RCA and thrombectomy of RPAV. He did have chest pain during his recent admission and ruled out for ACS. He canceled his outpatient stress test and does not wish to reschedule at this time given no recurrent symptoms. I encouraged him to make [place] aware if he does have recurrent symptoms, as this could be rescheduled.  - He has been on ASA and Plavix for over 1 year and as previously recommended by Dr. [place], Plavix could be stopped after 11/2020. I will send a message to Vascular Surgery to make sure they do not wish for him to remain on this given his PAD. Continue ASA 81mg  daily, Lopressor 25mg  BID and Crestor 40mg  daily.   Addendum: I did hear back from Dr. and he said the patient could be on ASA alone from their perspective. I therefore informed the patient that he should remain on ASA and can stop Plavix. He will finish his current bottle and then stop the medication.  2. HTN - BP initially elevated to 178/70, rechecked and improved to 142/82. He reports this has been well-controlled at prior office visits but he had to walk a long distance from his car to the office and thinks this contributed. Continue current  regimen for now with Losartan 50mg  daily and Lopressor 25mg  BID.   3. HLD - FLP during recent admission showed LDL at 34. Continue Crestor 40mg  daily.   4. PAD - He is s/p right fem-pop bypass and is currently being followed by Orthopedics and Vascular given a nonhealing wound along his right foot. Remains on ASA, Plavix and statin therapy.    Medication Adjustments/Labs and Tests Ordered: Current medicines are reviewed at length with the patient today.  Concerns regarding medicines are outlined above.  Medication changes, Labs and Tests ordered today are listed in the Patient Instructions below. Patient Instructions  Medication Instructions:   Continue current medication regimen. We will make you aware of Vascular Surgery's recommendation in regards to Plavix.   Labwork:  None.   Testing/Procedures:  None.   Follow-Up:  Follow-up with Dr. in 6 months.   Any Other Special Instructions Will Be Listed Below (If Applicable).     If you need a refill on your cardiac medications before your next appointment, please call your pharmacy.   Signed, , PA-C  02/19/2021 6:53 PM    [facility] [address] [place], [place] Phone: [phone] Fax: [phone]

## 2021-02-19 ENCOUNTER — Other Ambulatory Visit: Payer: Self-pay

## 2021-02-19 ENCOUNTER — Ambulatory Visit: Payer: Medicare HMO | Admitting: Student

## 2021-02-19 ENCOUNTER — Encounter: Payer: Self-pay | Admitting: Student

## 2021-02-19 VITALS — BP 142/82 | HR 70 | Ht 73.0 in | Wt 287.0 lb

## 2021-02-19 DIAGNOSIS — E785 Hyperlipidemia, unspecified: Secondary | ICD-10-CM

## 2021-02-19 DIAGNOSIS — I10 Essential (primary) hypertension: Secondary | ICD-10-CM

## 2021-02-19 DIAGNOSIS — I2510 Atherosclerotic heart disease of native coronary artery without angina pectoris: Secondary | ICD-10-CM | POA: Diagnosis not present

## 2021-02-19 DIAGNOSIS — I739 Peripheral vascular disease, unspecified: Secondary | ICD-10-CM | POA: Diagnosis not present

## 2021-02-19 NOTE — Patient Instructions (Signed)
Medication Instructions:   Continue current medication regimen. We will make you aware of Vascular Surgery's recommendation in regards to Plavix.   Labwork:  None.   Testing/Procedures:  None.   Follow-Up:  Follow-up with Dr. [place] Em[place] in 6 months.   Any Other Special Instructions Will Be Listed Below (If Applicable).     If you need a refill on your cardiac medications before your next appointment, please call your pharmacy.

## 2021-02-20 NOTE — Addendum Note (Signed)
Addended by: Ellsworth Lennox on: 02/20/2021 11:34 AM   Modules accepted: Orders

## 2021-02-25 ENCOUNTER — Telehealth: Payer: Self-pay | Admitting: Orthopedic Surgery

## 2021-02-25 NOTE — Telephone Encounter (Signed)
Left message for pt to return call and get resch'd for his appt tomorrow afternoon with Lajoyce Corners. Lajoyce Corners getting called out of the office tomorrow afternoon per Autumn and pt can come in anytime this afternoon 02/25/21 or get on next weeks sch.

## 2021-02-26 ENCOUNTER — Ambulatory Visit: Payer: Medicare HMO | Admitting: Orthopedic Surgery

## 2021-03-04 ENCOUNTER — Other Ambulatory Visit: Payer: Self-pay

## 2021-03-04 DIAGNOSIS — I739 Peripheral vascular disease, unspecified: Secondary | ICD-10-CM

## 2021-03-05 ENCOUNTER — Other Ambulatory Visit: Payer: Self-pay

## 2021-03-05 ENCOUNTER — Ambulatory Visit (INDEPENDENT_AMBULATORY_CARE_PROVIDER_SITE_OTHER): Payer: Medicare HMO | Admitting: Orthopedic Surgery

## 2021-03-05 DIAGNOSIS — L97511 Non-pressure chronic ulcer of other part of right foot limited to breakdown of skin: Secondary | ICD-10-CM

## 2021-03-06 ENCOUNTER — Encounter: Payer: Self-pay | Admitting: Orthopedic Surgery

## 2021-03-06 NOTE — Progress Notes (Signed)
Office Visit Note   Patient: Jorge Clements           Date of Birth: 06-30-1945           MRN: [id number] Visit Date: 03/05/2021              Requested by: Benita Stabile, MD [address] [place],  [place] [address] PCP: Benita Stabile, MD  Chief Complaint  Patient presents with   Right Foot - Wound Check      HPI: Patient is a 75 year old gentleman who is status post a right transmetatarsal amputation he has had persistent ulceration on the plantar aspect of the right foot as well as venous insufficiency ulceration.  Patient states he is currently wearing compression socks.  Assessment & Plan: Visit Diagnoses:  1. Non-pressure chronic ulcer of other part of right foot limited to breakdown of skin (HCC)     Plan: Continue with compression socks ulcer debrided of skin and soft tissue he has good orthotics continue routine wound care.  Follow-Up Instructions: Return in about 4 weeks (around 04/02/2021).   Ortho Exam  Patient is alert, oriented, no adenopathy, well-dressed, normal affect, normal respiratory effort. Examination patient has a plantigrade foot the venous stasis ulceration over the medial malleolus has healed.  He does have a plantar ulcer that is 3 cm in diameter.  After informed consent a 10 blade knife was used to debride the skin and soft tissue back to healthy viable granulation tissue.  This left a wound that was 5 cm in diameter 3 mm deep.  Imaging: No results found. No images are attached to the encounter.  Labs: Lab Results  Component Value Date   HGBA1C 6.4 (H) 12/02/2019   ESRSEDRATE 28 (H) 01/18/2021   ESRSEDRATE 40 (H) 12/24/2020   CRP 11.1 (H) 01/18/2021   CRP 1.9 (H) 12/24/2020   REPTSTATUS 12/27/2020 FINAL 12/24/2020   GRAMSTAIN NO WBC SEEN NO ORGANISMS SEEN  12/24/2020   CULT  12/24/2020    FEW STAPHYLOCOCCUS AUREUS WITHIN MIXED ORGANISMS Performed at [facility], [address], [place], [place] [address]    [facility]  STAPHYLOCOCCUS AUREUS 12/24/2020     Lab Results  Component Value Date   ALBUMIN 3.9 12/01/2019   ALBUMIN 3.7 09/26/2017   ALBUMIN 2.3 (L) 08/17/2017    Lab Results  Component Value Date   MG 2.2 08/17/2017   MG 1.8 08/15/2017   No results found for: VD25OH  No results found for: PREALBUMIN CBC EXTENDED Latest Ref Rng & Units 01/18/2021 12/25/2020 12/23/2020  WBC 4.0 - 10.5 K/uL 9.7 6.8 8.2  RBC 4.22 - 5.81 MIL/uL 4.84 4.32 4.83  HGB 13.0 - 17.0 g/dL 41.6 12.4(L) 13.3  HCT 39.0 - 52.0 % 42.1 38.1(L) 42.5  PLT 150 - 400 K/uL 117(L) 118(L) 129(L)  NEUTROABS 1.7 - 7.7 K/uL 8.5(H) - -  LYMPHSABS 0.7 - 4.0 K/uL 0.4(L) - -     There is no height or weight on file to calculate BMI.  Orders:  No orders of the defined types were placed in this encounter.  No orders of the defined types were placed in this encounter.    Procedures: No procedures performed  Clinical Data: No additional findings.  ROS:  All other systems negative, except as noted in the HPI. Review of Systems  Objective: Vital Signs: There were no vitals taken for this visit.  Specialty Comments:  No specialty comments available.  PMFS History: Patient Active  Problem List   Diagnosis Date Noted   Venous insufficiency (chronic) (peripheral)    Ulcer of right foot limited to breakdown of skin (HCC)    Chest pain, rule out acute myocardial infarction 12/23/2020   PAD (peripheral artery disease) (HCC) 12/23/2020   HLD (hyperlipidemia) 12/23/2020   ACS (acute coronary syndrome) (HCC) 12/01/2019   Non-ST elevation (NSTEMI) myocardial infarction (HCC) 12/01/2019   NSTEMI (non-ST elevated myocardial infarction) (HCC) 12/01/2019   History of transmetatarsal amputation of right foot (HCC)    Subacute osteomyelitis, right ankle and foot (HCC)    Infected blister of foot 08/13/2017   Pressure injury of skin 08/12/2017   Weakness 08/11/2017   COPD (chronic obstructive pulmonary disease) (HCC) 08/11/2017    Cellulitis of right lower extremity 08/11/2017   Fall at home, initial encounter 08/11/2017   Thrombocytopenia (HCC) 08/11/2017   Weakness generalized 08/11/2017   Hypokalemia 08/11/2017   Essential hypertension 08/11/2017   Past Medical History:  Diagnosis Date   Abdominal aortic aneurysm    no AAA on [place] in 2020   CAD (coronary artery disease)    Stent to RCA 2021   Chronic obstructive pulmonary disease    HLD (hyperlipidemia)    HTN (hypertension)    Peripheral artery disease    s/p R fem pop // s/p R transmet amp    Family History  Adopted: Yes    Past Surgical History:  Procedure Laterality Date   ABDOMINAL AORTOGRAM N/A 08/17/2017   Procedure: ABDOMINAL AORTOGRAM;  Surgeon: Maeola Harman, MD;  Location: [facility] INVASIVE CV LAB;  Service: Cardiovascular;  Laterality: N/A;   AMPUTATION Right 08/21/2017   Procedure: TRANSMETATARSAL AMPUTATION RIGHT FOOT;  Surgeon: Nadara Mustard, MD;  Location: [facility] OR;  Service: Orthopedics;  Laterality: Right;   AMPUTATION TOE Right 08/18/2017   Procedure: AMPUTATION RIGHT SECOND AND THIRD TOES;  Surgeon: Maeola Harman, MD;  Location: [facility] OR;  Service: Vascular;  Laterality: Right;   CORONARY BALLOON ANGIOPLASTY N/A 12/03/2019   Procedure: CORONARY BALLOON ANGIOPLASTY;  Surgeon: Corky Crafts, MD;  Location: [facility] INVASIVE CV LAB;  Service: Cardiovascular;  Laterality: N/A;   CORONARY STENT INTERVENTION N/A 12/03/2019   Procedure: CORONARY STENT INTERVENTION;  Surgeon: Corky Crafts, MD;  Location: [facility] INVASIVE CV LAB;  Service: Cardiovascular;  Laterality: N/A;   ENDARTERECTOMY FEMORAL Right 08/18/2017   Procedure: ENDARTERECTOMY RIGHT SUPERFICIAL Alfredia Ferguson;  Surgeon: Maeola Harman, MD;  Location: [facility] OR;  Service: Vascular;  Laterality: Right;   FEMORAL-POPLITEAL BYPASS GRAFT Right 08/18/2017   Procedure: BYPASS GRAFT RIGHT COMMON FEMORAL TO ABOVE KNEE POPLITEAL ARTERY USING RIGHT REVERSED GREAT SAPHENOUS  VEIN;  Surgeon: Maeola Harman, MD;  Location: [facility] OR;  Service: Vascular;  Laterality: Right;   LEFT HEART CATH AND CORONARY ANGIOGRAPHY N/A 12/03/2019   Procedure: LEFT HEART CATH AND CORONARY ANGIOGRAPHY;  Surgeon: Corky Crafts, MD;  Location: [facility] INVASIVE CV LAB;  Service: Cardiovascular;  Laterality: N/A;   LOWER EXTREMITY ANGIOGRAPHY Right 08/17/2017   Procedure: Lower Extremity Angiography;  Surgeon: Maeola Harman, MD;  Location: [facility] INVASIVE CV LAB;  Service: Cardiovascular;  Laterality: Right;   PERIPHERAL VASCULAR BALLOON ANGIOPLASTY Right 08/17/2017   Procedure: PERIPHERAL VASCULAR BALLOON ANGIOPLASTY;  Surgeon: Maeola Harman, MD;  Location: [facility] INVASIVE CV LAB;  Service: Cardiovascular;  Laterality: Right;  SFA UNABLE TO CROSS   VEIN HARVEST Right 08/18/2017   Procedure: VEIN HARVEST RIGHT GREAT SAPHENOUS;  Surgeon: Maeola Harman, MD;  Location: [facility] OR;  Service: Vascular;  Laterality: Right;   WOUND DEBRIDEMENT Right 08/18/2017   Procedure: DEBRIDEMENT WOUND RIGHT FOOT;  Surgeon: Maeola Harman, MD;  Location: [facility] OR;  Service: Vascular;  Laterality: Right;   Social History   Occupational History   Not on file  Tobacco Use   Smoking status: Former   Smokeless tobacco: Never  Vaping Use   Vaping Use: Former  Substance and Sexual Activity   Alcohol use: Not Currently   Drug use: No   Sexual activity: Not on file

## 2021-03-31 ENCOUNTER — Ambulatory Visit (INDEPENDENT_AMBULATORY_CARE_PROVIDER_SITE_OTHER)
Admission: RE | Admit: 2021-03-31 | Discharge: 2021-03-31 | Disposition: A | Discharge status: Home / Self Care | Payer: Medicare HMO | Source: Ambulatory Visit | Attending: Physician Assistant | Admitting: Physician Assistant

## 2021-03-31 ENCOUNTER — Ambulatory Visit: Payer: Medicare HMO | Admitting: Physician Assistant

## 2021-03-31 ENCOUNTER — Encounter (HOSPITAL_COMMUNITY): Payer: Medicare HMO

## 2021-03-31 ENCOUNTER — Ambulatory Visit (HOSPITAL_COMMUNITY)
Admission: RE | Admit: 2021-03-31 | Discharge: 2021-03-31 | Disposition: A | Discharge status: Home / Self Care | Payer: Medicare HMO | Source: Ambulatory Visit | Attending: Physician Assistant | Admitting: Physician Assistant

## 2021-03-31 ENCOUNTER — Encounter: Payer: Self-pay | Admitting: Physician Assistant

## 2021-03-31 ENCOUNTER — Other Ambulatory Visit: Payer: Self-pay

## 2021-03-31 ENCOUNTER — Ambulatory Visit: Payer: Medicare HMO

## 2021-03-31 VITALS — BP 152/74 | HR 70 | Temp 98.0°F | Resp 20 | Ht 73.0 in | Wt 280.0 lb

## 2021-03-31 DIAGNOSIS — I739 Peripheral vascular disease, unspecified: Secondary | ICD-10-CM

## 2021-03-31 NOTE — Progress Notes (Signed)
HISTORY AND PHYSICAL     CC:  follow up. Requesting Provider:  Benita Stabile, MD  HPI: This is a 75 y.o. male who is here today for follow up for PAD.  His vascular hx is significant for right femoral to above knee bypass with vein on 08/18/2017 by Dr. Randie Heinz.  He subsequently underwent a TMA on the right by Dr. Lajoyce Corners 08/21/2017.  He was last seen by vascular surgery in September 2022 in the hospital for a right foot ulcer.  On exam, he had a palpable right DP pulse.  He had ABI of 0.83 on the right and his bypass was patent with reasonable velocities.  He did have evidence of inflow stenosis in the common femoral but his velocities are actually decreased from last year.   Given he had an easily palpable femoral and pedal pulse, Dr. Chestine Spore did not feel there was any indication for intervention and recommended short interval follow up.  He is here today for surveillance studies.   The pt returns today for follow up.  He states that he feels his foot has improved and shows me a picture from before.  He states that Dr. Lajoyce Corners is following his foot wounds.  He states that he sees him on Thursday.  He has been wearing his compression.  He states he gets occasional sharp pains in his foot but does not have rest pain.    The pt is on a statin for cholesterol management.    The pt is on an aspirin.    Other AC:  none The pt is on ARB, BB for hypertension.  The pt odes not have diabetes. Tobacco hx:  former  Pt does not have family hx of AAA.  Past Medical History:  Diagnosis Date   Abdominal aortic aneurysm    no AAA on [place] in 2020   CAD (coronary artery disease)    Stent to RCA 2021   Chronic obstructive pulmonary disease    HLD (hyperlipidemia)    HTN (hypertension)    Peripheral artery disease    s/p R fem pop // s/p R transmet amp    Past Surgical History:  Procedure Laterality Date   ABDOMINAL AORTOGRAM N/A 08/17/2017   Procedure: ABDOMINAL AORTOGRAM;  Surgeon: Maeola Harman, MD;   Location: [facility] INVASIVE CV LAB;  Service: Cardiovascular;  Laterality: N/A;   AMPUTATION Right 08/21/2017   Procedure: TRANSMETATARSAL AMPUTATION RIGHT FOOT;  Surgeon: Nadara Mustard, MD;  Location: [facility] OR;  Service: Orthopedics;  Laterality: Right;   AMPUTATION TOE Right 08/18/2017   Procedure: AMPUTATION RIGHT SECOND AND THIRD TOES;  Surgeon: Maeola Harman, MD;  Location: [facility] OR;  Service: Vascular;  Laterality: Right;   CORONARY BALLOON ANGIOPLASTY N/A 12/03/2019   Procedure: CORONARY BALLOON ANGIOPLASTY;  Surgeon: Corky Crafts, MD;  Location: [facility] INVASIVE CV LAB;  Service: Cardiovascular;  Laterality: N/A;   CORONARY STENT INTERVENTION N/A 12/03/2019   Procedure: CORONARY STENT INTERVENTION;  Surgeon: Corky Crafts, MD;  Location: [facility] INVASIVE CV LAB;  Service: Cardiovascular;  Laterality: N/A;   ENDARTERECTOMY FEMORAL Right 08/18/2017   Procedure: ENDARTERECTOMY RIGHT SUPERFICIAL Alfredia Ferguson;  Surgeon: Maeola Harman, MD;  Location: [facility] OR;  Service: Vascular;  Laterality: Right;   FEMORAL-POPLITEAL BYPASS GRAFT Right 08/18/2017   Procedure: BYPASS GRAFT RIGHT COMMON FEMORAL TO ABOVE KNEE POPLITEAL ARTERY USING RIGHT REVERSED GREAT SAPHENOUS VEIN;  Surgeon: Maeola Harman, MD;  Location: [facility] OR;  Service: Vascular;  Laterality: Right;  LEFT HEART CATH AND CORONARY ANGIOGRAPHY N/A 12/03/2019   Procedure: LEFT HEART CATH AND CORONARY ANGIOGRAPHY;  Surgeon: Corky Crafts, MD;  Location: [facility] INVASIVE CV LAB;  Service: Cardiovascular;  Laterality: N/A;   LOWER EXTREMITY ANGIOGRAPHY Right 08/17/2017   Procedure: Lower Extremity Angiography;  Surgeon: Maeola Harman, MD;  Location: [facility] INVASIVE CV LAB;  Service: Cardiovascular;  Laterality: Right;   PERIPHERAL VASCULAR BALLOON ANGIOPLASTY Right 08/17/2017   Procedure: PERIPHERAL VASCULAR BALLOON ANGIOPLASTY;  Surgeon: Maeola Harman, MD;  Location: [facility] INVASIVE CV LAB;  Service:  Cardiovascular;  Laterality: Right;  SFA UNABLE TO CROSS   VEIN HARVEST Right 08/18/2017   Procedure: VEIN HARVEST RIGHT GREAT SAPHENOUS;  Surgeon: Maeola Harman, MD;  Location: [facility] OR;  Service: Vascular;  Laterality: Right;   WOUND DEBRIDEMENT Right 08/18/2017   Procedure: DEBRIDEMENT WOUND RIGHT FOOT;  Surgeon: Maeola Harman, MD;  Location: [facility] OR;  Service: Vascular;  Laterality: Right;    Allergies  Allergen Reactions   Penicillins Hives and Rash    Childhood reaction Has patient had a PCN reaction causing immediate rash, facial/tongue/throat swelling, SOB or lightheadedness with hypotension: No PT DEVELOPED ## SEVERE ## RASH INVOLVING MUCUS MEMBRANES or SKIN NECROSIS: #  #  YES  #  # Has patient had a PCN reaction that required hospitalization: Unknown Has patient had a PCN reaction occurring within the last 10 years: No   Clindamycin Rash    Diffuse rash across body    Current Outpatient Medications  Medication Sig Dispense Refill   Albuterol Sulfate 108 (90 Base) MCG/ACT AEPB Inhale 90 mcg into the lungs.     aspirin 81 MG chewable tablet Chew 1 tablet (81 mg total) by mouth daily.     BREZTRI AEROSPHERE 160-9-4.8 MCG/ACT AERO Inhale 2 puffs into the lungs 2 (two) times daily.     losartan (COZAAR) 50 MG tablet Take 1 tablet by mouth once daily 90 tablet 3   metoprolol tartrate (LOPRESSOR) 25 MG tablet Take 1 tablet by mouth twice daily 180 tablet 1   Multiple Vitamins-Minerals (CENTRUM SILVER 50+MEN PO) Take by mouth.     nitroGLYCERIN (NITROSTAT) 0.4 MG SL tablet Place 1 tablet (0.4 mg total) under the tongue every 5 (five) minutes as needed for chest pain. 25 tablet 3   rosuvastatin (CRESTOR) 40 MG tablet Take 1 tablet by mouth once daily 180 tablet 2   No current facility-administered medications for this visit.    Family History  Adopted: Yes    Social History   Socioeconomic History   Marital status: Divorced    Spouse name: Not on file    Number of children: Not on file   Years of education: Not on file   Highest education level: Not on file  Occupational History   Not on file  Tobacco Use   Smoking status: Former   Smokeless tobacco: Never  Vaping Use   Vaping Use: Former  Substance and Sexual Activity   Alcohol use: Not Currently   Drug use: No   Sexual activity: Not on file  Other Topics Concern   Not on file  Social History Narrative   Not on file   Social Determinants of Health   Financial Resource Strain: Not on file  Food Insecurity: Not on file  Transportation Needs: Not on file  Physical Activity: Not on file  Stress: Not on file  Social Connections: Not on file  Intimate Partner Violence: Not on file  REVIEW OF SYSTEMS:   [X]  denotes positive finding, [ ]  denotes negative finding Cardiac  Comments:  Chest pain or chest pressure:    Shortness of breath upon exertion:    Short of breath when lying flat:    Irregular heart rhythm:        Vascular    Pain in calf, thigh, or hip brought on by ambulation:    Pain in feet at night that wakes you up from your sleep:     Blood clot in your veins:    Leg swelling:         Pulmonary    Oxygen at home:    Productive cough:     Wheezing:         Neurologic    Sudden weakness in arms or legs:     Sudden numbness in arms or legs:     Sudden onset of difficulty speaking or slurred speech:    Temporary loss of vision in one eye:     Problems with dizziness:         Gastrointestinal    Blood in stool:     Vomited blood:         Genitourinary    Burning when urinating:     Blood in urine:        Psychiatric    Major depression:         Hematologic    Bleeding problems:    Problems with blood clotting too easily:        Skin    Rashes or ulcers:        Constitutional    Fever or chills:      PHYSICAL EXAMINATION:  Today's Vitals   03/31/21 1446  BP: (!) 152/74  Pulse: 70  Resp: 20  Temp: 98 F (36.7 C)  TempSrc: Oral   SpO2: 94%  Weight: 280 lb (127 kg)  Height: 6\' 1"  (1.854 m)   Body mass index is 36.94 kg/m.   General:  WDWN in NAD; vital signs documented above Gait: Not observed HENT: WNL, normocephalic Pulmonary: normal non-labored breathing , without wheezing Cardiac: regular HR, without carotid bruits Abdomen: soft, NT, no masses; aortic pulse is not palpable Skin: without rashes Vascular Exam/Pulses:  Right Left  Radial 2+ (normal) 2+ (normal)  Femoral 2+ (normal) Difficult to palpate due to body habitus  AT Brisk monophasic Faint monophasic  PT Brisk monophasic proximal to ankle monophasic  Peroneal Brisk monophasic monophasic   Extremities:      Musculoskeletal: no muscle wasting or atrophy  Neurologic: A&O X 3 Psychiatric:  The pt has Normal affect.   Non-Invasive Vascular Imaging:   ABI's/TBI's on 03/31/2021: Right:  0.82 - Great toe pressure: amp Left:  0.64/0.39 - Great toe pressure: 63  Arterial duplex on 03/31/2021: Right Graft #1: Femoral-AK popliteal  +-----------+--------+-----+---------------+----------+-------------------+   RIGHT       PSV cm/s Ratio Stenosis        Waveform   Comments            +-----------+--------+-----+---------------+----------+-------------------+   CFA Distal  353            50-74% stenosis                                +-----------+--------+-----+---------------+----------+-------------------+   ATA Distal  22  monophasic                     +-----------+--------+-----+---------------+----------+-------------------+   PTA Distal  83                             monophasic                     +-----------+--------+-----+---------------+----------+-------------------+   PERO Distal                                           unable to visualize   +-----------+--------+-----+---------------+----------+-------------------+    +------------------+--------+---------------+----------+--------+                       PSV cm/s Stenosis        Waveform   Comments   +------------------+--------+---------------+----------+--------+   Inflow             353      50-74% stenosis monophasic            +------------------+--------+---------------+----------+--------+   Prox Anastomosis   138                      monophasic            +------------------+--------+---------------+----------+--------+   Proximal Graft     83                       monophasic            +------------------+--------+---------------+----------+--------+   Mid Graft          60                       monophasic            +------------------+--------+---------------+----------+--------+   Distal Graft       68                       monophasic            +------------------+--------+---------------+----------+--------+   Distal Anastomosis 74                       monophasic            +------------------+--------+---------------+----------+--------+   Outflow            139                      monophasic            +------------------+--------+---------------+----------+--------+   Summary:  Right: Patent femoral to above-knee popliteal bypass graft with 50-74%  stenosis in the inflow artery (common femoral artery).   Previous ABI's/TBI's on 12/25/2020: Right:  0.83 - Great toe pressure: amp Left:  0.61/0.51 - Great toe pressure:  84  Previous arterial duplex on 12/25/2020: +----------+--------+-----+---------------+----------+--------+   RIGHT      PSV cm/s Ratio Stenosis        Waveform   Comments   +----------+--------+-----+---------------+----------+--------+   CFA Prox   359            50-74% stenosis                       +----------+--------+-----+---------------+----------+--------+  POP Prox   140                            monophasic            +----------+--------+-----+---------------+----------+--------+   POP Mid    117                            monophasic             +----------+--------+-----+---------------+----------+--------+   POP Distal 108                            monophasic            +----------+--------+-----+---------------+----------+--------+   ATA Prox   85                             monophasic            +----------+--------+-----+---------------+----------+--------+   ATA Mid    87                             monophasic            +----------+--------+-----+---------------+----------+--------+   ATA Distal 100                            monophasic            +----------+--------+-----+---------------+----------+--------+   PTA Prox   85                             monophasic            +----------+--------+-----+---------------+----------+--------+   PTA Mid    58                             monophasic            +----------+--------+-----+---------------+----------+--------+   Right Graft #1:  +------------------+--------+---------------+----------+--------+                      PSV cm/s Stenosis        Waveform   Comments   +------------------+--------+---------------+----------+--------+   Inflow             359      50-74% stenosis                       +------------------+--------+---------------+----------+--------+   Prox Anastomosis   79                       monophasic            +------------------+--------+---------------+----------+--------+   Proximal Graft     64                       monophasic            +------------------+--------+---------------+----------+--------+   Mid Graft          71                       monophasic            +------------------+--------+---------------+----------+--------+  Distal Graft       67                       monophasic            +------------------+--------+---------------+----------+--------+   Distal Anastomosis 89                       monophasic            +------------------+--------+---------------+----------+--------+   Outflow            186                       monophasic            +------------------+--------+---------------+----------+--------+   Summary:  Right: 50-74% stenosis noted in the common femoral artery. Patent femoral  to above knee bypass. 50-74% CFA stenosis.   ASSESSMENT/PLAN:: 75 y.o. male here for follow up for PAD with hx of right femoral to above knee bypass with vein on 08/18/2017 by Dr. Randie Heinz.  He subsequently underwent a TMA on the right by Dr. Lajoyce Corners 08/21/2017.  -pt has brisk monophasic doppler flow in right foot and velocities at proximal anastomosis are essentially unchanged.  He states his foot is slowly getting better.  Will continue short interval surveillance and he will f/u in 3 months with ABI and RLE duplex.  Discussed results and pt with Dr. Chestine Spore and he is in agreement.    -pt will call sooner if he has worsening wounds or rest pain.  He has f/u with Dr. Lajoyce Corners on 04/02/2021 -continue statin/asa    Doreatha Massed, [facility] [phone]  Clinic MD:   Chestine Spore

## 2021-04-01 ENCOUNTER — Other Ambulatory Visit: Payer: Self-pay

## 2021-04-01 DIAGNOSIS — I739 Peripheral vascular disease, unspecified: Secondary | ICD-10-CM

## 2021-04-02 ENCOUNTER — Other Ambulatory Visit: Payer: Self-pay

## 2021-04-02 ENCOUNTER — Ambulatory Visit (INDEPENDENT_AMBULATORY_CARE_PROVIDER_SITE_OTHER): Payer: Medicare HMO | Admitting: Orthopedic Surgery

## 2021-04-02 DIAGNOSIS — L97511 Non-pressure chronic ulcer of other part of right foot limited to breakdown of skin: Secondary | ICD-10-CM

## 2021-04-02 DIAGNOSIS — Z89431 Acquired absence of right foot: Secondary | ICD-10-CM

## 2021-04-03 ENCOUNTER — Encounter: Payer: Self-pay | Admitting: Orthopedic Surgery

## 2021-04-03 NOTE — Progress Notes (Signed)
Office Visit Note   Patient: Jorge Clements           Date of Birth: [birth year]           MRN: [id number] Visit Date: 04/02/2021              Requested by: Benita Stabile, MD [address] [place],  [place] [address] PCP: Benita Stabile, MD  Chief Complaint  Patient presents with   Right Foot - Wound Check, Follow-up      HPI: Patient is a 75 year old gentleman who is status post a right transmetatarsal amputation who has recurrent plantar ulcers on the right foot.  Assessment & Plan: Visit Diagnoses:  1. Non-pressure chronic ulcer of other part of right foot limited to breakdown of skin (HCC)   2. History of transmetatarsal amputation of right foot (HCC)     Plan: Ulcer was debrided of skin and soft tissue he will continue with protected weightbearing.  Follow-Up Instructions: Return in about 4 weeks (around 04/30/2021).   Ortho Exam  Patient is alert, oriented, no adenopathy, well-dressed, normal affect, normal respiratory effort. Examination patient is currently wearing a medical compression stocking.  He has an ulcer on the plantar aspect of the right foot approximately 4 cm in diameter.  After informed consent a 10 blade knife was used to debride the skin and soft tissue back to healthy viable tissue.  After debridement the ulcer is 5 cm in diameter 1 mm deep.  Imaging: No results found. No images are attached to the encounter.  Labs: Lab Results  Component Value Date   HGBA1C 6.4 (H) 12/02/2019   ESRSEDRATE 28 (H) 01/18/2021   ESRSEDRATE 40 (H) 12/24/2020   CRP 11.1 (H) 01/18/2021   CRP 1.9 (H) 12/24/2020   REPTSTATUS 12/27/2020 FINAL 12/24/2020   GRAMSTAIN NO WBC SEEN NO ORGANISMS SEEN  12/24/2020   CULT  12/24/2020    FEW STAPHYLOCOCCUS AUREUS WITHIN MIXED ORGANISMS Performed at [facility], [address], [place], [place] [address]    [facility] STAPHYLOCOCCUS AUREUS 12/24/2020     Lab Results  Component Value Date   ALBUMIN 3.9 12/01/2019    ALBUMIN 3.7 09/26/2017   ALBUMIN 2.3 (L) 08/17/2017    Lab Results  Component Value Date   MG 2.2 08/17/2017   MG 1.8 08/15/2017   No results found for: VD25OH  No results found for: PREALBUMIN CBC EXTENDED Latest Ref Rng & Units 01/18/2021 12/25/2020 12/23/2020  WBC 4.0 - 10.5 K/uL 9.7 6.8 8.2  RBC 4.22 - 5.81 MIL/uL 4.84 4.32 4.83  HGB 13.0 - 17.0 g/dL 24.2 12.4(L) 13.3  HCT 39.0 - 52.0 % 42.1 38.1(L) 42.5  PLT 150 - 400 K/uL 117(L) 118(L) 129(L)  NEUTROABS 1.7 - 7.7 K/uL 8.5(H) - -  LYMPHSABS 0.7 - 4.0 K/uL 0.4(L) - -     There is no height or weight on file to calculate BMI.  Orders:  No orders of the defined types were placed in this encounter.  No orders of the defined types were placed in this encounter.    Procedures: No procedures performed  Clinical Data: No additional findings.  ROS:  All other systems negative, except as noted in the HPI. Review of Systems  Objective: Vital Signs: There were no vitals taken for this visit.  Specialty Comments:  No specialty comments available.  PMFS History: Patient Active Problem List   Diagnosis Date Noted   Venous insufficiency (chronic) (peripheral)  Ulcer of right foot limited to breakdown of skin (HCC)    Chest pain, rule out acute myocardial infarction 12/23/2020   PAD (peripheral artery disease) (HCC) 12/23/2020   HLD (hyperlipidemia) 12/23/2020   ACS (acute coronary syndrome) (HCC) 12/01/2019   Non-ST elevation (NSTEMI) myocardial infarction (HCC) 12/01/2019   NSTEMI (non-ST elevated myocardial infarction) (HCC) 12/01/2019   History of transmetatarsal amputation of right foot (HCC)    Subacute osteomyelitis, right ankle and foot (HCC)    Infected blister of foot 08/13/2017   Pressure injury of skin 08/12/2017   Weakness 08/11/2017   COPD (chronic obstructive pulmonary disease) (HCC) 08/11/2017   Cellulitis of right lower extremity 08/11/2017   Fall at home, initial encounter 08/11/2017    Thrombocytopenia (HCC) 08/11/2017   Weakness generalized 08/11/2017   Hypokalemia 08/11/2017   Essential hypertension 08/11/2017   Past Medical History:  Diagnosis Date   Abdominal aortic aneurysm    no AAA on [place] in 2020   CAD (coronary artery disease)    Stent to RCA 2021   Chronic obstructive pulmonary disease    HLD (hyperlipidemia)    HTN (hypertension)    Peripheral artery disease    s/p R fem pop // s/p R transmet amp    Family History  Adopted: Yes    Past Surgical History:  Procedure Laterality Date   ABDOMINAL AORTOGRAM N/A 08/17/2017   Procedure: ABDOMINAL AORTOGRAM;  Surgeon: Maeola Harman, MD;  Location: [facility] INVASIVE CV LAB;  Service: Cardiovascular;  Laterality: N/A;   AMPUTATION Right 08/21/2017   Procedure: TRANSMETATARSAL AMPUTATION RIGHT FOOT;  Surgeon: Nadara Mustard, MD;  Location: [facility] OR;  Service: Orthopedics;  Laterality: Right;   AMPUTATION TOE Right 08/18/2017   Procedure: AMPUTATION RIGHT SECOND AND THIRD TOES;  Surgeon: Maeola Harman, MD;  Location: [facility] OR;  Service: Vascular;  Laterality: Right;   CORONARY BALLOON ANGIOPLASTY N/A 12/03/2019   Procedure: CORONARY BALLOON ANGIOPLASTY;  Surgeon: Corky Crafts, MD;  Location: [facility] INVASIVE CV LAB;  Service: Cardiovascular;  Laterality: N/A;   CORONARY STENT INTERVENTION N/A 12/03/2019   Procedure: CORONARY STENT INTERVENTION;  Surgeon: Corky Crafts, MD;  Location: [facility] INVASIVE CV LAB;  Service: Cardiovascular;  Laterality: N/A;   ENDARTERECTOMY FEMORAL Right 08/18/2017   Procedure: ENDARTERECTOMY RIGHT SUPERFICIAL Alfredia Ferguson;  Surgeon: Maeola Harman, MD;  Location: [facility] OR;  Service: Vascular;  Laterality: Right;   FEMORAL-POPLITEAL BYPASS GRAFT Right 08/18/2017   Procedure: BYPASS GRAFT RIGHT COMMON FEMORAL TO ABOVE KNEE POPLITEAL ARTERY USING RIGHT REVERSED GREAT SAPHENOUS VEIN;  Surgeon: Maeola Harman, MD;  Location: [facility] OR;  Service: Vascular;  Laterality:  Right;   LEFT HEART CATH AND CORONARY ANGIOGRAPHY N/A 12/03/2019   Procedure: LEFT HEART CATH AND CORONARY ANGIOGRAPHY;  Surgeon: Corky Crafts, MD;  Location: [facility] INVASIVE CV LAB;  Service: Cardiovascular;  Laterality: N/A;   LOWER EXTREMITY ANGIOGRAPHY Right 08/17/2017   Procedure: Lower Extremity Angiography;  Surgeon: Maeola Harman, MD;  Location: [facility] INVASIVE CV LAB;  Service: Cardiovascular;  Laterality: Right;   PERIPHERAL VASCULAR BALLOON ANGIOPLASTY Right 08/17/2017   Procedure: PERIPHERAL VASCULAR BALLOON ANGIOPLASTY;  Surgeon: Maeola Harman, MD;  Location: [facility] INVASIVE CV LAB;  Service: Cardiovascular;  Laterality: Right;  SFA UNABLE TO CROSS   VEIN HARVEST Right 08/18/2017   Procedure: VEIN HARVEST RIGHT GREAT SAPHENOUS;  Surgeon: Maeola Harman, MD;  Location: [facility] OR;  Service: Vascular;  Laterality: Right;   WOUND DEBRIDEMENT Right 08/18/2017   Procedure: DEBRIDEMENT WOUND  RIGHT FOOT;  Surgeon: Maeola Harman, MD;  Location: [facility] OR;  Service: Vascular;  Laterality: Right;   Social History   Occupational History   Not on file  Tobacco Use   Smoking status: Former   Smokeless tobacco: Never  Vaping Use   Vaping Use: Former  Substance and Sexual Activity   Alcohol use: Not Currently   Drug use: No   Sexual activity: Not on file

## 2021-04-04 ENCOUNTER — Other Ambulatory Visit: Payer: Self-pay | Admitting: Cardiology

## 2021-04-06 ENCOUNTER — Ambulatory Visit: Payer: Medicare HMO | Admitting: Cardiovascular Disease

## 2021-04-13 ENCOUNTER — Other Ambulatory Visit: Payer: Self-pay

## 2021-04-13 ENCOUNTER — Emergency Department (HOSPITAL_COMMUNITY): Payer: Medicare HMO

## 2021-04-13 ENCOUNTER — Observation Stay (HOSPITAL_COMMUNITY)
Admission: EM | Admit: 2021-04-13 | Discharge: 2021-04-15 | Disposition: A | Discharge status: Home / Self Care | Payer: Medicare HMO | Attending: Internal Medicine | Admitting: Internal Medicine

## 2021-04-13 ENCOUNTER — Encounter (HOSPITAL_COMMUNITY): Payer: Self-pay | Admitting: *Deleted

## 2021-04-13 DIAGNOSIS — I25110 Atherosclerotic heart disease of native coronary artery with unstable angina pectoris: Principal | ICD-10-CM | POA: Insufficient documentation

## 2021-04-13 DIAGNOSIS — I10 Essential (primary) hypertension: Secondary | ICD-10-CM | POA: Diagnosis not present

## 2021-04-13 DIAGNOSIS — I214 Non-ST elevation (NSTEMI) myocardial infarction: Secondary | ICD-10-CM

## 2021-04-13 DIAGNOSIS — Z955 Presence of coronary angioplasty implant and graft: Secondary | ICD-10-CM | POA: Diagnosis not present

## 2021-04-13 DIAGNOSIS — D696 Thrombocytopenia, unspecified: Secondary | ICD-10-CM | POA: Diagnosis not present

## 2021-04-13 DIAGNOSIS — J449 Chronic obstructive pulmonary disease, unspecified: Secondary | ICD-10-CM | POA: Insufficient documentation

## 2021-04-13 DIAGNOSIS — R079 Chest pain, unspecified: Secondary | ICD-10-CM | POA: Diagnosis present

## 2021-04-13 DIAGNOSIS — Z87891 Personal history of nicotine dependence: Secondary | ICD-10-CM | POA: Insufficient documentation

## 2021-04-13 DIAGNOSIS — Z79899 Other long term (current) drug therapy: Secondary | ICD-10-CM | POA: Insufficient documentation

## 2021-04-13 DIAGNOSIS — Z20822 Contact with and (suspected) exposure to covid-19: Secondary | ICD-10-CM | POA: Diagnosis not present

## 2021-04-13 DIAGNOSIS — E785 Hyperlipidemia, unspecified: Secondary | ICD-10-CM | POA: Diagnosis present

## 2021-04-13 DIAGNOSIS — I739 Peripheral vascular disease, unspecified: Secondary | ICD-10-CM | POA: Diagnosis present

## 2021-04-13 DIAGNOSIS — E871 Hypo-osmolality and hyponatremia: Secondary | ICD-10-CM

## 2021-04-13 DIAGNOSIS — R0789 Other chest pain: Secondary | ICD-10-CM | POA: Diagnosis not present

## 2021-04-13 DIAGNOSIS — I2510 Atherosclerotic heart disease of native coronary artery without angina pectoris: Secondary | ICD-10-CM | POA: Diagnosis present

## 2021-04-13 LAB — CBC
HCT: 42.2 % (ref 39.0–52.0)
Hemoglobin: 13.2 g/dL (ref 13.0–17.0)
MCH: 27.7 pg (ref 26.0–34.0)
MCHC: 31.3 g/dL (ref 30.0–36.0)
MCV: 88.7 fL (ref 80.0–100.0)
Platelets: 113 10*3/uL — ABNORMAL LOW (ref 150–400)
RBC: 4.76 MIL/uL (ref 4.22–5.81)
RDW: 16 % — ABNORMAL HIGH (ref 11.5–15.5)
WBC: 7.9 10*3/uL (ref 4.0–10.5)
nRBC: 0 % (ref 0.0–0.2)

## 2021-04-13 LAB — BASIC METABOLIC PANEL
Anion gap: 7 (ref 5–15)
BUN: 15 mg/dL (ref 8–23)
CO2: 27 mmol/L (ref 22–32)
Calcium: 9.2 mg/dL (ref 8.9–10.3)
Chloride: 100 mmol/L (ref 98–111)
Creatinine, Ser: 1.1 mg/dL (ref 0.61–1.24)
GFR, Estimated: >60 mL/min (ref >60)
Glucose, Bld: 105 mg/dL — ABNORMAL HIGH (ref 70–99)
Potassium: 3.6 mmol/L (ref 3.5–5.1)
Sodium: 134 mmol/L — ABNORMAL LOW (ref 135–145)

## 2021-04-13 LAB — TROPONIN I (HIGH SENSITIVITY)
Troponin I (High Sensitivity): 17 ng/L (ref <18)
Troponin I (High Sensitivity): 61 ng/L — ABNORMAL HIGH (ref <18)

## 2021-04-13 MED ORDER — ASPIRIN 81 MG PO CHEW
324.0000 mg | CHEWABLE_TABLET | Freq: Once | ORAL | Status: AC
  Administered 2021-04-13: 324 mg via ORAL
  Filled 2021-04-13: qty 4

## 2021-04-13 NOTE — ED Provider Notes (Signed)
Emergency Medicine Provider Triage Evaluation Note  Jorge Clements , a 75 y.o. male  was evaluated in triage.  Pt complains of chest pain since 3pm today. Pain feels like pounding. Some lightheadedness, left hand feels numb. Felt hot. No nausea, vomiting. Shob but this is at baseline, 2/2 COPD. Some relief with nitro. Hx of ACS.  Review of Systems  Positive: Chest pain, shob, numbness Negative: Nausea, vomiting  Physical Exam  BP (!) 175/75 (BP Location: Left Arm)    Pulse 87    Temp 98 F (36.7 C) (Oral)    Resp 19    SpO2 97%  Gen:   Awake, no distress   Resp:  Normal effort  MSK:   Moves extremities without difficulty  Other:  No ttp Chest wall  Medical Decision Making  Medically screening exam initiated at 6:06 PM.  Appropriate orders placed.  [name] was informed that the remainder of the evaluation will be completed by another provider, this initial triage assessment does not replace that evaluation, and the importance of remaining in the ED until their evaluation is complete.  ACS workup initiated   [place] 04/13/21 Rayfield Citizen, MD 04/13/21 [phone]

## 2021-04-13 NOTE — ED Triage Notes (Signed)
Pt has cardiac hx. Reports onset 3pm of chest pain and feeling lightheaded. Pt took 2 nitro which helped relieve his pain but still feels very weak and lightheaded.

## 2021-04-13 NOTE — ED Provider Notes (Signed)
[facility] EMERGENCY DEPARTMENT Provider Note   CSN: [id number] Arrival date & time: 04/13/21  1700     History Chief Complaint  Patient presents with   Chest Pain   Dizziness    Jorge Clements is a 75 y.o. male.  The history is provided by the patient.  Chest Pain Associated symptoms: dizziness   Dizziness Associated symptoms: chest pain   He has history of hypertension, hyperlipidemia, COPD, coronary artery disease status post coronary stent placement and comes in because of an episode of chest pain at about 3 PM.  Pain came on at rest and he described a tight feeling in his chest with associated fluttering in his chest.  There is no change in his baseline dyspnea and he denied diaphoresis or nausea.  Pain did resolve with nitroglycerin x2.  Pain was rated at 2/10.  He also had some dizziness associated with it.    Past Medical History:  Diagnosis Date   Abdominal aortic aneurysm    no AAA on [place] in 2020   CAD (coronary artery disease)    Stent to RCA 2021   Chronic obstructive pulmonary disease    HLD (hyperlipidemia)    HTN (hypertension)    Peripheral artery disease    s/p R fem pop // s/p R transmet amp    Patient Active Problem List   Diagnosis Date Noted   Venous insufficiency (chronic) (peripheral)    Ulcer of right foot limited to breakdown of skin (HCC)    Chest pain, rule out acute myocardial infarction 12/23/2020   PAD (peripheral artery disease) (HCC) 12/23/2020   HLD (hyperlipidemia) 12/23/2020   ACS (acute coronary syndrome) (HCC) 12/01/2019   Non-ST elevation (NSTEMI) myocardial infarction (HCC) 12/01/2019   NSTEMI (non-ST elevated myocardial infarction) (HCC) 12/01/2019   History of transmetatarsal amputation of right foot (HCC)    Subacute osteomyelitis, right ankle and foot (HCC)    Infected blister of foot 08/13/2017   Pressure injury of skin 08/12/2017   Weakness 08/11/2017   COPD (chronic obstructive pulmonary disease)  (HCC) 08/11/2017   Cellulitis of right lower extremity 08/11/2017   Fall at home, initial encounter 08/11/2017   Thrombocytopenia (HCC) 08/11/2017   Weakness generalized 08/11/2017   Hypokalemia 08/11/2017   Essential hypertension 08/11/2017    Past Surgical History:  Procedure Laterality Date   ABDOMINAL AORTOGRAM N/A 08/17/2017   Procedure: ABDOMINAL AORTOGRAM;  Surgeon: Maeola Harman, MD;  Location: [facility] INVASIVE CV LAB;  Service: Cardiovascular;  Laterality: N/A;   AMPUTATION Right 08/21/2017   Procedure: TRANSMETATARSAL AMPUTATION RIGHT FOOT;  Surgeon: Nadara Mustard, MD;  Location: [facility] OR;  Service: Orthopedics;  Laterality: Right;   AMPUTATION TOE Right 08/18/2017   Procedure: AMPUTATION RIGHT SECOND AND THIRD TOES;  Surgeon: Maeola Harman, MD;  Location: [facility] OR;  Service: Vascular;  Laterality: Right;   CORONARY BALLOON ANGIOPLASTY N/A 12/03/2019   Procedure: CORONARY BALLOON ANGIOPLASTY;  Surgeon: Corky Crafts, MD;  Location: [facility] INVASIVE CV LAB;  Service: Cardiovascular;  Laterality: N/A;   CORONARY STENT INTERVENTION N/A 12/03/2019   Procedure: CORONARY STENT INTERVENTION;  Surgeon: Corky Crafts, MD;  Location[facility] INVAS[facility] CV LAB;  Service: Cardiovascular;  Laterality: N/A;   ENDARTERECTOMY FEMORAL Right 08/18/2017   Procedure: ENDARTERECTOMY RIGHT SUPERFICIAL Alfredia Ferguson;  Surgeon: Maeola Harman, MD;  Location: [facility] OR;  Service: Vascular;  Laterality: Right;   FEMORAL-POPLITEAL BYPASS GRAFT Right 08/18/2017   Procedure: BYPASS GRAFT RIGHT COMMON FEMORAL TO ABOVE KNEE POPLITEAL  ARTERY USING RIGHT REVERSED GREAT SAPHENOUS VEIN;  Surgeon: Maeola Harman, MD;  Location: [facility] OR;  Service: Vascular;  Laterality: Right;   LEFT HEART CATH AND CORONARY ANGIOGRAPHY N/A 12/03/2019   Procedure: LEFT HEART CATH AND CORONARY ANGIOGRAPHY;  Surgeon: Corky Crafts, MD;  Location: [facility] INVASIVE CV LAB;  Service: Cardiovascular;  Laterality:  N/A;   LOWER EXTREMITY ANGIOGRAPHY Right 08/17/2017   Procedure: Lower Extremity Angiography;  Surgeon: Maeola Harman, MD;  Location: [facility] INVASIVE CV LAB;  Service: Cardiovascular;  Laterality: Right;   PERIPHERAL VASCULAR BALLOON ANGIOPLASTY Right 08/17/2017   Procedure: PERIPHERAL VASCULAR BALLOON ANGIOPLASTY;  Surgeon: Maeola Harman, MD;  Location: [facility] INVASIVE CV LAB;  Service: Cardiovascular;  Laterality: Right;  SFA UNABLE TO CROSS   VEIN HARVEST Right 08/18/2017   Procedure: VEIN HARVEST RIGHT GREAT SAPHENOUS;  Surgeon: Maeola Harman, MD;  Location: [facility] OR;  Service: Vascular;  Laterality: Right;   WOUND DEBRIDEMENT Right 08/18/2017   Procedure: DEBRIDEMENT WOUND RIGHT FOOT;  Surgeon: Maeola Harman, MD;  Location: [facility] OR;  Service: Vascular;  Laterality: Right;       Family History  Adopted: Yes    Social History   Tobacco Use   Smoking status: Former   Smokeless tobacco: Never  Vaping Use   Vaping Use: Former  Substance Use Topics   Alcohol use: Not Currently   Drug use: No    Home Medications Prior to Admission medications   Medication Sig Start Date End Date Taking? Authorizing Provider  metoprolol tartrate (LOPRESSOR) 25 MG tablet Take 1 tablet by mouth twice daily 04/06/21   Wendall Stade, MD  Albuterol Sulfate 108 (90 Base) MCG/ACT AEPB Inhale 90 mcg into the lungs.    [provider]  aspirin 81 MG chewable tablet Chew 1 tablet (81 mg total) by mouth daily. 12/04/19   Filbert Schilder, NP  BREZTRI AEROSPHERE 160-9-4.8 MCG/ACT AERO Inhale 2 puffs into the lungs 2 (two) times daily. 07/09/19   [provider]  losartan (COZAAR) 50 MG tablet Take 1 tablet by mouth once daily 12/08/20   Netta Neat., NP  Multiple Vitamins-Minerals (CENTRUM SILVER 50+MEN PO) Take by mouth.    [provider]  nitroGLYCERIN (NITROSTAT) 0.4 MG SL tablet Place 1 tablet (0.4 mg total) under the tongue every 5 (five)  minutes as needed for chest pain. 12/13/19 03/18/21  Netta Neat., NP  rosuvastatin (CRESTOR) 40 MG tablet Take 1 tablet by mouth once daily 07/16/20   Georgie Chard D, NP    Allergies    Penicillins and Clindamycin  Review of Systems   Review of Systems  Cardiovascular:  Positive for chest pain.  Neurological:  Positive for dizziness.  All other systems reviewed and are negative.  Physical Exam Updated Vital Signs BP (!) 145/70    Pulse 70    Temp 97.9 F (36.6 C)    Resp 15    SpO2 98%   Physical Exam Vitals and nursing note reviewed.  75 year old male, resting comfortably and in no acute distress. Vital signs are significant for mild elevation of systolic blood pressure. Oxygen saturation is 98%, which is normal. Head is normocephalic and atraumatic. PERRLA, EOMI. Oropharynx is clear. Neck is nontender and supple without adenopathy or JVD. Back is nontender and there is no CVA tenderness. Lungs are clear without rales, wheezes, or rhonchi. Chest is nontender. Heart has regular rate and rhythm without murmur. Abdomen is soft, flat,  nontender. Extremities have 1+ edema, full range of motion is present. Skin is warm and dry without rash. Neurologic: Mental status is normal, cranial nerves are intact, t moves all extremities equally.  ED Results / Procedures / Treatments   Labs (all labs ordered are listed, but only abnormal results are displayed) Labs Reviewed  BASIC METABOLIC PANEL - Abnormal; Notable for the following components:      Result Value   Sodium 134 (*)    Glucose, Bld 105 (*)    All other components within normal limits  CBC - Abnormal; Notable for the following components:   RDW 16.0 (*)    Platelets 113 (*)    All other components within normal limits  TROPONIN I (HIGH SENSITIVITY) - Abnormal; Notable for the following components:   Troponin I (High Sensitivity) 61 (*)    All other components within normal limits  TROPONIN I (HIGH SENSITIVITY)     EKG EKG Interpretation  Date/Time:  Monday April 13 2021 17:20:34 EST Ventricular Rate:  87 PR Interval:  264 QRS Duration: 68 QT Interval:  368 QTC Calculation: 442 R Axis:   -29 Text Interpretation: Sinus rhythm with sinus arrhythmia with 1st degree A-V block Low voltage QRS Anteroseptal infarct , possibly acute Abnormal ECG When compared with ECG of 01/18/2021, No significant change was found Confirmed by Dione Booze ([id number]) on 04/13/2021 11:34:49 PM  Radiology DG Chest 2 View  Result Date: 04/13/2021 CLINICAL DATA:  Chest pain since 3 p.m. EXAM: CHEST - 2 VIEW COMPARISON:  January 18, 2021 FINDINGS: The heart size and mediastinal contours are within normal limits. Chronic bronchitic lung changes. No focal airspace consolidation. No pleural effusion. No pneumothorax. Thoracic spondylosis. IMPRESSION: No active cardiopulmonary disease. Electronically Signed   By: Maudry Mayhew M.D.   On: 04/13/2021 19:05    Procedures Procedures  CRITICAL CARE Performed by: Dione Booze Total critical care time: 40 minutes Critical care time was exclusive of separately billable procedures and treating other patients. Critical care was necessary to treat or prevent imminent or life-threatening deterioration. Critical care was time spent personally by me on the following activities: development of treatment plan with patient and/or surrogate as well as nursing, discussions with consultants, evaluation of patient's response to treatment, examination of patient, obtaining history from patient or surrogate, ordering and performing treatments and interventions, ordering and review of laboratory studies, ordering and review of radiographic studies, pulse oximetry and re-evaluation of patient's condition.  Medications Ordered in ED Medications  aspirin chewable tablet 324 mg (has no administration in time range)    ED Course  I have reviewed the triage vital signs and the nursing  notes.  Pertinent labs & imaging results that were available during my care of the patient were reviewed by me and considered in my medical decision making (see chart for details).   MDM Rules/Calculators/A&P                         Chest pain and patient with known history of coronary artery disease.  He ECG shows some ST elevation in the anterior leads with ST depression in the anterolateral leads, but this is unchanged from a prior ECG and does not represent STEMI.  Chest x-ray showed no acute process.  Initial troponin was normal, but repeat troponin increased to 61 indicating non-STEMI.  Thrombocytopenia is present but unchanged from baseline and not of a critical level.  Mild hyponatremia is present which is not felt  to be clinically significant.  His given a dose of aspirin and is started on heparin.  Case is discussed with Dr. Allena Katz of [facility], who agrees to admit the patient.  Case also discussed with Dr. Cherly Beach of cardiology service, who agrees to see the patient in consultation.  Old records are reviewed showing cardiac catheterization 12/03/2019 at which time right coronary artery stent was placed.  He also had thrombectomy of right posterior atrioventricular artery with balloon angioplasty but no stent placement with residual 40% stenosis.  He also had 25% stenosis of the proximal right coronary artery and the proximal left anterior descending artery.  Final Clinical Impression(s) / ED Diagnoses Final diagnoses:  Non-STEMI (non-ST elevated myocardial infarction) (HCC)  Thrombocytopenia (HCC)  Hyponatremia    Rx / DC Orders ED Discharge Orders     None        Dione Booze, MD 04/14/21 0010

## 2021-04-13 NOTE — ED Notes (Signed)
Troponin 61, PA Sam made aware

## 2021-04-14 ENCOUNTER — Encounter (HOSPITAL_COMMUNITY)
Admission: EM | Disposition: A | Discharge status: Home / Self Care | Payer: Self-pay | Source: Home / Self Care | Attending: Emergency Medicine

## 2021-04-14 ENCOUNTER — Observation Stay (HOSPITAL_BASED_OUTPATIENT_CLINIC_OR_DEPARTMENT_OTHER): Payer: Medicare HMO

## 2021-04-14 DIAGNOSIS — I25118 Atherosclerotic heart disease of native coronary artery with other forms of angina pectoris: Secondary | ICD-10-CM

## 2021-04-14 DIAGNOSIS — R079 Chest pain, unspecified: Secondary | ICD-10-CM | POA: Diagnosis present

## 2021-04-14 DIAGNOSIS — I2510 Atherosclerotic heart disease of native coronary artery without angina pectoris: Secondary | ICD-10-CM | POA: Diagnosis present

## 2021-04-14 DIAGNOSIS — I214 Non-ST elevation (NSTEMI) myocardial infarction: Secondary | ICD-10-CM | POA: Diagnosis not present

## 2021-04-14 DIAGNOSIS — I200 Unstable angina: Secondary | ICD-10-CM | POA: Insufficient documentation

## 2021-04-14 HISTORY — PX: LEFT HEART CATH AND CORONARY ANGIOGRAPHY: CATH118249

## 2021-04-14 LAB — CBC
HCT: 41.9 % (ref 39.0–52.0)
Hemoglobin: 13.3 g/dL (ref 13.0–17.0)
MCH: 28.5 pg (ref 26.0–34.0)
MCHC: 31.7 g/dL (ref 30.0–36.0)
MCV: 89.7 fL (ref 80.0–100.0)
Platelets: 101 10*3/uL — ABNORMAL LOW (ref 150–400)
RBC: 4.67 MIL/uL (ref 4.22–5.81)
RDW: 15.9 % — ABNORMAL HIGH (ref 11.5–15.5)
WBC: 7.6 10*3/uL (ref 4.0–10.5)
nRBC: 0 % (ref 0.0–0.2)

## 2021-04-14 LAB — RESP PANEL BY RT-PCR (FLU A&B, COVID) ARPGX2
Influenza A by PCR: NEGATIVE
Influenza B by PCR: NEGATIVE
SARS Coronavirus 2 by RT PCR: NEGATIVE

## 2021-04-14 LAB — TROPONIN I (HIGH SENSITIVITY): Troponin I (High Sensitivity): 94 ng/L — ABNORMAL HIGH (ref <18)

## 2021-04-14 LAB — ECHOCARDIOGRAM COMPLETE
Area-P 1/2: 3.45 cm2
Height: 73 in
S' Lateral: 2.6 cm
Weight: 4480 oz

## 2021-04-14 LAB — BASIC METABOLIC PANEL
Anion gap: 7 (ref 5–15)
BUN: 14 mg/dL (ref 8–23)
CO2: 26 mmol/L (ref 22–32)
Calcium: 9.3 mg/dL (ref 8.9–10.3)
Chloride: 102 mmol/L (ref 98–111)
Creatinine, Ser: 1.07 mg/dL (ref 0.61–1.24)
GFR, Estimated: >60 mL/min (ref >60)
Glucose, Bld: 113 mg/dL — ABNORMAL HIGH (ref 70–99)
Potassium: 3.9 mmol/L (ref 3.5–5.1)
Sodium: 135 mmol/L (ref 135–145)

## 2021-04-14 LAB — HEPARIN LEVEL (UNFRACTIONATED): Heparin Unfractionated: 0.33 IU/mL (ref 0.30–0.70)

## 2021-04-14 SURGERY — LEFT HEART CATH AND CORONARY ANGIOGRAPHY
Anesthesia: LOCAL

## 2021-04-14 MED ORDER — ACETAMINOPHEN 325 MG PO TABS: 650.0000 mg | ORAL_TABLET | Freq: Four times a day (QID) | ORAL | Status: DC | PRN

## 2021-04-14 MED ORDER — HEPARIN (PORCINE) 25000 UT/250ML-% IV SOLN
1600.0000 [IU]/h | INTRAVENOUS | Status: DC
Start: 2021-04-14 — End: 2021-04-14
  Administered 2021-04-14: 08:00:00 1600 [IU]/h via INTRAVENOUS
  Filled 2021-04-14: qty 250

## 2021-04-14 MED ORDER — VERAPAMIL HCL 2.5 MG/ML IV SOLN
INTRAVENOUS | Status: DC | PRN
  Administered 2021-04-14 (×2): 10 mL via INTRA_ARTERIAL

## 2021-04-14 MED ORDER — ENOXAPARIN SODIUM 40 MG/0.4ML IJ SOSY: 40.0000 mg | PREFILLED_SYRINGE | INTRAMUSCULAR | Status: DC

## 2021-04-14 MED ORDER — ONDANSETRON HCL 4 MG PO TABS: 4.0000 mg | ORAL_TABLET | Freq: Four times a day (QID) | ORAL | Status: DC | PRN

## 2021-04-14 MED ORDER — NITROGLYCERIN 0.4 MG SL SUBL: 0.4000 mg | SUBLINGUAL_TABLET | SUBLINGUAL | Status: DC | PRN

## 2021-04-14 MED ORDER — SODIUM CHLORIDE 0.9 % IV SOLN: 250.0000 mL | INTRAVENOUS | Status: DC | PRN

## 2021-04-14 MED ORDER — SODIUM CHLORIDE 0.9 % WEIGHT BASED INFUSION: 3.0000 mL/kg/h | INTRAVENOUS | Status: DC

## 2021-04-14 MED ORDER — ASPIRIN 81 MG PO CHEW
81.0000 mg | CHEWABLE_TABLET | Freq: Every day | ORAL | Status: DC
  Administered 2021-04-14 – 2021-04-15 (×2): 81 mg via ORAL
  Filled 2021-04-14 (×2): qty 1

## 2021-04-14 MED ORDER — SODIUM CHLORIDE 0.9 % WEIGHT BASED INFUSION
1.0000 mL/kg/h | INTRAVENOUS | Status: DC
  Administered 2021-04-14: 16:00:00 1 mL/kg/h via INTRAVENOUS

## 2021-04-14 MED ORDER — SODIUM CHLORIDE 0.9% FLUSH
3.0000 mL | Freq: Two times a day (BID) | INTRAVENOUS | Status: DC
  Administered 2021-04-14 – 2021-04-15 (×2): 3 mL via INTRAVENOUS

## 2021-04-14 MED ORDER — MIDAZOLAM HCL 2 MG/2ML IJ SOLN
INTRAMUSCULAR | Status: AC
  Filled 2021-04-14: qty 2

## 2021-04-14 MED ORDER — HEPARIN SODIUM (PORCINE) 1000 UNIT/ML IJ SOLN
INTRAMUSCULAR | Status: AC
  Filled 2021-04-14: qty 10

## 2021-04-14 MED ORDER — ONDANSETRON HCL 4 MG/2ML IJ SOLN: 4.0000 mg | Freq: Four times a day (QID) | INTRAMUSCULAR | Status: DC | PRN

## 2021-04-14 MED ORDER — SODIUM CHLORIDE 0.9% FLUSH: 3.0000 mL | INTRAVENOUS | Status: DC | PRN

## 2021-04-14 MED ORDER — HEPARIN SODIUM (PORCINE) 1000 UNIT/ML IJ SOLN
INTRAMUSCULAR | Status: DC | PRN
  Administered 2021-04-14: 6000 [IU] via INTRAVENOUS

## 2021-04-14 MED ORDER — ACETAMINOPHEN 650 MG RE SUPP: 650.0000 mg | Freq: Four times a day (QID) | RECTAL | Status: DC | PRN

## 2021-04-14 MED ORDER — VERAPAMIL HCL 2.5 MG/ML IV SOLN
INTRAVENOUS | Status: AC
  Filled 2021-04-14: qty 2

## 2021-04-14 MED ORDER — ACETAMINOPHEN 325 MG PO TABS: 650.0000 mg | ORAL_TABLET | ORAL | Status: DC | PRN

## 2021-04-14 MED ORDER — SODIUM CHLORIDE 0.9 % WEIGHT BASED INFUSION: 1.0000 mL/kg/h | INTRAVENOUS | Status: DC

## 2021-04-14 MED ORDER — SODIUM CHLORIDE 0.9% FLUSH: 3.0000 mL | Freq: Two times a day (BID) | INTRAVENOUS | Status: DC

## 2021-04-14 MED ORDER — HEPARIN (PORCINE) IN NACL 1000-0.9 UT/500ML-% IV SOLN
INTRAVENOUS | Status: AC
  Filled 2021-04-14: qty 1000

## 2021-04-14 MED ORDER — MIDAZOLAM HCL 2 MG/2ML IJ SOLN
INTRAMUSCULAR | Status: DC | PRN
  Administered 2021-04-14: 2 mg via INTRAVENOUS

## 2021-04-14 MED ORDER — FENTANYL CITRATE (PF) 100 MCG/2ML IJ SOLN
INTRAMUSCULAR | Status: AC
  Filled 2021-04-14: qty 2

## 2021-04-14 MED ORDER — FENTANYL CITRATE (PF) 100 MCG/2ML IJ SOLN
INTRAMUSCULAR | Status: DC | PRN
  Administered 2021-04-14: 25 ug via INTRAVENOUS

## 2021-04-14 MED ORDER — IOHEXOL 350 MG/ML SOLN
INTRAVENOUS | Status: DC | PRN
  Administered 2021-04-14: 17:00:00 60 mL

## 2021-04-14 MED ORDER — ASPIRIN 81 MG PO CHEW: 81.0000 mg | CHEWABLE_TABLET | ORAL | Status: DC

## 2021-04-14 MED ORDER — UMECLIDINIUM BROMIDE 62.5 MCG/ACT IN AEPB
1.0000 | INHALATION_SPRAY | Freq: Every day | RESPIRATORY_TRACT | Status: DC
  Administered 2021-04-15: 08:00:00 1 via RESPIRATORY_TRACT
  Filled 2021-04-14: qty 7

## 2021-04-14 MED ORDER — PERFLUTREN LIPID MICROSPHERE
1.0000 mL | INTRAVENOUS | Status: AC | PRN
Start: 2021-04-14 — End: 2021-04-14
  Administered 2021-04-14: 09:00:00 2 mL via INTRAVENOUS
  Filled 2021-04-14: qty 10

## 2021-04-14 MED ORDER — MOMETASONE FURO-FORMOTEROL FUM 100-5 MCG/ACT IN AERO
2.0000 | INHALATION_SPRAY | Freq: Two times a day (BID) | RESPIRATORY_TRACT | Status: DC
  Administered 2021-04-14 – 2021-04-15 (×2): 2 via RESPIRATORY_TRACT
  Filled 2021-04-14: qty 8.8

## 2021-04-14 MED ORDER — HYDRALAZINE HCL 20 MG/ML IJ SOLN: 10.0000 mg | INTRAMUSCULAR | Status: AC | PRN

## 2021-04-14 MED ORDER — HEPARIN BOLUS VIA INFUSION
4000.0000 [IU] | Freq: Once | INTRAVENOUS | Status: AC
  Administered 2021-04-14: 08:00:00 4000 [IU] via INTRAVENOUS
  Filled 2021-04-14: qty 4000

## 2021-04-14 MED ORDER — ISOSORBIDE MONONITRATE ER 30 MG PO TB24
30.0000 mg | ORAL_TABLET | Freq: Every day | ORAL | Status: DC
  Administered 2021-04-14 – 2021-04-15 (×2): 30 mg via ORAL
  Filled 2021-04-14 (×2): qty 1

## 2021-04-14 MED ORDER — CLOPIDOGREL BISULFATE 300 MG PO TABS
600.0000 mg | ORAL_TABLET | Freq: Once | ORAL | Status: AC
Start: 2021-04-14 — End: 2021-04-14
  Administered 2021-04-14: 08:00:00 600 mg via ORAL
  Filled 2021-04-14: qty 2

## 2021-04-14 MED ORDER — CLOPIDOGREL BISULFATE 75 MG PO TABS
75.0000 mg | ORAL_TABLET | Freq: Every day | ORAL | Status: DC
  Administered 2021-04-15: 09:00:00 75 mg via ORAL
  Filled 2021-04-14: qty 1

## 2021-04-14 MED ORDER — HEPARIN (PORCINE) IN NACL 1000-0.9 UT/500ML-% IV SOLN
INTRAVENOUS | Status: DC | PRN
  Administered 2021-04-14 (×2): 500 mL

## 2021-04-14 MED ORDER — SODIUM CHLORIDE 0.9 % IV SOLN: INTRAVENOUS | Status: AC

## 2021-04-14 MED ORDER — SENNOSIDES-DOCUSATE SODIUM 8.6-50 MG PO TABS: 1.0000 | ORAL_TABLET | Freq: Every evening | ORAL | Status: DC | PRN

## 2021-04-14 MED ORDER — BUDESON-GLYCOPYRROL-FORMOTEROL 160-9-4.8 MCG/ACT IN AERO: 2.0000 | INHALATION_SPRAY | Freq: Two times a day (BID) | RESPIRATORY_TRACT | Status: DC

## 2021-04-14 MED ORDER — ROSUVASTATIN CALCIUM 20 MG PO TABS
40.0000 mg | ORAL_TABLET | Freq: Every day | ORAL | Status: DC
  Administered 2021-04-14 – 2021-04-15 (×2): 40 mg via ORAL
  Filled 2021-04-14 (×2): qty 2

## 2021-04-14 MED ORDER — LOSARTAN POTASSIUM 50 MG PO TABS
50.0000 mg | ORAL_TABLET | Freq: Every day | ORAL | Status: DC
  Administered 2021-04-14 – 2021-04-15 (×2): 50 mg via ORAL
  Filled 2021-04-14 (×2): qty 1

## 2021-04-14 MED ORDER — LIDOCAINE HCL (PF) 1 % IJ SOLN
INTRAMUSCULAR | Status: DC | PRN
  Administered 2021-04-14: 2 mL

## 2021-04-14 MED ORDER — ALBUTEROL SULFATE (2.5 MG/3ML) 0.083% IN NEBU: 2.5000 mg | INHALATION_SOLUTION | RESPIRATORY_TRACT | Status: DC | PRN

## 2021-04-14 MED ORDER — LABETALOL HCL 5 MG/ML IV SOLN: 10.0000 mg | INTRAVENOUS | Status: AC | PRN

## 2021-04-14 MED ORDER — LIDOCAINE HCL (PF) 1 % IJ SOLN
INTRAMUSCULAR | Status: AC
  Filled 2021-04-14: qty 30

## 2021-04-14 MED ORDER — SODIUM CHLORIDE 0.9% FLUSH
3.0000 mL | Freq: Two times a day (BID) | INTRAVENOUS | Status: DC
  Administered 2021-04-14: 01:00:00 3 mL via INTRAVENOUS

## 2021-04-14 MED ORDER — METOPROLOL TARTRATE 25 MG PO TABS
25.0000 mg | ORAL_TABLET | Freq: Two times a day (BID) | ORAL | Status: DC
  Administered 2021-04-14 – 2021-04-15 (×4): 25 mg via ORAL
  Filled 2021-04-14 (×4): qty 1

## 2021-04-14 SURGICAL SUPPLY — 11 items
CATH INFINITI 5 FR JL3.5 (CATHETERS) ×2 IMPLANT
CATH INFINITI JR4 5F (CATHETERS) ×2 IMPLANT
DEVICE RAD COMP TR BAND LRG (VASCULAR PRODUCTS) ×2 IMPLANT
GLIDESHEATH SLEND SS 6F .021 (SHEATH) ×2 IMPLANT
GUIDEWIRE INQWIRE 1.5J.035X260 (WIRE) IMPLANT
INQWIRE 1.5J .035X260CM (WIRE) ×3
KIT HEART LEFT (KITS) ×3 IMPLANT
PACK CARDIAC CATHETERIZATION (CUSTOM PROCEDURE TRAY) ×3 IMPLANT
SHEATH PROBE COVER 6X72 (BAG) ×2 IMPLANT
TRANSDUCER W/STOPCOCK (MISCELLANEOUS) ×3 IMPLANT
TUBING CIL FLEX 10 FLL-RA (TUBING) ×3 IMPLANT

## 2021-04-14 NOTE — Progress Notes (Signed)
Next trop returned 94. D/w fellow, note pending -> plan to start heparin per pharmacy. Per d/w IM, pt remains CP free. Pt will be formally re-evaluated in rounds today.

## 2021-04-14 NOTE — Consult Note (Signed)
Cardiology Consultation:   Patient ID: Jorge Clements MRN: [id number]; DOB: [birth year]  Admit date: 04/13/2021 Date of Consult: 04/14/2021  Primary Care Provider: Benita Stabile, MD [facility] Cardiologist: Jorge Haws, MD  [facility] Electrophysiologist:  None   Patient Profile:   Jorge Clements is a 75 y.o. male with CAD s/p PCI/DES to RCA (11/2019), PAD s/p right fem-pop bypass and right TMA, HTN, HLD, and COPD who is being seen today for the evaluation of chest pain at the request of Dr. Preston Clements.   History of Present Illness:   [name] had acute onset chest pain and lightheadedness around 1500 12/26 for which he took 2 SLNG with some symptom improvement and pain relief following continue to fill weak and lightheaded.  Pain started at rest and felt like a chest tightness diaphoresis or nausea.   VS on EMS arrival: P 87, BP 175/75, RR 19, T 39F, O2 97%/RA.   During my evaluation patient is resting comfortably in bed. Reports around 1500 was at home sitting watching TV. Noticed heart was "beating harder" than usual but denied accelerated HR or irregularity. Associated chest pain, L sided, middle of chest, 2/10 severity, L hand numbness later. Similar sx to prior cardiac evaluations although last time was more severe. Has SOB all the time 2/2 COPD.   VS in ED: P 58, BP 130/65 (84), RR 19, 100%/RA.   Social history  Lives at home, son and X wife on property.Worked at [employer]. Smoked >60 pack years. Quite working 7 years ago, quite smoking 9 years ago.   Past Medical History:  Diagnosis Date   Abdominal aortic aneurysm    no AAA on [place] in 2020   CAD (coronary artery disease)    Stent to RCA 2021   Chronic obstructive pulmonary disease    HLD (hyperlipidemia)    HTN (hypertension)    Peripheral artery disease    s/p R fem pop // s/p R transmet amp   Past Surgical History:  Procedure Laterality Date   ABDOMINAL AORTOGRAM N/A 08/17/2017   Procedure:  ABDOMINAL AORTOGRAM;  Surgeon: Jorge Harman, MD;  Location: [facility] INVASIVE CV LAB;  Service: Cardiovascular;  Laterality: N/A;   AMPUTATION Right 08/21/2017   Procedure: TRANSMETATARSAL AMPUTATION RIGHT FOOT;  Surgeon: Jorge Mustard, MD;  Location: [facility] OR;  Service: Orthopedics;  Laterality: Right;   AMPUTATION TOE Right 08/18/2017   Procedure: AMPUTATION RIGHT SECOND AND THIRD TOES;  Surgeon: Jorge Harman, MD;  Location: [facility] OR;  Service: Vascular;  Laterality: Right;   CORONARY BALLOON ANGIOPLASTY N/A 12/03/2019   Procedure: CORONARY BALLOON ANGIOPLASTY;  Surgeon: Jorge Crafts, MD;  Location: [facility] INVASIVE CV LAB;  Service: Cardiovascular;  Laterality: N/A;   CORONARY STENT INTERVENTION N/A 12/03/2019   Procedure: CORONARY STENT INTERVENTION;  Surgeon: Jorge Crafts, MD;  Location: [facility] INVASIVE CV LAB;  Service: Cardiovascular;  Laterality: N/A;   ENDARTERECTOMY FEMORAL Right 08/18/2017   Procedure: ENDARTERECTOMY RIGHT SUPERFICIAL Jorge Clements;  Surgeon: Jorge Harman, MD;  Location: [facility] OR;  Service: Vascular;  Laterality: Right;   FEMORAL-POPLITEAL BYPASS GRAFT Right 08/18/2017   Procedure: BYPASS GRAFT RIGHT COMMON FEMORAL TO ABOVE KNEE POPLITEAL ARTERY USING RIGHT REVERSED GREAT SAPHENOUS VEIN;  Surgeon: Jorge Harman, MD;  Location: [facility] OR;  Service: Vascular;  Laterality: Right;   LEFT HEART CATH AND CORONARY ANGIOGRAPHY N/A 12/03/2019   Procedure: LEFT HEART CATH AND CORONARY ANGIOGRAPHY;  Surgeon: Jorge Crafts, MD;  Location: [facility] INVASIVE CV  LAB;  Service: Cardiovascular;  Laterality: N/A;  ° LOWER EXTREMITY ANGIOGRAPHY Right 08/17/2017  ° Procedure: Lower Extremity Angiography;  Surgeon: Cain, Brandon Christopher, MD;  Location: [facility] INVASIVE CV LAB;  Service: Cardiovascular;  Laterality: Right;  ° PERIPHERAL VASCULAR BALLOON ANGIOPLASTY Right 08/17/2017  ° Procedure: PERIPHERAL VASCULAR BALLOON ANGIOPLASTY;  Surgeon: Cain, Brandon  Christopher, MD;  Location: [facility] INVASIVE CV LAB;  Service: Cardiovascular;  Laterality: Right;  SFA UNABLE TO CROSS  ° VEIN HARVEST Right 08/18/2017  ° Procedure: VEIN HARVEST RIGHT GREAT SAPHENOUS;  Surgeon: Cain, Brandon Christopher, MD;  Location: [facility] OR;  Service: Vascular;  Laterality: Right;  ° WOUND DEBRIDEMENT Right 08/18/2017  ° Procedure: DEBRIDEMENT WOUND RIGHT FOOT;  Surgeon: Cain, Brandon Christopher, MD;  Location: [facility] OR;  Service: Vascular;  Laterality: Right;  °  °Home Medications:  °Prior to Admission medications   °Medication Sig Start Date End Date Taking? Authorizing Provider  °metoprolol tartrate (LOPRESSOR) 25 MG tablet Take 1 tablet by mouth twice daily 04/06/21   Nishan, Peter C, MD  °Albuterol Sulfate 108 (90 Base) MCG/ACT AEPB Inhale 90 mcg into the lungs.    [provider]  °aspirin 81 MG chewable tablet Chew 1 tablet (81 mg total) by mouth daily. 12/04/19   McDaniel, Jill D, NP  °BREZTRI AEROSPHERE 160-9-4.8 MCG/ACT AERO Inhale 2 puffs into the lungs 2 (two) times daily. 07/09/19   [provider]  °losartan (COZAAR) 50 MG tablet Take 1 tablet by mouth once daily 12/08/20   Quinn, Andrew L Jr., NP  °Multiple Vitamins-Minerals (CENTRUM SILVER 50+MEN PO) Take by mouth.    [provider]  °nitroGLYCERIN (NITROSTAT) 0.4 MG SL tablet Place 1 tablet (0.4 mg total) under the tongue every 5 (five) minutes as needed for chest pain. 12/13/19 03/18/21  Quinn, Andrew L Jr., NP  °rosuvastatin (CRESTOR) 40 MG tablet Take 1 tablet by mouth once daily 07/16/20   McDaniel, Jill D, NP  ° °Inpatient Medications: °Scheduled Meds: ° aspirin  81 mg Oral Daily  ° Budeson-Glycopyrrol-Formoterol  2 puff Inhalation BID  ° enoxaparin (LOVENOX) injection  40 mg Subcutaneous Q24H  ° losartan  50 mg Oral Daily  ° metoprolol tartrate  25 mg Oral BID  ° rosuvastatin  40 mg Oral Daily  ° sodium chloride flush  3 mL Intravenous Q12H  ° °Continuous Infusions: ° °PRN Meds: °acetaminophen **OR**  acetaminophen, albuterol, nitroGLYCERIN, ondansetron **OR** ondansetron (ZOFRAN) IV, senna-docusate ° °Allergies:    °Allergies  °Allergen Reactions  ° Penicillins Hives and Rash  °  Childhood reaction °Has patient had a PCN reaction causing immediate rash, facial/tongue/throat swelling, SOB or lightheadedness with hypotension: No °PT DEVELOPED ## SEVERE ## RASH INVOLVING MUCUS MEMBRANES or SKIN NECROSIS: #  #  YES  #  # °Has patient had a PCN reaction that required hospitalization: Unknown °Has patient had a PCN reaction occurring within the last 10 years: No  ° Clindamycin Rash  °  Diffuse rash across body  ° °Social History:   °Social History  ° °Socioeconomic History  ° Marital status: Divorced  °  Spouse name: Not on file  ° Number of children: Not on file  ° Years of education: Not on file  ° Highest education level: Not on file  °Occupational History  ° Not on file  °Tobacco Use  ° Smoking status: Former  ° Smokeless tobacco: Never  °Vaping Use  ° Vaping Use: Former  °Substance and Sexual Activity  ° Alcohol use: Not Currently  °   Drug use: No   Sexual activity: Not on file  Other Topics Concern   Not on file  Social History Narrative   Not on file   Social Determinants of Health   Financial Resource Strain: Not on file  Food Insecurity: Not on file  Transportation Needs: Not on file  Physical Activity: Not on file  Stress: Not on file  Social Connections: Not on file  Intimate Partner Violence: Not on file    Family History:    Family History  Adopted: Yes    ROS:  Review of Systems: [y] = yes, [ ]  = no      General: Weight gain [ ] ; Weight loss [ ] ; Anorexia [ ] ; Fatigue [ ] ; Fever [ ] ; Chills [ ] ; Weakness [ ]    Cardiac: Chest pain/pressure [y]; Resting SOB [ ] ; Exertional SOB [ ] ; Orthopnea [ ] ; Pedal Edema [ ] ; Palpitations [ ] ; Syncope [ ] ; Presyncope [ ] ; Paroxysmal nocturnal dyspnea [ ]    Pulmonary: Cough [ ] ; Wheezing [ ] ; Hemoptysis [ ] ; Sputum [ ] ; Snoring [ ]    GI:  Vomiting [ ] ; Dysphagia [ ] ; Melena [ ] ; Hematochezia [ ] ; Heartburn [ ] ; Abdominal pain [ ] ; Constipation [ ] ; Diarrhea [ ] ; BRBPR [ ]    GU: Hematuria [ ] ; Dysuria [ ] ; Nocturia [ ]  Vascular: Pain in legs with walking [ ] ; Pain in feet with lying flat [ ] ; Non-healing sores [ ] ; Stroke [ ] ; TIA [ ] ; Slurred speech [ ] ;   Neuro: Headaches [ ] ; Vertigo [ ] ; Seizures [ ] ; Paresthesias [ ] ;Blurred vision [ ] ; Diplopia [ ] ; Vision changes [ ]    Ortho/Skin: Arthritis [ ] ; Joint pain [ ] ; Muscle pain [ ] ; Joint swelling [ ] ; Back Pain [ ] ; Rash [ ]    Psych: Depression [ ] ; Anxiety [ ]    Heme: Bleeding problems [ ] ; Clotting disorders [ ] ; Anemia [ ]    Endocrine: Diabetes [ ] ; Thyroid dysfunction [ ]    Physical Exam/Data:   Vitals:   04/13/21 2103 04/13/21 2226 04/13/21 2330 04/14/21 0000  BP: 133/71 135/81 (!) 145/70 (!) 155/64  Pulse: 70 70 70 68  Resp: 17 16 15 17   Temp:  97.9 F (36.6 Clements)    TempSrc:      SpO2: 100% 100% 98% 98%  Weight:    127 kg  Height:    6\' 1"  (1.854 m)   No intake or output data in the 24 hours ending 04/14/21 0108 Last 3 Weights 04/14/2021 03/31/2021 02/19/2021  Weight (lbs) 280 lb 280 lb 287 lb  Weight (kg) 127.007 kg 127.007 kg 130.182 kg     Body mass index is 36.94 kg/m.  General:  Well nourished, well developed, in no acute distress HEENT: normal Lymph: no adenopathy Neck: no JVD Endocrine:  No thryomegaly Vascular: No carotid bruits; FA pulses 2+ bilaterally without bruits  Cardiac:  normal S1, S2; RRR; no murmur  Lungs:  clear to auscultation bilaterally, no wheezing, rhonchi or rales  Abd: soft, nontender, no hepatomegaly  Ext: no edema Musculoskeletal:  No deformities, BUE and BLE strength normal and equal Skin: warm and dry  Neuro:  CNs 2-12 intact, no focal abnormalities noted Psych:  Normal affect   EKG:  The EKG was personally reviewed and demonstrates:  04/13/21 (17:20:34), NSR, ventricular rate 87, PR 264, QRS 68, Qtc 442, 1st deg AVB, no  ischemic changes Telemetry:  Telemetry was personally reviewed and demonstrates: NSR  Relevant CV Studies:  Coronary angiography/PCI Result date: 12/03/19 High bifurcation of the posterior descending artery and posterior lateral artery in the mid RCA. RCA lesion, right at this bifurcation, is 99% stenosed. A drug-eluting stent was successfully placed using a STENT RESOLUTE ONYX 2.5X30, postdilated with a 2.75 balloon, including final kissing balloon angioplasty. Post intervention, there is a 0% residual stenosis. RPAV lesion is 99% stenosed. Thrombectomy was performed. Final, kissing balloon angioplasty was performed using a BALLOON SAPPHIRE 2.25X15. Post intervention, there is a 40% residual stenosis. Prox LAD to Mid LAD lesion is 25% stenosed. Prox RCA lesion is 25% stenosed. The left ventricular systolic function is normal. LV end diastolic pressure is normal. The left ventricular ejection fraction is 55-65% by visual estimate. There is no aortic valve stenosis. Balloon angioplasty was performed using a BALLOON SAPPHIRE 2.25X15. A drug-eluting stent was successfully placed using a STENT RESOLUTE ONYX 2.5X30.  TTE Result date: 12/02/19  1. Left ventricular ejection fraction, by estimation, is 65 to 70%. The  left ventricle has normal function. The left ventricle has no regional  wall motion abnormalities. Left ventricular diastolic parameters were  normal.   2. Right ventricular systolic function is normal. The right ventricular  size is normal. There is normal pulmonary artery systolic pressure.   3. The mitral valve is normal in structure. Trivial mitral valve  regurgitation. No evidence of mitral stenosis.   4. The aortic valve is grossly normal. Aortic valve regurgitation is not  visualized. No aortic stenosis is present.   5. The inferior vena cava is normal in size with greater than 50%  respiratory variability, suggesting right atrial pressure of 3 mmHg.    Laboratory  Data:  High Sensitivity Troponin:   Recent Labs  Lab 04/13/21 1806 04/13/21 2108  TROPONINIHS 17 61*     Chemistry Recent Labs  Lab 04/13/21 1806  NA 134*  K 3.6  CL 100  CO2 27  GLUCOSE 105*  BUN 15  CREATININE 1.10  CALCIUM 9.2  GFRNONAA >60  ANIONGAP 7    No results for input(s): PROT, ALBUMIN, AST, ALT, ALKPHOS, BILITOT in the last 168 hours. Hematology Recent Labs  Lab 04/13/21 1806  WBC 7.9  RBC 4.76  HGB 13.2  HCT 42.2  MCV 88.7  MCH 27.7  MCHC 31.3  RDW 16.0*  PLT 113*   BNPNo results for input(s): BNP, PROBNP in the last 168 hours.  DDimer No results for input(s): DDIMER in the last 168 hours.  Radiology/Studies:  DG Chest 2 View  Result Date: 04/13/2021 CLINICAL DATA:  Chest pain since 3 p.m. EXAM: CHEST - 2 VIEW COMPARISON:  January 18, 2021 FINDINGS: The heart size and mediastinal contours are within normal limits. Chronic bronchitic lung changes. No focal airspace consolidation. No pleural effusion. No pneumothorax. Thoracic spondylosis. IMPRESSION: No active cardiopulmonary disease. Electronically Signed   By: Dahlia Bailiff M.Clements.   On: 04/13/2021 19:05    TIMI Risk Score for Unstable Angina or Non-ST Elevation MI:   The patient's TIMI risk score is 4, which indicates a 20% risk of all cause mortality, new or recurrent myocardial infarction or need for urgent revascularization in the next 14 days.  Assessment and Plan:   NSTEMI Typical cardiac CP Mr. Alberson presents with 1 episode of mild chest discomfort which alleviated with and is not with hypertensive with systolics in the [id number] on arrival.  Symptoms seem consistent with cardiac etiology with uptrending troponin and similar ECG to from prior comparison.  We discussed different options for management of this and he would preference up titration medical management with no residual disease on his last coronary evaluation in August 2021.  Plan for addition of Imdur 30 mg daily and repeat echo today.   Since he has continued uptrending troponin will also be heparinized as pulm management for his NSTEMI.  Initially had plan to treat conservatively with titration of GDMT however troponin continued to uptrend.  He has BP room for medication uptitration.  His symptoms could be from multiple areas although he had POBA of RPAV last year without stent placement so this may be the culprit lesion. - start imdur 30 mg daily  - continue home meds: crestor 40 mg daily, ASA 81 mg daily, losartan 50 mg daily, metop tartrate 25 mg bid - stopped plavix in 11/2020 after 1 yr of tx for NSTEMI, would reload/resume and tx for repeat NSTEMI   For questions or updates, please contact [facility] Please consult [email or web address] for contact info under   Signed, Dion Body, MD  04/14/2021 1:08 AM

## 2021-04-14 NOTE — Progress Notes (Signed)
° °  75 yo man with known CAD  Followed by Dr. Eden Emms Hx of DES to the RCA in aug. 2021 Admitted by Dr. Benjamine Mola with CP Troponin is gradually increasing Heparin has been started.    I have discussed the risks, benefits, options of cath  He understands and agrees to proceed.     Kristeen Miss, MD  04/14/2021 8:26 AM    [facility] [address] [employer],  [address] [place], [place]  [address] Phone: [phone]; Fax: [phone]

## 2021-04-14 NOTE — Progress Notes (Deleted)
ANTICOAGULATION CONSULT NOTE - Follow Up Consult  Pharmacy Consult for heparin Indication: chest pain/ACS  Allergies  Allergen Reactions   Penicillins Hives and Rash    Childhood reaction Has patient had a PCN reaction causing immediate rash, facial/tongue/throat swelling, SOB or lightheadedness with hypotension: No PT DEVELOPED ## SEVERE ## RASH INVOLVING MUCUS MEMBRANES or SKIN NECROSIS: #  #  YES  #  # Has patient had a PCN reaction that required hospitalization: Unknown Has patient had a PCN reaction occurring within the last 10 years: No   Clindamycin Rash    Diffuse rash across body    Patient Measurements: Height: 6\' 1"  (185.4 cm) Weight: 127 kg (280 lb) IBW/kg (Calculated) : 79.9 Heparin Dosing Weight: 110kg  Vital Signs: Temp: 97.9 F (36.6 C) (12/26 2226) Temp Source: Oral (12/26 1718) BP: 155/64 (12/27 0000) Pulse Rate: 68 (12/27 0000)  Labs: Recent Labs    04/13/21 1806 04/13/21 2108  HGB 13.2  --   HCT 42.2  --   PLT 113*  --   CREATININE 1.10  --   TROPONINIHS 17 61*    Estimated Creatinine Clearance: 83.5 mL/min (by C-G formula based on SCr of 1.1 mg/dL).   Assessment: 75yo male c/o CP associated w/ dizziness, troponin initially negative but now rising, to begin heparin.  Goal of Therapy:  Heparin level 0.3-0.7 units/ml Monitor platelets by anticoagulation protocol: Yes   Plan:  Heparin 4000 units IV bolus x1 followed by infusion at 1600 units/hr (based on prior heparin requirements) and monitor heparin levels and CBC.  75yo, PharmD, BCPS  04/14/2021,12:36 AM

## 2021-04-14 NOTE — Interval H&P Note (Signed)
Cath Lab Visit (complete for each Cath Lab visit)  Clinical Evaluation Leading to the Procedure:   ACS: Yes.    Non-ACS:    Anginal Classification: CCS IV  Anti-ischemic medical therapy: Minimal Therapy (1 class of medications)  Non-Invasive Test Results: No non-invasive testing performed  Prior CABG: No previous CABG      History and Physical Interval Note:  04/14/2021 4:42 PM  Jorge Clements  has presented today for surgery, with the diagnosis of unstable angina.  The various methods of treatment have been discussed with the patient and family. After consideration of risks, benefits and other options for treatment, the patient has consented to  Procedure(s): LEFT HEART CATH AND CORONARY ANGIOGRAPHY (N/A) as a surgical intervention.  The patient's history has been reviewed, patient examined, no change in status, stable for surgery.  I have reviewed the patient's chart and labs.  Questions were answered to the patient's satisfaction.     Lance Muss

## 2021-04-14 NOTE — ED Notes (Addendum)
Troponin 94, Dr. Julian Reil made aware.

## 2021-04-14 NOTE — H&P (View-Only) (Signed)
Cardiology Consultation:   Patient ID: Jorge Clements MRN: [id number]; DOB: [birth year]  Admit date: 04/13/2021 Date of Consult: 04/14/2021  Primary Care Provider: Benita Stabile, MD [facility] Cardiologist: Charlton Haws, MD  [facility] Electrophysiologist:  None   Patient Profile:   Jorge Clements is a 75 y.o. male with CAD s/p PCI/DES to RCA (11/2019), PAD s/p right fem-pop bypass and right TMA, HTN, HLD, and COPD who is being seen today for the evaluation of chest pain at the request of Dr. Preston Fleeting.   History of Present Illness:   [name] had acute onset chest pain and lightheadedness around 1500 12/26 for which he took 2 SLNG with some symptom improvement and pain relief following continue to fill weak and lightheaded.  Pain started at rest and felt like a chest tightness diaphoresis or nausea.   VS on EMS arrival: P 87, BP 175/75, RR 19, T 39F, O2 97%/RA.   During my evaluation patient is resting comfortably in bed. Reports around 1500 was at home sitting watching TV. Noticed heart was "beating harder" than usual but denied accelerated HR or irregularity. Associated chest pain, L sided, middle of chest, 2/10 severity, L hand numbness later. Similar sx to prior cardiac evaluations although last time was more severe. Has SOB all the time 2/2 COPD.   VS in ED: P 58, BP 130/65 (84), RR 19, 100%/RA.   Social history  Lives at home, son and X wife on property.Worked at [employer]. Smoked >60 pack years. Quite working 7 years ago, quite smoking 9 years ago.   Past Medical History:  Diagnosis Date   Abdominal aortic aneurysm    no AAA on [place] in 2020   CAD (coronary artery disease)    Stent to RCA 2021   Chronic obstructive pulmonary disease    HLD (hyperlipidemia)    HTN (hypertension)    Peripheral artery disease    s/p R fem pop // s/p R transmet amp   Past Surgical History:  Procedure Laterality Date   ABDOMINAL AORTOGRAM N/A 08/17/2017   Procedure:  ABDOMINAL AORTOGRAM;  Surgeon: Maeola Harman, MD;  Location: [facility] INVASIVE CV LAB;  Service: Cardiovascular;  Laterality: N/A;   AMPUTATION Right 08/21/2017   Procedure: TRANSMETATARSAL AMPUTATION RIGHT FOOT;  Surgeon: Nadara Mustard, MD;  Location: [facility] OR;  Service: Orthopedics;  Laterality: Right;   AMPUTATION TOE Right 08/18/2017   Procedure: AMPUTATION RIGHT SECOND AND THIRD TOES;  Surgeon: Maeola Harman, MD;  Location: [facility] OR;  Service: Vascular;  Laterality: Right;   CORONARY BALLOON ANGIOPLASTY N/A 12/03/2019   Procedure: CORONARY BALLOON ANGIOPLASTY;  Surgeon: Corky Crafts, MD;  Location: [facility] INVASIVE CV LAB;  Service: Cardiovascular;  Laterality: N/A;   CORONARY STENT INTERVENTION N/A 12/03/2019   Procedure: CORONARY STENT INTERVENTION;  Surgeon: Corky Crafts, MD;  Location: [facility] INVASIVE CV LAB;  Service: Cardiovascular;  Laterality: N/A;   ENDARTERECTOMY FEMORAL Right 08/18/2017   Procedure: ENDARTERECTOMY RIGHT SUPERFICIAL Alfredia Ferguson;  Surgeon: Maeola Harman, MD;  Location: [facility] OR;  Service: Vascular;  Laterality: Right;   FEMORAL-POPLITEAL BYPASS GRAFT Right 08/18/2017   Procedure: BYPASS GRAFT RIGHT COMMON FEMORAL TO ABOVE KNEE POPLITEAL ARTERY USING RIGHT REVERSED GREAT SAPHENOUS VEIN;  Surgeon: Maeola Harman, MD;  Location: [facility] OR;  Service: Vascular;  Laterality: Right;   LEFT HEART CATH AND CORONARY ANGIOGRAPHY N/A 12/03/2019   Procedure: LEFT HEART CATH AND CORONARY ANGIOGRAPHY;  Surgeon: Corky Crafts, MD;  Location: [facility] INVASIVE CV  LAB;  Service: Cardiovascular;  Laterality: N/A;   LOWER EXTREMITY ANGIOGRAPHY Right 08/17/2017   Procedure: Lower Extremity Angiography;  Surgeon: Maeola Harman, MD;  Location: [facility] INVASIVE CV LAB;  Service: Cardiovascular;  Laterality: Right;   PERIPHERAL VASCULAR BALLOON ANGIOPLASTY Right 08/17/2017   Procedure: PERIPHERAL VASCULAR BALLOON ANGIOPLASTY;  Surgeon: Maeola Harman, MD;  Location: [facility] INVASIVE CV LAB;  Service: Cardiovascular;  Laterality: Right;  SFA UNABLE TO CROSS   VEIN HARVEST Right 08/18/2017   Procedure: VEIN HARVEST RIGHT GREAT SAPHENOUS;  Surgeon: Maeola Harman, MD;  Location: [facility] OR;  Service: Vascular;  Laterality: Right;   WOUND DEBRIDEMENT Right 08/18/2017   Procedure: DEBRIDEMENT WOUND RIGHT FOOT;  Surgeon: Maeola Harman, MD;  Location: [facility] OR;  Service: Vascular;  Laterality: Right;    Home Medications:  Prior to Admission medications   Medication Sig Start Date End Date Taking? Authorizing Provider  metoprolol tartrate (LOPRESSOR) 25 MG tablet Take 1 tablet by mouth twice daily 04/06/21   Wendall Stade, MD  Albuterol Sulfate 108 (90 Base) MCG/ACT AEPB Inhale 90 mcg into the lungs.    [provider]  aspirin 81 MG chewable tablet Chew 1 tablet (81 mg total) by mouth daily. 12/04/19   Filbert Schilder, NP  BREZTRI AEROSPHERE 160-9-4.8 MCG/ACT AERO Inhale 2 puffs into the lungs 2 (two) times daily. 07/09/19   [provider]  losartan (COZAAR) 50 MG tablet Take 1 tablet by mouth once daily 12/08/20   Netta Neat., NP  Multiple Vitamins-Minerals (CENTRUM SILVER 50+MEN PO) Take by mouth.    [provider]  nitroGLYCERIN (NITROSTAT) 0.4 MG SL tablet Place 1 tablet (0.4 mg total) under the tongue every 5 (five) minutes as needed for chest pain. 12/13/19 03/18/21  Netta Neat., NP  rosuvastatin (CRESTOR) 40 MG tablet Take 1 tablet by mouth once daily 07/16/20   Filbert Schilder, NP   Inpatient Medications: Scheduled Meds:  aspirin  81 mg Oral Daily   Budeson-Glycopyrrol-Formoterol  2 puff Inhalation BID   enoxaparin (LOVENOX) injection  40 mg Subcutaneous Q24H   losartan  50 mg Oral Daily   metoprolol tartrate  25 mg Oral BID   rosuvastatin  40 mg Oral Daily   sodium chloride flush  3 mL Intravenous Q12H   Continuous Infusions:  PRN Meds: acetaminophen **OR**  acetaminophen, albuterol, nitroGLYCERIN, ondansetron **OR** ondansetron (ZOFRAN) IV, senna-docusate  Allergies:    Allergies  Allergen Reactions   Penicillins Hives and Rash    Childhood reaction Has patient had a PCN reaction causing immediate rash, facial/tongue/throat swelling, SOB or lightheadedness with hypotension: No PT DEVELOPED ## SEVERE ## RASH INVOLVING MUCUS MEMBRANES or SKIN NECROSIS: #  #  YES  #  # Has patient had a PCN reaction that required hospitalization: Unknown Has patient had a PCN reaction occurring within the last 10 years: No   Clindamycin Rash    Diffuse rash across body   Social History:   Social History   Socioeconomic History   Marital status: Divorced    Spouse name: Not on file   Number of children: Not on file   Years of education: Not on file   Highest education level: Not on file  Occupational History   Not on file  Tobacco Use   Smoking status: Former   Smokeless tobacco: Never  Vaping Use   Vaping Use: Former  Substance and Sexual Activity   Alcohol use: Not Currently  Drug use: No   Sexual activity: Not on file  Other Topics Concern   Not on file  Social History Narrative   Not on file   Social Determinants of Health   Financial Resource Strain: Not on file  Food Insecurity: Not on file  Transportation Needs: Not on file  Physical Activity: Not on file  Stress: Not on file  Social Connections: Not on file  Intimate Partner Violence: Not on file    Family History:    Family History  Adopted: Yes    ROS:  Review of Systems: [y] = yes, [ ]  = no      General: Weight gain [ ] ; Weight loss [ ] ; Anorexia [ ] ; Fatigue [ ] ; Fever [ ] ; Chills [ ] ; Weakness [ ]    Cardiac: Chest pain/pressure [y]; Resting SOB [ ] ; Exertional SOB [ ] ; Orthopnea [ ] ; Pedal Edema [ ] ; Palpitations [ ] ; Syncope [ ] ; Presyncope [ ] ; Paroxysmal nocturnal dyspnea [ ]    Pulmonary: Cough [ ] ; Wheezing [ ] ; Hemoptysis [ ] ; Sputum [ ] ; Snoring [ ]    GI:  Vomiting [ ] ; Dysphagia [ ] ; Melena [ ] ; Hematochezia [ ] ; Heartburn [ ] ; Abdominal pain [ ] ; Constipation [ ] ; Diarrhea [ ] ; BRBPR [ ]    GU: Hematuria [ ] ; Dysuria [ ] ; Nocturia [ ]  Vascular: Pain in legs with walking [ ] ; Pain in feet with lying flat [ ] ; Non-healing sores [ ] ; Stroke [ ] ; TIA [ ] ; Slurred speech [ ] ;   Neuro: Headaches [ ] ; Vertigo [ ] ; Seizures [ ] ; Paresthesias [ ] ;Blurred vision [ ] ; Diplopia [ ] ; Vision changes [ ]    Ortho/Skin: Arthritis [ ] ; Joint pain [ ] ; Muscle pain [ ] ; Joint swelling [ ] ; Back Pain [ ] ; Rash [ ]    Psych: Depression [ ] ; Anxiety [ ]    Heme: Bleeding problems [ ] ; Clotting disorders [ ] ; Anemia [ ]    Endocrine: Diabetes [ ] ; Thyroid dysfunction [ ]    Physical Exam/Data:   Vitals:   04/13/21 2103 04/13/21 2226 04/13/21 2330 04/14/21 0000  BP: 133/71 135/81 (!) 145/70 (!) 155/64  Pulse: 70 70 70 68  Resp: 17 16 15 17   Temp:  97.9 F (36.6 C)    TempSrc:      SpO2: 100% 100% 98% 98%  Weight:    127 kg  Height:    6\' 1"  (1.854 m)   No intake or output data in the 24 hours ending 04/14/21 0108 Last 3 Weights 04/14/2021 03/31/2021 02/19/2021  Weight (lbs) 280 lb 280 lb 287 lb  Weight (kg) 127.007 kg 127.007 kg 130.182 kg     Body mass index is 36.94 kg/m.  General:  Well nourished, well developed, in no acute distress HEENT: normal Lymph: no adenopathy Neck: no JVD Endocrine:  No thryomegaly Vascular: No carotid bruits; FA pulses 2+ bilaterally without bruits  Cardiac:  normal S1, S2; RRR; no murmur  Lungs:  clear to auscultation bilaterally, no wheezing, rhonchi or rales  Abd: soft, nontender, no hepatomegaly  Ext: no edema Musculoskeletal:  No deformities, BUE and BLE strength normal and equal Skin: warm and dry  Neuro:  CNs 2-12 intact, no focal abnormalities noted Psych:  Normal affect   EKG:  The EKG was personally reviewed and demonstrates:  04/13/21 (17:20:34), NSR, ventricular rate 87, PR 264, QRS 68, Qtc 442, 1st deg AVB, no  ischemic changes Telemetry:  Telemetry was personally reviewed and demonstrates: NSR  Relevant CV Studies:  Coronary angiography/PCI Result date: 12/03/19 High bifurcation of the posterior descending artery and posterior lateral artery in the mid RCA. RCA lesion, right at this bifurcation, is 99% stenosed. A drug-eluting stent was successfully placed using a STENT RESOLUTE ONYX 2.5X30, postdilated with a 2.75 balloon, including final kissing balloon angioplasty. Post intervention, there is a 0% residual stenosis. RPAV lesion is 99% stenosed. Thrombectomy was performed. Final, kissing balloon angioplasty was performed using a BALLOON SAPPHIRE 2.25X15. Post intervention, there is a 40% residual stenosis. Prox LAD to Mid LAD lesion is 25% stenosed. Prox RCA lesion is 25% stenosed. The left ventricular systolic function is normal. LV end diastolic pressure is normal. The left ventricular ejection fraction is 55-65% by visual estimate. There is no aortic valve stenosis. Balloon angioplasty was performed using a BALLOON SAPPHIRE 2.25X15. A drug-eluting stent was successfully placed using a STENT RESOLUTE ONYX 2.5X30.  TTE Result date: 12/02/19  1. Left ventricular ejection fraction, by estimation, is 65 to 70%. The  left ventricle has normal function. The left ventricle has no regional  wall motion abnormalities. Left ventricular diastolic parameters were  normal.   2. Right ventricular systolic function is normal. The right ventricular  size is normal. There is normal pulmonary artery systolic pressure.   3. The mitral valve is normal in structure. Trivial mitral valve  regurgitation. No evidence of mitral stenosis.   4. The aortic valve is grossly normal. Aortic valve regurgitation is not  visualized. No aortic stenosis is present.   5. The inferior vena cava is normal in size with greater than 50%  respiratory variability, suggesting right atrial pressure of 3 mmHg.    Laboratory  Data:  High Sensitivity Troponin:   Recent Labs  Lab 04/13/21 1806 04/13/21 2108  TROPONINIHS 17 61*     Chemistry Recent Labs  Lab 04/13/21 1806  NA 134*  K 3.6  CL 100  CO2 27  GLUCOSE 105*  BUN 15  CREATININE 1.10  CALCIUM 9.2  GFRNONAA >60  ANIONGAP 7    No results for input(s): PROT, ALBUMIN, AST, ALT, ALKPHOS, BILITOT in the last 168 hours. Hematology Recent Labs  Lab 04/13/21 1806  WBC 7.9  RBC 4.76  HGB 13.2  HCT 42.2  MCV 88.7  MCH 27.7  MCHC 31.3  RDW 16.0*  PLT 113*   BNPNo results for input(s): BNP, PROBNP in the last 168 hours.  DDimer No results for input(s): DDIMER in the last 168 hours.  Radiology/Studies:  DG Chest 2 View  Result Date: 04/13/2021 CLINICAL DATA:  Chest pain since 3 p.m. EXAM: CHEST - 2 VIEW COMPARISON:  January 18, 2021 FINDINGS: The heart size and mediastinal contours are within normal limits. Chronic bronchitic lung changes. No focal airspace consolidation. No pleural effusion. No pneumothorax. Thoracic spondylosis. IMPRESSION: No active cardiopulmonary disease. Electronically Signed   By: Dahlia Bailiff M.D.   On: 04/13/2021 19:05    TIMI Risk Score for Unstable Angina or Non-ST Elevation MI:   The patient's TIMI risk score is 4, which indicates a 20% risk of all cause mortality, new or recurrent myocardial infarction or need for urgent revascularization in the next 14 days.  Assessment and Plan:   NSTEMI Typical cardiac CP Mr. Fritts presents with 1 episode of mild chest discomfort which alleviated with and is not with hypertensive with systolics in the [id number] on arrival.  Symptoms seem consistent with cardiac etiology with uptrending troponin and similar ECG to from prior comparison.  We discussed different options for management of this and he would preference up titration medical management with no residual disease on his last coronary evaluation in August 2021.  Plan for addition of Imdur 30 mg daily and repeat echo today.   Since he has continued uptrending troponin will also be heparinized as pulm management for his NSTEMI.  Initially had plan to treat conservatively with titration of GDMT however troponin continued to uptrend.  He has BP room for medication uptitration.  His symptoms could be from multiple areas although he had POBA of RPAV last year without stent placement so this may be the culprit lesion. - start imdur 30 mg daily  - continue home meds: crestor 40 mg daily, ASA 81 mg daily, losartan 50 mg daily, metop tartrate 25 mg bid - stopped plavix in 11/2020 after 1 yr of tx for NSTEMI, would reload/resume and tx for repeat NSTEMI   For questions or updates, please contact [facility] Please consult [email or web address] for contact info under   Signed, Dion Body, MD  04/14/2021 1:08 AM

## 2021-04-14 NOTE — H&P (Signed)
History and Physical    Jorge Clements XNT:[id number] DOB: May 29, 1945 DOA: 04/13/2021  PCP: Benita Stabile, MD  Patient coming from: Home  I have personally briefly reviewed patient's old medical records in [facility]  Chief Complaint: Chest pain  HPI: Jorge Clements is a 75 y.o. male with medical history significant for CAD s/p DES to RCA, PAD s/p right femoropopliteal bypass and right transmetatarsal amputation, COPD, HTN, HLD who presented to the ED for evaluation of chest pain.  Patient reports new onset of palpitations and left-sided chest discomfort beginning around 3 PM while he was at rest at home.  He says first he began to feel as if his heart was pounding then developed left-sided pressure-like chest discomfort.  He later began to feel some numbness in his left hand.  He had associated diaphoresis and shortness of breath.  He took 2 nitroglycerin and his son began to drive him to the hospital from [place].  He states that his discomfort resolved on route to the ED.  Patient otherwise denies any nausea, vomiting, abdominal pain, dysuria, diarrhea.  He reports chronic swelling of his right leg related to his PAD which is unchanged from baseline.  ED Course:  Initial vitals showed BP 175/75, pulse 87, RR 19, temp 98.0 F, SPO2 97% on room air.  Labs show WBC 7.9, hemoglobin 13.2, platelets 113,000, sodium 134, potassium 3.6, bicarb 27, BUN 15, creatinine 1.10, serum glucose 105.  High-sensitivity troponin 17 > 61.  Respiratory panel in process.  2 view chest x-ray shows chronic bronchitic lung changes without focal consolidation, edema, effusion.  Patient was given aspirin 324 mg.  EDP discussed with on-call cardiology who will see in consultation and recommended medical admission.  The hospitalist service was consulted to admit for further evaluation and management.  Review of Systems: All systems reviewed and are negative except as documented in history of present  illness above.   Past Medical History:  Diagnosis Date   Abdominal aortic aneurysm    no AAA on [place] in 2020   CAD (coronary artery disease)    Stent to RCA 2021   Chronic obstructive pulmonary disease    HLD (hyperlipidemia)    HTN (hypertension)    Peripheral artery disease    s/p R fem pop // s/p R transmet amp    Past Surgical History:  Procedure Laterality Date   ABDOMINAL AORTOGRAM N/A 08/17/2017   Procedure: ABDOMINAL AORTOGRAM;  Surgeon: Maeola Harman, MD;  Location: [facility] INVASIVE CV LAB;  Service: Cardiovascular;  Laterality: N/A;   AMPUTATION Right 08/21/2017   Procedure: TRANSMETATARSAL AMPUTATION RIGHT FOOT;  Surgeon: Nadara Mustard, MD;  Location: [facility] OR;  Service: Orthopedics;  Laterality: Right;   AMPUTATION TOE Right 08/18/2017   Procedure: AMPUTATION RIGHT SECOND AND THIRD TOES;  Surgeon: Maeola Harman, MD;  Location: [facility] OR;  Service: Vascular;  Laterality: Right;   CORONARY BALLOON ANGIOPLASTY N/A 12/03/2019   Procedure: CORONARY BALLOON ANGIOPLASTY;  Surgeon: Corky Crafts, MD;  Location: [facility] INVASIVE CV LAB;  Service: Cardiovascular;  Laterality: N/A;   CORONARY STENT INTERVENTION N/A 12/03/2019   Procedure: CORONARY STENT INTERVENTION;  Surgeon: Corky Crafts, MD;  Location: [facility] INVASIVE CV LAB;  Service: Cardiovascular;  Laterality: N/A;   ENDARTERECTOMY FEMORAL Right 08/18/2017   Procedure: ENDARTERECTOMY RIGHT SUPERFICIAL Alfredia Ferguson;  Surgeon: Maeola Harman, MD;  Location: [facility] OR;  Service: Vascular;  Laterality: Right;   FEMORAL-POPLITEAL BYPASS GRAFT Right 08/18/2017   Procedure: BYPASS GRAFT RIGHT  COMMON FEMORAL TO ABOVE KNEE POPLITEAL ARTERY USING RIGHT REVERSED GREAT SAPHENOUS VEIN;  Surgeon: Maeola Harman, MD;  Location: [facility] OR;  Service: Vascular;  Laterality: Right;   LEFT HEART CATH AND CORONARY ANGIOGRAPHY N/A 12/03/2019   Procedure: LEFT HEART CATH AND CORONARY ANGIOGRAPHY;  Surgeon: Corky Crafts, MD;  Location: [facility] INVASIVE CV LAB;  Service: Cardiovascular;  Laterality: N/A;   LOWER EXTREMITY ANGIOGRAPHY Right 08/17/2017   Procedure: Lower Extremity Angiography;  Surgeon: Maeola Harman, MD;  Location: [facility] INVASIVE CV LAB;  Service: Cardiovascular;  Laterality: Right;   PERIPHERAL VASCULAR BALLOON ANGIOPLASTY Right 08/17/2017   Procedure: PERIPHERAL VASCULAR BALLOON ANGIOPLASTY;  Surgeon: Maeola Harman, MD;  Location: [facility] INVASIVE CV LAB;  Service: Cardiovascular;  Laterality: Right;  SFA UNABLE TO CROSS   VEIN HARVEST Right 08/18/2017   Procedure: VEIN HARVEST RIGHT GREAT SAPHENOUS;  Surgeon: Maeola Harman, MD;  Location: [facility] OR;  Service: Vascular;  Laterality: Right;   WOUND DEBRIDEMENT Right 08/18/2017   Procedure: DEBRIDEMENT WOUND RIGHT FOOT;  Surgeon: Maeola Harman, MD;  Location: [facility] OR;  Service: Vascular;  Laterality: Right;    Social History:  reports that he has quit smoking. He has never used smokeless tobacco. He reports that he does not currently use alcohol. He reports that he does not use drugs.  Allergies  Allergen Reactions   Penicillins Hives and Rash    Childhood reaction Has patient had a PCN reaction causing immediate rash, facial/tongue/throat swelling, SOB or lightheadedness with hypotension: No PT DEVELOPED ## SEVERE ## RASH INVOLVING MUCUS MEMBRANES or SKIN NECROSIS: #  #  YES  #  # Has patient had a PCN reaction that required hospitalization: Unknown Has patient had a PCN reaction occurring within the last 10 years: No   Clindamycin Rash    Diffuse rash across body    Family History  Adopted: Yes     Prior to Admission medications   Medication Sig Start Date End Date Taking? Authorizing Provider  metoprolol tartrate (LOPRESSOR) 25 MG tablet Take 1 tablet by mouth twice daily 04/06/21   Wendall Stade, MD  Albuterol Sulfate 108 (90 Base) MCG/ACT AEPB Inhale 90 mcg into the lungs.    [provider]   aspirin 81 MG chewable tablet Chew 1 tablet (81 mg total) by mouth daily. 12/04/19   Filbert Schilder, NP  BREZTRI AEROSPHERE 160-9-4.8 MCG/ACT AERO Inhale 2 puffs into the lungs 2 (two) times daily. 07/09/19   [provider]  losartan (COZAAR) 50 MG tablet Take 1 tablet by mouth once daily 12/08/20   Netta Neat., NP  Multiple Vitamins-Minerals (CENTRUM SILVER 50+MEN PO) Take by mouth.    [provider]  nitroGLYCERIN (NITROSTAT) 0.4 MG SL tablet Place 1 tablet (0.4 mg total) under the tongue every 5 (five) minutes as needed for chest pain. 12/13/19 03/18/21  Netta Neat., NP  rosuvastatin (CRESTOR) 40 MG tablet Take 1 tablet by mouth once daily 07/16/20   Filbert Schilder, NP    Physical Exam: Vitals:   04/13/21 2103 04/13/21 2226 04/13/21 2330 04/14/21 0000  BP: 133/71 135/81 (!) 145/70 (!) 155/64  Pulse: 70 70 70 68  Resp: Temp:  97.9 F (36.6 C)    TempSrc:      SpO2: 100% 100% 98% 98%   Constitutional: Resting in bed with head elevated, NAD, calm, comfortable Eyes: PERRL, lids and conjunctivae normal ENMT: Mucous membranes are  moist. Posterior pharynx clear of any exudate or lesions.Normal dentition.  Neck: normal, supple, no masses. Respiratory: End expiratory wheezing bilaterally. Normal respiratory effort. No accessory muscle use.  Cardiovascular: Regular rate and rhythm, no murmurs / rubs / gallops.  +1 right lower extremity edema, no edema left leg. Abdomen: no tenderness, no masses palpated. No hepatosplenomegaly. Bowel sounds positive.  Musculoskeletal: S/p right transmetatarsal amputation.  No clubbing / cyanosis. Good ROM, no contractures. Normal muscle tone.  Skin: Venous stasis dermatitis right lower extremity with yellow and white-colored thick scaling skin changes about the right foot as pictured below. Neurologic: CN 2-12 grossly intact. Sensation intact. Strength 5/5 in all 4.  Psychiatric: Normal judgment and insight.  Alert and oriented x 3. Normal mood.        Labs on Admission: I have personally reviewed following labs and imaging studies  CBC: Recent Labs  Lab 04/13/21 1806  WBC 7.9  HGB 13.2  HCT 42.2  MCV 88.7  PLT 113*   Basic Metabolic Panel: Recent Labs  Lab 04/13/21 1806  NA 134*  K 3.6  CL 100  CO2 27  GLUCOSE 105*  BUN 15  CREATININE 1.10  CALCIUM 9.2   GFR: Estimated Creatinine Clearance: 83.5 mL/min (by C-G formula based on SCr of 1.1 mg/dL). Liver Function Tests: No results for input(s): AST, ALT, ALKPHOS, BILITOT, PROT, ALBUMIN in the last 168 hours. No results for input(s): LIPASE, AMYLASE in the last 168 hours. No results for input(s): AMMONIA in the last 168 hours. Coagulation Profile: No results for input(s): INR, PROTIME in the last 168 hours. Cardiac Enzymes: No results for input(s): CKTOTAL, CKMB, CKMBINDEX, TROPONINI in the last 168 hours. BNP (last 3 results) No results for input(s): PROBNP in the last 8760 hours. HbA1C: No results for input(s): HGBA1C in the last 72 hours. CBG: No results for input(s): GLUCAP in the last 168 hours. Lipid Profile: No results for input(s): CHOL, HDL, LDLCALC, TRIG, CHOLHDL, LDLDIRECT in the last 72 hours. Thyroid Function Tests: No results for input(s): TSH, T4TOTAL, FREET4, T3FREE, THYROIDAB in the last 72 hours. Anemia Panel: No results for input(s): VITAMINB12, FOLATE, FERRITIN, TIBC, IRON, RETICCTPCT in the last 72 hours. Urine analysis:    Component Value Date/Time   COLORURINE YELLOW 08/11/2017 0642   APPEARANCEUR CLEAR 08/11/2017 0642   LABSPEC 1.017 08/11/2017 0642   PHURINE 6.0 08/11/2017 0642   GLUCOSEU NEGATIVE 08/11/2017 0642   HGBUR MODERATE (A) 08/11/2017 0642   BILIRUBINUR NEGATIVE 08/11/2017 0642   KETONESUR 5 (A) 08/11/2017 0642   PROTEINUR 30 (A) 08/11/2017 0642   UROBILINOGEN 0.2 11/17/2013 1301   NITRITE NEGATIVE 08/11/2017 0642   LEUKOCYTESUR NEGATIVE 08/11/2017 0642    Radiological  Exams on Admission: DG Chest 2 View  Result Date: 04/13/2021 CLINICAL DATA:  Chest pain since 3 p.m. EXAM: CHEST - 2 VIEW COMPARISON:  January 18, 2021 FINDINGS: The heart size and mediastinal contours are within normal limits. Chronic bronchitic lung changes. No focal airspace consolidation. No pleural effusion. No pneumothorax. Thoracic spondylosis. IMPRESSION: No active cardiopulmonary disease. Electronically Signed   By: Maudry Mayhew M.D.   On: 04/13/2021 19:05    EKG: Personally reviewed. Normal sinus rhythm, rate 87, first-degree AV block.  Not significantly changed when compared to prior.  Assessment/Plan Principal Problem:   Chest pain Active Problems:   COPD (chronic obstructive pulmonary disease) (HCC)   Thrombocytopenia (HCC)   Essential hypertension   PAD (peripheral artery disease) (HCC)   HLD (hyperlipidemia)   CAD (  coronary artery disease)   Jorge Clements is a 75 y.o. male with medical history significant for CAD s/p DES to RCA, PAD s/p right femoropopliteal bypass and right transmetatarsal amputation, COPD, HTN, HLD who is admitted for evaluation of chest pain.  Chest discomfort/elevated troponin CAD s/p DES to RCA and thrombectomy of RPAV 12/03/2019: Patient presenting with palpitations and left-sided chest pressure occurring while at rest.  Symptoms resolved with sublingual nitroglycerin x2.  EKG unchanged from prior.  Initial troponin 17, repeat mildly elevated at 61.  He is currently chest pain-free. -Cardiology to see -Cycle troponin -Continue aspirin 81 mg daily -Continue Lopressor 25 mg twice daily -Continue rosuvastatin -Nitroglycerin as needed -Update echocardiogram -Will keep n.p.o. for now  COPD: Mild end expiratory wheezing on admission but otherwise stable.  Continue home Breztri and albuterol as needed.  Hypertension: Continue Lopressor and losartan.  PAD s/p right femoropopliteal bypass 08/18/2017: Has chronic right lower extremity edema which is  unchanged from baseline.  Follows with vascular surgery, Dr. Randie Heinz, and orthopedic surgery, Dr. Lowry Bowl. -Continue aspirin and statin  Hyperlipidemia: Continue rosuvastatin.  Thrombocytopenia: Mild without obvious bleeding.  Continue to monitor.  Chronic/recurrent right foot plantar ulcers: Follows with orthopedic surgery, Dr. Lajoyce Corners.  Improved appearance of ulceration compared to prior photographs, no evidence of active infection.  DVT prophylaxis: Lovenox Code Status: Full code, confirmed with patient Family Communication: Discussed with patient, he has discussed with family Disposition Plan: From home and likely discharge to home pending clinical progress Consults called: Cardiology Level of care: Telemetry Cardiac Admission status:  Status is: Observation  The patient remains OBS appropriate and will d/c before 2 midnights.  Darreld Mclean MD [facility]  If 7PM-7AM, please contact night-coverage [email or web address]  04/14/2021, 12:14 AM

## 2021-04-14 NOTE — Progress Notes (Signed)
Patient admitted after midnight, please see H&P.  Plan for heart cath today by cardiology.  Marlin Canary DO

## 2021-04-14 NOTE — ED Notes (Signed)
Pt ambulated to restroom using cane.

## 2021-04-14 NOTE — Progress Notes (Signed)
ANTICOAGULATION CONSULT NOTE - Follow Up Consult  Pharmacy Consult for heparin Indication: chest pain/ACS  Allergies  Allergen Reactions   Penicillins Hives and Rash    Childhood reaction Has patient had a PCN reaction causing immediate rash, facial/tongue/throat swelling, SOB or lightheadedness with hypotension: No PT DEVELOPED ## SEVERE ## RASH INVOLVING MUCUS MEMBRANES or SKIN NECROSIS: #  #  YES  #  # Has patient had a PCN reaction that required hospitalization: Unknown Has patient had a PCN reaction occurring within the last 10 years: No   Clindamycin Rash    Diffuse rash across body    Patient Measurements: Height: 6\' 1"  (185.4 cm) Weight: 127 kg (280 lb) IBW/kg (Calculated) : 79.9 Heparin Dosing Weight: 110kg  Vital Signs: Temp: 97.9 F (36.6 C) (12/26 2226) BP: 160/64 (12/27 0630) Pulse Rate: 63 (12/27 0630)  Labs: Recent Labs    04/13/21 1806 04/13/21 2108 04/14/21 0111 04/14/21 0525  HGB 13.2  --  13.3  --   HCT 42.2  --  41.9  --   PLT 113*  --  101*  --   CREATININE 1.10  --  1.07  --   TROPONINIHS 17 61*  --  94*     Estimated Creatinine Clearance: 85.8 mL/min (by C-G formula based on SCr of 1.07 mg/dL).   Assessment: 75yo male c/o CP associated w/ dizziness. Troponin initially negative but now rising. No PTA Ac. Pharmacy consulted to started heparin.  Goal of Therapy:  Heparin level 0.3-0.7 units/ml Monitor platelets by anticoagulation protocol: Yes   Plan:  Heparin 4000 units IV bolus x1  Heparin IV infusion 1600 units/hr (based on prior heparin requirements) 8 h heparin level  daily heparin levels and CBC  Thank you for allowing pharmacy to participate in this patient's care.  75yo, PharmD PGY1 Acute Care Resident  04/14/2021,7:13 AM

## 2021-04-15 ENCOUNTER — Encounter (HOSPITAL_COMMUNITY): Payer: Self-pay | Admitting: Interventional Cardiology

## 2021-04-15 DIAGNOSIS — E782 Mixed hyperlipidemia: Secondary | ICD-10-CM

## 2021-04-15 DIAGNOSIS — I200 Unstable angina: Secondary | ICD-10-CM | POA: Diagnosis not present

## 2021-04-15 DIAGNOSIS — I10 Essential (primary) hypertension: Secondary | ICD-10-CM

## 2021-04-15 DIAGNOSIS — I2510 Atherosclerotic heart disease of native coronary artery without angina pectoris: Secondary | ICD-10-CM | POA: Diagnosis not present

## 2021-04-15 LAB — CBC
HCT: 38.8 % — ABNORMAL LOW (ref 39.0–52.0)
Hemoglobin: 12.3 g/dL — ABNORMAL LOW (ref 13.0–17.0)
MCH: 27.8 pg (ref 26.0–34.0)
MCHC: 31.7 g/dL (ref 30.0–36.0)
MCV: 87.8 fL (ref 80.0–100.0)
Platelets: 96 10*3/uL — ABNORMAL LOW (ref 150–400)
RBC: 4.42 MIL/uL (ref 4.22–5.81)
RDW: 16.3 % — ABNORMAL HIGH (ref 11.5–15.5)
WBC: 6.1 10*3/uL (ref 4.0–10.5)
nRBC: 0 % (ref 0.0–0.2)

## 2021-04-15 LAB — BASIC METABOLIC PANEL
Anion gap: 9 (ref 5–15)
BUN: 15 mg/dL (ref 8–23)
CO2: 25 mmol/L (ref 22–32)
Calcium: 8.8 mg/dL — ABNORMAL LOW (ref 8.9–10.3)
Chloride: 101 mmol/L (ref 98–111)
Creatinine, Ser: 1 mg/dL (ref 0.61–1.24)
GFR, Estimated: >60 mL/min (ref >60)
Glucose, Bld: 111 mg/dL — ABNORMAL HIGH (ref 70–99)
Potassium: 3.7 mmol/L (ref 3.5–5.1)
Sodium: 135 mmol/L (ref 135–145)

## 2021-04-15 MED ORDER — ISOSORBIDE MONONITRATE ER 30 MG PO TB24: 30.0000 mg | ORAL_TABLET | Freq: Every day | ORAL | 1 refills | Status: DC

## 2021-04-15 MED ORDER — CLOPIDOGREL BISULFATE 75 MG PO TABS: 75.0000 mg | ORAL_TABLET | Freq: Every day | ORAL | 1 refills | Status: DC

## 2021-04-15 NOTE — Care Management (Signed)
°  Transition of Care [facility]) Screening Note   Patient Details  Name: Jorge Clements Date of Birth: 25-Sep-1945   [facility]) CM/SW Contact:    Lawerance Sabal, RN Phone Number: 04/15/2021, 7:49 AM    Transition of Care Department [facility]) has reviewed patient we will continue to monitor patient advancement through interdisciplinary progression rounds. If new patient transition needs arise, please place a TOC consult.

## 2021-04-15 NOTE — Progress Notes (Signed)
Progress Note  Patient Name: Winifred Balogh Date of Encounter: 04/15/2021  [facility] Cardiologist: Charlton Haws, MD    Subjective   75 year old gentleman who was admitted to the hospital with progressive chest pain.  He has a history of coronary artery disease in the past.  Heart catheterization yesterday revealed an acute marginal branch with a 75% stenosis.  This is more acute marginal branch is jailed by an RCA stent.  There is TIMI III flow down the vessel.  He only had mild irregularities otherwise. The plan was to continue with aggressive treatment of his hypertension and aggressive secondary prevention. Troponin peaked at 94.  Inpatient Medications    Scheduled Meds:  aspirin  81 mg Oral Daily   clopidogrel  75 mg Oral Daily   isosorbide mononitrate  30 mg Oral Daily   losartan  50 mg Oral Daily   metoprolol tartrate  25 mg Oral BID   umeclidinium bromide  1 puff Inhalation Daily   And   mometasone-formoterol  2 puff Inhalation BID   rosuvastatin  40 mg Oral Daily   sodium chloride flush  3 mL Intravenous Q12H   Continuous Infusions:  sodium chloride     PRN Meds: sodium chloride, acetaminophen, albuterol, nitroGLYCERIN, ondansetron (ZOFRAN) IV, senna-docusate, sodium chloride flush   Vital Signs    Vitals:   04/14/21 2051 04/15/21 0015 04/15/21 0307 04/15/21 0744  BP:  (!) 146/47 (!) 140/54   Pulse:  63 68   Resp:  18 18   Temp:  98 F (36.7 C) (!) 97.4 F (36.3 C)   TempSrc:  Oral Oral   SpO2: 96% 100% 100% 99%  Weight:      Height:        Intake/Output Summary (Last 24 hours) at 04/15/2021 0909 Last data filed at 04/15/2021 0308 Gross per 24 hour  Intake 541.32 ml  Output 1 ml  Net 540.32 ml   Last 3 Weights 04/14/2021 03/31/2021 02/19/2021  Weight (lbs) 280 lb 280 lb 287 lb  Weight (kg) 127.007 kg 127.007 kg 130.182 kg      Telemetry    Sinus rhythm  - Personally Reviewed  ECG     - Personally Reviewed  Physical Exam   GEN:  No acute distress.   Neck: No JVD Cardiac: RRR, no murmurs, rubs, or gallops.  Respiratory: Clear to auscultation bilaterally. GI: Soft, nontender, non-distended  MS: No edema; No deformity.  Right radial cath site looks good.  His distal pulses good. Neuro:  Nonfocal  Psych: Normal affect   Labs    High Sensitivity Troponin:   Recent Labs  Lab 04/13/21 1806 04/13/21 2108 04/14/21 0525  TROPONINIHS 17 61* 94*     Chemistry Recent Labs  Lab 04/13/21 1806 04/14/21 0111 04/15/21 0345  NA 134* 135 135  K 3.6 3.9 3.7  CL 100 102 101  CO2 27 26 25   GLUCOSE 105* 113* 111*  BUN 15 14 15   CREATININE 1.10 1.07 1.00  CALCIUM 9.2 9.3 8.8*  GFRNONAA >60 >60 >60  ANIONGAP 7 7 9     Lipids No results for input(s): CHOL, TRIG, HDL, LABVLDL, LDLCALC, CHOLHDL in the last 168 hours.  Hematology Recent Labs  Lab 04/13/21 1806 04/14/21 0111 04/15/21 0345  WBC 7.9 7.6 6.1  RBC 4.76 4.67 4.42  HGB 13.2 13.3 12.3*  HCT 42.2 41.9 38.8*  MCV 88.7 89.7 87.8  MCH 27.7 28.5 27.8  MCHC 31.3 31.7 31.7  RDW 16.0* 15.9* 16.3*  PLT 113* 101* 96*   Thyroid No results for input(s): TSH, FREET4 in the last 168 hours.  BNPNo results for input(s): BNP, PROBNP in the last 168 hours.  DDimer No results for input(s): DDIMER in the last 168 hours.   Radiology    DG Chest 2 View  Result Date: 04/13/2021 CLINICAL DATA:  Chest pain since 3 p.m. EXAM: CHEST - 2 VIEW COMPARISON:  January 18, 2021 FINDINGS: The heart size and mediastinal contours are within normal limits. Chronic bronchitic lung changes. No focal airspace consolidation. No pleural effusion. No pneumothorax. Thoracic spondylosis. IMPRESSION: No active cardiopulmonary disease. Electronically Signed   By: Maudry Mayhew M.D.   On: 04/13/2021 19:05   CARDIAC CATHETERIZATION  Result Date: 04/14/2021   Prox RCA lesion is 25% stenosed.   Acute Mrg lesion is 75% stenosed.  Jailed by RCA stent.  TIMI 3 flow.   Prox LAD to Mid LAD lesion is  25% stenosed.   1st Mrg lesion is 25% stenosed.   Previously placed Mid RCA to Dist RCA stent is widely patent.   The left ventricular systolic function is normal.   LV end diastolic pressure is normal.   The left ventricular ejection fraction is 55-65% by visual estimate.   There is no aortic valve stenosis. Only obstructive disease is in a marginal branch of the RCA that was jailed by the previous stent.  Risk of intervention will be higher than benefit.  Would encourage medical therapy.  No significant disease in the main vessels.  Right PDA is much larger on today's cath than it was at the time of his non-STEMI in 2021. I suspect his minimally elevated troponin was demand ischemia from his high blood pressure.  Continue aggressive secondary prevention.  Given his history of PAD as well as CAD, ontinuing clopidogrel may be reasonable if he does not have significant bleeding issues.   ECHOCARDIOGRAM COMPLETE  Result Date: 04/14/2021    ECHOCARDIOGRAM REPORT   Patient Name:   JAISON PETRAGLIA Date of Exam: 04/14/2021 Medical Rec #:  [id number]      Height:       73.0 in Accession #:    [id number]     Weight:       280.0 lb Date of Birth:  1945/05/28       BSA:          2.482 m Patient Age:    75 years       BP:           156/63 mmHg Patient Gender: M              HR:           67 bpm. Exam Location:  Inpatient Procedure: 2D Echo, Cardiac Doppler, Color Doppler and Intracardiac            Opacification Agent Indications:    R07.9* Chest pain, unspecified  History:        Patient has prior history of Echocardiogram examinations, most                 recent 12/02/2019. CAD and Previous Myocardial Infarction, COPD;                 Risk Factors:Hypertension.  Sonographer:    Vanetta Shawl Referring Phys: [id number] VISHAL R PATEL IMPRESSIONS  1. Left ventricular ejection fraction, by estimation, is 60 to 65%. The left ventricle has normal function. The left ventricle has no regional wall motion abnormalities. There  is  mild left ventricular hypertrophy. Left ventricular diastolic parameters were normal.  2. Right ventricular systolic function is normal. The right ventricular size is normal.  3. Left atrial size was mildly dilated.  4. The mitral valve is normal in structure. No evidence of mitral valve regurgitation. No evidence of mitral stenosis.  5. The aortic valve was not well visualized. Aortic valve regurgitation is not visualized. No aortic stenosis is present.  6. The inferior vena cava is normal in size with greater than 50% respiratory variability, suggesting right atrial pressure of 3 mmHg. FINDINGS  Left Ventricle: Left ventricular ejection fraction, by estimation, is 60 to 65%. The left ventricle has normal function. The left ventricle has no regional wall motion abnormalities. The left ventricular internal cavity size was normal in size. There is  mild left ventricular hypertrophy. Left ventricular diastolic parameters were normal. Right Ventricle: The right ventricular size is normal. No increase in right ventricular wall thickness. Right ventricular systolic function is normal. Left Atrium: Left atrial size was mildly dilated. Right Atrium: Right atrial size was normal in size. Pericardium: There is no evidence of pericardial effusion. Mitral Valve: The mitral valve is normal in structure. No evidence of mitral valve regurgitation. No evidence of mitral valve stenosis. Tricuspid Valve: The tricuspid valve is normal in structure. Tricuspid valve regurgitation is mild . No evidence of tricuspid stenosis. Aortic Valve: The aortic valve was not well visualized. Aortic valve regurgitation is not visualized. No aortic stenosis is present. Pulmonic Valve: The pulmonic valve was normal in structure. Pulmonic valve regurgitation is not visualized. No evidence of pulmonic stenosis. Aorta: The aortic root is normal in size and structure. Venous: The inferior vena cava is normal in size with greater than 50% respiratory  variability, suggesting right atrial pressure of 3 mmHg. IAS/Shunts: No atrial level shunt detected by color flow Doppler.  LEFT VENTRICLE PLAX 2D LVIDd:         3.70 cm   Diastology LVIDs:         2.60 cm   LV e' medial:    6.85 cm/s LV PW:         1.40 cm   LV E/e' medial:  16.8 LV IVS:        1.40 cm   LV e' lateral:   8.59 cm/s LVOT diam:     2.00 cm   LV E/e' lateral: 13.4 LVOT Area:     3.14 cm  RIGHT VENTRICLE             IVC RV S prime:     16.80 cm/s  IVC diam: 2.60 cm LEFT ATRIUM         Index LA diam:    3.80 cm 1.53 cm/m                        PULMONIC VALVE AORTA                 PV Vmax:       1.05 m/s Ao Root diam: 3.60 cm PV Peak grad:  4.4 mmHg Ao Asc diam:  3.10 cm  MITRAL VALVE MV Area (PHT): 3.45 cm     SHUNTS MV Decel Time: 220 msec     Systemic Diam: 2.00 cm MV E velocity: 115.00 cm/s MV A velocity: 86.30 cm/s MV E/A ratio:  1.33 Charlton Haws MD Electronically signed by Charlton Haws MD Signature Date/Time: 04/14/2021/10:05:13 AM    Final  Cardiac Studies     Patient Profile     75 y.o. male   Assessment & Plan    1.  Unstable angina: Kaladin had a heart catheterization yesterday which revealed mild coronary artery irregularities.  An acute marginal branch off of the right coronary artery is jailed by previously placed stent and has a 75% stenosis.  This vessel is relatively small and would be at high risk to try and intervention.  I agree that our best option is to continue with medical therapy. Isosorbide has been added to his medical regimen.  In addition, I have stopped his aspirin and have added Plavix 75 mg a day.  He will follow-up with Dr. Eden Emms or an APP in a month or so.  2.  Hyperlipidemia: He is currently on rosuvastatin 40 mg a day.  His last LDL is 34.  He 8 his HDL is 29.  Total cholesterol is 90.  For questions or updates, please contact [facility] Please consult [email or web address] for contact info under        Signed, Kristeen Miss, MD  04/15/2021,  9:09 AM

## 2021-04-15 NOTE — Care Management Obs Status (Signed)
MEDICARE OBSERVATION STATUS NOTIFICATION   Patient Details  Name: Jorge Clements MRN: [id number] Date of Birth: Nov 11, 1945   Medicare Observation Status Notification Given:  Yes    Lawerance Sabal, RN 04/15/2021, 9:44 AM

## 2021-04-15 NOTE — Discharge Summary (Signed)
Physician Discharge Summary  Jorge Clements ZOX:[id number] DOB: 1945-12-04 DOA: 04/13/2021  PCP: Benita Stabile, MD  Admit date: 04/13/2021 Discharge date: 04/15/2021  Admitted From: home Discharge disposition: home   Recommendations for Outpatient Follow-Up:   Follow up with cards in 1 month New meds: plavix, isosorbide   Discharge Diagnosis:   Principal Problem:   Chest pain Active Problems:   COPD (chronic obstructive pulmonary disease) (HCC)   Thrombocytopenia (HCC)   Essential hypertension   PAD (peripheral artery disease) (HCC)   HLD (hyperlipidemia)   CAD (coronary artery disease)    Discharge Condition: Improved.  Diet recommendation: Low sodium, heart healthy.  Wound care: None.  Code status: Full.   History of Present Illness:   Jorge Clements is a 75 y.o. male with medical history significant for CAD s/p DES to RCA, PAD s/p right femoropopliteal bypass and right transmetatarsal amputation, COPD, HTN, HLD who presented to the ED for evaluation of chest pain.  Patient reports new onset of palpitations and left-sided chest discomfort beginning around 3 PM while he was at rest at home.  He says first he began to feel as if his heart was pounding then developed left-sided pressure-like chest discomfort.  He later began to feel some numbness in his left hand.  He had associated diaphoresis and shortness of breath.  He took 2 nitroglycerin and his son began to drive him to the hospital from [place].  He states that his discomfort resolved on route to the ED.  Patient otherwise denies any nausea, vomiting, abdominal pain, dysuria, diarrhea.  He reports chronic swelling of his right leg related to his PAD which is unchanged from baseline.     Hospital Course by Problem:   Unstable angina:  -heart catheterization yesterday which revealed mild coronary artery irregularities.  An acute marginal branch off of the right coronary artery is jailed by  previously placed stent and has a 75% stenosis.  This vessel is relatively small and would be at high risk to try and intervention.  I -per cards:  best option is to continue with medical therapy. Isosorbide has been added to his medical regimen.  In addition, ASA stopped and plavix added   Hyperlipidemia:  -on rosuvastatin 40 mg a day.  His last LDL is 34.  He 8 his HDL is 29.  Total cholesterol is 90.  PAD s/p right femoropopliteal bypass 08/18/2017: Has chronic right lower extremity edema which is unchanged from baseline.  Follows with vascular surgery, Dr. Randie Heinz, and orthopedic surgery, Dr. Lowry Bowl. -Continue plavix and statin   Thrombocytopenia: Mild without obvious bleeding.  Continue to monitor.   Chronic/recurrent right foot plantar ulcers: Follows with orthopedic surgery, Dr. Lajoyce Corners.  Improved appearance of ulceration compared to prior photographs, no evidence of active infection.  Medical Consultants:    cards  Discharge Exam:   Vitals:   04/15/21 0307 04/15/21 0744  BP: (!) 140/54   Pulse: 68   Resp: 18   Temp: (!) 97.4 F (36.3 C)   SpO2: 100% 99%   Vitals:   04/14/21 2051 04/15/21 0015 04/15/21 0307 04/15/21 0744  BP:  (!) 146/47 (!) 140/54   Pulse:  63 68   Resp:  18 18   Temp:  98 F (36.7 C) (!) 97.4 F (36.3 C)   TempSrc:  Oral Oral   SpO2: 96% 100% 100% 99%  Weight:      Height:        General exam: Appears  calm and comfortable.    The results of significant diagnostics from this hospitalization (including imaging, microbiology, ancillary and laboratory) are listed below for reference.     Procedures and Diagnostic Studies:   DG Chest 2 View  Result Date: 04/13/2021 CLINICAL DATA:  Chest pain since 3 p.m. EXAM: CHEST - 2 VIEW COMPARISON:  January 18, 2021 FINDINGS: The heart size and mediastinal contours are within normal limits. Chronic bronchitic lung changes. No focal airspace consolidation. No pleural effusion. No pneumothorax. Thoracic  spondylosis. IMPRESSION: No active cardiopulmonary disease. Electronically Signed   By: Maudry Mayhew M.D.   On: 04/13/2021 19:05   CARDIAC CATHETERIZATION  Result Date: 04/14/2021   Prox RCA lesion is 25% stenosed.   Acute Mrg lesion is 75% stenosed.  Jailed by RCA stent.  TIMI 3 flow.   Prox LAD to Mid LAD lesion is 25% stenosed.   1st Mrg lesion is 25% stenosed.   Previously placed Mid RCA to Dist RCA stent is widely patent.   The left ventricular systolic function is normal.   LV end diastolic pressure is normal.   The left ventricular ejection fraction is 55-65% by visual estimate.   There is no aortic valve stenosis. Only obstructive disease is in a marginal branch of the RCA that was jailed by the previous stent.  Risk of intervention will be higher than benefit.  Would encourage medical therapy.  No significant disease in the main vessels.  Right PDA is much larger on today's cath than it was at the time of his non-STEMI in 2021. I suspect his minimally elevated troponin was demand ischemia from his high blood pressure.  Continue aggressive secondary prevention.  Given his history of PAD as well as CAD, ontinuing clopidogrel may be reasonable if he does not have significant bleeding issues.   ECHOCARDIOGRAM COMPLETE  Result Date: 04/14/2021    ECHOCARDIOGRAM REPORT   Patient Name:   Jorge Clements Date of Exam: 04/14/2021 Medical Rec #:  [id number]      Height:       73.0 in Accession #:    [id number]     Weight:       280.0 lb Date of Birth:  10/25/1945       BSA:          2.482 m Patient Age:    75 years       BP:           156/63 mmHg Patient Gender: M              HR:           67 bpm. Exam Location:  Inpatient Procedure: 2D Echo, Cardiac Doppler, Color Doppler and Intracardiac            Opacification Agent Indications:    R07.9* Chest pain, unspecified  History:        Patient has prior history of Echocardiogram examinations, most                 recent 12/02/2019. CAD and Previous Myocardial  Infarction, COPD;                 Risk Factors:Hypertension.  Sonographer:    Vanetta Shawl Referring Phys: [id number] VISHAL R PATEL IMPRESSIONS  1. Left ventricular ejection fraction, by estimation, is 60 to 65%. The left ventricle has normal function. The left ventricle has no regional wall motion abnormalities. There is mild left ventricular hypertrophy. Left ventricular diastolic parameters were normal.  2. Right ventricular systolic function is normal. The right ventricular size is normal.  3. Left atrial size was mildly dilated.  4. The mitral valve is normal in structure. No evidence of mitral valve regurgitation. No evidence of mitral stenosis.  5. The aortic valve was not well visualized. Aortic valve regurgitation is not visualized. No aortic stenosis is present.  6. The inferior vena cava is normal in size with greater than 50% respiratory variability, suggesting right atrial pressure of 3 mmHg. FINDINGS  Left Ventricle: Left ventricular ejection fraction, by estimation, is 60 to 65%. The left ventricle has normal function. The left ventricle has no regional wall motion abnormalities. The left ventricular internal cavity size was normal in size. There is  mild left ventricular hypertrophy. Left ventricular diastolic parameters were normal. Right Ventricle: The right ventricular size is normal. No increase in right ventricular wall thickness. Right ventricular systolic function is normal. Left Atrium: Left atrial size was mildly dilated. Right Atrium: Right atrial size was normal in size. Pericardium: There is no evidence of pericardial effusion. Mitral Valve: The mitral valve is normal in structure. No evidence of mitral valve regurgitation. No evidence of mitral valve stenosis. Tricuspid Valve: The tricuspid valve is normal in structure. Tricuspid valve regurgitation is mild . No evidence of tricuspid stenosis. Aortic Valve: The aortic valve was not well visualized. Aortic valve regurgitation is not  visualized. No aortic stenosis is present. Pulmonic Valve: The pulmonic valve was normal in structure. Pulmonic valve regurgitation is not visualized. No evidence of pulmonic stenosis. Aorta: The aortic root is normal in size and structure. Venous: The inferior vena cava is normal in size with greater than 50% respiratory variability, suggesting right atrial pressure of 3 mmHg. IAS/Shunts: No atrial level shunt detected by color flow Doppler.  LEFT VENTRICLE PLAX 2D LVIDd:         3.70 cm   Diastology LVIDs:         2.60 cm   LV e' medial:    6.85 cm/s LV PW:         1.40 cm   LV E/e' medial:  16.8 LV IVS:        1.40 cm   LV e' lateral:   8.59 cm/s LVOT diam:     2.00 cm   LV E/e' lateral: 13.4 LVOT Area:     3.14 cm  RIGHT VENTRICLE             IVC RV S prime:     16.80 cm/s  IVC diam: 2.60 cm LEFT ATRIUM         Index LA diam:    3.80 cm 1.53 cm/m                        PULMONIC VALVE AORTA                 PV Vmax:       1.05 m/s Ao Root diam: 3.60 cm PV Peak grad:  4.4 mmHg Ao Asc diam:  3.10 cm  MITRAL VALVE MV Area (PHT): 3.45 cm     SHUNTS MV Decel Time: 220 msec     Systemic Diam: 2.00 cm MV E velocity: 115.00 cm/s MV A velocity: 86.30 cm/s MV E/A ratio:  1.33 Charlton Haws MD Electronically signed by Charlton Haws MD Signature Date/Time: 04/14/2021/10:05:13 AM    Final      Labs:   Basic Metabolic Panel: Recent Labs  Lab 04/13/21  1806 04/14/21 0111 04/15/21 0345  NA 134* 135 135  K 3.6 3.9 3.7  CL 100 102 101  CO2 27 26 25   GLUCOSE 105* 113* 111*  BUN 15 14 15   CREATININE 1.10 1.07 1.00  CALCIUM 9.2 9.3 8.8*   GFR Estimated Creatinine Clearance: 91.8 mL/min (by C-G formula based on SCr of 1 mg/dL). Liver Function Tests: No results for input(s): AST, ALT, ALKPHOS, BILITOT, PROT, ALBUMIN in the last 168 hours. No results for input(s): LIPASE, AMYLASE in the last 168 hours. No results for input(s): AMMONIA in the last 168 hours. Coagulation profile No results for input(s): INR,  PROTIME in the last 168 hours.  CBC: Recent Labs  Lab 04/13/21 1806 04/14/21 0111 04/15/21 0345  WBC 7.9 7.6 6.1  HGB 13.2 13.3 12.3*  HCT 42.2 41.9 38.8*  MCV 88.7 89.7 87.8  PLT 113* 101* 96*   Cardiac Enzymes: No results for input(s): CKTOTAL, CKMB, CKMBINDEX, TROPONINI in the last 168 hours. BNP: Invalid input(s): POCBNP CBG: No results for input(s): GLUCAP in the last 168 hours. D-Dimer No results for input(s): DDIMER in the last 72 hours. Hgb A1c No results for input(s): HGBA1C in the last 72 hours. Lipid Profile No results for input(s): CHOL, HDL, LDLCALC, TRIG, CHOLHDL, LDLDIRECT in the last 72 hours. Thyroid function studies No results for input(s): TSH, T4TOTAL, T3FREE, THYROIDAB in the last 72 hours.  Invalid input(s): FREET3 Anemia work up No results for input(s): VITAMINB12, FOLATE, FERRITIN, TIBC, IRON, RETICCTPCT in the last 72 hours. Microbiology Recent Results (from the past 240 hour(s))  Resp Panel by RT-PCR (Flu A&B, Covid) Nasopharyngeal Swab     Status: None   Collection Time: 04/13/21 11:46 PM   Specimen: Nasopharyngeal Swab; Nasopharyngeal(NP) swabs in vial transport medium  Result Value Ref Range Status   SARS Coronavirus 2 by RT PCR NEGATIVE NEGATIVE Final    Comment: (NOTE) SARS-CoV-2 target nucleic acids are NOT DETECTED.  The SARS-CoV-2 RNA is generally detectable in upper respiratory specimens during the acute phase of infection. The lowest concentration of SARS-CoV-2 viral copies this assay can detect is 138 copies/mL. A negative result does not preclude SARS-Cov-2 infection and should not be used as the sole basis for treatment or other patient management decisions. A negative result may occur with  improper specimen collection/handling, submission of specimen other than nasopharyngeal swab, presence of viral mutation(s) within the areas targeted by this assay, and inadequate number of viral copies(<138 copies/mL). A negative result  must be combined with clinical observations, patient history, and epidemiological information. The expected result is Negative.  Fact Sheet for Patients:  04/17/21  Fact Sheet for Healthcare Providers:  04/15/21  This test is no t yet approved or cleared by the [employer] FDA and  has been authorized for detection and/or diagnosis of SARS-CoV-2 by FDA under an Emergency Use Authorization (EUA). This EUA will remain  in effect (meaning this test can be used) for the duration of the COVID-19 declaration under Section 564(b)(1) of the Act, 21 U.S.C.section 360bbb-3(b)(1), unless the authorization is terminated  or revoked sooner.       Influenza A by PCR NEGATIVE NEGATIVE Final   Influenza B by PCR NEGATIVE NEGATIVE Final    Comment: (NOTE) The Xpert Xpress SARS-CoV-2/FLU/RSV plus assay is intended as an aid in the diagnosis of influenza from Nasopharyngeal swab specimens and should not be used as a sole basis for treatment. Nasal washings and aspirates are unacceptable for Xpert Xpress SARS-CoV-2/FLU/RSV testing.  Fact Sheet for Patients: [employer]  Fact Sheet for Healthcare Providers: [employer]ker[employer]it  This test is not yet approved or cleared by the [place] FDA and has been authorized for detection and/or diagnosis of SARS-CoV-2 by FDA under an Emergency Use Authorization (EUA). This EUA will remain in effect (meaning this test can be used) for the duration of the COVID-19 declaration under Section 564(b)(1) of the Act, 21 U.S.C. section 360bbb-3(b)(1), unless the authorization is terminated or revoked.  Performed at [facility], [address] [place], [place] [address]      Discharge Instructions:   Discharge Instructions     Diet - low sodium heart healthy   Complete by: As directed    Increase activity slowly   Complete  by: As directed       Allergies as of 04/15/2021       Reactions   Penicillins Hives, Rash   Childhood reaction Has patient had a PCN reaction causing immediate rash, facial/tongue/throat swelling, SOB or lightheadedness with hypotension: No PT DEVELOPED ## SEVERE ## RASH INVOLVING MUCUS MEMBRANES or SKIN NECROSIS: #  #  YES  #  # Has patient had a PCN reaction that required hospitalization: Unknown Has patient had a PCN reaction occurring within the last 10 years: No   Clindamycin Rash   Diffuse rash across body        Medication List     STOP taking these medications    aspirin 81 MG chewable tablet       TAKE these medications    Breztri Aerosphere 160-9-4.8 MCG/ACT Aero Generic drug: Budeson-Glycopyrrol-Formoterol Inhale 2 puffs into the lungs 2 (two) times daily.   CENTRUM SILVER 50+MEN PO Take 1 tablet by mouth daily.   clopidogrel 75 MG tablet Commonly known as: PLAVIX Take 1 tablet (75 mg total) by mouth daily. Start taking on: April 16, 2021   isosorbide mononitrate 30 MG 24 hr tablet Commonly known as: IMDUR Take 1 tablet (30 mg total) by mouth daily. Start taking on: April 16, 2021   losartan 50 MG tablet Commonly known as: COZAAR Take 1 tablet by mouth once daily   metoprolol tartrate 25 MG tablet Commonly known as: LOPRESSOR Take 1 tablet by mouth twice daily   nitroGLYCERIN 0.4 MG SL tablet Commonly known as: NITROSTAT Place 1 tablet (0.4 mg total) under the tongue every 5 (five) minutes as needed for chest pain.   rosuvastatin 40 MG tablet Commonly known as: CRESTOR Take 1 tablet by mouth once daily        Follow-up Information     Benita Stabile, MD Follow up.   Specialty: Internal Medicine Contact information: [address] [place] [facility] [address] [phone]         Wendall Stade, MD .   Specialty: Cardiology Contact information: [phone] [address] [place] [address] [phone]                   Time coordinating discharge: 25 min  Signed:  Joseph Art DO  [facility] 04/15/2021, 11:36 AM

## 2021-04-30 ENCOUNTER — Ambulatory Visit (INDEPENDENT_AMBULATORY_CARE_PROVIDER_SITE_OTHER): Payer: Medicare HMO | Admitting: Orthopedic Surgery

## 2021-04-30 ENCOUNTER — Other Ambulatory Visit: Payer: Self-pay

## 2021-04-30 DIAGNOSIS — Z89431 Acquired absence of right foot: Secondary | ICD-10-CM

## 2021-04-30 DIAGNOSIS — L97511 Non-pressure chronic ulcer of other part of right foot limited to breakdown of skin: Secondary | ICD-10-CM

## 2021-05-01 ENCOUNTER — Encounter: Payer: Self-pay | Admitting: Orthopedic Surgery

## 2021-05-01 NOTE — Progress Notes (Signed)
Office Visit Note   Patient: Jorge Clements           Date of Birth: 1945-04-29           MRN: [id number] Visit Date: 04/30/2021              Requested by: Benita Stabile, MD [address] [place],  [place] [address] PCP: Benita Stabile, MD  Chief Complaint  Patient presents with   Right Foot - Follow-up    Hx transmet amputation       HPI: Patient is a 76 year old gentleman who is several years status post right transmetatarsal amputation he has been wearing compression socks but still has swelling in the residual foot.  Assessment & Plan: Visit Diagnoses:  1. Non-pressure chronic ulcer of other part of right foot limited to breakdown of skin (HCC)   2. History of transmetatarsal amputation of right foot (HCC)     Plan: Recommended continue with compression stockings exercise to work the calf pump elevation to decrease swelling.  Follow-Up Instructions: Return in about 4 weeks (around 05/28/2021).   Ortho Exam  Patient is alert, oriented, no adenopathy, well-dressed, normal affect, normal respiratory effort. Examination the dermatitis from swelling is slowly improving there was a very small superficial ulcers that were touched with silver nitrate.  There is no cellulitis no odor no drainage no exposed bone or tendon.  Imaging: No results found. No images are attached to the encounter.  Labs: Lab Results  Component Value Date   HGBA1C 6.4 (H) 12/02/2019   ESRSEDRATE 28 (H) 01/18/2021   ESRSEDRATE 40 (H) 12/24/2020   CRP 11.1 (H) 01/18/2021   CRP 1.9 (H) 12/24/2020   REPTSTATUS 12/27/2020 FINAL 12/24/2020   GRAMSTAIN NO WBC SEEN NO ORGANISMS SEEN  12/24/2020   CULT  12/24/2020    FEW STAPHYLOCOCCUS AUREUS WITHIN MIXED ORGANISMS Performed at [facility], [address], [place], [place] [address]    [facility] STAPHYLOCOCCUS AUREUS 12/24/2020     Lab Results  Component Value Date   ALBUMIN 3.9 12/01/2019   ALBUMIN 3.7 09/26/2017   ALBUMIN 2.3 (L)  08/17/2017    Lab Results  Component Value Date   MG 2.2 08/17/2017   MG 1.8 08/15/2017   No results found for: VD25OH  No results found for: PREALBUMIN CBC EXTENDED Latest Ref Rng & Units 04/15/2021 04/14/2021 04/13/2021  WBC 4.0 - 10.5 K/uL 6.1 7.6 7.9  RBC 4.22 - 5.81 MIL/uL 4.42 4.67 4.76  HGB 13.0 - 17.0 g/dL 12.3(L) 13.3 13.2  HCT 39.0 - 52.0 % 38.8(L) 41.9 42.2  PLT 150 - 400 K/uL 96(L) 101(L) 113(L)  NEUTROABS 1.7 - 7.7 K/uL - - -  LYMPHSABS 0.7 - 4.0 K/uL - - -     There is no height or weight on file to calculate BMI.  Orders:  No orders of the defined types were placed in this encounter.  No orders of the defined types were placed in this encounter.    Procedures: No procedures performed  Clinical Data: No additional findings.  ROS:  All other systems negative, except as noted in the HPI. Review of Systems  Objective: Vital Signs: There were no vitals taken for this visit.  Specialty Comments:  No specialty comments available.  PMFS History: Patient Active Problem List   Diagnosis Date Noted   Chest pain 04/14/2021   CAD (coronary artery disease)    Unstable angina (HCC)    Venous insufficiency (chronic) (peripheral)  Ulcer of right foot limited to breakdown of skin (HCC)    Chest pain, rule out acute myocardial infarction 12/23/2020   PAD (peripheral artery disease) (HCC) 12/23/2020   HLD (hyperlipidemia) 12/23/2020   ACS (acute coronary syndrome) (HCC) 12/01/2019   Non-ST elevation (NSTEMI) myocardial infarction (HCC) 12/01/2019   NSTEMI (non-ST elevated myocardial infarction) (HCC) 12/01/2019   History of transmetatarsal amputation of right foot (HCC)    Subacute osteomyelitis, right ankle and foot (HCC)    Infected blister of foot 08/13/2017   Pressure injury of skin 08/12/2017   Weakness 08/11/2017   COPD (chronic obstructive pulmonary disease) (HCC) 08/11/2017   Cellulitis of right lower extremity 08/11/2017   Fall at home,  initial encounter 08/11/2017   Thrombocytopenia (HCC) 08/11/2017   Weakness generalized 08/11/2017   Hypokalemia 08/11/2017   Essential hypertension 08/11/2017   Past Medical History:  Diagnosis Date   Abdominal aortic aneurysm    no AAA on US in 2020   CAD (coronary artery disease)    Stent to RCA 2021   Chronic obstructive pulmonary disease    HLD (hyperlipidemia)    HTN (hypertension)    Peripheral artery disease    s/p R fem pop // s/p R transmet amp    Family History  Adopted: Yes    Past Surgical History:  Procedure Laterality Date   ABDOMINAL AORTOGRAM N/A 08/17/2017   Procedure: ABDOMINAL AORTOGRAM;  Surgeon: Maeola Harmanain, Brandon Christopher, MD;  [facility];  Service: Cardiovascular;  Laterality: N/A;   AMPUTATION Right 08/21/2017   Procedure: TRANSMETATARSAL AMPUTATION RIGHT FOOT;  Surgeon: Nadara Mustarduda, Kaydyn Sayas V, MD;  [facility];  Service: Orthopedics;  Laterality: Right;   AMPUTATION TOE Right 08/18/2017   Procedure: AMPUTATION RIGHT SECOND AND THIRD TOES;  Surgeon: Maeola Harmanain, Brandon Christopher, MD;  [facility];  Service: Vascular;  Laterality: Right;   CORONARY BALLOON ANGIOPLASTY N/A 12/03/2019   Procedure: CORONARY BALLOON ANGIOPLASTY;  Surgeon: Corky CraftsVaranasi, Jayadeep S, MD;  [facility]: [facility] INV[facility] CV LAB;  Service: Cardiovascular;  Laterality: N/A;   CORONARY STENT INTERVENTION N/A 12/03/2019   Procedure: CORONARY STENT INTERVENTION;  Surgeon: Corky CraftsVaranasi, Jayadeep S, MD;  [facility];  Service: Cardiovascular;  Laterality: N/A;   ENDARTERECTOMY FEMORAL Right 08/18/2017   Procedure: ENDARTERECTOMY RIGHT SUPERFICIAL Alfredia FergusonFEMORAL/PROFUNDA;  Surgeon: Maeola Harmanain, Brandon Christopher, MD;  [facility];  Service: Vascular;  Laterality: Right;   FEMORAL-POPLITEAL BYPASS GRAFT Right 08/18/2017   Procedure: BYPASS GRAFT RIGHT COMMON FEMORAL TO ABOVE KNEE POPLITEAL ARTERY USING RIGHT REVERSED GREAT SAPHENOUS VEIN;  Surgeon: Maeola Harmanain, Brandon Christopher, MD;  [facility];   Service: Vascular;  Laterality: Right;   LEFT HEART CATH AND CORONARY ANGIOGRAPHY N/A 12/03/2019   Procedure: LEFT HEART CATH AND CORONARY ANGIOGRAPHY;  Surgeon: Corky CraftsVaranasi, Jayadeep S, MD;  [facility];  Service: Cardiovascular;  Laterality: N/A;   LEFT HEART CATH AND CORONARY ANGIOGRAPHY N/A 04/14/2021   Procedure: LEFT HEART CATH AND CORONARY ANGIOGRAPHY;  Surgeon: Corky CraftsVaranasi, Jayadeep S, MD;  [facility];  Service: Cardiovascular;  Laterality: N/A;   LOWER EXTREMITY ANGIOGRAPHY Right 08/17/2017   Procedure: Lower Extremity Angiography;  Surgeon: Maeola Harmanain, Brandon Christopher, MD;  [facility];  Service: Cardiovascular;  Laterality: Right;   PERIPHERAL VASCULAR BALLOON ANGIOPLASTY Right 08/17/2017   Procedure: PERIPHERAL VASCULAR BALLOON ANGIOPLASTY;  Surgeon: Maeola Harmanain, Brandon Christopher, MD;  [facility];  Service: Cardiovascular;  Laterality: Right;  SFA UNABLE TO CROSS   VEIN HARVEST  Right 08/18/2017   Procedure: VEIN HARVEST RIGHT GREAT SAPHENOUS;  Surgeon: Maeola Harman, MD;  Location: [facility] OR;  Service: Vascular;  Laterality: Right;   WOUND DEBRIDEMENT Right 08/18/2017   Procedure: DEBRIDEMENT WOUND RIGHT FOOT;  Surgeon: Maeola Harman, MD;  Location: [facility] OR;  Service: Vascular;  Laterality: Right;   Social History   Occupational History   Not on file  Tobacco Use   Smoking status: Former   Smokeless tobacco: Never  Vaping Use   Vaping Use: Former  Substance and Sexual Activity   Alcohol use: Not Currently   Drug use: No   Sexual activity: Not on file

## 2021-05-18 NOTE — Progress Notes (Signed)
Cardiology Office Note    Date:  05/18/2021   ID:  Jorge KusterMichael Kalama, DOB 05/04/1945, MRN [id number]   PCP:  Benita StabileHall, John Z, MD   [facility]  Cardiologist:  Charlton HawsPeter Nishan, MD   Advanced Practice Provider:  No care team member to display Electrophysiologist:  None   [id number]   No chief complaint on file.   History of Present Illness:  Jorge Clements is a 76 y.o. male  with CAD s/p PCI/DES to RCA (11/2019), PAD s/p right fem-pop bypass and right TMA, HTN, HLD, and COPD    Admitted with Unstable angina 03/2021 cath mild coronary artery irregularities. An acute marginal branch off of the right coronary artery is jailed by previously placed stent and has a 75% stenosis. This vessel is relatively small and would be at high risk to try any intervention. Imdur added, ASA stopped and Plavix added.  Patient comes in for f/u. Says he's doing great without any chest pain. Had to walk a long way to get in here so BP up today. BP usually 130's at home. No bleeding problems on plavix. Doesn't sleep but very inactive.    Past Medical History:  Diagnosis Date   Abdominal aortic aneurysm    no AAA on US in 2020   CAD (coronary artery disease)    Stent to RCA 2021   Chronic obstructive pulmonary disease    HLD (hyperlipidemia)    HTN (hypertension)    Peripheral artery disease    s/p R fem pop // s/p R transmet amp    Past Surgical History:  Procedure Laterality Date   ABDOMINAL AORTOGRAM N/A 08/17/2017   Procedure: ABDOMINAL AORTOGRAM;  Surgeon: Maeola Harmanain, Brandon Christopher, MD;  [facility];  Service: Cardiovascular;  Laterality: N/A;   AMPUTATION Right 08/21/2017   Procedure: TRANSMETATARSAL AMPUTATION RIGHT FOOT;  Surgeon: Nadara Mustarduda, Marcus V, MD;  [facility];  Service: Orthopedics;  Laterality: Right;   AMPUTATION TOE Right 08/18/2017   Procedure: AMPUTATION RIGHT SECOND AND THIRD TOES;  Surgeon: Maeola Harmanain, Brandon Christopher, MD;  [facility];  Service:  Vascular;  Laterality: Right;   CORONARY BALLOON ANGIOPLASTY N/A 12/03/2019   Procedure: CORONARY BALLOON ANGIOPLASTY;  Surgeon: Corky CraftsVaranasi, Jayadeep S, MD;  [facility] LAB;  Service: Cardiovascular;  Laterality: N/A;   CORONARY STENT INTERVENTION N/A 12/03/2019   Procedure: CORONARY STENT INTERVENTION;  Surgeon: Corky CraftsVaranasi, Jayadeep S, MD;  [facility];  Service: Cardiovascular;  Laterality: N/A;   ENDARTERECTOMY FEMORAL Right 08/18/2017   Procedure: ENDARTERECTOMY RIGHT SUPERFICIAL Alfredia FergusonFEMORAL/PROFUNDA;  Surgeon: Maeola Harmanain, Brandon Christopher, MD;  [facility];  Service: Vascular;  Laterality: Right;   FEMORAL-POPLITEAL BYPASS GRAFT Right 08/18/2017   Procedure: BYPASS GRAFT RIGHT COMMON FEMORAL TO ABOVE KNEE POPLITEAL ARTERY USING RIGHT REVERSED GREAT SAPHENOUS VEIN;  Surgeon: Maeola Harmanain, Brandon Christopher, MD;  [facility];  Service: Vascular;  Laterality: Right;   LEFT HEART CATH AND CORONARY ANGIOGRAPHY N/A 12/03/2019   Procedure: LEFT HEART CATH AND CORONARY ANGIOGRAPHY;  Surgeon: Corky CraftsVaranasi, Jayadeep S, MD;  [facility];  Service: Cardiovascular;  Laterality: N/A;   LEFT HEART CATH AND CORONARY ANGIOGRAPHY N/A 04/14/2021   Procedure: LEFT HEART CATH AND CORONARY ANGIOGRAPHY;  Surgeon: Corky CraftsVaranasi, Jayadeep S, MD;  [facility];  Service: Cardiovascular;  Laterality: N/A;   LOWER EXTREMITY ANGIOGRAPHY Right 08/17/2017   Procedure: Lower Extremity Angiography;  Surgeon: Maeola Harmanain, Brandon Christopher, MD;  [facility];  Service: Cardiovascular;  Laterality: Right;   PERIPHERAL VASCULAR BALLOON ANGIOPLASTY Right 08/17/2017   Procedure: PERIPHERAL VASCULAR BALLOON ANGIOPLASTY;  Surgeon: Maeola Harman, MD;  Location: [facility] CV LAB;  Service: Cardiovascular;  Laterality: Right;  SFA UNABLE TO CROSS   VEIN HARVEST Right 08/18/2017   Procedure: VEIN HARVEST RIGHT GREAT SAPHENOUS;  Surgeon: Maeola Harman, MD;  [facility];  Service: Vascular;  Laterality: Right;   WOUND DEBRIDEMENT Right 08/18/2017   Procedure: DEBRIDEMENT WOUND RIGHT FOOT;  Surgeon: Maeola Harman, MD;  Location: [facility] OR;  Service: Vascular;  Laterality: Right;    Current Medications: No outpatient medications have been marked as taking for the 05/26/21 encounter (Appointment) with Dyann Kief, PA-C.     Allergies:   Penicillins and Clindamycin   Social History   Socioeconomic History   Marital status: Divorced    Spouse name: Not on file   Number of children: Not on file   Years of education: Not on file   Highest education level: Not on file  Occupational History   Not on file  Tobacco Use   Smoking status: Former   Smokeless tobacco: Never  Vaping Use   Vaping Use: Former  Substance and Sexual Activity   Alcohol use: Not Currently   Drug use: No   Sexual activity: Not on file  Other Topics Concern   Not on file  Social History Narrative   Not on file   Social Determinants of Health   Financial Resource Strain: Not on file  Food Insecurity: Not on file  Transportation Needs: Not on file  Physical Activity: Not on file  Stress: Not on file  Social Connections: Not on file     Family History:  The patient's  family history is not on file. He was adopted.   ROS:   Please see the history of present illness.    ROS All other systems reviewed and are negative.   PHYSICAL EXAM:   VS:  BP (!) 152/74    Pulse 75    Ht 6' (1.829 m)    Wt 279 lb (126.6 kg)    SpO2 96%    BMI 37.84 kg/m   Physical Exam  GEN: Obese, in no acute distress   Neck: no JVD, carotid bruits, or masses Cardiac:RRR; no murmurs, rubs, or gallops  Respiratory:  decreased breath sounds but clear to auscultation bilaterally, normal work of breathing GI: soft, nontender, nondistended, + BS Ext: without cyanosis, clubbing, or edema, Good distal pulses bilaterally Neuro:  Alert and Oriented x 3 Psych: euthymic mood, full  affect  Wt Readings from Last 3 Encounters:  04/14/21 280 lb (127 kg)  03/31/21 280 lb (127 kg)  02/19/21 287 lb (130.2 kg)      Studies/Labs Reviewed:   EKG:  EKG is not ordered today.    Recent Labs: 01/18/2021: B Natriuretic Peptide 96.7 04/15/2021: BUN 15; Creatinine, Ser 1.00; Hemoglobin 12.3; Platelets 96; Potassium 3.7; Sodium 135   Lipid Panel    Component Value Date/Time   CHOL 90 12/24/2020 0326   TRIG 135 12/24/2020 0326   HDL 29 (L) 12/24/2020 0326   CHOLHDL 3.1 12/24/2020 0326   VLDL 27 12/24/2020 0326   LDLCALC 34 12/24/2020 0326    Additional studies/ records that were reviewed today include:  Cath 04/14/21   Prox RCA lesion is 25% stenosed.   Acute Mrg lesion is 75% stenosed.  Jailed by RCA stent.  TIMI 3 flow.  Prox LAD to Mid LAD lesion is 25% stenosed.   1st Mrg lesion is 25% stenosed.   Previously placed Mid RCA to Dist RCA stent is widely patent.   The left ventricular systolic function is normal.   LV end diastolic pressure is normal.   The left ventricular ejection fraction is 55-65% by visual estimate.   There is no aortic valve stenosis.   Only obstructive disease is in a marginal branch of the RCA that was jailed by the previous stent.  Risk of intervention will be higher than benefit.  Would encourage medical therapy.  No significant disease in the main vessels.  Right PDA is much larger on today's cath than it was at the time of his non-STEMI in 2021.   I suspect his minimally elevated troponin was demand ischemia from his high blood pressure.  Continue aggressive secondary prevention.  Given his history of PAD as well as CAD, ontinuing clopidogrel may be reasonable if he does not have significant bleeding issues.  Echo 04/14/21 IMPRESSIONS     1. Left ventricular ejection fraction, by estimation, is 60 to 65%. The  left ventricle has normal function. The left ventricle has no regional  wall motion abnormalities. There is mild left  ventricular hypertrophy.  Left ventricular diastolic parameters  were normal.   2. Right ventricular systolic function is normal. The right ventricular  size is normal.   3. Left atrial size was mildly dilated.   4. The mitral valve is normal in structure. No evidence of mitral valve  regurgitation. No evidence of mitral stenosis.   5. The aortic valve was not well visualized. Aortic valve regurgitation  is not visualized. No aortic stenosis is present.   6. The inferior vena cava is normal in size with greater than 50%  respiratory variability, suggesting right atrial pressure of 3 mmHg.      Risk Assessment/Calculations:         ASSESSMENT:    1. Coronary artery disease involving native coronary artery of native heart without angina pectoris   2. PAD (peripheral artery disease) (HCC)   3. Essential hypertension   4. Hyperlipidemia, unspecified hyperlipidemia type   5. Chronic obstructive pulmonary disease, unspecified COPD type (HCC)   6. Obesity (BMI 30-39.9)      PLAN:  In order of problems listed above:  CAD s/p PCI/DES to RCA (11/2019), NSTEMI 04/13/21 caht mild coronary artery irregularities. An acute marginal branch off of the right coronary artery is jailed by previously placed stent and has a 75% stenosis. This vessel is relatively small and would be at high risk to try and intervention. Medical therapy and risk reduction recommended. Feels the best he has in a long time. Will not make any changes today  PAD S/P right fem pop bypass and right TMA  HTN BP running high today, but had a long walk in. Exercise limited by legs. I've asked him to keep track of BP's at home and call us if trending >135/85. We can increase losartan or metoprolol if needed.   HLD LDL 34 12/2020 on crestor  COPD no longer smokes  Obesity-weight loss discussed  Shared Decision Making/Informed Consent        Medication Adjustments/Labs and Tests Ordered: Current medicines are reviewed  at length with the patient today.  Concerns regarding medicines are outlined above.  Medication changes, Labs and Tests ordered today are listed in the Patient Instructions below. There are no Patient Instructions on file for this visit.  Elson Clan, PA-C  05/18/2021 3:34 PM    [facility] [address] [place], [place], [place]  [address] Phone: [phone]; Fax: [phone]

## 2021-05-26 ENCOUNTER — Other Ambulatory Visit: Payer: Self-pay

## 2021-05-26 ENCOUNTER — Ambulatory Visit: Payer: Medicare HMO | Admitting: Physician Assistant

## 2021-05-26 ENCOUNTER — Encounter: Payer: Self-pay | Admitting: Physician Assistant

## 2021-05-26 VITALS — BP 152/74 | HR 75 | Ht 72.0 in | Wt 279.0 lb

## 2021-05-26 DIAGNOSIS — J449 Chronic obstructive pulmonary disease, unspecified: Secondary | ICD-10-CM | POA: Diagnosis not present

## 2021-05-26 DIAGNOSIS — I739 Peripheral vascular disease, unspecified: Secondary | ICD-10-CM

## 2021-05-26 DIAGNOSIS — I10 Essential (primary) hypertension: Secondary | ICD-10-CM

## 2021-05-26 DIAGNOSIS — E785 Hyperlipidemia, unspecified: Secondary | ICD-10-CM

## 2021-05-26 DIAGNOSIS — E669 Obesity, unspecified: Secondary | ICD-10-CM | POA: Diagnosis not present

## 2021-05-26 DIAGNOSIS — I2510 Atherosclerotic heart disease of native coronary artery without angina pectoris: Secondary | ICD-10-CM | POA: Diagnosis not present

## 2021-05-26 NOTE — Patient Instructions (Signed)
Medication Instructions:   Your physician recommends that you continue on your current medications as directed. Please refer to the Current Medication list given to you today.  *If you need a refill on your cardiac medications before your next appointment, please call your pharmacy*   Lab Work: NONE   If you have labs (blood work) drawn today and your tests are completely normal, you will receive your results only by: MyChart Message (if you have MyChart) OR A paper copy in the mail If you have any lab test that is abnormal or we need to change your treatment, we will call you to review the results.   Testing/Procedures: NONE    Follow-Up: At [facility], you and your health needs are our priority.  As part of our continuing mission to provide you with exceptional heart care, we have created designated Provider Care Teams.  These Care Teams include your primary Cardiologist (physician) and Advanced Practice Providers (APPs -  Physician Assistants and Nurse Practitioners) who all work together to provide you with the care you need, when you need it.  We recommend signing up for the patient portal called "MyChart".  Sign up information is provided on this After Visit Summary.  MyChart is used to connect with patients for Virtual Visits (Telemedicine).  Patients are able to view lab/test results, encounter notes, upcoming appointments, etc.  Non-urgent messages can be sent to your provider as well.   To learn more about what you can do with MyChart, go to [email or web address].    Your next appointment:   6 month(s)  The format for your next appointment:   In Person  Provider:   Charlton Haws, MD    Other Instructions  Your physician has requested that you regularly monitor and record your blood pressure readings at home. Please use the same machine at the same time of day to check your readings and record them to bring to your follow-up visit. Goal blood pressure is under  135/85.   Thank you for choosing [facility]!

## 2021-05-28 ENCOUNTER — Ambulatory Visit (INDEPENDENT_AMBULATORY_CARE_PROVIDER_SITE_OTHER): Payer: Medicare HMO | Admitting: Orthopedic Surgery

## 2021-05-28 ENCOUNTER — Other Ambulatory Visit: Payer: Self-pay

## 2021-05-28 DIAGNOSIS — L97511 Non-pressure chronic ulcer of other part of right foot limited to breakdown of skin: Secondary | ICD-10-CM | POA: Diagnosis not present

## 2021-05-28 DIAGNOSIS — Z89431 Acquired absence of right foot: Secondary | ICD-10-CM

## 2021-05-29 ENCOUNTER — Encounter: Payer: Self-pay | Admitting: Orthopedic Surgery

## 2021-05-29 NOTE — Progress Notes (Signed)
Office Visit Note   Patient: Jorge Clements           Date of Birth: November 27, 1945           MRN: [id number] Visit Date: 05/28/2021              Requested by: Benita Stabile, MD [address] [place],  [place] [address] PCP: Benita Stabile, MD  Chief Complaint  Patient presents with   Right Foot - Follow-up    Hx right transmet amputation      HPI: Patient is a 76 year old gentleman who is seen in follow-up for dermatitis for the right transmetatarsal amputation secondary to swelling.  He is currently wearing the Vive compression socks has no complaints.  Assessment & Plan: Visit Diagnoses:  1. Non-pressure chronic ulcer of other part of right foot limited to breakdown of skin (HCC)   2. History of transmetatarsal amputation of right foot (HCC)     Plan: Patient continues to show decreased swelling and improvement of the dermatitis.  Continue with current care.  Follow-Up Instructions: Return in about 4 weeks (around 06/25/2021).   Ortho Exam  Patient is alert, oriented, no adenopathy, well-dressed, normal affect, normal respiratory effort. Examination of the dermatitis continues to show excellent improvement nonviable tissue was debrided and there were no open ulcers no cellulitis no tenderness to palpation there is decreased swelling.  The surgical incision is well-healed.  Imaging: No results found. No images are attached to the encounter.  Labs: Lab Results  Component Value Date   HGBA1C 6.4 (H) 12/02/2019   ESRSEDRATE 28 (H) 01/18/2021   ESRSEDRATE 40 (H) 12/24/2020   CRP 11.1 (H) 01/18/2021   CRP 1.9 (H) 12/24/2020   REPTSTATUS 12/27/2020 FINAL 12/24/2020   GRAMSTAIN NO WBC SEEN NO ORGANISMS SEEN  12/24/2020   CULT  12/24/2020    FEW STAPHYLOCOCCUS AUREUS WITHIN MIXED ORGANISMS Performed at [facility], [address], [place], [place] [address]    [facility] STAPHYLOCOCCUS AUREUS 12/24/2020     Lab Results  Component Value Date   ALBUMIN 3.9  12/01/2019   ALBUMIN 3.7 09/26/2017   ALBUMIN 2.3 (L) 08/17/2017    Lab Results  Component Value Date   MG 2.2 08/17/2017   MG 1.8 08/15/2017   No results found for: VD25OH  No results found for: PREALBUMIN CBC EXTENDED Latest Ref Rng & Units 04/15/2021 04/14/2021 04/13/2021  WBC 4.0 - 10.5 K/uL 6.1 7.6 7.9  RBC 4.22 - 5.81 MIL/uL 4.42 4.67 4.76  HGB 13.0 - 17.0 g/dL 12.3(L) 13.3 13.2  HCT 39.0 - 52.0 % 38.8(L) 41.9 42.2  PLT 150 - 400 K/uL 96(L) 101(L) 113(L)  NEUTROABS 1.7 - 7.7 K/uL - - -  LYMPHSABS 0.7 - 4.0 K/uL - - -     There is no height or weight on file to calculate BMI.  Orders:  No orders of the defined types were placed in this encounter.  No orders of the defined types were placed in this encounter.    Procedures: No procedures performed  Clinical Data: No additional findings.  ROS:  All other systems negative, except as noted in the HPI. Review of Systems  Objective: Vital Signs: There were no vitals taken for this visit.  Specialty Comments:  No specialty comments available.  PMFS History: Patient Active Problem List   Diagnosis Date Noted   Chest pain 04/14/2021   CAD (coronary artery disease)    Unstable angina (HCC)  Venous insufficiency (chronic) (peripheral)    Ulcer of right foot limited to breakdown of skin (HCC)    Chest pain, rule out acute myocardial infarction 12/23/2020   PAD (peripheral artery disease) (HCC) 12/23/2020   HLD (hyperlipidemia) 12/23/2020   ACS (acute coronary syndrome) (HCC) 12/01/2019   Non-ST elevation (NSTEMI) myocardial infarction (HCC) 12/01/2019   NSTEMI (non-ST elevated myocardial infarction) (HCC) 12/01/2019   History of transmetatarsal amputation of right foot (HCC)    Subacute osteomyelitis, right ankle and foot (HCC)    Infected blister of foot 08/13/2017   Pressure injury of skin 08/12/2017   Weakness 08/11/2017   COPD (chronic obstructive pulmonary disease) (HCC) 08/11/2017   Cellulitis of  right lower extremity 08/11/2017   Fall at home, initial encounter 08/11/2017   Thrombocytopenia (HCC) 08/11/2017   Weakness generalized 08/11/2017   Hypokalemia 08/11/2017   Essential hypertension 08/11/2017   Past Medical History:  Diagnosis Date   Abdominal aortic aneurysm    no AAA on [place] in 2020   CAD (coronary artery disease)    Stent to RCA 2021   Chronic obstructive pulmonary disease    HLD (hyperlipidemia)    HTN (hypertension)    Peripheral artery disease    s/p R fem pop // s/p R transmet amp    Family History  Adopted: Yes    Past Surgical History:  Procedure Laterality Date   ABDOMINAL AORTOGRAM N/A 08/17/2017   Procedure: ABDOMINAL AORTOGRAM;  Surgeon: Maeola Harman, MD;  Location[facility] INVASIVE CV LAB;  Service: Cardiovascular;  Laterality: N/A;   AMPUTATION Right 08/21/2017   Procedure: TRANSMETATARSAL AMPUTATION RIGHT FOOT;  Surgeon: Nadara Mustard, MD;  Location: [facility] OR;  Service: Orthopedics;  Laterality: Right;   AMPUTATION TOE Right 08/18/2017   Procedure: AMPUTATION RIGHT SECOND AND THIRD TOES;  Surgeon: Maeola Harman, MD;  Location: [facility] OR;  Service: Vascular;  Laterality: Right;   CORONARY BALLOON ANGIOPLASTY N/A 12/03/2019   Procedure: CORONARY BALLOON ANGIOPLASTY;  Surgeon: Corky Crafts, MD;  Location: [facility] INVAS[facility] CV LAB;  Service: Cardiovascular;  Laterality: N/A;   CORONARY STENT INTERVENTION N/A 12/03/2019   Procedure: CORONARY STENT INTERVENTION;  Surgeon: Corky Crafts, MD;  Location[facility] INVASIVE CV LAB;  Service: Cardiovascular;  Laterality: N/A;   ENDARTERECTOMY FEMORAL Right 08/18/2017   Procedure: ENDARTERECTOMY RIGHT SUPERFICIAL Alfredia Ferguson;  Surgeon: Maeola Harman, MD;  Location: [facility] OR;  Service: Vascular;  Laterality: Right;   FEMORAL-POPLITEAL BYPASS GRAFT Right 08/18/2017   Procedure: BYPASS GRAFT RIGHT COMMON FEMORAL TO ABOVE KNEE POPLITEAL ARTERY USING RIGHT REVERSED GREAT SAPHENOUS VEIN;  Surgeon:  Maeola Harman, MD;  Location: [facility] OR;  Service: Vascular;  Laterality: Right;   LEFT HEART CATH AND CORONARY ANGIOGRAPHY N/A 12/03/2019   Procedure: LEFT HEART CATH AND CORONARY ANGIOGRAPHY;  Surgeon: Corky Crafts, MD;  Location: [facility] INVASIVE CV LAB;  Service: Cardiovascular;  Laterality: N/A;   LEFT HEART CATH AND CORONARY ANGIOGRAPHY N/A 04/14/2021   Procedure: LEFT HEART CATH AND CORONARY ANGIOGRAPHY;  Surgeon: Corky Crafts, MD;  Location: [facility] INVASIVE CV LAB;  Service: Cardiovascular;  Laterality: N/A;   LOWER EXTREMITY ANGIOGRAPHY Right 08/17/2017   Procedure: Lower Extremity Angiography;  Surgeon: Maeola Harman, MD;  Location: [facility] INVASIVE CV LAB;  Service: Cardiovascular;  Laterality: Right;   PERIPHERAL VASCULAR BALLOON ANGIOPLASTY Right 08/17/2017   Procedure: PERIPHERAL VASCULAR BALLOON ANGIOPLASTY;  Surgeon: Maeola Harman, MD;  Location: [facility] INVASIVE CV LAB;  Service: Cardiovascular;  Laterality: Right;  SFA  UNABLE TO CROSS   VEIN HARVEST Right 08/18/2017   Procedure: VEIN HARVEST RIGHT GREAT SAPHENOUS;  Surgeon: Maeola Harman, MD;  Location: [facility] OR;  Service: Vascular;  Laterality: Right;   WOUND DEBRIDEMENT Right 08/18/2017   Procedure: DEBRIDEMENT WOUND RIGHT FOOT;  Surgeon: Maeola Harman, MD;  Location: [facility] OR;  Service: Vascular;  Laterality: Right;   Social History   Occupational History   Not on file  Tobacco Use   Smoking status: Former   Smokeless tobacco: Never  Vaping Use   Vaping Use: Former  Substance and Sexual Activity   Alcohol use: Not Currently   Drug use: No   Sexual activity: Not on file

## 2021-06-25 ENCOUNTER — Other Ambulatory Visit: Payer: Self-pay

## 2021-06-25 ENCOUNTER — Ambulatory Visit (INDEPENDENT_AMBULATORY_CARE_PROVIDER_SITE_OTHER): Payer: Medicare HMO | Admitting: Orthopedic Surgery

## 2021-06-25 DIAGNOSIS — Z89431 Acquired absence of right foot: Secondary | ICD-10-CM | POA: Diagnosis not present

## 2021-06-25 DIAGNOSIS — L97511 Non-pressure chronic ulcer of other part of right foot limited to breakdown of skin: Secondary | ICD-10-CM | POA: Diagnosis not present

## 2021-06-25 NOTE — Progress Notes (Unsigned)
HISTORY AND PHYSICAL     CC:  follow up. Requesting Provider:  Celene Squibb, MD  HPI: This is a 76 y.o. male who is here today for follow up for PAD.  His vascular hx is significant for right femoral to above knee bypass with vein on 08/18/2017 by Dr. Donzetta Matters.  He subsequently underwent a TMA on the right by Dr. Sharol Given 08/21/2017.   Pt was last seen 03/31/2021 and at that time, his foot had improved and Dr. Sharol Given was following foot wounds.  He had brisk monophasic doppler flow in right foot and velocities at proximal anastomosis were essentially unchanged and his foot was slowly getting better.  Plan was for short interval follow up.  He was seen by Dr. Sharol Given on 05/28/2021 and at that time, pt continued to show decreased swelling and improvement in dermatitis and he was to continue with his compression.    The pt returns today for follow up.  ***  The pt is on a statin for cholesterol management.    The pt is not on an aspirin.    Other AC:  Plavix The pt is on BB, ARB for hypertension.  The pt does not have diabetes. Tobacco hx:  former  Pt does not have family hx of AAA.  Past Medical History:  Diagnosis Date   Abdominal aortic aneurysm    no AAA on [place] in 2020   CAD (coronary artery disease)    Stent to RCA 2021   Chronic obstructive pulmonary disease    HLD (hyperlipidemia)    HTN (hypertension)    Peripheral artery disease    s/p R fem pop // s/p R transmet amp    Past Surgical History:  Procedure Laterality Date   ABDOMINAL AORTOGRAM N/A 08/17/2017   Procedure: ABDOMINAL AORTOGRAM;  Surgeon: Waynetta Sandy, MD;  Location: [place] [facility];  Service: Cardiovascular;  Laterality: N/A;   AMPUTATION Right 08/21/2017   Procedure: TRANSMETATARSAL AMPUTATION RIGHT FOOT;  Surgeon: Newt Minion, MD;  Location: [place];  Service: Orthopedics;  Laterality: Right;   AMPUTATION TOE Right 08/18/2017   Procedure: AMPUTATION RIGHT SECOND AND THIRD TOES;  Surgeon: Waynetta Sandy,  MD;  Location: [place];  Service: Vascular;  Laterality: Right;   CORONARY BALLOON ANGIOPLASTY N/A 12/03/2019   Procedure: CORONARY BALLOON ANGIOPLASTY;  Surgeon: Jettie Booze, MD;  Location: [place] [facility];  Service: Cardiovascular;  Laterality: N/A;   CORONARY STENT INTERVENTION N/A 12/03/2019   Procedure: CORONARY STENT INTERVENTION;  Surgeon: Jettie Booze, MD;  Location: [place] [facility];  Service: Cardiovascular;  Laterality: N/A;   ENDARTERECTOMY FEMORAL Right 08/18/2017   Procedure: ENDARTERECTOMY RIGHT SUPERFICIAL Jerene Canny;  Surgeon: Waynetta Sandy, MD;  Location: [place];  Service: Vascular;  Laterality: Right;   FEMORAL-POPLITEAL BYPASS GRAFT Right 08/18/2017   Procedure: BYPASS GRAFT RIGHT COMMON FEMORAL TO ABOVE KNEE POPLITEAL ARTERY USING RIGHT REVERSED GREAT SAPHENOUS VEIN;  Surgeon: Waynetta Sandy, MD;  Location: [place];  Service: Vascular;  Laterality: Right;   LEFT HEART CATH AND CORONARY ANGIOGRAPHY N/A 12/03/2019   Procedure: LEFT HEART CATH AND CORONARY ANGIOGRAPHY;  Surgeon: Jettie Booze, MD;  Location: [place] [facility];  Service: Cardiovascular;  Laterality: N/A;   LEFT HEART CATH AND CORONARY ANGIOGRAPHY N/A 04/14/2021   Procedure: LEFT HEART CATH AND CORONARY ANGIOGRAPHY;  Surgeon: Jettie Booze, MD;  Location: [place] [facility];  Service: Cardiovascular;  Laterality: N/A;   LOWER EXTREMITY ANGIOGRAPHY  Right 08/17/2017   Procedure: Lower Extremity Angiography;  Surgeon: Waynetta Sandy, MD;  Location: [place] [facility];  Service: Cardiovascular;  Laterality: Right;   PERIPHERAL VASCULAR BALLOON ANGIOPLASTY Right 08/17/2017   Procedure: PERIPHERAL VASCULAR BALLOON ANGIOPLASTY;  Surgeon: Waynetta Sandy, MD;  Location: [place] [facility];  Service: Cardiovascular;  Laterality: Right;  SFA UNABLE TO CROSS   VEIN HARVEST Right 08/18/2017   Procedure: VEIN HARVEST RIGHT GREAT SAPHENOUS;  Surgeon: Waynetta Sandy, MD;  Location: [place];  Service: Vascular;  Laterality: Right;   WOUND DEBRIDEMENT Right 08/18/2017   Procedure: DEBRIDEMENT WOUND RIGHT FOOT;  Surgeon: Waynetta Sandy, MD;  Location: [place];  Service: Vascular;  Laterality: Right;    Allergies  Allergen Reactions   Penicillins Hives and Rash    Childhood reaction Has patient had a PCN reaction causing immediate rash, facial/tongue/throat swelling, SOB or lightheadedness with hypotension: No PT DEVELOPED ## SEVERE ## RASH INVOLVING MUCUS MEMBRANES or SKIN NECROSIS: #  #  YES  #  # Has patient had a PCN reaction that required hospitalization: Unknown Has patient had a PCN reaction occurring within the last 10 years: No   Clindamycin Rash    Diffuse rash across body    Current Outpatient Medications  Medication Sig Dispense Refill   BREZTRI AEROSPHERE 160-9-4.8 MCG/ACT AERO Inhale 2 puffs into the lungs 2 (two) times daily.     clopidogrel (PLAVIX) 75 MG tablet Take 1 tablet (75 mg total) by mouth daily. 30 tablet 1   isosorbide mononitrate (IMDUR) 30 MG 24 hr tablet Take 1 tablet (30 mg total) by mouth daily. 30 tablet 1   losartan (COZAAR) 50 MG tablet Take 1 tablet by mouth once daily (Patient taking differently: Take 50 mg by mouth daily.) 90 tablet 3   metoprolol tartrate (LOPRESSOR) 25 MG tablet Take 1 tablet by mouth twice daily (Patient taking differently: Take 25 mg by mouth 2 (two) times daily.) 180 tablet 1   Multiple Vitamins-Minerals (CENTRUM SILVER 50+MEN PO) Take 1 tablet by mouth daily.     nitroGLYCERIN (NITROSTAT) 0.4 MG SL tablet Place 1 tablet (0.4 mg total) under the tongue every 5 (five) minutes as needed for chest pain. 25 tablet 3   rosuvastatin (CRESTOR) 40 MG tablet Take 1 tablet by mouth once daily (Patient taking differently: Take 40 mg by mouth daily.) 180 tablet 2   No current facility-administered medications for this visit.    Family History  Adopted: Yes    Social History    Socioeconomic History   Marital status: Divorced    Spouse name: Not on file   Number of children: Not on file   Years of education: Not on file   Highest education level: Not on file  Occupational History   Not on file  Tobacco Use   Smoking status: Former   Smokeless tobacco: Never  Vaping Use   Vaping Use: Former  Substance and Sexual Activity   Alcohol use: Not Currently   Drug use: No   Sexual activity: Not on file  Other Topics Concern   Not on file  Social History Narrative   Not on file   Social Determinants of Health   Financial Resource Strain: Not on file  Food Insecurity: Not on file  Transportation Needs: Not on file  Physical Activity: Not on file  Stress: Not on file  Social Connections: Not on file  Intimate Partner Violence: Not on file  REVIEW OF SYSTEMS:  *** [X]  denotes positive finding, [ ]  denotes negative finding Cardiac  Comments:  Chest pain or chest pressure:    Shortness of breath upon exertion:    Short of breath when lying flat:    Irregular heart rhythm:        Vascular    Pain in calf, thigh, or hip brought on by ambulation:    Pain in feet at night that wakes you up from your sleep:     Blood clot in your veins:    Leg swelling:         Pulmonary    Oxygen at home:    Productive cough:     Wheezing:         Neurologic    Sudden weakness in arms or legs:     Sudden numbness in arms or legs:     Sudden onset of difficulty speaking or slurred speech:    Temporary loss of vision in one eye:     Problems with dizziness:         Gastrointestinal    Blood in stool:     Vomited blood:         Genitourinary    Burning when urinating:     Blood in urine:        Psychiatric    Major depression:         Hematologic    Bleeding problems:    Problems with blood clotting too easily:        Skin    Rashes or ulcers:        Constitutional    Fever or chills:      PHYSICAL EXAMINATION:  ***  General:  WDWN in  NAD; vital signs documented above Gait: Not observed HENT: WNL, normocephalic Pulmonary: normal non-labored breathing , without wheezing Cardiac: {Desc; regular/irreg:14544} HR, {With/Without:20273} carotid bruit*** Abdomen: soft, NT, no masses; aortic pulse is *** palpable Skin: {With/Without:20273} rashes Vascular Exam/Pulses:  Right Left  Radial {Exam; arterial pulse strength 0-4:30167} {Exam; arterial pulse strength 0-4:30167}  Femoral {Exam; arterial pulse strength 0-4:30167} {Exam; arterial pulse strength 0-4:30167}  Popliteal {Exam; arterial pulse strength 0-4:30167} {Exam; arterial pulse strength 0-4:30167}  DP {Exam; arterial pulse strength 0-4:30167} {Exam; arterial pulse strength 0-4:30167}  PT {Exam; arterial pulse strength 0-4:30167} {Exam; arterial pulse strength 0-4:30167}  Peroneal *** ***   Extremities: {With/Without:20273} ischemic changes, {With/Without:20273} Gangrene , {With/Without:20273} cellulitis; {With/Without:20273} open wounds;  Musculoskeletal: no muscle wasting or atrophy  Neurologic: A&O X 3 Psychiatric:  The pt has {Desc; normal/abnormal:11317::"Normal"} affect.   Non-Invasive Vascular Imaging:   ABI's/TBI's on 06/30/2021: Right:  *** - Great toe pressure: *** Left:  *** - Great toe pressure: ***  Arterial duplex on 06/30/2021: ***  Previous ABI's/TBI's on 03/31/2021: Right:  0.82 - Great toe pressure: n/a Left:  0.64/0.39 - Great toe pressure:  63  Previous arterial duplex on 03/31/2021: Right Graft #1: Femoral-AK popliteal  +------------------+--------+---------------+----------+--------+                      PSV cm/s Stenosis        Waveform   Comments   +------------------+--------+---------------+----------+--------+   Inflow             353      50-74% stenosis monophasic            +------------------+--------+---------------+----------+--------+   Prox Anastomosis   138  monophasic             +------------------+--------+---------------+----------+--------+   Proximal Graft     83                       monophasic            +------------------+--------+---------------+----------+--------+   Mid Graft          60                       monophasic            +------------------+--------+---------------+----------+--------+   Distal Graft       68                       monophasic            +------------------+--------+---------------+----------+--------+   Distal Anastomosis 74                       monophasic            +------------------+--------+---------------+----------+--------+   Outflow            139                      monophasic            +------------------+--------+---------------+----------+--------+    ASSESSMENT/PLAN:: 76 y.o. male here for follow up for right femoral to above knee bypass with vein on 08/18/2017 by Dr. Donzetta Matters.  He subsequently underwent a TMA on the right by Dr. Sharol Given 08/21/2017.   -*** -pt will f/u in *** with ***.   Leontine Locket, [facility] [phone]  Clinic MD:   Carlis Abbott

## 2021-06-25 NOTE — H&P (View-Only) (Signed)
?HISTORY AND PHYSICAL  ? ? ? ?CC:  follow up. ?Requesting Provider:  Celene Squibb, MD ? ?HPI: This is a 76 y.o. male who is here today for follow up for PAD.  His vascular hx is significant for right femoral to above knee bypass with vein on 08/18/2017 by Dr. Donzetta Matters.  He subsequently underwent a TMA on the right by Dr. Sharol Given 08/21/2017.  ? ?Pt was last seen 03/31/2021 and at that time, his foot had improved and Dr. Sharol Given was following foot wounds.  He had brisk monophasic doppler flow in right foot and velocities at proximal anastomosis were essentially unchanged and his foot was slowly getting better.  Plan was for short interval follow up.  He was seen by Dr. Sharol Given on 05/28/2021 and at that time, pt continued to show decreased swelling and improvement in dermatitis and he was to continue with his compression.   ? ?The pt returns today for follow up.  He states that overall he is doing well.  He continues to see Dr. Sharol Given for his right leg.  He says it may be a bit better.  He denies any claudication or rest pain.  He states that he sometimes tries to trim his toenails on the left foot and nicks his skin because he doesn't have any feeling in his feet.   ? ?The pt is on a statin for cholesterol management.    ?The pt is not on an aspirin.    Other AC:  Plavix ?The pt is on BB, ARB for hypertension.  ?The pt does not have diabetes. ?Tobacco hx:  former ? ?Pt does not know his family hx as he is adopted.  ? ?Past Medical History:  ?Diagnosis Date  ? Abdominal aortic aneurysm   ? no AAA on [place] in 2020  ? CAD (coronary artery disease)   ? Stent to RCA 2021  ? Chronic obstructive pulmonary disease   ? HLD (hyperlipidemia)   ? HTN (hypertension)   ? Peripheral artery disease   ? s/p R fem pop // s/p R transmet amp  ? ? ?Past Surgical History:  ?Procedure Laterality Date  ? ABDOMINAL AORTOGRAM N/A 08/17/2017  ? Procedure: ABDOMINAL AORTOGRAM;  Surgeon: Waynetta Sandy, MD;  Location: [place] [facility];  Service:  Cardiovascular;  Laterality: N/A;  ? AMPUTATION Right 08/21/2017  ? Procedure: TRANSMETATARSAL AMPUTATION RIGHT FOOT;  Surgeon: Newt Minion, MD;  Location: [place];  Service: Orthopedics;  Laterality: Right;  ? AMPUTATION TOE Right 08/18/2017  ? Procedure: AMPUTATION RIGHT SECOND AND THIRD TOES;  Surgeon: Waynetta Sandy, MD;  Location: [place];  Service: Vascular;  Laterality: Right;  ? CORONARY BALLOON ANGIOPLASTY N/A 12/03/2019  ? Procedure: CORONARY BALLOON ANGIOPLASTY;  Surgeon: Jettie Booze, MD;  Location: [place] [facility];  Service: Cardiovascular;  Laterality: N/A;  ? CORONARY STENT INTERVENTION N/A 12/03/2019  ? Procedure: CORONARY STENT INTERVENTION;  Surgeon: Jettie Booze, MD;  Location: [place] [facility];  Service: Cardiovascular;  Laterality: N/A;  ? ENDARTERECTOMY FEMORAL Right 08/18/2017  ? Procedure: ENDARTERECTOMY RIGHT SUPERFICIAL FEMORAL/PROFUNDA;  Surgeon: Waynetta Sandy, MD;  Location: [place];  Service: Vascular;  Laterality: Right;  ? FEMORAL-POPLITEAL BYPASS GRAFT Right 08/18/2017  ? Procedure: BYPASS GRAFT RIGHT COMMON FEMORAL TO ABOVE KNEE POPLITEAL ARTERY USING RIGHT REVERSED GREAT SAPHENOUS VEIN;  Surgeon: Waynetta Sandy, MD;  Location: [place];  Service: Vascular;  Laterality: Right;  ? LEFT HEART CATH AND CORONARY ANGIOGRAPHY N/A 12/03/2019  ?  Procedure: LEFT HEART CATH AND CORONARY ANGIOGRAPHY;  Surgeon: Jettie Booze, MD;  Location: [place] [facility];  Service: Cardiovascular;  Laterality: N/A;  ? LEFT HEART CATH AND CORONARY ANGIOGRAPHY N/A 04/14/2021  ? Procedure: LEFT HEART CATH AND CORONARY ANGIOGRAPHY;  Surgeon: Jettie Booze, MD;  Location: [place] [facility];  Service: Cardiovascular;  Laterality: N/A;  ? LOWER EXTREMITY ANGIOGRAPHY Right 08/17/2017  ? Procedure: Lower Extremity Angiography;  Surgeon: Waynetta Sandy, MD;  Location: [place] [facility];  Service: Cardiovascular;  Laterality: Right;  ? PERIPHERAL VASCULAR  BALLOON ANGIOPLASTY Right 08/17/2017  ? Procedure: PERIPHERAL VASCULAR BALLOON ANGIOPLASTY;  Surgeon: Waynetta Sandy, MD;  Location: [place] [facility];  Service: Cardiovascular;  Laterality: Right;  SFA UNABLE TO CROSS  ? VEIN HARVEST Right 08/18/2017  ? Procedure: VEIN HARVEST RIGHT GREAT SAPHENOUS;  Surgeon: Waynetta Sandy, MD;  Location: [place];  Service: Vascular;  Laterality: Right;  ? WOUND DEBRIDEMENT Right 08/18/2017  ? Procedure: DEBRIDEMENT WOUND RIGHT FOOT;  Surgeon: Waynetta Sandy, MD;  Location: [place];  Service: Vascular;  Laterality: Right;  ? ? ?Allergies  ?Allergen Reactions  ? Penicillins Hives and Rash  ?  Childhood reaction ?Has patient had a PCN reaction causing immediate rash, facial/tongue/throat swelling, SOB or lightheadedness with hypotension: No ?PT DEVELOPED ## SEVERE ## RASH INVOLVING MUCUS MEMBRANES or SKIN NECROSIS: #  #  YES  #  # ?Has patient had a PCN reaction that required hospitalization: Unknown ?Has patient had a PCN reaction occurring within the last 10 years: No  ? Clindamycin Rash  ?  Diffuse rash across body  ? ? ?Current Outpatient Medications  ?Medication Sig Dispense Refill  ? BREZTRI AEROSPHERE 160-9-4.8 MCG/ACT AERO Inhale 2 puffs into the lungs 2 (two) times daily.    ? clopidogrel (PLAVIX) 75 MG tablet Take 1 tablet (75 mg total) by mouth daily. 30 tablet 1  ? isosorbide mononitrate (IMDUR) 30 MG 24 hr tablet Take 1 tablet (30 mg total) by mouth daily. 30 tablet 1  ? losartan (COZAAR) 50 MG tablet Take 1 tablet by mouth once daily (Patient taking differently: Take 50 mg by mouth daily.) 90 tablet 3  ? metoprolol tartrate (LOPRESSOR) 25 MG tablet Take 1 tablet by mouth twice daily (Patient taking differently: Take 25 mg by mouth 2 (two) times daily.) 180 tablet 1  ? Multiple Vitamins-Minerals (CENTRUM SILVER 50+MEN PO) Take 1 tablet by mouth daily.    ? nitroGLYCERIN (NITROSTAT) 0.4 MG SL tablet Place 1 tablet (0.4 mg total) under the tongue  every 5 (five) minutes as needed for chest pain. 25 tablet 3  ? rosuvastatin (CRESTOR) 40 MG tablet Take 1 tablet by mouth once daily (Patient taking differently: Take 40 mg by mouth daily.) 180 tablet 2  ? ?No current facility-administered medications for this visit.  ? ? ?Family History  ?Adopted: Yes  ? ? ?Social History  ? ?Socioeconomic History  ? Marital status: Divorced  ?  Spouse name: Not on file  ? Number of children: Not on file  ? Years of education: Not on file  ? Highest education level: Not on file  ?Occupational History  ? Not on file  ?Tobacco Use  ? Smoking status: Former  ? Smokeless tobacco: Never  ?Vaping Use  ? Vaping Use: Former  ?Substance and Sexual Activity  ? Alcohol use: Not Currently  ? Drug use: No  ? Sexual activity: Not on file  ?Other Topics Concern  ?  Not on file  ?Social History Narrative  ? Not on file  ? ?Social Determinants of Health  ? ?Financial Resource Strain: Not on file  ?Food Insecurity: Not on file  ?Transportation Needs: Not on file  ?Physical Activity: Not on file  ?Stress: Not on file  ?Social Connections: Not on file  ?Intimate Partner Violence: Not on file  ? ? ? ?REVIEW OF SYSTEMS:  ? ?[X]  denotes positive finding, [ ]  denotes negative finding ?Cardiac  Comments:  ?Chest pain or chest pressure:    ?Shortness of breath upon exertion:    ?Short of breath when lying flat:    ?Irregular heart rhythm:    ?    ?Vascular    ?Pain in calf, thigh, or hip brought on by ambulation:    ?Pain in feet at night that wakes you up from your sleep:     ?Blood clot in your veins:    ?Leg swelling:     ?    ?Pulmonary    ?Oxygen at home:    ?Productive cough:     ?Wheezing:     ?    ?Neurologic    ?Sudden weakness in arms or legs:     ?Sudden numbness in arms or legs:     ?Sudden onset of difficulty speaking or slurred speech:    ?Temporary loss of vision in one eye:     ?Problems with dizziness:     ?    ?Gastrointestinal    ?Blood in stool:     ?Vomited blood:     ?     ?Genitourinary    ?Burning when urinating:     ?Blood in urine:    ?    ?Psychiatric    ?Major depression:     ?    ?Hematologic    ?Bleeding problems:    ?Problems with blood clotting too easily:    ?    ?Skin    ?Rashes or u

## 2021-06-28 ENCOUNTER — Encounter: Payer: Self-pay | Admitting: Orthopedic Surgery

## 2021-06-28 NOTE — Progress Notes (Signed)
? ?Office Visit Note ?  ?Patient: Jorge Clements           ?Date of Birth: 1946-03-29           ?MRN: [id number] ?Visit Date: 06/25/2021 ?             ?Requested by: Benita Stabile, MD ?[address] ?[address] [place] ?[place],  [place] [address] ?PCP: Benita Stabile, MD ? ?Chief Complaint  ?Patient presents with  ? Right Foot - Wound Check, Follow-up  ? ? ? ? ?HPI: ?Patient is a 76 year old gentleman who presents in follow-up for ulceration right foot status post transmetatarsal amputation currently wearing compression stockings. ? ?Assessment & Plan: ?Visit Diagnoses:  ?1. Non-pressure chronic ulcer of other part of right foot limited to breakdown of skin (HCC)   ?2. History of transmetatarsal amputation of right foot (HCC)   ? ? ?Plan: Continue knee-high compression stockings with protective shoe wear. ? ?Follow-Up Instructions: Return in about 3 months (around 09/25/2021).  ? ?Ortho Exam ? ?Patient is alert, oriented, no adenopathy, well-dressed, normal affect, normal respiratory effort. ?Examination the dermatitis is improving there is no drainage the ulcer has healed.  After informed consent the callus was debrided there is no exposed bone or tendon there is good epithelialization.  Patient does have venous and lymphatic swelling with no drainage or ulceration. ? ?Imaging: ?No results found. ?No images are attached to the encounter. ? ?Labs: ?Lab Results  ?Component Value Date  ? HGBA1C 6.4 (H) 12/02/2019  ? ESRSEDRATE 28 (H) 01/18/2021  ? ESRSEDRATE 40 (H) 12/24/2020  ? CRP 11.1 (H) 01/18/2021  ? CRP 1.9 (H) 12/24/2020  ? REPTSTATUS 12/27/2020 FINAL 12/24/2020  ? GRAMSTAIN NO WBC SEEN ?NO ORGANISMS SEEN ? 12/24/2020  ? CULT  12/24/2020  ?  FEW STAPHYLOCOCCUS AUREUS ?WITHIN MIXED ORGANISMS ?Performed at [facility], [address], [place], [place] [address] ?  ? LABORGA STAPHYLOCOCCUS AUREUS 12/24/2020  ? ? ? ?Lab Results  ?Component Value Date  ? ALBUMIN 3.9 12/01/2019  ? ALBUMIN 3.7 09/26/2017  ? ALBUMIN 2.3 (L) 08/17/2017   ? ? ?Lab Results  ?Component Value Date  ? MG 2.2 08/17/2017  ? MG 1.8 08/15/2017  ? ?No results found for: VD25OH ? ?No results found for: PREALBUMIN ?CBC EXTENDED Latest Ref Rng & Units 04/15/2021 04/14/2021 04/13/2021  ?WBC 4.0 - 10.5 K/uL 6.1 7.6 7.9  ?RBC 4.22 - 5.81 MIL/uL 4.42 4.67 4.76  ?HGB 13.0 - 17.0 g/dL 12.3(L) 13.3 13.2  ?HCT 39.0 - 52.0 % 38.8(L) 41.9 42.2  ?PLT 150 - 400 K/uL 96(L) 101(L) 113(L)  ?NEUTROABS 1.7 - 7.7 K/uL - - -  ?LYMPHSABS 0.7 - 4.0 K/uL - - -  ? ? ? ?There is no height or weight on file to calculate BMI. ? ?Orders:  ?No orders of the defined types were placed in this encounter. ? ?No orders of the defined types were placed in this encounter. ? ? ? Procedures: ?No procedures performed ? ?Clinical Data: ?No additional findings. ? ?ROS: ? ?All other systems negative, except as noted in the HPI. ?Review of Systems ? ?Objective: ?Vital Signs: There were no vitals taken for this visit. ? ?Specialty Comments:  ?No specialty comments available. ? ?PMFS History: ?Patient Active Problem List  ? Diagnosis Date Noted  ? Chest pain 04/14/2021  ? CAD (coronary artery disease)   ? Unstable angina (HCC)   ? Venous insufficiency (chronic) (peripheral)   ? Ulcer of right foot limited to  breakdown of skin (HCC)   ? Chest pain, rule out acute myocardial infarction 12/23/2020  ? PAD (peripheral artery disease) (HCC) 12/23/2020  ? HLD (hyperlipidemia) 12/23/2020  ? ACS (acute coronary syndrome) (HCC) 12/01/2019  ? Non-ST elevation (NSTEMI) myocardial infarction (HCC) 12/01/2019  ? NSTEMI (non-ST elevated myocardial infarction) (HCC) 12/01/2019  ? History of transmetatarsal amputation of right foot (HCC)   ? Subacute osteomyelitis, right ankle and foot (HCC)   ? Infected blister of foot 08/13/2017  ? Pressure injury of skin 08/12/2017  ? Weakness 08/11/2017  ? COPD (chronic obstructive pulmonary disease) (HCC) 08/11/2017  ? Cellulitis of right lower extremity 08/11/2017  ? Fall at home, initial encounter  08/11/2017  ? Thrombocytopenia (HCC) 08/11/2017  ? Weakness generalized 08/11/2017  ? Hypokalemia 08/11/2017  ? Essential hypertension 08/11/2017  ? ?Past Medical History:  ?Diagnosis Date  ? Abdominal aortic aneurysm   ? no AAA on [place] in 2020  ? CAD (coronary artery disease)   ? Stent to RCA 2021  ? Chronic obstructive pulmonary disease   ? HLD (hyperlipidemia)   ? HTN (hypertension)   ? Peripheral artery disease   ? s/p R fem pop // s/p R transmet amp  ?  ?Family History  ?Adopted: Yes  ?  ?Past Surgical History:  ?Procedure Laterality Date  ? ABDOMINAL AORTOGRAM N/A 08/17/2017  ? Procedure: ABDOMINAL AORTOGRAM;  Surgeon: Maeola Harman, MD;  Location: [facility] INVASIVE CV LAB;  Service: Cardiovascular;  Laterality: N/A;  ? AMPUTATION Right 08/21/2017  ? Procedure: TRANSMETATARSAL AMPUTATION RIGHT FOOT;  Surgeon: Nadara Mustard, MD;  Location: [facility] OR;  Service: Orthopedics;  Laterality: Right;  ? AMPUTATION TOE Right 08/18/2017  ? Procedure: AMPUTATION RIGHT SECOND AND THIRD TOES;  Surgeon: Maeola Harman, MD;  Location: [facility] OR;  Service: Vascular;  Laterality: Right;  ? CORONARY BALLOON ANGIOPLASTY N/A 12/03/2019  ? Procedure: CORONARY BALLOON ANGIOPLASTY;  Surgeon: Corky Crafts, MD;  Location: [facility] INVASIVE CV LAB;  Service: Cardiovascular;  Laterality: N/A;  ? CORONARY STENT INTERVENTION N/A 12/03/2019  ? Procedure: CORONARY STENT INTERVENTION;  Surgeon: Corky Crafts, MD;  Location: [facility] INVASIVE CV LAB;  Service: Cardiovascular;  Laterality: N/A;  ? ENDARTERECTOMY FEMORAL Right 08/18/2017  ? Procedure: ENDARTERECTOMY RIGHT SUPERFICIAL FEMORAL/PROFUNDA;  Surgeon: Maeola Harman, MD;  Location: [facility] OR;  Service: Vascular;  Laterality: Right;  ? FEMORAL-POPLITEAL BYPASS GRAFT Right 08/18/2017  ? Procedure: BYPASS GRAFT RIGHT COMMON FEMORAL TO ABOVE KNEE POPLITEAL ARTERY USING RIGHT REVERSED GREAT SAPHENOUS VEIN;  Surgeon: Maeola Harman, MD;  Location: [facility] OR;  Service: Vascular;   Laterality: Right;  ? LEFT HEART CATH AND CORONARY ANGIOGRAPHY N/A 12/03/2019  ? Procedure: LEFT HEART CATH AND CORONARY ANGIOGRAPHY;  Surgeon: Corky Crafts, MD;  Location: [facility] INVASIVE CV LAB;  Service: Cardiovascular;  Laterality: N/A;  ? LEFT HEART CATH AND CORONARY ANGIOGRAPHY N/A 04/14/2021  ? Procedure: LEFT HEART CATH AND CORONARY ANGIOGRAPHY;  Surgeon: Corky Crafts, MD;  Location: [facility] INVASIVE CV LAB;  Service: Cardiovascular;  Laterality: N/A;  ? LOWER EXTREMITY ANGIOGRAPHY Right 08/17/2017  ? Procedure: Lower Extremity Angiography;  Surgeon: Maeola Harman, MD;  Location: [facility] INVASIVE CV LAB;  Service: Cardiovascular;  Laterality: Right;  ? PERIPHERAL VASCULAR BALLOON ANGIOPLASTY Right 08/17/2017  ? Procedure: PERIPHERAL VASCULAR BALLOON ANGIOPLASTY;  Surgeon: Maeola Harman, MD;  Location: [facility] INVASIVE CV LAB;  Service: Cardiovascular;  Laterality: Right;  SFA UNABLE TO CROSS  ? VEIN HARVEST Right 08/18/2017  ? Procedure: VEIN  HARVEST RIGHT GREAT SAPHENOUS;  Surgeon: Maeola Harmanain, Brandon Christopher, MD;  [facility];  Service: Vascular;  Laterality: Right;  ? WOUND DEBRIDEMENT Right 08/18/2017  ? Procedure: DEBRIDEMENT WOUND RIGHT FOOT;  Surgeon: Maeola Harmanain, Brandon Christopher, MD;  [facility];  Service: Vascular;  Laterality: Right;  ? ?Social History  ? ?Occupational History  ? Not on file  ?Tobacco Use  ? Smoking status: Former  ? Smokeless tobacco: Never  ?Vaping Use  ? Vaping Use: Former  ?Substance and Sexual Activity  ? Alcohol use: Not Currently  ? Drug use: No  ? Sexual activity: Not on file  ? ? ? ? ? ?

## 2021-06-30 ENCOUNTER — Ambulatory Visit (HOSPITAL_COMMUNITY)
Admission: RE | Admit: 2021-06-30 | Discharge: 2021-06-30 | Disposition: A | Discharge status: Home / Self Care | Payer: Medicare HMO | Source: Ambulatory Visit | Attending: Physician Assistant | Admitting: Physician Assistant

## 2021-06-30 ENCOUNTER — Ambulatory Visit: Payer: Medicare HMO | Admitting: Physician Assistant

## 2021-06-30 ENCOUNTER — Encounter: Payer: Self-pay | Admitting: Physician Assistant

## 2021-06-30 ENCOUNTER — Ambulatory Visit (INDEPENDENT_AMBULATORY_CARE_PROVIDER_SITE_OTHER)
Admission: RE | Admit: 2021-06-30 | Discharge: 2021-06-30 | Disposition: A | Discharge status: Home / Self Care | Payer: Medicare HMO | Source: Ambulatory Visit | Attending: Physician Assistant | Admitting: Physician Assistant

## 2021-06-30 ENCOUNTER — Other Ambulatory Visit: Payer: Self-pay

## 2021-06-30 VITALS — BP 125/66 | HR 64 | Temp 98.0°F | Resp 18 | Ht 72.0 in | Wt 279.1 lb

## 2021-06-30 DIAGNOSIS — I739 Peripheral vascular disease, unspecified: Secondary | ICD-10-CM

## 2021-07-01 ENCOUNTER — Other Ambulatory Visit: Payer: Self-pay

## 2021-07-09 ENCOUNTER — Telehealth: Payer: Self-pay

## 2021-07-09 MED ORDER — ISOSORBIDE MONONITRATE ER 30 MG PO TB24: 30.0000 mg | ORAL_TABLET | Freq: Every day | ORAL | 3 refills | Status: DC

## 2021-07-09 NOTE — Telephone Encounter (Signed)
Refill request for Imdur 30 mg tablets approved and sent to [facility] per pt request.  ?

## 2021-07-13 ENCOUNTER — Encounter (HOSPITAL_COMMUNITY)
Admission: RE | Disposition: A | Discharge status: Home / Self Care | Payer: Self-pay | Source: Home / Self Care | Attending: Vascular Surgery

## 2021-07-13 ENCOUNTER — Encounter (HOSPITAL_COMMUNITY): Payer: Self-pay | Admitting: Vascular Surgery

## 2021-07-13 ENCOUNTER — Ambulatory Visit (HOSPITAL_COMMUNITY)
Admission: RE | Admit: 2021-07-13 | Discharge: 2021-07-13 | Disposition: A | Discharge status: Home / Self Care | Payer: Medicare HMO | Attending: Vascular Surgery | Admitting: Vascular Surgery

## 2021-07-13 ENCOUNTER — Other Ambulatory Visit: Payer: Self-pay

## 2021-07-13 DIAGNOSIS — E785 Hyperlipidemia, unspecified: Secondary | ICD-10-CM | POA: Insufficient documentation

## 2021-07-13 DIAGNOSIS — Z87891 Personal history of nicotine dependence: Secondary | ICD-10-CM | POA: Diagnosis not present

## 2021-07-13 DIAGNOSIS — I70211 Atherosclerosis of native arteries of extremities with intermittent claudication, right leg: Secondary | ICD-10-CM | POA: Insufficient documentation

## 2021-07-13 DIAGNOSIS — Z955 Presence of coronary angioplasty implant and graft: Secondary | ICD-10-CM | POA: Insufficient documentation

## 2021-07-13 DIAGNOSIS — Z79899 Other long term (current) drug therapy: Secondary | ICD-10-CM | POA: Insufficient documentation

## 2021-07-13 DIAGNOSIS — I10 Essential (primary) hypertension: Secondary | ICD-10-CM | POA: Insufficient documentation

## 2021-07-13 DIAGNOSIS — T82858A Stenosis of vascular prosthetic devices, implants and grafts, initial encounter: Secondary | ICD-10-CM | POA: Diagnosis not present

## 2021-07-13 DIAGNOSIS — Z7902 Long term (current) use of antithrombotics/antiplatelets: Secondary | ICD-10-CM | POA: Insufficient documentation

## 2021-07-13 DIAGNOSIS — I2510 Atherosclerotic heart disease of native coronary artery without angina pectoris: Secondary | ICD-10-CM | POA: Diagnosis not present

## 2021-07-13 DIAGNOSIS — J449 Chronic obstructive pulmonary disease, unspecified: Secondary | ICD-10-CM | POA: Insufficient documentation

## 2021-07-13 HISTORY — PX: ABDOMINAL AORTOGRAM W/LOWER EXTREMITY: CATH118223

## 2021-07-13 LAB — POCT I-STAT, CHEM 8
BUN: 20 mg/dL (ref 8–23)
Calcium, Ion: 1.22 mmol/L (ref 1.15–1.40)
Chloride: 100 mmol/L (ref 98–111)
Creatinine, Ser: 1.2 mg/dL (ref 0.61–1.24)
Glucose, Bld: 141 mg/dL — ABNORMAL HIGH (ref 70–99)
HCT: 40 % (ref 39.0–52.0)
Hemoglobin: 13.6 g/dL (ref 13.0–17.0)
Potassium: 4.6 mmol/L (ref 3.5–5.1)
Sodium: 138 mmol/L (ref 135–145)
TCO2: 30 mmol/L (ref 22–32)

## 2021-07-13 SURGERY — ABDOMINAL AORTOGRAM W/LOWER EXTREMITY
Anesthesia: LOCAL | Laterality: Right

## 2021-07-13 MED ORDER — SODIUM CHLORIDE 0.9% FLUSH: 3.0000 mL | INTRAVENOUS | Status: DC | PRN

## 2021-07-13 MED ORDER — SODIUM CHLORIDE 0.9 % IV SOLN: INTRAVENOUS | Status: DC

## 2021-07-13 MED ORDER — LIDOCAINE HCL (PF) 1 % IJ SOLN
INTRAMUSCULAR | Status: AC
  Filled 2021-07-13: qty 30

## 2021-07-13 MED ORDER — ONDANSETRON HCL 4 MG/2ML IJ SOLN
4.0000 mg | Freq: Four times a day (QID) | INTRAMUSCULAR | Status: DC | PRN
Start: 2021-07-13 — End: 2021-07-13

## 2021-07-13 MED ORDER — LABETALOL HCL 5 MG/ML IV SOLN: 10.0000 mg | INTRAVENOUS | Status: DC | PRN

## 2021-07-13 MED ORDER — HEPARIN (PORCINE) IN NACL 1000-0.9 UT/500ML-% IV SOLN
INTRAVENOUS | Status: AC
  Filled 2021-07-13: qty 1000

## 2021-07-13 MED ORDER — IODIXANOL 320 MG/ML IV SOLN
INTRAVENOUS | Status: DC | PRN
  Administered 2021-07-13: 70 mL

## 2021-07-13 MED ORDER — SODIUM CHLORIDE 0.9 % IV SOLN: 250.0000 mL | INTRAVENOUS | Status: DC | PRN

## 2021-07-13 MED ORDER — LIDOCAINE HCL (PF) 1 % IJ SOLN
INTRAMUSCULAR | Status: DC | PRN
  Administered 2021-07-13: 15 mL

## 2021-07-13 MED ORDER — HEPARIN (PORCINE) IN NACL 1000-0.9 UT/500ML-% IV SOLN
INTRAVENOUS | Status: DC | PRN
  Administered 2021-07-13 (×2): 500 mL

## 2021-07-13 MED ORDER — ACETAMINOPHEN 325 MG PO TABS: 650.0000 mg | ORAL_TABLET | ORAL | Status: DC | PRN

## 2021-07-13 MED ORDER — HYDRALAZINE HCL 20 MG/ML IJ SOLN: 5.0000 mg | INTRAMUSCULAR | Status: DC | PRN

## 2021-07-13 MED ORDER — SODIUM CHLORIDE 0.9% FLUSH: 3.0000 mL | Freq: Two times a day (BID) | INTRAVENOUS | Status: DC

## 2021-07-13 SURGICAL SUPPLY — 11 items
CATH OMNI FLUSH 5F 65CM (CATHETERS) ×1 IMPLANT
CATH STRAIGHT 5FR 65CM (CATHETERS) ×1 IMPLANT
GUIDEWIRE ANGLED .035X150CM (WIRE) ×1 IMPLANT
KIT MICROPUNCTURE NIT STIFF (SHEATH) ×1 IMPLANT
KIT PV (KITS) ×2 IMPLANT
SHEATH PINNACLE 5F 10CM (SHEATH) ×1 IMPLANT
SYR MEDRAD MARK 7 150ML (SYRINGE) ×2 IMPLANT
TRANSDUCER W/STOPCOCK (MISCELLANEOUS) ×2 IMPLANT
TRAY PV CATH (CUSTOM PROCEDURE TRAY) ×2 IMPLANT
WIRE BENTSON .035X145CM (WIRE) ×1 IMPLANT
WIRE ROSEN-J .035X180CM (WIRE) ×1 IMPLANT

## 2021-07-13 NOTE — Op Note (Signed)
? ? ?  Patient name: Jorge Clements MRN: [id number] DOB: 08-Aug-1945 Sex: male ? ?07/13/2021 ?Pre-operative Diagnosis: Right lower extremity bypass graft stenosis ?Post-operative diagnosis:  Same ?Surgeon:  Eda Paschal. Donzetta Matters, MD ?Procedure Performed: ?1.  Ultrasound-guided cannulation left common femoral artery ?2.  Aortogram ?3.  Selection of right common femoral artery and right lower extremity angiography with pullback gradient evaluation of right common femoral artery, right external and common iliac arteries ?4.  Mynx device closure left common femoral artery ? ? ?Indications: 76 year old male with a history of a right lower extremity bypass with a concomitant transmetatarsal amputation performed in 2019.  He is well-healed from this although having some fungal issues on the right foot followed by Dr. Sharol Given.  He is found to have high-grade stenosis of the inflow of the bypass graft and is now indicated for angiography with possible intervention. ? ?Findings: The aorta and iliac segments are ectatic there is some calcification but no identifiable flow-limiting stenoses.  There is 0 pullback gradient from the right common femoral artery through the right external iliac and common iliac arteries.  The bypass graft is a very large vein that is patent to the above-knee popliteal artery which is also patent to the below-knee popliteal artery.  The anterior tibial artery is the dominant runoff without stenosis.  There appears to be tibioperoneal trunk stenosis reconstitutes very brisk peroneal and posterior tibial arteries.  No intervention was undertaken. ?  ?Procedure:  The patient was identified in the holding area and taken to room 8.  The patient was then placed supine on the table and prepped and draped in the usual sterile fashion.  A time out was called.  Ultrasound was used to evaluate the left common femoral artery.  There is anesthetized 1% lidocaine cannulated micropuncture needle followed by wire sheath.  Images  saved the permanent record.  Bentson wires placed followed by 5 French sheath and Omni catheter to the level of L1 and aortogram was performed followed by pelvic angiography with the catheter retracted to the aortic bifurcation.  We then crossed the bifurcation with Glidewire and Omni catheter perform right lower extremity angiography.  With the above findings I elected pullback gradient where I placed an Rosen wire into the bypass graft placed the catheter into the common femoral artery perform pullback gradient of which there was very minimal gradient and elected no intervention.  Catheter was removed.  Minx device was deployed in the left common femoral artery.  He tolerated procedure without any complication. ? ? ?Contrast: 70cc ? ?Chloey Ricard C. Donzetta Matters, MD ?[facility] ?Office: [phone] ?Pager: [phone] ? ? ?

## 2021-07-13 NOTE — Interval H&P Note (Signed)
History and Physical Interval Note: ? ?07/13/2021 ?8:35 AM ? ?Jorge Clements  has presented today for surgery, with the diagnosis of right lower extremity foot wound.  The various methods of treatment have been discussed with the patient and family. After consideration of risks, benefits and other options for treatment, the patient has consented to  Procedure(s): ?ABDOMINAL AORTOGRAM W/LOWER EXTREMITY (N/A) as a surgical intervention.  The patient's history has been reviewed, patient examined, no change in status, stable for surgery.  I have reviewed the patient's chart and labs.  Questions were answered to the patient's satisfaction.   ? ? ?Jorge Clements ? ? ?

## 2021-08-05 ENCOUNTER — Other Ambulatory Visit: Payer: Self-pay | Admitting: Cardiology

## 2021-08-09 ENCOUNTER — Other Ambulatory Visit: Payer: Self-pay

## 2021-08-09 ENCOUNTER — Emergency Department (HOSPITAL_COMMUNITY)
Admission: EM | Admit: 2021-08-09 | Discharge: 2021-08-10 | Disposition: A | Discharge status: Home / Self Care | Payer: Medicare HMO | Attending: Emergency Medicine | Admitting: Emergency Medicine

## 2021-08-09 ENCOUNTER — Encounter (HOSPITAL_COMMUNITY): Payer: Self-pay

## 2021-08-09 DIAGNOSIS — I2510 Atherosclerotic heart disease of native coronary artery without angina pectoris: Secondary | ICD-10-CM | POA: Diagnosis not present

## 2021-08-09 DIAGNOSIS — Z87891 Personal history of nicotine dependence: Secondary | ICD-10-CM | POA: Diagnosis not present

## 2021-08-09 DIAGNOSIS — Z955 Presence of coronary angioplasty implant and graft: Secondary | ICD-10-CM | POA: Diagnosis not present

## 2021-08-09 DIAGNOSIS — R42 Dizziness and giddiness: Secondary | ICD-10-CM

## 2021-08-09 DIAGNOSIS — I10 Essential (primary) hypertension: Secondary | ICD-10-CM | POA: Insufficient documentation

## 2021-08-09 NOTE — ED Triage Notes (Signed)
Pt states he has felt dizzy today, took his BP at home and it was 208/79. BP is 147/68 in triage. Pt states he took his blood pressure medication at 1830 tonight but felt he was dizzy because of his BP and wanted to be evaluated. Also c/o right foot pain.   ?

## 2021-08-10 LAB — BASIC METABOLIC PANEL
Anion gap: 7 (ref 5–15)
BUN: 17 mg/dL (ref 8–23)
CO2: 28 mmol/L (ref 22–32)
Calcium: 9.4 mg/dL (ref 8.9–10.3)
Chloride: 102 mmol/L (ref 98–111)
Creatinine, Ser: 1.01 mg/dL (ref 0.61–1.24)
GFR, Estimated: >60 mL/min (ref >60)
Glucose, Bld: 124 mg/dL — ABNORMAL HIGH (ref 70–99)
Potassium: 3.9 mmol/L (ref 3.5–5.1)
Sodium: 137 mmol/L (ref 135–145)

## 2021-08-10 LAB — CBC
HCT: 40.9 % (ref 39.0–52.0)
Hemoglobin: 13.5 g/dL (ref 13.0–17.0)
MCH: 30.1 pg (ref 26.0–34.0)
MCHC: 33 g/dL (ref 30.0–36.0)
MCV: 91.1 fL (ref 80.0–100.0)
Platelets: 115 10*3/uL — ABNORMAL LOW (ref 150–400)
RBC: 4.49 MIL/uL (ref 4.22–5.81)
RDW: 15.3 % (ref 11.5–15.5)
WBC: 8.2 10*3/uL (ref 4.0–10.5)
nRBC: 0 % (ref 0.0–0.2)

## 2021-08-10 LAB — TROPONIN I (HIGH SENSITIVITY): Troponin I (High Sensitivity): 3 ng/L (ref <18)

## 2021-08-10 NOTE — Discharge Instructions (Signed)
You were evaluated in the Emergency Department and after careful evaluation, we did not find any emergent condition requiring admission or further testing in the hospital. ? ?Your exam/testing today was overall reassuring.  Recommend follow-up with your regular doctor to discuss your emergency department visit. ? ?Please return to the Emergency Department if you experience any worsening of your condition.  Thank you for allowing [place] to be a part of your care. ? ?

## 2021-08-10 NOTE — ED Provider Notes (Signed)
?[facility]-EMERGENCY DEPT ?[facility] Emergency Department ?Provider Note ?MRN:  [id number]  ?Arrival date & time: 08/10/21    ? ?Chief Complaint   ?Dizziness ?  ?History of Present Illness   ?Jorge Clements is a 76 y.o. year-old male with a history of CAD, PAD presenting to the ED with chief complaint of dizziness. ? ?Persistent dizziness today.  Concerned about his high blood pressure at home.  Unable to describe the dizziness any further, unsure if it is lightheadedness or room spinning.  Denies chest pain or shortness of breath.  Had similar sensation during his prior heart attack. ? ?Also with some pain to his right foot stump. ? ?Review of Systems  ?A thorough review of systems was obtained and all systems are negative except as noted in the HPI and PMH.  ? ?Patient's Health History   ? ?Past Medical History:  ?Diagnosis Date  ? Abdominal aortic aneurysm   ? no AAA on [place] in 2020  ? CAD (coronary artery disease)   ? Stent to RCA 2021  ? Chronic obstructive pulmonary disease   ? HLD (hyperlipidemia)   ? HTN (hypertension)   ? Peripheral artery disease   ? s/p R fem pop // s/p R transmet amp  ?  ?Past Surgical History:  ?Procedure Laterality Date  ? ABDOMINAL AORTOGRAM N/A 08/17/2017  ? Procedure: ABDOMINAL AORTOGRAM;  Surgeon: Maeola Harman, MD;  Location: [facility] INVASIVE CV LAB;  Service: Cardiovascular;  Laterality: N/A;  ? ABDOMINAL AORTOGRAM W/LOWER EXTREMITY Right 07/13/2021  ? Procedure: ABDOMINAL AORTOGRAM W/LOWER EXTREMITY;  Surgeon: Maeola Harman, MD;  Location: [facility] INVASIVE CV LAB;  Service: Cardiovascular;  Laterality: Right;  ? AMPUTATION Right 08/21/2017  ? Procedure: TRANSMETATARSAL AMPUTATION RIGHT FOOT;  Surgeon: Nadara Mustard, MD;  Location: [facility] OR;  Service: Orthopedics;  Laterality: Right;  ? AMPUTATION TOE Right 08/18/2017  ? Procedure: AMPUTATION RIGHT SECOND AND THIRD TOES;  Surgeon: Maeola Harman, MD;  Location: [facility] OR;  Service: Vascular;  Laterality: Right;  ?  CORONARY BALLOON ANGIOPLASTY N/A 12/03/2019  ? Procedure: CORONARY BALLOON ANGIOPLASTY;  Surgeon: Corky Crafts, MD;  Location: [facility] INVASIVE CV LAB;  Service: Cardiovascular;  Laterality: N/A;  ? CORONARY STENT INTERVENTION N/A 12/03/2019  ? Procedure: CORONARY STENT INTERVENTION;  Surgeon: Corky Crafts, MD;  Location: [facility] INVASIVE CV LAB;  Service: Cardiovascular;  Laterality: N/A;  ? ENDARTERECTOMY FEMORAL Right 08/18/2017  ? Procedure: ENDARTERECTOMY RIGHT SUPERFICIAL FEMORAL/PROFUNDA;  Surgeon: Maeola Harman, MD;  Location: [facility] OR;  Service: Vascular;  Laterality: Right;  ? FEMORAL-POPLITEAL BYPASS GRAFT Right 08/18/2017  ? Procedure: BYPASS GRAFT RIGHT COMMON FEMORAL TO ABOVE KNEE POPLITEAL ARTERY USING RIGHT REVERSED GREAT SAPHENOUS VEIN;  Surgeon: Maeola Harman, MD;  Location: [facility] OR;  Service: Vascular;  Laterality: Right;  ? LEFT HEART CATH AND CORONARY ANGIOGRAPHY N/A 12/03/2019  ? Procedure: LEFT HEART CATH AND CORONARY ANGIOGRAPHY;  Surgeon: Corky Crafts, MD;  Location: [facility] INVASIVE CV LAB;  Service: Cardiovascular;  Laterality: N/A;  ? LEFT HEART CATH AND CORONARY ANGIOGRAPHY N/A 04/14/2021  ? Procedure: LEFT HEART CATH AND CORONARY ANGIOGRAPHY;  Surgeon: Corky Crafts, MD;  Location: [facility] INVASIVE CV LAB;  Service: Cardiovascular;  Laterality: N/A;  ? LOWER EXTREMITY ANGIOGRAPHY Right 08/17/2017  ? Procedure: Lower Extremity Angiography;  Surgeon: Maeola Harman, MD;  Location: [facility] INVASIVE CV LAB;  Service: Cardiovascular;  Laterality: Right;  ? PERIPHERAL VASCULAR BALLOON ANGIOPLASTY Right 08/17/2017  ? Procedure: PERIPHERAL VASCULAR BALLOON ANGIOPLASTY;  Surgeon:  Maeola Harman, MD;  Location: [facility] INVASIVE CV LAB;  Service: Cardiovascular;  Laterality: Right;  SFA UNABLE TO CROSS  ? VEIN HARVEST Right 08/18/2017  ? Procedure: VEIN HARVEST RIGHT GREAT SAPHENOUS;  Surgeon: Maeola Harman, MD;  Location: [facility] OR;  Service: Vascular;   Laterality: Right;  ? WOUND DEBRIDEMENT Right 08/18/2017  ? Procedure: DEBRIDEMENT WOUND RIGHT FOOT;  Surgeon: Maeola Harman, MD;  Location: [facility] OR;  Service: Vascular;  Laterality: Right;  ?  ?Family History  ?Adopted: Yes  ?  ?Social History  ? ?Socioeconomic History  ? Marital status: Divorced  ?  Spouse name: Not on file  ? Number of children: Not on file  ? Years of education: Not on file  ? Highest education level: Not on file  ?Occupational History  ? Not on file  ?Tobacco Use  ? Smoking status: Former  ? Smokeless tobacco: Never  ?Vaping Use  ? Vaping Use: Former  ?Substance and Sexual Activity  ? Alcohol use: Not Currently  ? Drug use: No  ? Sexual activity: Not on file  ?Other Topics Concern  ? Not on file  ?Social History Narrative  ? Not on file  ? ?Social Determinants of Health  ? ?Financial Resource Strain: Not on file  ?Food Insecurity: Not on file  ?Transportation Needs: Not on file  ?Physical Activity: Not on file  ?Stress: Not on file  ?Social Connections: Not on file  ?Intimate Partner Violence: Not on file  ?  ? ?Physical Exam  ? ?Vitals:  ? 08/09/21 2330 08/10/21 0000  ?BP: (!) 131/47   ?Pulse: (!) 57 60  ?Resp: 20 14  ?Temp:    ?SpO2: 95% 97%  ?  ?CONSTITUTIONAL: Chronically ill-appearing, NAD ?NEURO/PSYCH:  Alert and oriented x 3, no focal deficits ?EYES:  eyes equal and reactive ?ENT/NECK:  no LAD, no JVD ?CARDIO: Regular rate, well-perfused, normal S1 and S2 ?PULM:  CTAB no wheezing or rhonchi ?GI/GU:  non-distended, non-tender ?MSK/SPINE: Partial foot amputation on the right ?SKIN: Chronic skin changes to the right foot, no signs of active infection or erythema or purulence ? ? ?*Additional and/or pertinent findings included in MDM below ? ?Diagnostic and Interventional Summary  ? ? EKG Interpretation ? ?Date/Time:  Sunday August 09 2021 22:51:01 EDT ?Ventricular Rate:  59 ?PR Interval:  270 ?QRS Duration: 94 ?QT Interval:  425 ?QTC Calculation: 421 ?R Axis:   -32 ?Text  Interpretation: Sinus rhythm Prolonged PR interval Left axis deviation Anterior infarct, old Confirmed by Kennis Carina [phone]) on 08/10/2021 12:08:47 AM ?  ? ?  ? ?Labs Reviewed  ?CBC - Abnormal; Notable for the following components:  ?    Result Value  ? Platelets 115 (*)   ? All other components within normal limits  ?BASIC METABOLIC PANEL - Abnormal; Notable for the following components:  ? Glucose, Bld 124 (*)   ? All other components within normal limits  ?TROPONIN I (HIGH SENSITIVITY)  ?TROPONIN I (HIGH SENSITIVITY)  ?  ?No orders to display  ?  ?Medications - No data to display  ? ?Procedures  /  Critical Care ?Procedures ? ?ED Course and Medical Decision Making  ?Initial Impression and Ddx ?Patient explains that when he had his heart attack years ago he did not have chest pain and he had isolated dizziness and so ACS is considered.  Awaiting troponin.  EKG is unchanged.  Neurological exam is reassuring, normal and symmetric strength and sensation, normal coordination, normal speech, posterior  circulation stroke is considered given patient's dizziness but felt to be unlikely.  Foot appears chronically unwell but does not seem to have active infection, there is a small split in the skin that is likely causing his discomfort, no indication for imaging or further testing.  Has follow-up with Dr. Lajoyce Cornersuda. ? ?Past medical/surgical history that increases complexity of ED encounter: CAD ? ?Interpretation of Diagnostics ?I personally reviewed the EKG and my interpretation is as follows: Sinus rhythm, no significant change from prior ?   ?Labs are reassuring with no significant blood count or electrolyte disturbance, troponin is negative ? ?Patient Reassessment and Ultimate Disposition/Management ?On reassessment patient is asymptomatic, any dizziness is resolved.  Interested in going home.  Patient never had vertigo, never had observed nystagmus and so posterior circulation stroke seems very unlikely.  Patient never had  chest pain, dizziness started at 5 PM and troponin obtained 1 AM is negative, no need for repeat.  Return precautions provided. ? ?Patient management required discussion with the following services or consulting groups:

## 2021-08-24 ENCOUNTER — Other Ambulatory Visit: Payer: Self-pay | Admitting: *Deleted

## 2021-08-24 ENCOUNTER — Telehealth: Payer: Self-pay | Admitting: *Deleted

## 2021-08-24 DIAGNOSIS — I739 Peripheral vascular disease, unspecified: Secondary | ICD-10-CM

## 2021-08-24 DIAGNOSIS — Z9889 Other specified postprocedural states: Secondary | ICD-10-CM

## 2021-08-24 NOTE — Telephone Encounter (Signed)
Patient called c/o worsening pain and swelling of right lower extremity with redness and sensitivity to touch.  An appointment was scheduled 08/31/2021 for ultrasound studies and appointment for office visit  ?

## 2021-08-31 ENCOUNTER — Ambulatory Visit (INDEPENDENT_AMBULATORY_CARE_PROVIDER_SITE_OTHER)
Admission: RE | Admit: 2021-08-31 | Discharge: 2021-08-31 | Disposition: A | Discharge status: Home / Self Care | Payer: Medicare HMO | Source: Ambulatory Visit | Attending: Surgery | Admitting: Surgery

## 2021-08-31 ENCOUNTER — Ambulatory Visit (HOSPITAL_COMMUNITY)
Admission: RE | Admit: 2021-08-31 | Discharge: 2021-08-31 | Disposition: A | Discharge status: Home / Self Care | Payer: Medicare HMO | Source: Ambulatory Visit | Attending: Surgery | Admitting: Surgery

## 2021-08-31 DIAGNOSIS — Z9889 Other specified postprocedural states: Secondary | ICD-10-CM | POA: Insufficient documentation

## 2021-08-31 DIAGNOSIS — I739 Peripheral vascular disease, unspecified: Secondary | ICD-10-CM | POA: Diagnosis not present

## 2021-09-02 ENCOUNTER — Ambulatory Visit: Payer: Medicare HMO | Admitting: Vascular Surgery

## 2021-09-02 ENCOUNTER — Encounter: Payer: Self-pay | Admitting: Vascular Surgery

## 2021-09-02 VITALS — BP 136/61 | HR 62 | Temp 98.0°F | Resp 20 | Ht 72.0 in | Wt 282.0 lb

## 2021-09-02 DIAGNOSIS — I739 Peripheral vascular disease, unspecified: Secondary | ICD-10-CM | POA: Diagnosis not present

## 2021-09-02 MED ORDER — SULFAMETHOXAZOLE-TRIMETHOPRIM 400-80 MG PO TABS: 1.0000 | ORAL_TABLET | Freq: Two times a day (BID) | ORAL | 0 refills | Status: AC

## 2021-09-02 NOTE — Progress Notes (Signed)
? ?Patient ID: Jorge KusterMichael Clements, male   DOB: 10/10/1945, 76 y.o.   MRN: [id number] ? ?Reason for Consult: Follow-up ?  ?Referred by Benita StabileHall, John Z, MD ? ?Subjective:  ?   ?HPI: ? ?Jorge Clements is a 76 y.o. male previous history of a right lower extremity bypass with concomitant transmetatarsal amputation by Dr. Lajoyce Cornersuda in 2019.  More recently he has been followed with Dr. Lajoyce Cornersuda in his office in September for fungal infection of the right foot which has been slow to resolve.  He did undergo angiography with me for decreased velocities in the right bypass which did not demonstrate any need for intervention.  He now follows up with a few week history of swelling in the right thigh around the previous incision.  He did have a fever last week.  States that the swelling is resolving.  He has associated redness.  No new issues with the foot. ? ?Past Medical History:  ?Diagnosis Date  ? Abdominal aortic aneurysm   ? no AAA on US in 2020  ? CAD (coronary artery disease)   ? Stent to RCA 2021  ? Chronic obstructive pulmonary disease   ? HLD (hyperlipidemia)   ? HTN (hypertension)   ? Peripheral artery disease   ? s/p R fem pop // s/p R transmet amp  ? ?Family History  ?Adopted: Yes  ? ?Past Surgical History:  ?Procedure Laterality Date  ? ABDOMINAL AORTOGRAM N/A 08/17/2017  ? Procedure: ABDOMINAL AORTOGRAM;  Surgeon: Maeola Harmanain, Kiyo Heal Christopher, MD;  [facility];  Service: Cardiovascular;  Laterality: N/A;  ? ABDOMINAL AORTOGRAM W/LOWER EXTREMITY Right 07/13/2021  ? Procedure: ABDOMINAL AORTOGRAM W/LOWER EXTREMITY;  Surgeon: Maeola Harmanain, Izacc Demeyer Christopher, MD;  [facility];  Service: Cardiovascular;  Laterality: Right;  ? AMPUTATION Right 08/21/2017  ? Procedure: TRANSMETATARSAL AMPUTATION RIGHT FOOT;  Surgeon: Nadara Mustarduda, Marcus V, MD;  [facility];  Service: Orthopedics;  Laterality: Right;  ? AMPUTATION TOE Right 08/18/2017  ? Procedure: AMPUTATION RIGHT SECOND AND THIRD TOES;  Surgeon: Maeola Harmanain, Darnise Montag Christopher,  MD;  [facility];  Service: Vascular;  Laterality: Right;  ? CORONARY BALLOON ANGIOPLASTY N/A 12/03/2019  ? Procedure: CORONARY BALLOON ANGIOPLASTY;  Surgeon: Corky CraftsVaranasi, Jayadeep S, MD;  [facility];  Service: Cardiovascular;  Laterality: N/A;  ? CORONARY STENT INTERVENTION N/A 12/03/2019  ? Procedure: CORONARY STENT INTERVENTION;  Surgeon: Corky CraftsVaranasi, Jayadeep S, MD;  [facility];  Service: Cardiovascular;  Laterality: N/A;  ? ENDARTERECTOMY FEMORAL Right 08/18/2017  ? Procedure: ENDARTERECTOMY RIGHT SUPERFICIAL FEMORAL/PROFUNDA;  Surgeon: Maeola Harmanain, Jery Hollern Christopher, MD;  [facility];  Service: Vascular;  Laterality: Right;  ? FEMORAL-POPLITEAL BYPASS GRAFT Right 08/18/2017  ? Procedure: BYPASS GRAFT RIGHT COMMON FEMORAL TO ABOVE KNEE POPLITEAL ARTERY USING RIGHT REVERSED GREAT SAPHENOUS VEIN;  Surgeon: Maeola Harmanain, Chyanna Flock Christopher, MD;  [facility];  Service: Vascular;  Laterality: Right;  ? LEFT HEART CATH AND CORONARY ANGIOGRAPHY N/A 12/03/2019  ? Procedure: LEFT HEART CATH AND CORONARY ANGIOGRAPHY;  Surgeon: Corky CraftsVaranasi, Jayadeep S, MD;  [facility];  Service: Cardiovascular;  Laterality: N/A;  ? LEFT HEART CATH AND CORONARY ANGIOGRAPHY N/A 04/14/2021  ? Procedure: LEFT HEART CATH AND CORONARY ANGIOGRAPHY;  Surgeon: Corky CraftsVaranasi, Jayadeep S, MD;  [facility];  Service: Cardiovascular;  Laterality: N/A;  ? LOWER EXTREMITY ANGIOGRAPHY Right 08/17/2017  ? Procedure: Lower Extremity Angiography;  Surgeon: Maeola Harmanain, Isolde Skaff Christopher, MD;  [facility];  Service: Cardiovascular;  Laterality: Right;  ? PERIPHERAL VASCULAR BALLOON ANGIOPLASTY Right 08/17/2017  ? Procedure: PERIPHERAL VASCULAR BALLOON ANGIOPLASTY;  Surgeon: Maeola Harman, MD;  [facility];  Service: Cardiovascular;  Laterality: Right;  SFA UNABLE TO CROSS  ? VEIN HARVEST Right 08/18/2017  ? Procedure: VEIN HARVEST RIGHT GREAT SAPHENOUS;  Surgeon: Maeola Harman, MD;  Location: [facility] OR;  Service: Vascular;  Laterality: Right;  ? WOUND DEBRIDEMENT Right 08/18/2017  ? Procedure: DEBRIDEMENT WOUND RIGHT FOOT;  Surgeon: Maeola Harman, MD;  Location[facility];  Service: Vascular;  Laterality: Right;  ? ? ?Short Social History:  ?Social History  ? ?Tobacco Use  ? Smoking status: Former  ? Smokeless tobacco: Never  ?Substance Use Topics  ? Alcohol use: Not Currently  ? ? ?Allergies  ?Allergen Reactions  ? Penicillins Hives and Rash  ?  Childhood reaction ?Has patient had a PCN reaction causing immediate rash, facial/tongue/throat swelling, SOB or lightheadedness with hypotension: No ?PT DEVELOPED ## SEVERE ## RASH INVOLVING MUCUS MEMBRANES or SKIN NECROSIS: #  #  YES  #  # ?Has patient had a PCN reaction that required hospitalization: Unknown ?Has patient had a PCN reaction occurring within the last 10 years: No  ? Clindamycin Rash  ?  Diffuse rash across body  ? ? ?Current Outpatient Medications  ?Medication Sig Dispense Refill  ? BREZTRI AEROSPHERE 160-9-4.8 MCG/ACT AERO Inhale 2 puffs into the lungs 2 (two) times daily.    ? clopidogrel (PLAVIX) 75 MG tablet Take 1 tablet (75 mg total) by mouth daily. 30 tablet 1  ? isosorbide mononitrate (IMDUR) 30 MG 24 hr tablet Take 1 tablet (30 mg total) by mouth daily. 30 tablet 3  ? losartan (COZAAR) 50 MG tablet Take 1 tablet by mouth once daily (Patient taking differently: Take 50 mg by mouth daily.) 90 tablet 3  ? metoprolol tartrate (LOPRESSOR) 25 MG tablet Take 1 tablet by mouth twice daily (Patient taking differently: Take 25 mg by mouth 2 (two) times daily.) 180 tablet 1  ? Multiple Vitamins-Minerals (CENTRUM SILVER 50+MEN PO) Take 1 tablet by mouth daily.    ? niacin 500 MG tablet Take 500 mg by mouth daily.    ? nitroGLYCERIN (NITROSTAT) 0.4 MG SL tablet Place 0.4 mg under the tongue every 5 (five) minutes as needed for chest pain.    ? rosuvastatin (CRESTOR) 40 MG tablet Take 1 tablet by mouth once  daily 90 tablet 0  ? Vitamin D-Vitamin K (VITAMIN K2-VITAMIN D3 PO) Take 1 tablet by mouth daily.    ? vitamin E 180 MG (400 UNITS) capsule Take 400 Units by mouth daily.    ? ?No current facility-administered medications for this visit.  ? ? ?Review of Systems  ?Constitutional: Positive for fever.  ?HENT: HENT negative.  ?Eyes: Eyes negative.  ?Respiratory: Respiratory negative.  ?Cardiovascular: Positive for leg swelling.  ?GI: Gastrointestinal negative.  ?Skin: Positive for rash.  ?Neurological: Neurological negative. ?Hematologic: Hematologic/lymphatic negative.  ?Psychiatric: Psychiatric negative.   ? ?   ?Objective:  ?Objective  ? ?Vitals:  ? 09/02/21 1049  ?BP: 136/61  ?Pulse: 62  ?Resp: 20  ?Temp: 98 ?F (36.7 ?C)  ?SpO2: 96%  ?Weight: 282 lb (127.9 kg)  ?Height: 6' (1.829 m)  ? ?Body mass index is 38.25 kg/m?. ? ?Physical Exam ?HENT:  ?   Head: Normocephalic.  ?   Mouth/Throat:  ?   Mouth: Mucous membranes are moist.  ?Eyes:  ?   Pupils: Pupils  are equal, round, and reactive to light.  ?Cardiovascular:  ?   Comments: Strong signal right anterior tibial posterior tibial ?Pulmonary:  ?   Effort: Pulmonary effort is normal.  ?Abdominal:  ?   General: Abdomen is flat.  ?   Palpations: Abdomen is soft.  ?Musculoskeletal:  ?   Comments: Some fullness and redness appreciated on the right medial thigh no tenderness and no warmth  ?Neurological:  ?   Mental Status: He is alert.  ? ? ?Data: ?Right Graft #1: Femoral to AK popliteal  ?+------------------+--------+---------------+----------+-------------------  ?---+  ?                  PSV cm/sStenosis       Waveform  Comments            ?     ?+------------------+--------+---------------+----------+-------------------  ?---+  ?Inflow            403     75-99% stenosisbiphasic  disease not  ?visualized  ?+------------------+--------+---------------+----------+-------------------  ?---+  ?Prox Anastomosis  153                    biphasic                       ?     ?+------------------+--------+---------------+----------+-------------------  ?---+  ?Proximal Graft    70                     biphasic                      ?     ?+------------------+--------+---------------+-----

## 2021-09-08 ENCOUNTER — Other Ambulatory Visit: Payer: Self-pay | Admitting: *Deleted

## 2021-09-08 DIAGNOSIS — I739 Peripheral vascular disease, unspecified: Secondary | ICD-10-CM

## 2021-09-08 DIAGNOSIS — Z9889 Other specified postprocedural states: Secondary | ICD-10-CM

## 2021-09-24 ENCOUNTER — Ambulatory Visit: Payer: Medicare HMO | Admitting: Orthopedic Surgery

## 2021-09-24 DIAGNOSIS — Z89431 Acquired absence of right foot: Secondary | ICD-10-CM | POA: Diagnosis not present

## 2021-09-24 DIAGNOSIS — L97511 Non-pressure chronic ulcer of other part of right foot limited to breakdown of skin: Secondary | ICD-10-CM | POA: Diagnosis not present

## 2021-10-01 ENCOUNTER — Ambulatory Visit: Payer: Medicare HMO | Admitting: Orthopedic Surgery

## 2021-10-01 DIAGNOSIS — L97511 Non-pressure chronic ulcer of other part of right foot limited to breakdown of skin: Secondary | ICD-10-CM

## 2021-10-02 ENCOUNTER — Encounter: Payer: Self-pay | Admitting: Orthopedic Surgery

## 2021-10-02 NOTE — Progress Notes (Signed)
Office Visit Note   Patient: Jorge Clements           Date of Birth: May 19, 1945           MRN: [id number] Visit Date: 09/24/2021              Requested by: Benita Stabile, MD [address] [place],  [place] [address] PCP: Benita Stabile, MD  Chief Complaint  Patient presents with   Right Foot - Wound Check    Hx Transmet amputation      HPI: Patient is a 76 year old gentleman who presents in follow-up for ulceration right transmetatarsal amputation.  Patient is currently wearing compression socks with protective shoe wear.  Patient was last seen about 3 months ago.  Assessment & Plan: Visit Diagnoses:  1. Non-pressure chronic ulcer of other part of right foot limited to breakdown of skin (HCC)   2. History of transmetatarsal amputation of right foot (HCC)     Plan: Ulcer was debrided of skin and soft tissue a Dynaflex wrap is applied.  Follow-Up Instructions: Return in about 1 week (around 10/01/2021).   Ortho Exam  Patient is alert, oriented, no adenopathy, well-dressed, normal affect, normal respiratory effort. Examination patient has increased venous stasis swelling and ulceration of the right leg.  The calf is 49 cm in circumference.  Patient has a ulcer beneath the first metatarsal.  After informed consent a 10 blade knife was used to debride the skin and soft tissue back to healthy viable granulation tissue the ulcer was 3 cm in diameter after debridement 3 mm deep it was 2 cm in diameter prior to debridement.  Imaging: No results found. No images are attached to the encounter.  Labs: Lab Results  Component Value Date   HGBA1C 6.4 (H) 12/02/2019   ESRSEDRATE 28 (H) 01/18/2021   ESRSEDRATE 40 (H) 12/24/2020   CRP 11.1 (H) 01/18/2021   CRP 1.9 (H) 12/24/2020   REPTSTATUS 12/27/2020 FINAL 12/24/2020   GRAMSTAIN NO WBC SEEN NO ORGANISMS SEEN  12/24/2020   CULT  12/24/2020    FEW STAPHYLOCOCCUS AUREUS WITHIN MIXED ORGANISMS Performed at [facility],  [address], [place], [place] [address]    [facility] STAPHYLOCOCCUS AUREUS 12/24/2020     Lab Results  Component Value Date   ALBUMIN 3.9 12/01/2019   ALBUMIN 3.7 09/26/2017   ALBUMIN 2.3 (L) 08/17/2017    Lab Results  Component Value Date   MG 2.2 08/17/2017   MG 1.8 08/15/2017   No results found for: "VD25OH"  No results found for: "PREALBUMIN"    Latest Ref Rng & Units 08/10/2021    1:36 AM 07/13/2021    8:26 AM 04/15/2021    3:45 AM  CBC EXTENDED  WBC 4.0 - 10.5 K/uL 8.2   6.1   RBC 4.22 - 5.81 MIL/uL 4.49   4.42   Hemoglobin 13.0 - 17.0 g/dL 18.2  99.3  71.6   HCT 39.0 - 52.0 % 40.9  40.0  38.8   Platelets 150 - 400 K/uL 115   96      There is no height or weight on file to calculate BMI.  Orders:  No orders of the defined types were placed in this encounter.  No orders of the defined types were placed in this encounter.    Procedures: No procedures performed  Clinical Data: No additional findings.  ROS:  All other systems negative, except as noted in the HPI. Review of Systems  Objective: Vital Signs: There were no vitals taken for this visit.  Specialty Comments:  No specialty comments available.  PMFS History: Patient Active Problem List   Diagnosis Date Noted   Chest pain 04/14/2021   CAD (coronary artery disease)    Unstable angina (HCC)    Venous insufficiency (chronic) (peripheral)    Ulcer of right foot limited to breakdown of skin (HCC)    Chest pain, rule out acute myocardial infarction 12/23/2020   PAD (peripheral artery disease) (HCC) 12/23/2020   HLD (hyperlipidemia) 12/23/2020   ACS (acute coronary syndrome) (HCC) 12/01/2019   Non-ST elevation (NSTEMI) myocardial infarction (HCC) 12/01/2019   NSTEMI (non-ST elevated myocardial infarction) (HCC) 12/01/2019   History of transmetatarsal amputation of right foot (HCC)    Subacute osteomyelitis, right ankle and foot (HCC)    Infected blister of foot 08/13/2017   Pressure injury of  skin 08/12/2017   Weakness 08/11/2017   COPD (chronic obstructive pulmonary disease) (HCC) 08/11/2017   Cellulitis of right lower extremity 08/11/2017   Fall at home, initial encounter 08/11/2017   Thrombocytopenia (HCC) 08/11/2017   Weakness generalized 08/11/2017   Hypokalemia 08/11/2017   Essential hypertension 08/11/2017   Past Medical History:  Diagnosis Date   Abdominal aortic aneurysm    no AAA on [place] in 2020   CAD (coronary artery disease)    Stent to RCA 2021   Chronic obstructive pulmonary disease    HLD (hyperlipidemia)    HTN (hypertension)    Peripheral artery disease    s/p R fem pop // s/p R transmet amp    Family History  Adopted: Yes    Past Surgical History:  Procedure Laterality Date   ABDOMINAL AORTOGRAM N/A 08/17/2017   Procedure: ABDOMINAL AORTOGRAM;  Surgeon: Maeola Harman, MD;  Location: [facility] INVASIVE CV LAB;  Service: Cardiovascular;  Laterality: N/A;   ABDOMINAL AORTOGRAM W/LOWER EXTREMITY Right 07/13/2021   Procedure: ABDOMINAL AORTOGRAM W/LOWER EXTREMITY;  Surgeon: Maeola Harman, MD;  [facility] CV LAB;  Service: Cardiovascular;  Laterality: Right;   AMPUTATION Right 08/21/2017   Procedure: TRANSMETATARSAL AMPUTATION RIGHT FOOT;  Surgeon: Nadara Mustard, MD;  Location: [facility] OR;  Service: Orthopedics;  Laterality: Right;   AMPUTATION TOE Right 08/18/2017   Procedure: AMPUTATION RIGHT SECOND AND THIRD TOES;  Surgeon: Maeola Harman, MD;  Location: [facility] OR;  Service: Vascular;  Laterality: Right;   CORONARY BALLOON ANGIOPLASTY N/A 12/03/2019   Procedure: CORONARY BALLOON ANGIOPLASTY;  Surgeon: Corky Crafts, MD;  [facility];  Service: Cardiovascular;  Laterality: N/A;   CORONARY STENT INTERVENTION N/A 12/03/2019   Procedure: CORONARY STENT INTERVENTION;  Surgeon: Corky Crafts, MD;  Location: [facility] INVASIVE CV LAB;  Service: Cardiovascular;  Laterality: N/A;   ENDARTERECTOMY FEMORAL Right  08/18/2017   Procedure: ENDARTERECTOMY RIGHT SUPERFICIAL Alfredia Ferguson;  Surgeon: Maeola Harman, MD;  Location: [facility] OR;  Service: Vascular;  Laterality: Right;   FEMORAL-POPLITEAL BYPASS GRAFT Right 08/18/2017   Procedure: BYPASS GRAFT RIGHT COMMON FEMORAL TO ABOVE KNEE POPLITEAL ARTERY USING RIGHT REVERSED GREAT SAPHENOUS VEIN;  Surgeon: Maeola Harman, MD;  Location: [facility] OR;  Service: Vascular;  Laterality: Right;   LEFT HEART CATH AND CORONARY ANGIOGRAPHY N/A 12/03/2019   Procedure: LEFT HEART CATH AND CORONARY ANGIOGRAPHY;  Surgeon: Corky Crafts, MD;  Location: [facility] INVASIVE CV LAB;  Service: Cardiovascular;  Laterality: N/A;   LEFT HEART CATH AND CORONARY ANGIOGRAPHY N/A 04/14/2021   Procedure: LEFT HEART CATH AND CORONARY ANGIOGRAPHY;  Surgeon: Corky Crafts, MD;  Location: [facility] INVASIVE CV LAB;  Service: Cardiovascular;  Laterality: N/A;   LOWER EXTREMITY ANGIOGRAPHY Right 08/17/2017   Procedure: Lower Extremity Angiography;  Surgeon: Maeola Harman, MD;  Location: [facility] INVASIVE CV LAB;  Service: Cardiovascular;  Laterality: Right;   PERIPHERAL VASCULAR BALLOON ANGIOPLASTY Right 08/17/2017   Procedure: PERIPHERAL VASCULAR BALLOON ANGIOPLASTY;  Surgeon: Maeola Harman, MD;  Location: [facility] INVASIVE CV LAB;  Service: Cardiovascular;  Laterality: Right;  SFA UNABLE TO CROSS   VEIN HARVEST Right 08/18/2017   Procedure: VEIN HARVEST RIGHT GREAT SAPHENOUS;  Surgeon: Maeola Harman, MD;  Location: [facility] OR;  Service: Vascular;  Laterality: Right;   WOUND DEBRIDEMENT Right 08/18/2017   Procedure: DEBRIDEMENT WOUND RIGHT FOOT;  Surgeon: Maeola Harman, MD;  Location: [facility] OR;  Service: Vascular;  Laterality: Right;   Social History   Occupational History   Not on file  Tobacco Use   Smoking status: Former   Smokeless tobacco: Never  Vaping Use   Vaping Use: Former  Substance and Sexual Activity   Alcohol use: Not Currently   Drug use:  No   Sexual activity: Not on file

## 2021-10-04 ENCOUNTER — Encounter: Payer: Self-pay | Admitting: Orthopedic Surgery

## 2021-10-04 NOTE — Progress Notes (Signed)
Office Visit Note   Patient: Jorge Clements           Date of Birth: 03-11-1946           MRN: [id number] Visit Date: 10/01/2021              Requested by: Benita Stabile, MD [address] [place],  [place] [address] PCP: Benita Stabile, MD  Chief Complaint  Patient presents with   Right Leg - Follow-up      HPI: Patient is a 76 year old gentleman who presents in follow-up for ulceration right foot and right ankle.  Assessment & Plan: Visit Diagnoses:  1. Non-pressure chronic ulcer of other part of right foot limited to breakdown of skin (HCC)     Plan: Wounds are healing nicely.  We will apply a compression wrap.  Follow-Up Instructions: Return in about 1 week (around 10/08/2021).   Ortho Exam  Patient is alert, oriented, no adenop plantar ulcer right foot which is flat healthy granulation tissue.  The medial malleolar ulcer is healing nicely with decreased swelling healthy granulation tissue the skin is wrinkling.  athy, well-dressed, normal affect, normal respiratory effort. Examination patient has a  Imaging: No results found. No images are attached to the encounter.  Labs: Lab Results  Component Value Date   HGBA1C 6.4 (H) 12/02/2019   ESRSEDRATE 28 (H) 01/18/2021   ESRSEDRATE 40 (H) 12/24/2020   CRP 11.1 (H) 01/18/2021   CRP 1.9 (H) 12/24/2020   REPTSTATUS 12/27/2020 FINAL 12/24/2020   GRAMSTAIN NO WBC SEEN NO ORGANISMS SEEN  12/24/2020   CULT  12/24/2020    FEW STAPHYLOCOCCUS AUREUS WITHIN MIXED ORGANISMS Performed at [facility], [address] [place], [place] [address]    [facility] STAPHYLOCOCCUS AUREUS 12/24/2020     Lab Results  Component Value Date   ALBUMIN 3.9 12/01/2019   ALBUMIN 3.7 09/26/2017   ALBUMIN 2.3 (L) 08/17/2017    Lab Results  Component Value Date   MG 2.2 08/17/2017   MG 1.8 08/15/2017   No results found for: "VD25OH"  No results found for: "PREALBUMIN"    Latest Ref Rng & Units 08/10/2021    1:36 AM  07/13/2021    8:26 AM 04/15/2021    3:45 AM  CBC EXTENDED  WBC 4.0 - 10.5 K/uL 8.2   6.1   RBC 4.22 - 5.81 MIL/uL 4.49   4.42   Hemoglobin 13.0 - 17.0 g/dL 69.6  78.9  38.1   HCT 39.0 - 52.0 % 40.9  40.0  38.8   Platelets 150 - 400 K/uL 115   96      There is no height or weight on file to calculate BMI.  Orders:  No orders of the defined types were placed in this encounter.  No orders of the defined types were placed in this encounter.    Procedures: No procedures performed  Clinical Data: No additional findings.  ROS:  All other systems negative, except as noted in the HPI. Review of Systems  Objective: Vital Signs: There were no vitals taken for this visit.  Specialty Comments:  No specialty comments available.  PMFS History: Patient Active Problem List   Diagnosis Date Noted   Chest pain 04/14/2021   CAD (coronary artery disease)    Unstable angina (HCC)    Venous insufficiency (chronic) (peripheral)    Ulcer of right foot limited to breakdown of skin (HCC)    Chest pain, rule out acute myocardial  infarction 12/23/2020   PAD (peripheral artery disease) (HCC) 12/23/2020   HLD (hyperlipidemia) 12/23/2020   ACS (acute coronary syndrome) (HCC) 12/01/2019   Non-ST elevation (NSTEMI) myocardial infarction (HCC) 12/01/2019   NSTEMI (non-ST elevated myocardial infarction) (HCC) 12/01/2019   History of transmetatarsal amputation of right foot (HCC)    Subacute osteomyelitis, right ankle and foot (HCC)    Infected blister of foot 08/13/2017   Pressure injury of skin 08/12/2017   Weakness 08/11/2017   COPD (chronic obstructive pulmonary disease) (HCC) 08/11/2017   Cellulitis of right lower extremity 08/11/2017   Fall at home, initial encounter 08/11/2017   Thrombocytopenia (HCC) 08/11/2017   Weakness generalized 08/11/2017   Hypokalemia 08/11/2017   Essential hypertension 08/11/2017   Past Medical History:  Diagnosis Date   Abdominal aortic aneurysm    no  AAA on [place] in 2020   CAD (coronary artery disease)    Stent to RCA 2021   Chronic obstructive pulmonary disease    HLD (hyperlipidemia)    HTN (hypertension)    Peripheral artery disease    s/p R fem pop // s/p R transmet amp    Family History  Adopted: Yes    Past Surgical History:  Procedure Laterality Date   ABDOMINAL AORTOGRAM N/A 08/17/2017   Procedure: ABDOMINAL AORTOGRAM;  Surgeon: Maeola Harman, MD;  Location: [facility] INVASIVE CV LAB;  Service: Cardiovascular;  Laterality: N/A;   ABDOMINAL AORTOGRAM W/LOWER EXTREMITY Right 07/13/2021   Procedure: ABDOMINAL AORTOGRAM W/LOWER EXTREMITY;  Surgeon: Maeola Harman, MD;  Location: [facility] INVASIVE CV LAB;  Service: Cardiovascular;  Laterality: Right;   AMPUTATION Right 08/21/2017   Procedure: TRANSMETATARSAL AMPUTATION RIGHT FOOT;  Surgeon: Nadara Mustard, MD;  Location: [facility] OR;  Service: Orthopedics;  Laterality: Right;   AMPUTATION TOE Right 08/18/2017   Procedure: AMPUTATION RIGHT SECOND AND THIRD TOES;  Surgeon: Maeola Harman, MD;  Location: [facility] OR;  Service: Vascular;  Laterality: Right;   CORONARY BALLOON ANGIOPLASTY N/A 12/03/2019   Procedure: CORONARY BALLOON ANGIOPLASTY;  Surgeon: Corky Crafts, MD;  Location: [facility] INVASIVE CV LAB;  Service: Cardiovascular;  Laterality: N/A;   CORONARY STENT INTERVENTION N/A 12/03/2019   Procedure: CORONARY STENT INTERVENTION;  Surgeon: Corky Crafts, MD;  Location: [facility] INVASIVE CV LAB;  Service: Cardiovascular;  Laterality: N/A;   ENDARTERECTOMY FEMORAL Right 08/18/2017   Procedure: ENDARTERECTOMY RIGHT SUPERFICIAL Alfredia Ferguson;  Surgeon: Maeola Harman, MD;  Location: [facility] OR;  Service: Vascular;  Laterality: Right;   FEMORAL-POPLITEAL BYPASS GRAFT Right 08/18/2017   Procedure: BYPASS GRAFT RIGHT COMMON FEMORAL TO ABOVE KNEE POPLITEAL ARTERY USING RIGHT REVERSED GREAT SAPHENOUS VEIN;  Surgeon: Maeola Harman, MD;  Location: [facility] OR;  Service:  Vascular;  Laterality: Right;   LEFT HEART CATH AND CORONARY ANGIOGRAPHY N/A 12/03/2019   Procedure: LEFT HEART CATH AND CORONARY ANGIOGRAPHY;  Surgeon: Corky Crafts, MD;  Location: [facility] INVASIVE CV LAB;  Service: Cardiovascular;  Laterality: N/A;   LEFT HEART CATH AND CORONARY ANGIOGRAPHY N/A 04/14/2021   Procedure: LEFT HEART CATH AND CORONARY ANGIOGRAPHY;  Surgeon: Corky Crafts, MD;  Location: [facility] INVASIVE CV LAB;  Service: Cardiovascular;  Laterality: N/A;   LOWER EXTREMITY ANGIOGRAPHY Right 08/17/2017   Procedure: Lower Extremity Angiography;  Surgeon: Maeola Harman, MD;  Location: [facility] INVASIVE CV LAB;  Service: Cardiovascular;  Laterality: Right;   PERIPHERAL VASCULAR BALLOON ANGIOPLASTY Right 08/17/2017   Procedure: PERIPHERAL VASCULAR BALLOON ANGIOPLASTY;  Surgeon: Maeola Harman, MD;  Location: [facility] INVASIVE CV LAB;  Service: Cardiovascular;  Laterality: Right;  SFA UNABLE TO CROSS   VEIN HARVEST Right 08/18/2017   Procedure: VEIN HARVEST RIGHT GREAT SAPHENOUS;  Surgeon: Maeola Harman, MD;  Location: [facility] OR;  Service: Vascular;  Laterality: Right;   WOUND DEBRIDEMENT Right 08/18/2017   Procedure: DEBRIDEMENT WOUND RIGHT FOOT;  Surgeon: Maeola Harman, MD;  Location: [facility] OR;  Service: Vascular;  Laterality: Right;   Social History   Occupational History   Not on file  Tobacco Use   Smoking status: Former   Smokeless tobacco: Never  Vaping Use   Vaping Use: Former  Substance and Sexual Activity   Alcohol use: Not Currently   Drug use: No   Sexual activity: Not on file

## 2021-10-08 ENCOUNTER — Ambulatory Visit: Payer: Medicare HMO | Admitting: Orthopedic Surgery

## 2021-10-08 ENCOUNTER — Encounter: Payer: Self-pay | Admitting: Orthopedic Surgery

## 2021-10-08 DIAGNOSIS — L97511 Non-pressure chronic ulcer of other part of right foot limited to breakdown of skin: Secondary | ICD-10-CM

## 2021-10-08 DIAGNOSIS — Z89431 Acquired absence of right foot: Secondary | ICD-10-CM

## 2021-10-08 MED ORDER — DOXYCYCLINE HYCLATE 100 MG PO TABS: 100.0000 mg | ORAL_TABLET | Freq: Two times a day (BID) | ORAL | 0 refills | Status: DC

## 2021-10-08 NOTE — Progress Notes (Signed)
Office Visit Note   Patient: Jorge Clements           Date of Birth: 09/01/1945           MRN: [id number] Visit Date: 10/08/2021              Requested by: Benita Stabile, MD [address] [place],  [place] [address] PCP: Benita Stabile, MD  Chief Complaint  Patient presents with   Right Foot - Follow-up    Hx TMA      HPI: Patient is a 76 year old gentleman who presents in follow-up for plantar ulcer right leg with venous and lymphatic insufficiency.  The wrap has slid halfway down his leg and has good decrease swelling below the wrap.  Patient is 4 years status post a right transmetatarsal amputation.  Assessment & Plan: Visit Diagnoses:  1. Non-pressure chronic ulcer of other part of right foot limited to breakdown of skin (HCC)   2. History of transmetatarsal amputation of right foot (HCC)     Plan: We will apply a felt relieving donut silver cell Dynaflex wrap start doxycycline.  Follow-Up Instructions: Return in about 1 week (around 10/15/2021).   Ortho Exam  Patient is alert, oriented, no adenopathy, well-dressed, normal affect, normal respiratory effort. Examination patient has significant decrease swelling beneath the compression however the wrap is slid halfway down his leg and he has swelling proximal to the wrap.  The ulcer beneath the first metatarsal now probes 2 cm deep.  There is no redness no cellulitis no purulent drainage.  Patient has a palpable dorsalis pedis pulse.  Imaging: No results found. No images are attached to the encounter.  Labs: Lab Results  Component Value Date   HGBA1C 6.4 (H) 12/02/2019   ESRSEDRATE 28 (H) 01/18/2021   ESRSEDRATE 40 (H) 12/24/2020   CRP 11.1 (H) 01/18/2021   CRP 1.9 (H) 12/24/2020   REPTSTATUS 12/27/2020 FINAL 12/24/2020   GRAMSTAIN NO WBC SEEN NO ORGANISMS SEEN  12/24/2020   CULT  12/24/2020    FEW STAPHYLOCOCCUS AUREUS WITHIN MIXED ORGANISMS Performed at [facility], [address] [place],  [place] [address]    [facility] STAPHYLOCOCCUS AUREUS 12/24/2020     Lab Results  Component Value Date   ALBUMIN 3.9 12/01/2019   ALBUMIN 3.7 09/26/2017   ALBUMIN 2.3 (L) 08/17/2017    Lab Results  Component Value Date   MG 2.2 08/17/2017   MG 1.8 08/15/2017   No results found for: "VD25OH"  No results found for: "PREALBUMIN"    Latest Ref Rng & Units 08/10/2021    1:36 AM 07/13/2021    8:26 AM 04/15/2021    3:45 AM  CBC EXTENDED  WBC 4.0 - 10.5 K/uL 8.2   6.1   RBC 4.22 - 5.81 MIL/uL 4.49   4.42   Hemoglobin 13.0 - 17.0 g/dL 67.1  24.5  80.9   HCT 39.0 - 52.0 % 40.9  40.0  38.8   Platelets 150 - 400 K/uL 115   96      There is no height or weight on file to calculate BMI.  Orders:  No orders of the defined types were placed in this encounter.  No orders of the defined types were placed in this encounter.    Procedures: No procedures performed  Clinical Data: No additional findings.  ROS:  All other systems negative, except as noted in the HPI. Review of Systems  Objective: Vital Signs: There were no  vitals taken for this visit.  Specialty Comments:  No specialty comments available.  PMFS History: Patient Active Problem List   Diagnosis Date Noted   Chest pain 04/14/2021   CAD (coronary artery disease)    Unstable angina (HCC)    Venous insufficiency (chronic) (peripheral)    Ulcer of right foot limited to breakdown of skin (HCC)    Chest pain, rule out acute myocardial infarction 12/23/2020   PAD (peripheral artery disease) (HCC) 12/23/2020   HLD (hyperlipidemia) 12/23/2020   ACS (acute coronary syndrome) (HCC) 12/01/2019   Non-ST elevation (NSTEMI) myocardial infarction (HCC) 12/01/2019   NSTEMI (non-ST elevated myocardial infarction) (HCC) 12/01/2019   History of transmetatarsal amputation of right foot (HCC)    Subacute osteomyelitis, right ankle and foot (HCC)    Infected blister of foot 08/13/2017   Pressure injury of skin 08/12/2017   Weakness  08/11/2017   COPD (chronic obstructive pulmonary disease) (HCC) 08/11/2017   Cellulitis of right lower extremity 08/11/2017   Fall at home, initial encounter 08/11/2017   Thrombocytopenia (HCC) 08/11/2017   Weakness generalized 08/11/2017   Hypokalemia 08/11/2017   Essential hypertension 08/11/2017   Past Medical History:  Diagnosis Date   Abdominal aortic aneurysm    no AAA on [place] in 2020   CAD (coronary artery disease)    Stent to RCA 2021   Chronic obstructive pulmonary disease    HLD (hyperlipidemia)    HTN (hypertension)    Peripheral artery disease    s/p R fem pop // s/p R transmet amp    Family History  Adopted: Yes    Past Surgical History:  Procedure Laterality Date   ABDOMINAL AORTOGRAM N/A 08/17/2017   Procedure: ABDOMINAL AORTOGRAM;  Surgeon: Maeola Harman, MD;  Location: [facility] INVASIVE CV LAB;  Service: Cardiovascular;  Laterality: N/A;   ABDOMINAL AORTOGRAM W/LOWER EXTREMITY Right 07/13/2021   Procedure: ABDOMINAL AORTOGRAM W/LOWER EXTREMITY;  Surgeon: Maeola Harman, MD;  Location: [facility] INVASIVE CV LAB;  Service: Cardiovascular;  Laterality: Right;   AMPUTATION Right 08/21/2017   Procedure: TRANSMETATARSAL AMPUTATION RIGHT FOOT;  Surgeon: Nadara Mustard, MD;  Location: [facility] OR;  Service: Orthopedics;  Laterality: Right;   AMPUTATION TOE Right 08/18/2017   Procedure: AMPUTATION RIGHT SECOND AND THIRD TOES;  Surgeon: Maeola Harman, MD;  Location: [facility] OR;  Service: Vascular;  Laterality: Right;   CORONARY BALLOON ANGIOPLASTY N/A 12/03/2019   Procedure: CORONARY BALLOON ANGIOPLASTY;  Surgeon: Corky Crafts, MD;  Location: [facility] INVASIVE CV LAB;  Service: Cardiovascular;  Laterality: N/A;   CORONARY STENT INTERVENTION N/A 12/03/2019   Procedure: CORONARY STENT INTERVENTION;  Surgeon: Corky Crafts, MD;  Location: [facility] INVASIVE CV LAB;  Service: Cardiovascular;  Laterality: N/A;   ENDARTERECTOMY FEMORAL Right 08/18/2017   Procedure:  ENDARTERECTOMY RIGHT SUPERFICIAL Alfredia Ferguson;  Surgeon: Maeola Harman, MD;  Location: [facility] OR;  Service: Vascular;  Laterality: Right;   FEMORAL-POPLITEAL BYPASS GRAFT Right 08/18/2017   Procedure: BYPASS GRAFT RIGHT COMMON FEMORAL TO ABOVE KNEE POPLITEAL ARTERY USING RIGHT REVERSED GREAT SAPHENOUS VEIN;  Surgeon: Maeola Harman, MD;  Location: [facility] OR;  Service: Vascular;  Laterality: Right;   LEFT HEART CATH AND CORONARY ANGIOGRAPHY N/A 12/03/2019   Procedure: LEFT HEART CATH AND CORONARY ANGIOGRAPHY;  Surgeon: Corky Crafts, MD;  Location: [facility] INVASIVE CV LAB;  Service: Cardiovascular;  Laterality: N/A;   LEFT HEART CATH AND CORONARY ANGIOGRAPHY N/A 04/14/2021   Procedure: LEFT HEART CATH AND CORONARY ANGIOGRAPHY;  Surgeon: Corky Crafts, MD;  Location: [facility] INVASIVE CV LAB;  Service: Cardiovascular;  Laterality: N/A;   LOWER EXTREMITY ANGIOGRAPHY Right 08/17/2017   Procedure: Lower Extremity Angiography;  Surgeon: Maeola Harman, MD;  Location: [facility] INVASIVE CV LAB;  Service: Cardiovascular;  Laterality: Right;   PERIPHERAL VASCULAR BALLOON ANGIOPLASTY Right 08/17/2017   Procedure: PERIPHERAL VASCULAR BALLOON ANGIOPLASTY;  Surgeon: Maeola Harman, MD;  Location: [facility] INVASIVE CV LAB;  Service: Cardiovascular;  Laterality: Right;  SFA UNABLE TO CROSS   VEIN HARVEST Right 08/18/2017   Procedure: VEIN HARVEST RIGHT GREAT SAPHENOUS;  Surgeon: Maeola Harman, MD;  Location: [facility] OR;  Service: Vascular;  Laterality: Right;   WOUND DEBRIDEMENT Right 08/18/2017   Procedure: DEBRIDEMENT WOUND RIGHT FOOT;  Surgeon: Maeola Harman, MD;  Location: [facility] OR;  Service: Vascular;  Laterality: Right;   Social History   Occupational History   Not on file  Tobacco Use   Smoking status: Former   Smokeless tobacco: Never  Vaping Use   Vaping Use: Former  Substance and Sexual Activity   Alcohol use: Not Currently   Drug use: No   Sexual activity:  Not on file

## 2021-10-15 ENCOUNTER — Telehealth: Payer: Self-pay

## 2021-10-15 ENCOUNTER — Ambulatory Visit: Payer: Medicare HMO | Admitting: Orthopedic Surgery

## 2021-10-15 DIAGNOSIS — L97511 Non-pressure chronic ulcer of other part of right foot limited to breakdown of skin: Secondary | ICD-10-CM

## 2021-10-15 MED ORDER — ISOSORBIDE MONONITRATE ER 30 MG PO TB24: 30.0000 mg | ORAL_TABLET | Freq: Every day | ORAL | 3 refills | Status: DC

## 2021-10-15 NOTE — Telephone Encounter (Signed)
Medication refill request for Imdur 30 mg tablets approved and sent to [facility] per pt's request.

## 2021-10-16 ENCOUNTER — Encounter: Payer: Self-pay | Admitting: Orthopedic Surgery

## 2021-10-16 NOTE — Progress Notes (Signed)
Office Visit Note   Patient: Jorge Clements           Date of Birth: 01-16-1946           MRN: [id number] Visit Date: 10/15/2021              Requested by: Benita Stabile, MD [address] [place],  [place] [address] PCP: Benita Stabile, MD  Chief Complaint  Patient presents with   Right Foot - Wound Check    Hx right transmet amputation      HPI: Patient is a 76 year old gentleman who is seen in follow-up status post right transmetatarsal amputation with a plantar ulcer he is been in a Profore compression wrap with a felt relieving donut and silver cell.  Assessment & Plan: Visit Diagnoses:.  We will continue with the felt relieving donut silver cell and Dynaflex. 1. Non-pressure chronic ulcer of other part of right foot limited to breakdown of skin [facility])     Plan: Patient is showing excellent improvement in the ulcer and foot  Follow-Up Instructions: Return in about 1 week (around 10/22/2021).   Ortho Exam  Patient is alert, oriented, no adenopathy, well-dressed, normal affect, normal respiratory effort. Examination there is decreased swelling in the foot and leg good wrinkling of the skin no cellulitis.  The plantar ulcer is 5 mm in diameter and flat.  Imaging: No results found. No images are attached to the encounter.  Labs: Lab Results  Component Value Date   HGBA1C 6.4 (H) 12/02/2019   ESRSEDRATE 28 (H) 01/18/2021   ESRSEDRATE 40 (H) 12/24/2020   CRP 11.1 (H) 01/18/2021   CRP 1.9 (H) 12/24/2020   REPTSTATUS 12/27/2020 FINAL 12/24/2020   GRAMSTAIN NO WBC SEEN NO ORGANISMS SEEN  12/24/2020   CULT  12/24/2020    FEW STAPHYLOCOCCUS AUREUS WITHIN MIXED ORGANISMS Performed at [facility], [address], [place], [place] [address]    [facility] STAPHYLOCOCCUS AUREUS 12/24/2020     Lab Results  Component Value Date   ALBUMIN 3.9 12/01/2019   ALBUMIN 3.7 09/26/2017   ALBUMIN 2.3 (L) 08/17/2017    Lab Results  Component Value Date   MG 2.2  08/17/2017   MG 1.8 08/15/2017   No results found for: "VD25OH"  No results found for: "PREALBUMIN"    Latest Ref Rng & Units 08/10/2021    1:36 AM 07/13/2021    8:26 AM 04/15/2021    3:45 AM  CBC EXTENDED  WBC 4.0 - 10.5 K/uL 8.2   6.1   RBC 4.22 - 5.81 MIL/uL 4.49   4.42   Hemoglobin 13.0 - 17.0 g/dL 73.5  32.9  92.4   HCT 39.0 - 52.0 % 40.9  40.0  38.8   Platelets 150 - 400 K/uL 115   96      There is no height or weight on file to calculate BMI.  Orders:  No orders of the defined types were placed in this encounter.  No orders of the defined types were placed in this encounter.    Procedures: No procedures performed  Clinical Data: No additional findings.  ROS:  All other systems negative, except as noted in the HPI. Review of Systems  Objective: Vital Signs: There were no vitals taken for this visit.  Specialty Comments:  No specialty comments available.  PMFS History: Patient Active Problem List   Diagnosis Date Noted   Chest pain 04/14/2021   CAD (coronary artery disease)  Unstable angina (HCC)    Venous insufficiency (chronic) (peripheral)    Ulcer of right foot limited to breakdown of skin (HCC)    Chest pain, rule out acute myocardial infarction 12/23/2020   PAD (peripheral artery disease) (HCC) 12/23/2020   HLD (hyperlipidemia) 12/23/2020   ACS (acute coronary syndrome) (HCC) 12/01/2019   Non-ST elevation (NSTEMI) myocardial infarction (HCC) 12/01/2019   NSTEMI (non-ST elevated myocardial infarction) (HCC) 12/01/2019   History of transmetatarsal amputation of right foot (HCC)    Subacute osteomyelitis, right ankle and foot (HCC)    Infected blister of foot 08/13/2017   Pressure injury of skin 08/12/2017   Weakness 08/11/2017   COPD (chronic obstructive pulmonary disease) (HCC) 08/11/2017   Cellulitis of right lower extremity 08/11/2017   Fall at home, initial encounter 08/11/2017   Thrombocytopenia (HCC) 08/11/2017   Weakness generalized  08/11/2017   Hypokalemia 08/11/2017   Essential hypertension 08/11/2017   Past Medical History:  Diagnosis Date   Abdominal aortic aneurysm    no AAA on [place] in 2020   CAD (coronary artery disease)    Stent to RCA 2021   Chronic obstructive pulmonary disease    HLD (hyperlipidemia)    HTN (hypertension)    Peripheral artery disease    s/p R fem pop // s/p R transmet amp    Family History  Adopted: Yes    Past Surgical History:  Procedure Laterality Date   ABDOMINAL AORTOGRAM N/A 08/17/2017   Procedure: ABDOMINAL AORTOGRAM;  Surgeon: Maeola Harman, MD;  Location: [facility] INVASIVE CV LAB;  Service: Cardiovascular;  Laterality: N/A;   ABDOMINAL AORTOGRAM W/LOWER EXTREMITY Right 07/13/2021   Procedure: ABDOMINAL AORTOGRAM W/LOWER EXTREMITY;  Surgeon: Maeola Harman, MD;  Location: [facility] INVASIVE CV LAB;  Service: Cardiovascular;  Laterality: Right;   AMPUTATION Right 08/21/2017   Procedure: TRANSMETATARSAL AMPUTATION RIGHT FOOT;  Surgeon: Nadara Mustard, MD;  Location: [facility] OR;  Service: Orthopedics;  Laterality: Right;   AMPUTATION TOE Right 08/18/2017   Procedure: AMPUTATION RIGHT SECOND AND THIRD TOES;  Surgeon: Maeola Harman, MD;  Location: [facility] OR;  Service: Vascular;  Laterality: Right;   CORONARY BALLOON ANGIOPLASTY N/A 12/03/2019   Procedure: CORONARY BALLOON ANGIOPLASTY;  Surgeon: Corky Crafts, MD;  Location: [facility] INVASIVE CV LAB;  Service: Cardiovascular;  Laterality: N/A;   CORONARY STENT INTERVENTION N/A 12/03/2019   Procedure: CORONARY STENT INTERVENTION;  Surgeon: Corky Crafts, MD;  Location: [facility] INVASIVE CV LAB;  Service: Cardiovascular;  Laterality: N/A;   ENDARTERECTOMY FEMORAL Right 08/18/2017   Procedure: ENDARTERECTOMY RIGHT SUPERFICIAL Alfredia Ferguson;  Surgeon: Maeola Harman, MD;  Location: [facility] OR;  Service: Vascular;  Laterality: Right;   FEMORAL-POPLITEAL BYPASS GRAFT Right 08/18/2017   Procedure: BYPASS GRAFT RIGHT COMMON  FEMORAL TO ABOVE KNEE POPLITEAL ARTERY USING RIGHT REVERSED GREAT SAPHENOUS VEIN;  Surgeon: Maeola Harman, MD;  Location: [facility] OR;  Service: Vascular;  Laterality: Right;   LEFT HEART CATH AND CORONARY ANGIOGRAPHY N/A 12/03/2019   Procedure: LEFT HEART CATH AND CORONARY ANGIOGRAPHY;  Surgeon: Corky Crafts, MD;  Location: [facility] INVASIVE CV LAB;  Service: Cardiovascular;  Laterality: N/A;   LEFT HEART CATH AND CORONARY ANGIOGRAPHY N/A 04/14/2021   Procedure: LEFT HEART CATH AND CORONARY ANGIOGRAPHY;  Surgeon: Corky Crafts, MD;  Location: [facility] INVASIVE CV LAB;  Service: Cardiovascular;  Laterality: N/A;   LOWER EXTREMITY ANGIOGRAPHY Right 08/17/2017   Procedure: Lower Extremity Angiography;  Surgeon: Maeola Harman, MD;  Location: [facility] INVASIVE CV LAB;  Service:  Cardiovascular;  Laterality: Right;   PERIPHERAL VASCULAR BALLOON ANGIOPLASTY Right 08/17/2017   Procedure: PERIPHERAL VASCULAR BALLOON ANGIOPLASTY;  Surgeon: Maeola Harman, MD;  Location: [facility] INVASIVE CV LAB;  Service: Cardiovascular;  Laterality: Right;  SFA UNABLE TO CROSS   VEIN HARVEST Right 08/18/2017   Procedure: VEIN HARVEST RIGHT GREAT SAPHENOUS;  Surgeon: Maeola Harman, MD;  Location: [facility] OR;  Service: Vascular;  Laterality: Right;   WOUND DEBRIDEMENT Right 08/18/2017   Procedure: DEBRIDEMENT WOUND RIGHT FOOT;  Surgeon: Maeola Harman, MD;  Location: [facility] OR;  Service: Vascular;  Laterality: Right;   Social History   Occupational History   Not on file  Tobacco Use   Smoking status: Former   Smokeless tobacco: Never  Vaping Use   Vaping Use: Former  Substance and Sexual Activity   Alcohol use: Not Currently   Drug use: No   Sexual activity: Not on file

## 2021-10-22 ENCOUNTER — Ambulatory Visit: Payer: Medicare HMO | Admitting: Orthopedic Surgery

## 2021-10-22 DIAGNOSIS — Z89431 Acquired absence of right foot: Secondary | ICD-10-CM

## 2021-10-27 ENCOUNTER — Telehealth: Payer: Self-pay | Admitting: Orthopedic Surgery

## 2021-10-27 NOTE — Telephone Encounter (Signed)
Pt informed

## 2021-10-27 NOTE — Telephone Encounter (Signed)
I have him scheduled 11/05/21 at 2 pm.

## 2021-10-27 NOTE — Telephone Encounter (Signed)
Patient called in stating he was told by duda to come in on 07/20 at 2:00 but no appt available until 07/27 please advise if patient should wait or be worked in

## 2021-11-02 ENCOUNTER — Other Ambulatory Visit: Payer: Self-pay | Admitting: Cardiovascular Disease

## 2021-11-05 ENCOUNTER — Ambulatory Visit: Payer: Medicare HMO | Admitting: Orthopedic Surgery

## 2021-11-05 DIAGNOSIS — Z89431 Acquired absence of right foot: Secondary | ICD-10-CM | POA: Diagnosis not present

## 2021-11-05 DIAGNOSIS — L97511 Non-pressure chronic ulcer of other part of right foot limited to breakdown of skin: Secondary | ICD-10-CM

## 2021-11-10 ENCOUNTER — Encounter: Payer: Self-pay | Admitting: Orthopedic Surgery

## 2021-11-10 NOTE — Progress Notes (Signed)
Office Visit Note   Patient: Jorge Clements           Date of Birth: September 26, 1945           MRN: [id number] Visit Date: 10/22/2021              Requested by: Benita Stabile, MD [address] [place],  [place] [address] PCP: Benita Stabile, MD  Chief Complaint  Patient presents with   Right Foot - Wound Check    Hx transmet amputation      HPI: Patient is a 76 year old gentleman status post a right transmetatarsal amputation currently undergoing compression wraps for venous and lymphatic insufficiency with pressure offloading donut and silver cell.  Assessment & Plan: Visit Diagnoses:  1. History of transmetatarsal amputation of right foot (HCC)     Plan: We will continue with dressing changes and Ace wrap.  Discussed with the patient that we have had good skin healing but he still has destructive bony changes with wound tunneling that goes down to the midfoot.  Discussed that he would still require a transtibial amputation.  Follow-Up Instructions: Return in about 2 weeks (around 11/05/2021).   Ortho Exam  Patient is alert, oriented, no adenopathy, well-dressed, normal affect, normal respiratory effort. Examination the swelling has decreased in the right lower extremity the skin texture and color has improved the plantar ulcer is healing well however there is still is tunneling down to bone of the midfoot.  Imaging: No results found. No images are attached to the encounter.  Labs: Lab Results  Component Value Date   HGBA1C 6.4 (H) 12/02/2019   ESRSEDRATE 28 (H) 01/18/2021   ESRSEDRATE 40 (H) 12/24/2020   CRP 11.1 (H) 01/18/2021   CRP 1.9 (H) 12/24/2020   REPTSTATUS 12/27/2020 FINAL 12/24/2020   GRAMSTAIN NO WBC SEEN NO ORGANISMS SEEN  12/24/2020   CULT  12/24/2020    FEW STAPHYLOCOCCUS AUREUS WITHIN MIXED ORGANISMS Performed at [facility], [address] [place], [place] [address]    [facility] STAPHYLOCOCCUS AUREUS 12/24/2020     Lab Results  Component  Value Date   ALBUMIN 3.9 12/01/2019   ALBUMIN 3.7 09/26/2017   ALBUMIN 2.3 (L) 08/17/2017    Lab Results  Component Value Date   MG 2.2 08/17/2017   MG 1.8 08/15/2017   No results found for: "VD25OH"  No results found for: "PREALBUMIN"    Latest Ref Rng & Units 08/10/2021    1:36 AM 07/13/2021    8:26 AM 04/15/2021    3:45 AM  CBC EXTENDED  WBC 4.0 - 10.5 K/uL 8.2   6.1   RBC 4.22 - 5.81 MIL/uL 4.49   4.42   Hemoglobin 13.0 - 17.0 g/dL 78.2  42.3  53.6   HCT 39.0 - 52.0 % 40.9  40.0  38.8   Platelets 150 - 400 K/uL 115   96      There is no height or weight on file to calculate BMI.  Orders:  No orders of the defined types were placed in this encounter.  No orders of the defined types were placed in this encounter.    Procedures: No procedures performed  Clinical Data: No additional findings.  ROS:  All other systems negative, except as noted in the HPI. Review of Systems  Objective: Vital Signs: There were no vitals taken for this visit.  Specialty Comments:  No specialty comments available.  PMFS History: Patient Active Problem List   Diagnosis  Date Noted   Chest pain 04/14/2021   CAD (coronary artery disease)    Unstable angina (HCC)    Venous insufficiency (chronic) (peripheral)    Ulcer of right foot limited to breakdown of skin (HCC)    Chest pain, rule out acute myocardial infarction 12/23/2020   PAD (peripheral artery disease) (HCC) 12/23/2020   HLD (hyperlipidemia) 12/23/2020   ACS (acute coronary syndrome) (HCC) 12/01/2019   Non-ST elevation (NSTEMI) myocardial infarction (HCC) 12/01/2019   NSTEMI (non-ST elevated myocardial infarction) (HCC) 12/01/2019   History of transmetatarsal amputation of right foot (HCC)    Subacute osteomyelitis, right ankle and foot (HCC)    Infected blister of foot 08/13/2017   Pressure injury of skin 08/12/2017   Weakness 08/11/2017   COPD (chronic obstructive pulmonary disease) (HCC) 08/11/2017   Cellulitis  of right lower extremity 08/11/2017   Fall at home, initial encounter 08/11/2017   Thrombocytopenia (HCC) 08/11/2017   Weakness generalized 08/11/2017   Hypokalemia 08/11/2017   Essential hypertension 08/11/2017   Past Medical History:  Diagnosis Date   Abdominal aortic aneurysm    no AAA on [place] in 2020   CAD (coronary artery disease)    Stent to RCA 2021   Chronic obstructive pulmonary disease    HLD (hyperlipidemia)    HTN (hypertension)    Peripheral artery disease    s/p R fem pop // s/p R transmet amp    Family History  Adopted: Yes    Past Surgical History:  Procedure Laterality Date   ABDOMINAL AORTOGRAM N/A 08/17/2017   Procedure: ABDOMINAL AORTOGRAM;  Surgeon: Maeola Harman, MD;  Location: [facility] INVASIVE CV LAB;  Service: Cardiovascular;  Laterality: N/A;   ABDOMINAL AORTOGRAM W/LOWER EXTREMITY Right 07/13/2021   Procedure: ABDOMINAL AORTOGRAM W/LOWER EXTREMITY;  Surgeon: Maeola Harman, MD;  Location: [facility] INVASIVE CV LAB;  Service: Cardiovascular;  Laterality: Right;   AMPUTATION Right 08/21/2017   Procedure: TRANSMETATARSAL AMPUTATION RIGHT FOOT;  Surgeon: Nadara Mustard, MD;  Location: [facility] OR;  Service: Orthopedics;  Laterality: Right;   AMPUTATION TOE Right 08/18/2017   Procedure: AMPUTATION RIGHT SECOND AND THIRD TOES;  Surgeon: Maeola Harman, MD;  Location: [facility] OR;  Service: Vascular;  Laterality: Right;   CORONARY BALLOON ANGIOPLASTY N/A 12/03/2019   Procedure: CORONARY BALLOON ANGIOPLASTY;  Surgeon: Corky Crafts, MD;  Location: [facility] INVASIVE CV LAB;  Service: Cardiovascular;  Laterality: N/A;   CORONARY STENT INTERVENTION N/A 12/03/2019   Procedure: CORONARY STENT INTERVENTION;  Surgeon: Corky Crafts, MD;  Location: [facility] INVASIVE CV LAB;  Service: Cardiovascular;  Laterality: N/A;   ENDARTERECTOMY FEMORAL Right 08/18/2017   Procedure: ENDARTERECTOMY RIGHT SUPERFICIAL Alfredia Ferguson;  Surgeon: Maeola Harman, MD;   Location: [facility] OR;  Service: Vascular;  Laterality: Right;   FEMORAL-POPLITEAL BYPASS GRAFT Right 08/18/2017   Procedure: BYPASS GRAFT RIGHT COMMON FEMORAL TO ABOVE KNEE POPLITEAL ARTERY USING RIGHT REVERSED GREAT SAPHENOUS VEIN;  Surgeon: Maeola Harman, MD;  Location: [facility] OR;  Service: Vascular;  Laterality: Right;   LEFT HEART CATH AND CORONARY ANGIOGRAPHY N/A 12/03/2019   Procedure: LEFT HEART CATH AND CORONARY ANGIOGRAPHY;  Surgeon: Corky Crafts, MD;  Location: [facility] INVASIVE CV LAB;  Service: Cardiovascular;  Laterality: N/A;   LEFT HEART CATH AND CORONARY ANGIOGRAPHY N/A 04/14/2021   Procedure: LEFT HEART CATH AND CORONARY ANGIOGRAPHY;  Surgeon: Corky Crafts, MD;  Location: [facility] INVASIVE CV LAB;  Service: Cardiovascular;  Laterality: N/A;   LOWER EXTREMITY ANGIOGRAPHY Right 08/17/2017   Procedure: Lower  Extremity Angiography;  Surgeon: Maeola Harman, MD;  Location: [facility] INVASIVE CV LAB;  Service: Cardiovascular;  Laterality: Right;   PERIPHERAL VASCULAR BALLOON ANGIOPLASTY Right 08/17/2017   Procedure: PERIPHERAL VASCULAR BALLOON ANGIOPLASTY;  Surgeon: Maeola Harman, MD;  Location: [facility] INVASIVE CV LAB;  Service: Cardiovascular;  Laterality: Right;  SFA UNABLE TO CROSS   VEIN HARVEST Right 08/18/2017   Procedure: VEIN HARVEST RIGHT GREAT SAPHENOUS;  Surgeon: Maeola Harman, MD;  Location: [facility] OR;  Service: Vascular;  Laterality: Right;   WOUND DEBRIDEMENT Right 08/18/2017   Procedure: DEBRIDEMENT WOUND RIGHT FOOT;  Surgeon: Maeola Harman, MD;  Location: [facility] OR;  Service: Vascular;  Laterality: Right;   Social History   Occupational History   Not on file  Tobacco Use   Smoking status: Former   Smokeless tobacco: Never  Vaping Use   Vaping Use: Former  Substance and Sexual Activity   Alcohol use: Not Currently   Drug use: No   Sexual activity: Not on file

## 2021-11-17 ENCOUNTER — Encounter: Payer: Self-pay | Admitting: Orthopedic Surgery

## 2021-11-17 NOTE — Progress Notes (Signed)
Office Visit Note   Patient: Jorge Clements           Date of Birth: Aug 27, 1945           MRN: [id number] Visit Date: 11/05/2021              Requested by: Celene Squibb, MD [address] [place],  [place] [address] PCP: Celene Squibb, MD  Chief Complaint  Patient presents with   Right Foot - Follow-up    Hx transmet amputation f/u plantar ulcer       HPI: Patient is a 76 year old gentleman who is status post right transmetatarsal amputation who presents in follow-up for [facility] grade 1 ulcer plantar aspect.  Patient also complains of a black left great toenail.  Assessment & Plan: Visit Diagnoses:  1. History of transmetatarsal amputation of right foot ([place])   2. Non-pressure chronic ulcer of other part of right foot limited to breakdown of skin ([place])     Plan: Ulcer was debrided on the right foot patient has a stable subungual hematoma on the left great toe recommended compression stockings for the venous insufficiency.  Patient will complete his course of doxycycline.  Follow-Up Instructions: Return in about 4 weeks (around 12/03/2021).   Ortho Exam  Patient is alert, oriented, no adenopathy, well-dressed, normal affect, normal respiratory effort. Examination patient has an ulcer beneath the right transmetatarsal amputation.  Measures 5 mm in diameter 1 mm deep there is no cellulitis no drainage there is healthy granulation tissue.  Left great toe there is a subungual hematoma with stable onychomycosis no signs of infection.  Patient has brawny edema and venous stasis changes in the right leg with no open ulcers.  Imaging: No results found. No images are attached to the encounter.  Labs: Lab Results  Component Value Date   HGBA1C 6.4 (H) 12/02/2019   ESRSEDRATE 28 (H) 01/18/2021   ESRSEDRATE 40 (H) 12/24/2020   CRP 11.1 (H) 01/18/2021   CRP 1.9 (H) 12/24/2020   REPTSTATUS 12/27/2020 FINAL 12/24/2020   GRAMSTAIN NO WBC SEEN NO ORGANISMS SEEN  12/24/2020   CULT   12/24/2020    FEW STAPHYLOCOCCUS AUREUS WITHIN MIXED ORGANISMS Performed at [facility], [place] [address], [place], [place] [address]    [facility] STAPHYLOCOCCUS AUREUS 12/24/2020     Lab Results  Component Value Date   ALBUMIN 3.9 12/01/2019   ALBUMIN 3.7 09/26/2017   ALBUMIN 2.3 (L) 08/17/2017    Lab Results  Component Value Date   MG 2.2 08/17/2017   MG 1.8 08/15/2017   No results found for: "VD25OH"  No results found for: "PREALBUMIN"    Latest Ref Rng & Units 08/10/2021    1:36 AM 07/13/2021    8:26 AM 04/15/2021    3:45 AM  CBC EXTENDED  WBC 4.0 - 10.5 K/uL 8.2   6.1   RBC 4.22 - 5.81 MIL/uL 4.49   4.42   Hemoglobin 13.0 - 17.0 g/dL 13.5  13.6  12.3   HCT 39.0 - 52.0 % 40.9  40.0  38.8   Platelets 150 - 400 K/uL 115   96      There is no height or weight on file to calculate BMI.  Orders:  No orders of the defined types were placed in this encounter.  No orders of the defined types were placed in this encounter.    Procedures: No procedures performed  Clinical Data: No additional findings.  ROS:  All other  systems negative, except as noted in the HPI. Review of Systems  Objective: Vital Signs: There were no vitals taken for this visit.  Specialty Comments:  No specialty comments available.  PMFS History: Patient Active Problem List   Diagnosis Date Noted   Chest pain 04/14/2021   CAD (coronary artery disease)    Unstable angina (HCC)    Venous insufficiency (chronic) (peripheral)    Ulcer of right foot limited to breakdown of skin (HCC)    Chest pain, rule out acute myocardial infarction 12/23/2020   PAD (peripheral artery disease) (HCC) 12/23/2020   HLD (hyperlipidemia) 12/23/2020   ACS (acute coronary syndrome) (HCC) 12/01/2019   Non-ST elevation (NSTEMI) myocardial infarction (HCC) 12/01/2019   NSTEMI (non-ST elevated myocardial infarction) (HCC) 12/01/2019   History of transmetatarsal amputation of right foot (HCC)    Subacute  osteomyelitis, right ankle and foot (HCC)    Infected blister of foot 08/13/2017   Pressure injury of skin 08/12/2017   Weakness 08/11/2017   COPD (chronic obstructive pulmonary disease) (HCC) 08/11/2017   Cellulitis of right lower extremity 08/11/2017   Fall at home, initial encounter 08/11/2017   Thrombocytopenia (HCC) 08/11/2017   Weakness generalized 08/11/2017   Hypokalemia 08/11/2017   Essential hypertension 08/11/2017   Past Medical History:  Diagnosis Date   Abdominal aortic aneurysm    no AAA on [place] in 2020   CAD (coronary artery disease)    Stent to RCA 2021   Chronic obstructive pulmonary disease    HLD (hyperlipidemia)    HTN (hypertension)    Peripheral artery disease    s/p R fem pop // s/p R transmet amp    Family History  Adopted: Yes    Past Surgical History:  Procedure Laterality Date   ABDOMINAL AORTOGRAM N/A 08/17/2017   Procedure: ABDOMINAL AORTOGRAM;  Surgeon: Maeola Harman, MD;  Location: [facility] INVASIVE CV LAB;  Service: Cardiovascular;  Laterality: N/A;   ABDOMINAL AORTOGRAM W/LOWER EXTREMITY Right 07/13/2021   Procedure: ABDOMINAL AORTOGRAM W/LOWER EXTREMITY;  Surgeon: Maeola Harman, MD;  Location: [facility] INVASIVE CV LAB;  Service: Cardiovascular;  Laterality: Right;   AMPUTATION Right 08/21/2017   Procedure: TRANSMETATARSAL AMPUTATION RIGHT FOOT;  Surgeon: Nadara Mustard, MD;  Location: [facility] OR;  Service: Orthopedics;  Laterality: Right;   AMPUTATION TOE Right 08/18/2017   Procedure: AMPUTATION RIGHT SECOND AND THIRD TOES;  Surgeon: Maeola Harman, MD;  Location: [facility] OR;  Service: Vascular;  Laterality: Right;   CORONARY BALLOON ANGIOPLASTY N/A 12/03/2019   Procedure: CORONARY BALLOON ANGIOPLASTY;  Surgeon: Corky Crafts, MD;  Location: [facility] INVASIVE CV LAB;  Service: Cardiovascular;  Laterality: N/A;   CORONARY STENT INTERVENTION N/A 12/03/2019   Procedure: CORONARY STENT INTERVENTION;  Surgeon: Corky Crafts, MD;   Location: [facility] INVASIVE CV LAB;  Service: Cardiovascular;  Laterality: N/A;   ENDARTERECTOMY FEMORAL Right 08/18/2017   Procedure: ENDARTERECTOMY RIGHT SUPERFICIAL Alfredia Ferguson;  Surgeon: Maeola Harman, MD;  Location: [facility] OR;  Service: Vascular;  Laterality: Right;   FEMORAL-POPLITEAL BYPASS GRAFT Right 08/18/2017   Procedure: BYPASS GRAFT RIGHT COMMON FEMORAL TO ABOVE KNEE POPLITEAL ARTERY USING RIGHT REVERSED GREAT SAPHENOUS VEIN;  Surgeon: Maeola Harman, MD;  Location: [facility] OR;  Service: Vascular;  Laterality: Right;   LEFT HEART CATH AND CORONARY ANGIOGRAPHY N/A 12/03/2019   Procedure: LEFT HEART CATH AND CORONARY ANGIOGRAPHY;  Surgeon: Corky Crafts, MD;  Location: [facility] INVASIVE CV LAB;  Service: Cardiovascular;  Laterality: N/A;   LEFT HEART CATH AND CORONARY  ANGIOGRAPHY N/A 04/14/2021   Procedure: LEFT HEART CATH AND CORONARY ANGIOGRAPHY;  Surgeon: Corky Crafts, MD;  Location: [facility] INVASIVE CV LAB;  Service: Cardiovascular;  Laterality: N/A;   LOWER EXTREMITY ANGIOGRAPHY Right 08/17/2017   Procedure: Lower Extremity Angiography;  Surgeon: Maeola Harman, MD;  Location: [facility] INVASIVE CV LAB;  Service: Cardiovascular;  Laterality: Right;   PERIPHERAL VASCULAR BALLOON ANGIOPLASTY Right 08/17/2017   Procedure: PERIPHERAL VASCULAR BALLOON ANGIOPLASTY;  Surgeon: Maeola Harman, MD;  Location: [facility] INVASIVE CV LAB;  Service: Cardiovascular;  Laterality: Right;  SFA UNABLE TO CROSS   VEIN HARVEST Right 08/18/2017   Procedure: VEIN HARVEST RIGHT GREAT SAPHENOUS;  Surgeon: Maeola Harman, MD;  Location: [facility] OR;  Service: Vascular;  Laterality: Right;   WOUND DEBRIDEMENT Right 08/18/2017   Procedure: DEBRIDEMENT WOUND RIGHT FOOT;  Surgeon: Maeola Harman, MD;  Location: [facility] OR;  Service: Vascular;  Laterality: Right;   Social History   Occupational History   Not on file  Tobacco Use   Smoking status: Former   Smokeless tobacco: Never   Vaping Use   Vaping Use: Former  Substance and Sexual Activity   Alcohol use: Not Currently   Drug use: No   Sexual activity: Not on file

## 2021-12-01 NOTE — Progress Notes (Signed)
CARDIOLOGY CONSULT NOTE       Patient ID: Jorge Clements MRN: [id number] DOB/AGE: [birth year] 76 y.o.  Admit date: (Not on file) Referring Physician: [place] Crane Primary Physician: Celene Squibb, MD Primary Cardiologist: Curtis  Reason for Consultation: CAD    HPI:  76 y.o. patient Dr Marisue Ivan D/c from [facility] 12/04/19 after having SEMI Troponin peak 188 TTE 12/02/19 EF 65-70% and trivial MR.  Cath by Dr Irish Lack 12/03/19 showed tight bifurcation lesion in mid to distal RCA / RV branch with left to right collaterals Had DES with thrombectomy with good result No significant left sided disease.   History of HTN, HLD, COPD and PVD. Post right fem-pop bypass and right transmet amputation He is followed by Dr Donzetta Matters [facility]  Duplex 08/31/21 suggested tight inflow dx. He had angiography 07/13/21 with no significant pullback gradient Melvenia Needles after amputation for venous.lymphatic ulcers and fungal infections feet  No angina Brillinta caused dyspnea   ROS All other systems reviewed and negative except as noted above  Past Medical History:  Diagnosis Date  . Abdominal aortic aneurysm    no AAA on [place] in 2020  . CAD (coronary artery disease)    Stent to RCA 2021  . Chronic obstructive pulmonary disease   . HLD (hyperlipidemia)   . HTN (hypertension)   . Peripheral artery disease    s/p R fem pop // s/p R transmet amp    Family History  Adopted: Yes    Social History   Socioeconomic History  . Marital status: Divorced    Spouse name: Not on file  . Number of children: Not on file  . Years of education: Not on file  . Highest education level: Not on file  Occupational History  . Not on file  Tobacco Use  . Smoking status: Former  . Smokeless tobacco: Never  Vaping Use  . Vaping Use: Former  Substance and Sexual Activity  . Alcohol use: Not Currently  . Drug use: No  . Sexual activity: Not on file  Other Topics Concern  . Not on file  Social History Narrative  . Not on  file   Social Determinants of Health   Financial Resource Strain: Not on file  Food Insecurity: Not on file  Transportation Needs: Not on file  Physical Activity: Not on file  Stress: Not on file  Social Connections: Not on file  Intimate Partner Violence: Not on file    Past Surgical History:  Procedure Laterality Date  . ABDOMINAL AORTOGRAM N/A 08/17/2017   Procedure: ABDOMINAL AORTOGRAM;  Surgeon: Waynetta Sandy, MD;  Location: [place] [facility];  Service: Cardiovascular;  Laterality: N/A;  . ABDOMINAL AORTOGRAM W/LOWER EXTREMITY Right 07/13/2021   Procedure: ABDOMINAL AORTOGRAM W/LOWER EXTREMITY;  Surgeon: Waynetta Sandy, MD;  Location: [place] [facility];  Service: Cardiovascular;  Laterality: Right;  . AMPUTATION Right 08/21/2017   Procedure: TRANSMETATARSAL AMPUTATION RIGHT FOOT;  Surgeon: Newt Minion, MD;  Location: [place];  Service: Orthopedics;  Laterality: Right;  . AMPUTATION TOE Right 08/18/2017   Procedure: AMPUTATION RIGHT SECOND AND THIRD TOES;  Surgeon: Waynetta Sandy, MD;  Location: [place];  Service: Vascular;  Laterality: Right;  . CORONARY BALLOON ANGIOPLASTY N/A 12/03/2019   Procedure: CORONARY BALLOON ANGIOPLASTY;  Surgeon: Jettie Booze, MD;  Location: [place] [facility];  Service: Cardiovascular;  Laterality: N/A;  . CORONARY STENT INTERVENTION N/A 12/03/2019   Procedure: CORONARY STENT INTERVENTION;  Surgeon: Jettie Booze,  MD;  Location: [place] [facility];  Service: Cardiovascular;  Laterality: N/A;  . ENDARTERECTOMY FEMORAL Right 08/18/2017   Procedure: ENDARTERECTOMY RIGHT SUPERFICIAL Jerene Canny;  Surgeon: Waynetta Sandy, MD;  Location: [place];  Service: Vascular;  Laterality: Right;  . FEMORAL-POPLITEAL BYPASS GRAFT Right 08/18/2017   Procedure: BYPASS GRAFT RIGHT COMMON FEMORAL TO ABOVE KNEE POPLITEAL ARTERY USING RIGHT REVERSED GREAT SAPHENOUS VEIN;  Surgeon: Waynetta Sandy, MD;  Location: [place];  Service: Vascular;  Laterality: Right;  . LEFT HEART CATH AND CORONARY ANGIOGRAPHY N/A 12/03/2019   Procedure: LEFT HEART CATH AND CORONARY ANGIOGRAPHY;  Surgeon: Jettie Booze, MD;  Location: [place] [facility];  Service: Cardiovascular;  Laterality: N/A;  . LEFT HEART CATH AND CORONARY ANGIOGRAPHY N/A 04/14/2021   Procedure: LEFT HEART CATH AND CORONARY ANGIOGRAPHY;  Surgeon: Jettie Booze, MD;  Location: [place] [facility];  Service: Cardiovascular;  Laterality: N/A;  . LOWER EXTREMITY ANGIOGRAPHY Right 08/17/2017   Procedure: Lower Extremity Angiography;  Surgeon: Waynetta Sandy, MD;  Location: [place] [facility];  Service: Cardiovascular;  Laterality: Right;  . PERIPHERAL VASCULAR BALLOON ANGIOPLASTY Right 08/17/2017   Procedure: PERIPHERAL VASCULAR BALLOON ANGIOPLASTY;  Surgeon: Waynetta Sandy, MD;  Location: [place] [facility];  Service: Cardiovascular;  Laterality: Right;  SFA UNABLE TO CROSS  . VEIN HARVEST Right 08/18/2017   Procedure: VEIN HARVEST RIGHT GREAT SAPHENOUS;  Surgeon: Waynetta Sandy, MD;  Location: [place];  Service: Vascular;  Laterality: Right;  . WOUND DEBRIDEMENT Right 08/18/2017   Procedure: DEBRIDEMENT WOUND RIGHT FOOT;  Surgeon: Waynetta Sandy, MD;  Location: [place];  Service: Vascular;  Laterality: Right;      Current Outpatient Medications:  .  BREZTRI AEROSPHERE 160-9-4.8 MCG/ACT AERO, Inhale 2 puffs into the lungs 2 (two) times daily., Disp: , Rfl:  .  clopidogrel (PLAVIX) 75 MG tablet, Take 1 tablet (75 mg total) by mouth daily., Disp: 30 tablet, Rfl: 1 .  doxycycline (VIBRA-TABS) 100 MG tablet, Take 1 tablet (100 mg total) by mouth 2 (two) times daily., Disp: 60 tablet, Rfl: 0 .  isosorbide mononitrate (IMDUR) 30 MG 24 hr tablet, Take 1 tablet (30 mg total) by mouth daily., Disp: 30 tablet, Rfl: 3 .  losartan (COZAAR) 50 MG tablet, Take 1 tablet by mouth once daily (Patient taking differently: Take 50 mg by  mouth daily.), Disp: 90 tablet, Rfl: 3 .  metoprolol tartrate (LOPRESSOR) 25 MG tablet, Take 1 tablet by mouth twice daily (Patient taking differently: Take 25 mg by mouth 2 (two) times daily.), Disp: 180 tablet, Rfl: 1 .  Multiple Vitamins-Minerals (CENTRUM SILVER 50+MEN PO), Take 1 tablet by mouth daily., Disp: , Rfl:  .  niacin 500 MG tablet, Take 500 mg by mouth daily., Disp: , Rfl:  .  nitroGLYCERIN (NITROSTAT) 0.4 MG SL tablet, Place 0.4 mg under the tongue every 5 (five) minutes as needed for chest pain., Disp: , Rfl:  .  rosuvastatin (CRESTOR) 40 MG tablet, Take 1 tablet by mouth once daily, Disp: 90 tablet, Rfl: 1 .  sulfamethoxazole-trimethoprim (BACTRIM) 400-80 MG tablet, Take 1 tablet by mouth 2 (two) times daily., Disp: 20 tablet, Rfl: 0 .  Vitamin D-Vitamin K (VITAMIN K2-VITAMIN D3 PO), Take 1 tablet by mouth daily., Disp: , Rfl:  .  vitamin E 180 MG (400 UNITS) capsule, Take 400 Units by mouth daily., Disp: , Rfl:     Physical Exam: There were no vitals taken for this visit.  Affect appropriate Healthy:  appears stated age 70: normal Neck supple with no adenopathy JVP normal no bruits no thyromegaly Lungs clear with no wheezing and good diaphragmatic motion Heart:  S1/S2 no murmur, no rub, gallop or click PMI normal Abdomen: benighn, BS positve, no tenderness, no AAA no bruit.  No HSM or HJR Post right fem pop bypass and right trans met amputation with chronic right foot plantar ulcer  No edema Neuro non-focal    Labs:   Lab Results  Component Value Date   WBC 8.2 08/10/2021   HGB 13.5 08/10/2021   HCT 40.9 08/10/2021   MCV 91.1 08/10/2021   PLT 115 (L) 08/10/2021   No results for input(s): "NA", "K", "CL", "CO2", "BUN", "CREATININE", "CALCIUM", "PROT", "BILITOT", "ALKPHOS", "ALT", "AST", "GLUCOSE" in the last 168 hours.  Invalid input(s): "LABALBU" Lab Results  Component Value Date   TROPONINI <0.03 08/11/2017    Lab Results  Component Value Date    CHOL 90 12/24/2020   CHOL 103 12/02/2019   Lab Results  Component Value Date   HDL 29 (L) 12/24/2020   HDL 38 (L) 12/02/2019   Lab Results  Component Value Date   LDLCALC 34 12/24/2020   LDLCALC 39 12/02/2019   Lab Results  Component Value Date   TRIG 135 12/24/2020   TRIG 130 12/02/2019   Lab Results  Component Value Date   CHOLHDL 3.1 12/24/2020   CHOLHDL 2.7 12/02/2019   No results found for: "LDLDIRECT"    Radiology: No results found.  EKG: SR LAFB 12/06/19    ASSESSMENT AND PLAN:   1. CAD: SEMI with classic angina 12/03/19 DES to high bifurcation mid RCA at PDA/PLB No significant left sided disease preserved EF with left to right collaterals. Continue beta blocker statin ASA and plavix   2. PVD: f/u [facility] Dr Donzetta Matters post right fem pop and right transmet amputation with moderate PVD based on ABI's done  08/31/21 0.81 on right and 0.68 on left   3. HTN:   Well controlled.  Continue current medications and low sodium Dash type diet.    4. HLD:  On statin LDL 28 labs by primary 12/27/19   5. DM:  On Glucophage A1c 6.4 Augst 2021 f/u primary   F/U Dr Donzetta Matters [facility] for PVD F/U cardiology in a year   Signed: Jenkins Rouge 12/01/2021, 3:51 PM

## 2021-12-02 ENCOUNTER — Ambulatory Visit (HOSPITAL_COMMUNITY)
Admission: RE | Admit: 2021-12-02 | Discharge: 2021-12-02 | Disposition: A | Discharge status: Home / Self Care | Payer: Medicare HMO | Source: Ambulatory Visit | Attending: Vascular Surgery | Admitting: Vascular Surgery

## 2021-12-02 ENCOUNTER — Ambulatory Visit: Payer: Medicare HMO | Admitting: Physician Assistant

## 2021-12-02 ENCOUNTER — Ambulatory Visit (INDEPENDENT_AMBULATORY_CARE_PROVIDER_SITE_OTHER)
Admission: RE | Admit: 2021-12-02 | Discharge: 2021-12-02 | Disposition: A | Discharge status: Home / Self Care | Payer: Medicare HMO | Source: Ambulatory Visit | Attending: Vascular Surgery | Admitting: Vascular Surgery

## 2021-12-02 VITALS — BP 152/68 | HR 68 | Temp 97.6°F | Resp 20 | Ht 72.0 in | Wt 290.0 lb

## 2021-12-02 DIAGNOSIS — F419 Anxiety disorder, unspecified: Secondary | ICD-10-CM | POA: Insufficient documentation

## 2021-12-02 DIAGNOSIS — Z9889 Other specified postprocedural states: Secondary | ICD-10-CM

## 2021-12-02 DIAGNOSIS — M79604 Pain in right leg: Secondary | ICD-10-CM | POA: Insufficient documentation

## 2021-12-02 DIAGNOSIS — I739 Peripheral vascular disease, unspecified: Secondary | ICD-10-CM

## 2021-12-02 DIAGNOSIS — F1010 Alcohol abuse, uncomplicated: Secondary | ICD-10-CM | POA: Insufficient documentation

## 2021-12-02 NOTE — Progress Notes (Signed)
Established Previous Bypass   History of Present Illness   Jorge Clements is a 76 y.o. (Dec 11, 1945) male who presents with follow up for PAD.  He underwent right femoral to above-knee bypass with vein on 08/18/2017 by Dr. Randie Heinz.  He subsequently underwent a TMA on the right by Dr. Lajoyce Corners on 08/21/2017.  At the patient's last visit on 06/30/2021, he had a drop in his ABIs on the right side and 75 to 99% inflow stenosis of the RLE bypass.  He then underwent RLE angiography on 07/13/2021 to investigate this stenosis, however the procedure did not demonstrate any treatable stenosis.  He also had strong signals in the right foot at that time.  He continues to visit Dr. Lajoyce Corners for treatment of his chronic right foot ulcers and skin breakdown.  Most recently his right foot ulcer was debrided and stable. The patient uses a felt relieving donut with silver cell and Dynaflex.  At follow-up today the patient denies any right leg pain that is out of his "baseline".  He denies any new ulcers of either leg.  He denies any rest pain or claudication.  Current Outpatient Medications  Medication Sig Dispense Refill   BREZTRI AEROSPHERE 160-9-4.8 MCG/ACT AERO Inhale 2 puffs into the lungs 2 (two) times daily.     clopidogrel (PLAVIX) 75 MG tablet Take 1 tablet (75 mg total) by mouth daily. 30 tablet 1   doxycycline (VIBRA-TABS) 100 MG tablet Take 1 tablet (100 mg total) by mouth 2 (two) times daily. 60 tablet 0   isosorbide mononitrate (IMDUR) 30 MG 24 hr tablet Take 1 tablet (30 mg total) by mouth daily. 30 tablet 3   losartan (COZAAR) 50 MG tablet Take 1 tablet by mouth once daily (Patient taking differently: Take 50 mg by mouth daily.) 90 tablet 3   metoprolol tartrate (LOPRESSOR) 25 MG tablet Take 1 tablet by mouth twice daily (Patient taking differently: Take 25 mg by mouth 2 (two) times daily.) 180 tablet 1   Multiple Vitamins-Minerals (CENTRUM SILVER 50+MEN PO) Take 1 tablet by mouth daily.     niacin 500 MG tablet  Take 500 mg by mouth daily.     nitroGLYCERIN (NITROSTAT) 0.4 MG SL tablet Place 0.4 mg under the tongue every 5 (five) minutes as needed for chest pain.     rosuvastatin (CRESTOR) 40 MG tablet Take 1 tablet by mouth once daily 90 tablet 1   sulfamethoxazole-trimethoprim (BACTRIM) 400-80 MG tablet Take 1 tablet by mouth 2 (two) times daily. 20 tablet 0   Vitamin D-Vitamin K (VITAMIN K2-VITAMIN D3 PO) Take 1 tablet by mouth daily.     vitamin E 180 MG (400 UNITS) capsule Take 400 Units by mouth daily.     No current facility-administered medications for this visit.    REVIEW OF SYSTEMS (negative unless checked):   Cardiac:  []  Chest pain or chest pressure? []  Shortness of breath upon activity? []  Shortness of breath when lying flat? []  Irregular heart rhythm?  Vascular:  []  Pain in calf, thigh, or hip brought on by walking? []  Pain in feet at night that wakes you up from your sleep? []  Blood clot in your veins? [x]  Leg swelling?  Pulmonary:  []  Oxygen at home? []  Productive cough? []  Wheezing?  Neurologic:  []  Sudden weakness in arms or legs? []  Sudden numbness in arms or legs? []  Sudden onset of difficult speaking or slurred speech? []  Temporary loss of vision in one eye? []  Problems with dizziness?  Gastrointestinal:  []  Blood in stool? []  Vomited blood?  Genitourinary:  []  Burning when urinating? []  Blood in urine?  Psychiatric:  []  Major depression  Hematologic:  []  Bleeding problems? []  Problems with blood clotting?  Dermatologic:  [x]  Rashes or ulcers?  Constitutional:  []  Fever or chills?  Ear/Nose/Throat:  []  Change in hearing? []  Nose bleeds? []  Sore throat?  Musculoskeletal:  []  Back pain? []  Joint pain? []  Muscle pain?   Physical Examination   Vitals:   12/02/21 1339  BP: (!) 152/68  Pulse: 68  Resp: 20  Temp: 97.6 F (36.4 C)  TempSrc: Temporal  SpO2: 96%  Weight: 290 lb (131.5 kg)  Height: 6' (1.829 m)   Body mass index is  39.33 kg/m.  General:  WDWN in NAD; vital signs documented above Gait: Not observed HENT: WNL, normocephalic Pulmonary: normal non-labored breathing , without Rales, rhonchi,  wheezing Cardiac: regular HR, without murmurs without carotid bruit Abdomen: soft, NT, no masses Skin: without rashes. Healing wounds of the RLE Vascular Exam/Pulses: Brisk DP/Peroneal doppler signals on the right Extremities: without ischemic changes, without Gangrene , without cellulitis; with open wounds;  Musculoskeletal: no muscle wasting or atrophy  Neurologic: A&O X 3;  No focal weakness or paresthesias are detected Psychiatric:  The pt has Normal affect.  Non-Invasive Vascular Imaging ABI (12/02/2021) +-------+-----------+-----------+------------+------------+  ABI/TBIToday's ABIToday's TBIPrevious ABIPrevious TBI  +-------+-----------+-----------+------------+------------+  Right  0.74       amp        0.84        amp           +-------+-----------+-----------+------------+------------+  Left   0.68       0.37       0.68        0.39          +-------+-----------+-----------+------------+------------+  Bypass Duplex (12/02/2021)    Right Graft #1:  +-----------------+--------+--------------+----------+---------------------  ----+                   PSV cm/sStenosis      Waveform  Comments                    +-----------------+--------+--------------+----------+---------------------  ----+  Inflow           347     50-74%        biphasic  disease not well                                     stenosis                visualized                  +-----------------+--------+--------------+----------+---------------------  ----+  Prox Anastomosis 171                   biphasic                              +-----------------+--------+--------------+----------+---------------------  ----+  Proximal Graft   108                   monophasic                             +-----------------+--------+--------------+----------+---------------------  ----+  Mid Graft  65                    monophasic                            +-----------------+--------+--------------+----------+---------------------  ----+  Distal Graft     73                    monophasic                            +-----------------+--------+--------------+----------+---------------------  ----+  Distal           106                   monophasic                            Anastomosis                                                                  +-----------------+--------+--------------+----------+---------------------  ----+  Outflow          129                   monophasic                            +-----------------+--------+--------------+----------+---------------------   Medical Decision Making   Jorge Clements is a 76 y.o. male who is here for PAD follow up in the setting of R fem-pop bypass in 2019  - Duplex study of the right leg shows inflow stenosis of 50 to 74% with increased velocities of 347.  This is decreased from the 75 to 99% stenosis with velocities of 403, seen on 08/31/2021.  At that time duplex in office did not reveal any treatable areas of stenosis.  Per Dr. Randie Heinz, the patient's elevated inflow velocity does not involve the bypass and will likely be the patient's new normal. - Right femoropopliteal bypass remains patent with good velocities. We would consider intervening if the patient develops stenosis involving the bypass itself.  -The patient has brisk Doppler signals in the right DP and peroneal.  He is currently free of rest pain or claudication. - He will continue follow-up with Dr. Lajoyce Corners for the wounds on his right lower extremity. - Continue statin and plavix - Follow-up in 6 months with ABIs and right bypass graft duplex study   Loel Dubonnet PA-C [facility] Office:  [phone]  Clinic MD: Randie Heinz

## 2021-12-03 ENCOUNTER — Ambulatory Visit: Payer: Medicare HMO | Admitting: Cardiovascular Disease

## 2021-12-03 ENCOUNTER — Encounter: Payer: Self-pay | Admitting: Cardiovascular Disease

## 2021-12-03 VITALS — BP 144/60 | HR 71 | Ht 72.0 in | Wt 292.0 lb

## 2021-12-03 DIAGNOSIS — I739 Peripheral vascular disease, unspecified: Secondary | ICD-10-CM | POA: Diagnosis not present

## 2021-12-03 DIAGNOSIS — I2510 Atherosclerotic heart disease of native coronary artery without angina pectoris: Secondary | ICD-10-CM | POA: Diagnosis not present

## 2021-12-03 DIAGNOSIS — E785 Hyperlipidemia, unspecified: Secondary | ICD-10-CM

## 2021-12-03 DIAGNOSIS — I10 Essential (primary) hypertension: Secondary | ICD-10-CM

## 2021-12-03 NOTE — Patient Instructions (Signed)
Medication Instructions:  ?Your physician recommends that you continue on your current medications as directed. Please refer to the Current Medication list given to you today. ? ?*If you need a refill on your cardiac medications before your next appointment, please call your pharmacy* ? ? ?Lab Work: ?NONE  ? ?If you have labs (blood work) drawn today and your tests are completely normal, you will receive your results only by: ?MyChart Message (if you have MyChart) OR ?A paper copy in the mail ?If you have any lab test that is abnormal or we need to change your treatment, we will call you to review the results. ? ? ?Testing/Procedures: ?NONE  ? ? ?Follow-Up: ?At [facility], you and your health needs are our priority.  As part of our continuing mission to provide you with exceptional heart care, we have created designated Provider Care Teams.  These Care Teams include your primary Cardiologist (physician) and Advanced Practice Providers (APPs -  Physician Assistants and Nurse Practitioners) who all work together to provide you with the care you need, when you need it. ? ?We recommend signing up for the patient portal called "MyChart".  Sign up information is provided on this After Visit Summary.  MyChart is used to connect with patients for Virtual Visits (Telemedicine).  Patients are able to view lab/test results, encounter notes, upcoming appointments, etc.  Non-urgent messages can be sent to your provider as well.   ?To learn more about what you can do with MyChart, go to [email or web address].   ? ?Your next appointment:   ?1 year(s) ? ?The format for your next appointment:   ?In Person ? ?Provider:   ?Peter Nishan, MD  ? ? ?Other Instructions ?Thank you for choosing [facility]! ? ? ? ?Important Information About [place] ? ? ? ? ? ? ?

## 2021-12-04 ENCOUNTER — Other Ambulatory Visit: Payer: Self-pay

## 2021-12-04 DIAGNOSIS — I739 Peripheral vascular disease, unspecified: Secondary | ICD-10-CM

## 2021-12-04 DIAGNOSIS — Z9889 Other specified postprocedural states: Secondary | ICD-10-CM

## 2021-12-10 ENCOUNTER — Ambulatory Visit: Payer: Medicare HMO | Admitting: Orthopedic Surgery

## 2021-12-10 DIAGNOSIS — Z89431 Acquired absence of right foot: Secondary | ICD-10-CM | POA: Diagnosis not present

## 2021-12-10 DIAGNOSIS — L97511 Non-pressure chronic ulcer of other part of right foot limited to breakdown of skin: Secondary | ICD-10-CM

## 2021-12-14 ENCOUNTER — Other Ambulatory Visit: Payer: Self-pay | Admitting: Cardiovascular Disease

## 2021-12-14 ENCOUNTER — Other Ambulatory Visit: Payer: Self-pay

## 2021-12-14 MED ORDER — LOSARTAN POTASSIUM 50 MG PO TABS: 50.0000 mg | ORAL_TABLET | Freq: Every day | ORAL | 3 refills | Status: DC

## 2021-12-15 ENCOUNTER — Encounter: Payer: Self-pay | Admitting: Orthopedic Surgery

## 2021-12-15 NOTE — Progress Notes (Signed)
Office Visit Note   Patient: Jorge Clements           Date of Birth: Oct 10, 1945           MRN: [id number] Visit Date: 12/10/2021              Requested by: Benita Stabile, MD [address] [place],  [place] [address] PCP: Benita Stabile, MD  Chief Complaint  Patient presents with   Right Foot - Follow-up    Hx transmet ulcer 2019 F/u ulcer       HPI: Patient is a 76 year old gentleman who is status post a right foot transmetatarsal amputation in 2019 he is currently wearing compression socks.  Patient states the ulcer has scabbed over.  Patient is status post right femoral to popliteal bypass.  Assessment & Plan: Visit Diagnoses:  1. History of transmetatarsal amputation of right foot (HCC)   2. Non-pressure chronic ulcer of other part of right foot limited to breakdown of skin (HCC)     Plan: The dermatitis has resolved the ulcer is healed on the right foot.  The venous stasis swelling is resolving.  We will continue with compression and reevaluate in 4 weeks.  Follow-Up Instructions: Return in about 4 weeks (around 01/07/2022).   Ortho Exam  Patient is alert, oriented, no adenopathy, well-dressed, normal affect, normal respiratory effort. Examination patient's ankle-brachial indices shows a great toe pressure on the left of 64.  Patient has monophasic flow posterior tibial and dorsalis pedis arteries on the left.  Right foot shows monophasic posterior tibial flow and biphasic dorsalis pedis flow with ABIs of 0.67 and 0.74 on the right.  The venous stasis swelling is resolving he does have brawny skin color changes.  The right plantar ulcer is healed the dermatitis has resolved.  Imaging: No results found.   Labs: Lab Results  Component Value Date   HGBA1C 6.4 (H) 12/02/2019   ESRSEDRATE 28 (H) 01/18/2021   ESRSEDRATE 40 (H) 12/24/2020   CRP 11.1 (H) 01/18/2021   CRP 1.9 (H) 12/24/2020   REPTSTATUS 12/27/2020 FINAL 12/24/2020   GRAMSTAIN NO WBC SEEN NO ORGANISMS  SEEN  12/24/2020   CULT  12/24/2020    FEW STAPHYLOCOCCUS AUREUS WITHIN MIXED ORGANISMS Performed at [facility], [address] [place], [place] [address]    [facility] STAPHYLOCOCCUS AUREUS 12/24/2020     Lab Results  Component Value Date   ALBUMIN 3.9 12/01/2019   ALBUMIN 3.7 09/26/2017   ALBUMIN 2.3 (L) 08/17/2017    Lab Results  Component Value Date   MG 2.2 08/17/2017   MG 1.8 08/15/2017   No results found for: "VD25OH"  No results found for: "PREALBUMIN"    Latest Ref Rng & Units 08/10/2021    1:36 AM 07/13/2021    8:26 AM 04/15/2021    3:45 AM  CBC EXTENDED  WBC 4.0 - 10.5 K/uL 8.2   6.1   RBC 4.22 - 5.81 MIL/uL 4.49   4.42   Hemoglobin 13.0 - 17.0 g/dL 50.9  32.6  71.2   HCT 39.0 - 52.0 % 40.9  40.0  38.8   Platelets 150 - 400 K/uL 115   96      There is no height or weight on file to calculate BMI.  Orders:  No orders of the defined types were placed in this encounter.  No orders of the defined types were placed in this encounter.    Procedures: No procedures performed  Clinical Data: No additional findings.  ROS:  All other systems negative, except as noted in the HPI. Review of Systems  Objective: Vital Signs: There were no vitals taken for this visit.  Specialty Comments:  No specialty comments available.  PMFS History: Patient Active Problem List   Diagnosis Date Noted   Alcohol abuse 12/02/2021   Anxiety 12/02/2021   Right leg pain 12/02/2021   Chest pain 04/14/2021   CAD (coronary artery disease)    Unstable angina (HCC)    Venous insufficiency (chronic) (peripheral)    Ulcer of right foot limited to breakdown of skin (HCC)    Chest pain, rule out acute myocardial infarction 12/23/2020   PAD (peripheral artery disease) (HCC) 12/23/2020   HLD (hyperlipidemia) 12/23/2020   ACS (acute coronary syndrome) (HCC) 12/01/2019   Non-ST elevation (NSTEMI) myocardial infarction (HCC) 12/01/2019   NSTEMI (non-ST elevated  myocardial infarction) (HCC) 12/01/2019   History of transmetatarsal amputation of right foot (HCC)    Subacute osteomyelitis, right ankle and foot (HCC)    Infected blister of foot 08/13/2017   Pressure injury of skin 08/12/2017   Weakness 08/11/2017   COPD (chronic obstructive pulmonary disease) (HCC) 08/11/2017   Cellulitis of right lower extremity 08/11/2017   Fall at home, initial encounter 08/11/2017   Thrombocytopenia (HCC) 08/11/2017   Weakness generalized 08/11/2017   Hypokalemia 08/11/2017   Essential hypertension 08/11/2017   Past Medical History:  Diagnosis Date   Abdominal aortic aneurysm    no AAA on [place] in 2020   CAD (coronary artery disease)    Stent to RCA 2021   Chronic obstructive pulmonary disease    HLD (hyperlipidemia)    HTN (hypertension)    Peripheral artery disease    s/p R fem pop // s/p R transmet amp    Family History  Adopted: Yes    Past Surgical History:  Procedure Laterality Date   ABDOMINAL AORTOGRAM N/A 08/17/2017   Procedure: ABDOMINAL AORTOGRAM;  Surgeon: Maeola Harman, MD;  Location: [facility] INVASIVE CV LAB;  Service: Cardiovascular;  Laterality: N/A;   ABDOMINAL AORTOGRAM W/LOWER EXTREMITY Right 07/13/2021   Procedure: ABDOMINAL AORTOGRAM W/LOWER EXTREMITY;  Surgeon: Maeola Harman, MD;  Location: [facility] INVAS[facility] CV LAB;  Service: Cardiovascular;  Laterality: Right;   AMPUTATION Right 08/21/2017   Procedure: TRANSMETATARSAL AMPUTATION RIGHT FOOT;  Surgeon: Nadara Mustard, MD;  Location: [facility] OR;  Service: Orthopedics;  Laterality: Right;   AMPUTATION TOE Right 08/18/2017   Procedure: AMPUTATION RIGHT SECOND AND THIRD TOES;  Surgeon: Maeola Harman, MD;  Location: [facility] OR;  Service: Vascular;  Laterality: Right;   CORONARY BALLOON ANGIOPLASTY N/A 12/03/2019   Procedure: CORONARY BALLOON ANGIOPLASTY;  Surgeon: Corky Crafts, MD;  Location: [facility] INVASIVE CV LAB;  Service: Cardiovascular;  Laterality: N/A;   CORONARY STENT  INTERVENTION N/A 12/03/2019   Procedure: CORONARY STENT INTERVENTION;  Surgeon: Corky Crafts, MD;  Location: [facility] INVASIVE CV LAB;  Service: Cardiovascular;  Laterality: N/A;   ENDARTERECTOMY FEMORAL Right 08/18/2017   Procedure: ENDARTERECTOMY RIGHT SUPERFICIAL Alfredia Ferguson;  Surgeon: Maeola Harman, MD;  Location: [facility] OR;  Service: Vascular;  Laterality: Right;   FEMORAL-POPLITEAL BYPASS GRAFT Right 08/18/2017   Procedure: BYPASS GRAFT RIGHT COMMON FEMORAL TO ABOVE KNEE POPLITEAL ARTERY USING RIGHT REVERSED GREAT SAPHENOUS VEIN;  Surgeon: Maeola Harman, MD;  Location: [facility] OR;  Service: Vascular;  Laterality: Right;   LEFT HEART CATH AND CORONARY ANGIOGRAPHY N/A 12/03/2019   Procedure: LEFT HEART CATH AND CORONARY ANGIOGRAPHY;  Surgeon: Corky Crafts, MD;  Location: [facility] INVASIVE CV LAB;  Service: Cardiovascular;  Laterality: N/A;   LEFT HEART CATH AND CORONARY ANGIOGRAPHY N/A 04/14/2021   Procedure: LEFT HEART CATH AND CORONARY ANGIOGRAPHY;  Surgeon: Corky Crafts, MD;  Location: [facility] INVASIVE CV LAB;  Service: Cardiovascular;  Laterality: N/A;   LOWER EXTREMITY ANGIOGRAPHY Right 08/17/2017   Procedure: Lower Extremity Angiography;  Surgeon: Maeola Harman, MD;  Location: [facility] INVASIVE CV LAB;  Service: Cardiovascular;  Laterality: Right;   PERIPHERAL VASCULAR BALLOON ANGIOPLASTY Right 08/17/2017   Procedure: PERIPHERAL VASCULAR BALLOON ANGIOPLASTY;  Surgeon: Maeola Harman, MD;  Location: [facility] INVASIVE CV LAB;  Service: Cardiovascular;  Laterality: Right;  SFA UNABLE TO CROSS   VEIN HARVEST Right 08/18/2017   Procedure: VEIN HARVEST RIGHT GREAT SAPHENOUS;  Surgeon: Maeola Harman, MD;  Location: [facility] OR;  Service: Vascular;  Laterality: Right;   WOUND DEBRIDEMENT Right 08/18/2017   Procedure: DEBRIDEMENT WOUND RIGHT FOOT;  Surgeon: Maeola Harman, MD;  Location: [facility] OR;  Service: Vascular;  Laterality: Right;   Social History    Occupational History   Not on file  Tobacco Use   Smoking status: Former    Passive exposure: Never   Smokeless tobacco: Never  Vaping Use   Vaping Use: Former  Substance and Sexual Activity   Alcohol use: Not Currently   Drug use: No   Sexual activity: Not on file

## 2021-12-28 ENCOUNTER — Ambulatory Visit: Payer: Medicare HMO | Admitting: Orthopedic Surgery

## 2021-12-28 ENCOUNTER — Encounter: Payer: Self-pay | Admitting: Orthopedic Surgery

## 2021-12-28 DIAGNOSIS — L02611 Cutaneous abscess of right foot: Secondary | ICD-10-CM | POA: Diagnosis not present

## 2021-12-28 DIAGNOSIS — Z89431 Acquired absence of right foot: Secondary | ICD-10-CM

## 2021-12-28 MED ORDER — DOXYCYCLINE HYCLATE 100 MG PO TABS: 100.0000 mg | ORAL_TABLET | Freq: Two times a day (BID) | ORAL | 0 refills | Status: AC

## 2021-12-28 NOTE — Progress Notes (Unsigned)
Office Visit Note   Patient: Jorge Clements           Date of Birth: 03/05/1946           MRN: [id number] Visit Date: 12/28/2021              Requested by: Benita Stabile, MD [address] [place],  [place] [address] PCP: Benita Stabile, MD  Chief Complaint  Patient presents with   Right Foot - Follow-up      HPI: Patient is a 76 year old gentleman who presents with recurrent swallowing ulceration and drainage from the right transmetatarsal amputation.  Patient previously had an infection and was treated with doxycycline the infection resolved.  Patient states that for a week he has had a fever and chills he states he just felt hot he states he was unable to eat much and was unable to change the dressing for about a week.  Assessment & Plan: Visit Diagnoses:  1. History of transmetatarsal amputation of right foot (HCC)   2. Cutaneous abscess of right foot     Plan: Recommended proceeding with surgical intervention patient states he would like to proceed with antibiotics since that worked in the past will call in a prescription for doxycycline.  He will wash his foot with soap and water change the dressing daily.  Discussed that if he develops recurrent fever or chills he would need to go to [facility] emergency room to be admitted placed on IV antibiotics and obtain an MRI scan of the right foot.  Follow-Up Instructions: Return in about 2 weeks (around 01/11/2022).   Ortho Exam  Patient is alert, oriented, no adenopathy, well-dressed, normal affect, normal respiratory effort. Examination patient does have some venous swelling in the calf and leg with brawny edema but no ascending cellulitis.  Patient does have cellulitis of the right transmetatarsal amputation.  There is a plantar ulcer this was probed with a Q-tip and this probes 6 cm deep.  There is no purulent drainage.  Imaging: No results found. No images are attached to the encounter.  Labs: Lab Results  Component Value Date    HGBA1C 6.4 (H) 12/02/2019   ESRSEDRATE 28 (H) 01/18/2021   ESRSEDRATE 40 (H) 12/24/2020   CRP 11.1 (H) 01/18/2021   CRP 1.9 (H) 12/24/2020   REPTSTATUS 12/27/2020 FINAL 12/24/2020   GRAMSTAIN NO WBC SEEN NO ORGANISMS SEEN  12/24/2020   CULT  12/24/2020    FEW STAPHYLOCOCCUS AUREUS WITHIN MIXED ORGANISMS Performed at [facility], [address], [place], [place] [address]    [facility] STAPHYLOCOCCUS AUREUS 12/24/2020     Lab Results  Component Value Date   ALBUMIN 3.9 12/01/2019   ALBUMIN 3.7 09/26/2017   ALBUMIN 2.3 (L) 08/17/2017    Lab Results  Component Value Date   MG 2.2 08/17/2017   MG 1.8 08/15/2017   No results found for: "VD25OH"  No results found for: "PREALBUMIN"    Latest Ref Rng & Units 08/10/2021    1:36 AM 07/13/2021    8:26 AM 04/15/2021    3:45 AM  CBC EXTENDED  WBC 4.0 - 10.5 K/uL 8.2   6.1   RBC 4.22 - 5.81 MIL/uL 4.49   4.42   Hemoglobin 13.0 - 17.0 g/dL 43.3  29.5  18.8   HCT 39.0 - 52.0 % 40.9  40.0  38.8   Platelets 150 - 400 K/uL 115   96      There is no height  or weight on file to calculate BMI.  Orders:  No orders of the defined types were placed in this encounter.  No orders of the defined types were placed in this encounter.    Procedures: No procedures performed  Clinical Data: No additional findings.  ROS:  All other systems negative, except as noted in the HPI. Review of Systems  Objective: Vital Signs: There were no vitals taken for this visit.  Specialty Comments:  No specialty comments available.  PMFS History: Patient Active Problem List   Diagnosis Date Noted   Alcohol abuse 12/02/2021   Anxiety 12/02/2021   Right leg pain 12/02/2021   Chest pain 04/14/2021   CAD (coronary artery disease)    Unstable angina (HCC)    Venous insufficiency (chronic) (peripheral)    Ulcer of right foot limited to breakdown of skin (HCC)    Chest pain, rule out acute myocardial infarction 12/23/2020   PAD  (peripheral artery disease) (HCC) 12/23/2020   HLD (hyperlipidemia) 12/23/2020   ACS (acute coronary syndrome) (HCC) 12/01/2019   Non-ST elevation (NSTEMI) myocardial infarction (HCC) 12/01/2019   NSTEMI (non-ST elevated myocardial infarction) (HCC) 12/01/2019   History of transmetatarsal amputation of right foot (HCC)    Subacute osteomyelitis, right ankle and foot (HCC)    Infected blister of foot 08/13/2017   Pressure injury of skin 08/12/2017   Weakness 08/11/2017   COPD (chronic obstructive pulmonary disease) (HCC) 08/11/2017   Cellulitis of right lower extremity 08/11/2017   Fall at home, initial encounter 08/11/2017   Thrombocytopenia (HCC) 08/11/2017   Weakness generalized 08/11/2017   Hypokalemia 08/11/2017   Essential hypertension 08/11/2017   Past Medical History:  Diagnosis Date   Abdominal aortic aneurysm    no AAA on [place] in 2020   CAD (coronary artery disease)    Stent to RCA 2021   Chronic obstructive pulmonary disease    HLD (hyperlipidemia)    HTN (hypertension)    Peripheral artery disease    s/p R fem pop // s/p R transmet amp    Family History  Adopted: Yes    Past Surgical History:  Procedure Laterality Date   ABDOMINAL AORTOGRAM N/A 08/17/2017   Procedure: ABDOMINAL AORTOGRAM;  Surgeon: Maeola Harman, MD;  Location: [facility] INVASIVE CV LAB;  Service: Cardiovascular;  Laterality: N/A;   ABDOMINAL AORTOGRAM W/LOWER EXTREMITY Right 07/13/2021   Procedure: ABDOMINAL AORTOGRAM W/LOWER EXTREMITY;  Surgeon: Maeola Harman, MD;  Location: [facility] INVASIVE CV LAB;  Service: Cardiovascular;  Laterality: Right;   AMPUTATION Right 08/21/2017   Procedure: TRANSMETATARSAL AMPUTATION RIGHT FOOT;  Surgeon: Nadara Mustard, MD;  Location: [facility] OR;  Service: Orthopedics;  Laterality: Right;   AMPUTATION TOE Right 08/18/2017   Procedure: AMPUTATION RIGHT SECOND AND THIRD TOES;  Surgeon: Maeola Harman, MD;  Location: [facility] OR;  Service: Vascular;  Laterality:  Right;   CORONARY BALLOON ANGIOPLASTY N/A 12/03/2019   Procedure: CORONARY BALLOON ANGIOPLASTY;  Surgeon: Corky Crafts, MD;  Location: [facility] INVASIVE CV LAB;  Service: Cardiovascular;  Laterality: N/A;   CORONARY STENT INTERVENTION N/A 12/03/2019   Procedure: CORONARY STENT INTERVENTION;  Surgeon: Corky Crafts, MD;  Location: [facility] INVASIVE CV LAB;  Service: Cardiovascular;  Laterality: N/A;   ENDARTERECTOMY FEMORAL Right 08/18/2017   Procedure: ENDARTERECTOMY RIGHT SUPERFICIAL Alfredia Ferguson;  Surgeon: Maeola Harman, MD;  Location: [facility] OR;  Service: Vascular;  Laterality: Right;   FEMORAL-POPLITEAL BYPASS GRAFT Right 08/18/2017   Procedure: BYPASS GRAFT RIGHT COMMON FEMORAL TO ABOVE KNEE POPLITEAL ARTERY  USING RIGHT REVERSED GREAT SAPHENOUS VEIN;  Surgeon: Maeola Harman, MD;  Location: [facility] OR;  Service: Vascular;  Laterality: Right;   LEFT HEART CATH AND CORONARY ANGIOGRAPHY N/A 12/03/2019   Procedure: LEFT HEART CATH AND CORONARY ANGIOGRAPHY;  Surgeon: Corky Crafts, MD;  Location: [facility] INVASIVE CV LAB;  Service: Cardiovascular;  Laterality: N/A;   LEFT HEART CATH AND CORONARY ANGIOGRAPHY N/A 04/14/2021   Procedure: LEFT HEART CATH AND CORONARY ANGIOGRAPHY;  Surgeon: Corky Crafts, MD;  Location: [facility] INVASIVE CV LAB;  Service: Cardiovascular;  Laterality: N/A;   LOWER EXTREMITY ANGIOGRAPHY Right 08/17/2017   Procedure: Lower Extremity Angiography;  Surgeon: Maeola Harman, MD;  Location: [facility] INVASIVE CV LAB;  Service: Cardiovascular;  Laterality: Right;   PERIPHERAL VASCULAR BALLOON ANGIOPLASTY Right 08/17/2017   Procedure: PERIPHERAL VASCULAR BALLOON ANGIOPLASTY;  Surgeon: Maeola Harman, MD;  Location: [facility] INVASIVE CV LAB;  Service: Cardiovascular;  Laterality: Right;  SFA UNABLE TO CROSS   VEIN HARVEST Right 08/18/2017   Procedure: VEIN HARVEST RIGHT GREAT SAPHENOUS;  Surgeon: Maeola Harman, MD;  Location: [facility] OR;  Service:  Vascular;  Laterality: Right;   WOUND DEBRIDEMENT Right 08/18/2017   Procedure: DEBRIDEMENT WOUND RIGHT FOOT;  Surgeon: Maeola Harman, MD;  Location: [facility] OR;  Service: Vascular;  Laterality: Right;   Social History   Occupational History   Not on file  Tobacco Use   Smoking status: Former    Passive exposure: Never   Smokeless tobacco: Never  Vaping Use   Vaping Use: Former  Substance and Sexual Activity   Alcohol use: Not Currently   Drug use: No   Sexual activity: Not on file

## 2022-01-18 ENCOUNTER — Ambulatory Visit: Payer: Medicare HMO | Admitting: Orthopedic Surgery

## 2022-01-18 ENCOUNTER — Encounter: Payer: Self-pay | Admitting: Orthopedic Surgery

## 2022-01-18 DIAGNOSIS — Z89431 Acquired absence of right foot: Secondary | ICD-10-CM | POA: Diagnosis not present

## 2022-01-18 NOTE — Progress Notes (Signed)
Office Visit Note   Patient: Jorge Clements           Date of Birth: 11-02-1945           MRN: [id number] Visit Date: 01/18/2022              Requested by: Celene Squibb, MD [address] [place],  [place] [address] PCP: Celene Squibb, MD  Chief Complaint  Patient presents with   Right Foot - Wound Check      HPI: Patient is a 76 year old gentleman who presents in follow-up for nonhealing wounds right foot status post transmetatarsal amputation.  Patient states he has 10 days left of antibiotics.  Assessment & Plan: Visit Diagnoses:  1. History of transmetatarsal amputation of right foot ([place])     Plan: Complete the antibiotics continue with the compression sock and elevation.  Follow-Up Instructions: Return in about 4 weeks (around 02/15/2022).   Ortho Exam  Patient is alert, oriented, no adenopathy, well-dressed, normal affect, normal respiratory effort. Examination patient shows significant healing of the foot wounds.  There is almost complete epithelialization of the foot all the nonviable tissue was removed silver nitrate was used hemostasis.  Patient has had a significant decrease in swelling he states he feels much better.  Imaging: No results found.     Labs: Lab Results  Component Value Date   HGBA1C 6.4 (H) 12/02/2019   ESRSEDRATE 28 (H) 01/18/2021   ESRSEDRATE 40 (H) 12/24/2020   CRP 11.1 (H) 01/18/2021   CRP 1.9 (H) 12/24/2020   REPTSTATUS 12/27/2020 FINAL 12/24/2020   GRAMSTAIN NO WBC SEEN NO ORGANISMS SEEN  12/24/2020   CULT  12/24/2020    FEW STAPHYLOCOCCUS AUREUS WITHIN MIXED ORGANISMS Performed at [facility], [place] [address] [place], [place] [address]    LABORGA STAPHYLOCOCCUS AUREUS 12/24/2020     Lab Results  Component Value Date   ALBUMIN 3.9 12/01/2019   ALBUMIN 3.7 09/26/2017   ALBUMIN 2.3 (L) 08/17/2017    Lab Results  Component Value Date   MG 2.2 08/17/2017   MG 1.8 08/15/2017   No results found for:  "VD25OH"  No results found for: "PREALBUMIN"    Latest Ref Rng & Units 08/10/2021    1:36 AM 07/13/2021    8:26 AM 04/15/2021    3:45 AM  CBC EXTENDED  WBC 4.0 - 10.5 K/uL 8.2   6.1   RBC 4.22 - 5.81 MIL/uL 4.49   4.42   Hemoglobin 13.0 - 17.0 g/dL 13.5  13.6  12.3   HCT 39.0 - 52.0 % 40.9  40.0  38.8   Platelets 150 - 400 K/uL 115   96      There is no height or weight on file to calculate BMI.  Orders:  No orders of the defined types were placed in this encounter.  No orders of the defined types were placed in this encounter.    Procedures: No procedures performed  Clinical Data: No additional findings.  ROS:  All other systems negative, except as noted in the HPI. Review of Systems  Objective: Vital Signs: There were no vitals taken for this visit.  Specialty Comments:  No specialty comments available.  PMFS History: Patient Active Problem List   Diagnosis Date Noted   Alcohol abuse 12/02/2021   Anxiety 12/02/2021   Right leg pain 12/02/2021   Chest pain 04/14/2021   CAD (coronary artery disease)    Unstable angina (HCC)  Venous insufficiency (chronic) (peripheral)    Ulcer of right foot limited to breakdown of skin (HCC)    Chest pain, rule out acute myocardial infarction 12/23/2020   PAD (peripheral artery disease) (HCC) 12/23/2020   HLD (hyperlipidemia) 12/23/2020   ACS (acute coronary syndrome) (HCC) 12/01/2019   Non-ST elevation (NSTEMI) myocardial infarction (HCC) 12/01/2019   NSTEMI (non-ST elevated myocardial infarction) (HCC) 12/01/2019   History of transmetatarsal amputation of right foot (HCC)    Subacute osteomyelitis, right ankle and foot (HCC)    Infected blister of foot 08/13/2017   Pressure injury of skin 08/12/2017   Weakness 08/11/2017   COPD (chronic obstructive pulmonary disease) (HCC) 08/11/2017   Cellulitis of right lower extremity 08/11/2017   Fall at home, initial encounter 08/11/2017   Thrombocytopenia (HCC) 08/11/2017    Weakness generalized 08/11/2017   Hypokalemia 08/11/2017   Essential hypertension 08/11/2017   Past Medical History:  Diagnosis Date   Abdominal aortic aneurysm    no AAA on [place] in 2020   CAD (coronary artery disease)    Stent to RCA 2021   Chronic obstructive pulmonary disease    HLD (hyperlipidemia)    HTN (hypertension)    Peripheral artery disease    s/p R fem pop // s/p R transmet amp    Family History  Adopted: Yes    Past Surgical History:  Procedure Laterality Date   ABDOMINAL AORTOGRAM N/A 08/17/2017   Procedure: ABDOMINAL AORTOGRAM;  Surgeon: Maeola Harman, MD;  Location: [facility] INVASIVE CV LAB;  Service: Cardiovascular;  Laterality: N/A;   ABDOMINAL AORTOGRAM W/LOWER EXTREMITY Right 07/13/2021   Procedure: ABDOMINAL AORTOGRAM W/LOWER EXTREMITY;  Surgeon: Maeola Harman, MD;  Location: [facility] INVASIVE CV LAB;  Service: Cardiovascular;  Laterality: Right;   AMPUTATION Right 08/21/2017   Procedure: TRANSMETATARSAL AMPUTATION RIGHT FOOT;  Surgeon: Nadara Mustard, MD;  Location: [facility] OR;  Service: Orthopedics;  Laterality: Right;   AMPUTATION TOE Right 08/18/2017   Procedure: AMPUTATION RIGHT SECOND AND THIRD TOES;  Surgeon: Maeola Harman, MD;  Location: [facility] OR;  Service: Vascular;  Laterality: Right;   CORONARY BALLOON ANGIOPLASTY N/A 12/03/2019   Procedure: CORONARY BALLOON ANGIOPLASTY;  Surgeon: Corky Crafts, MD;  Location: [facility] INVASIVE CV LAB;  Service: Cardiovascular;  Laterality: N/A;   CORONARY STENT INTERVENTION N/A 12/03/2019   Procedure: CORONARY STENT INTERVENTION;  Surgeon: Corky Crafts, MD;  Location: [facility] INVASIVE CV LAB;  Service: Cardiovascular;  Laterality: N/A;   ENDARTERECTOMY FEMORAL Right 08/18/2017   Procedure: ENDARTERECTOMY RIGHT SUPERFICIAL Alfredia Ferguson;  Surgeon: Maeola Harman, MD;  Location: [facility] OR;  Service: Vascular;  Laterality: Right;   FEMORAL-POPLITEAL BYPASS GRAFT Right 08/18/2017   Procedure: BYPASS  GRAFT RIGHT COMMON FEMORAL TO ABOVE KNEE POPLITEAL ARTERY USING RIGHT REVERSED GREAT SAPHENOUS VEIN;  Surgeon: Maeola Harman, MD;  Location: [facility] OR;  Service: Vascular;  Laterality: Right;   LEFT HEART CATH AND CORONARY ANGIOGRAPHY N/A 12/03/2019   Procedure: LEFT HEART CATH AND CORONARY ANGIOGRAPHY;  Surgeon: Corky Crafts, MD;  Location: [facility] INVASIVE CV LAB;  Service: Cardiovascular;  Laterality: N/A;   LEFT HEART CATH AND CORONARY ANGIOGRAPHY N/A 04/14/2021   Procedure: LEFT HEART CATH AND CORONARY ANGIOGRAPHY;  Surgeon: Corky Crafts, MD;  Location: [facility] INVASIVE CV LAB;  Service: Cardiovascular;  Laterality: N/A;   LOWER EXTREMITY ANGIOGRAPHY Right 08/17/2017   Procedure: Lower Extremity Angiography;  Surgeon: Maeola Harman, MD;  Location: [facility] INVASIVE CV LAB;  Service: Cardiovascular;  Laterality: Right;  PERIPHERAL VASCULAR BALLOON ANGIOPLASTY Right 08/17/2017   Procedure: PERIPHERAL VASCULAR BALLOON ANGIOPLASTY;  Surgeon: Maeola Harman, MD;  Location: [facility] INVASIVE CV LAB;  Service: Cardiovascular;  Laterality: Right;  SFA UNABLE TO CROSS   VEIN HARVEST Right 08/18/2017   Procedure: VEIN HARVEST RIGHT GREAT SAPHENOUS;  Surgeon: Maeola Harman, MD;  Location: [facility] OR;  Service: Vascular;  Laterality: Right;   WOUND DEBRIDEMENT Right 08/18/2017   Procedure: DEBRIDEMENT WOUND RIGHT FOOT;  Surgeon: Maeola Harman, MD;  Location: [facility] OR;  Service: Vascular;  Laterality: Right;   Social History   Occupational History   Not on file  Tobacco Use   Smoking status: Former    Passive exposure: Never   Smokeless tobacco: Never  Vaping Use   Vaping Use: Former  Substance and Sexual Activity   Alcohol use: Not Currently   Drug use: No   Sexual activity: Not on file

## 2022-02-01 ENCOUNTER — Other Ambulatory Visit: Payer: Self-pay | Admitting: Cardiovascular Disease

## 2022-02-02 ENCOUNTER — Other Ambulatory Visit: Payer: Self-pay | Admitting: Cardiovascular Disease

## 2022-02-15 ENCOUNTER — Ambulatory Visit: Payer: Medicare HMO | Admitting: Orthopedic Surgery

## 2022-03-15 ENCOUNTER — Ambulatory Visit: Payer: Medicare HMO | Admitting: Orthopedic Surgery

## 2022-03-15 DIAGNOSIS — Z89431 Acquired absence of right foot: Secondary | ICD-10-CM

## 2022-03-15 DIAGNOSIS — L97511 Non-pressure chronic ulcer of other part of right foot limited to breakdown of skin: Secondary | ICD-10-CM | POA: Diagnosis not present

## 2022-03-21 ENCOUNTER — Encounter: Payer: Self-pay | Admitting: Orthopedic Surgery

## 2022-03-21 NOTE — Progress Notes (Signed)
Office Visit Note   Patient: Jorge Clements           Date of Birth: 09-Feb-1946           MRN: [id number] Visit Date: 03/15/2022              Requested by: Celene Squibb, MD [address] [place],  [place] [address] PCP: Celene Squibb, MD  Chief Complaint  Patient presents with   Right Foot - Wound Check    Transmet ulcer      HPI: Patient is a 76 year old gentleman who is status post a right transmetatarsal amputation.  Patient is using compression socks and elevation.  Patient complains of muscle spasms in his hands.  Assessment & Plan: Visit Diagnoses: No diagnosis found.  Plan: Ulcer was debrided continue with wound care and his compression sock.  Recommended trying coconut water for the muscle spasms he is having in his hands.  Follow-Up Instructions: Return in about 3 months (around 06/15/2022).   Ortho Exam  Patient is alert, oriented, no adenopathy, well-dressed, normal affect, normal respiratory effort. Examination patient has venous stasis changes with a stable small ulcer of the medial left ankle.  Patient has a plantar ulcer that is 3 cm in diameter.  After informed consent a 10 blade knife was used to debride the skin and soft tissue back to healthy viable tissue.  The ulcer was 3 cm in diameter 3 mm deep after debridement.  No exposed bone or tendon.  Imaging: No results found. No images are attached to the encounter.  Labs: Lab Results  Component Value Date   HGBA1C 6.4 (H) 12/02/2019   ESRSEDRATE 28 (H) 01/18/2021   ESRSEDRATE 40 (H) 12/24/2020   CRP 11.1 (H) 01/18/2021   CRP 1.9 (H) 12/24/2020   REPTSTATUS 12/27/2020 FINAL 12/24/2020   GRAMSTAIN NO WBC SEEN NO ORGANISMS SEEN  12/24/2020   CULT  12/24/2020    FEW STAPHYLOCOCCUS AUREUS WITHIN MIXED ORGANISMS Performed at [facility], [place] [address], [place], [place] [address]    LABORGA STAPHYLOCOCCUS AUREUS 12/24/2020     Lab Results  Component Value Date   ALBUMIN 3.9 12/01/2019    ALBUMIN 3.7 09/26/2017   ALBUMIN 2.3 (L) 08/17/2017    Lab Results  Component Value Date   MG 2.2 08/17/2017   MG 1.8 08/15/2017   No results found for: "VD25OH"  No results found for: "PREALBUMIN"    Latest Ref Rng & Units 08/10/2021    1:36 AM 07/13/2021    8:26 AM 04/15/2021    3:45 AM  CBC EXTENDED  WBC 4.0 - 10.5 K/uL 8.2   6.1   RBC 4.22 - 5.81 MIL/uL 4.49   4.42   Hemoglobin 13.0 - 17.0 g/dL 13.5  13.6  12.3   HCT 39.0 - 52.0 % 40.9  40.0  38.8   Platelets 150 - 400 K/uL 115   96      There is no height or weight on file to calculate BMI.  Orders:  No orders of the defined types were placed in this encounter.  No orders of the defined types were placed in this encounter.    Procedures: No procedures performed  Clinical Data: No additional findings.  ROS:  All other systems negative, except as noted in the HPI. Review of Systems  Objective: Vital Signs: There were no vitals taken for this visit.  Specialty Comments:  No specialty comments available.  PMFS History: Patient Active  Problem List   Diagnosis Date Noted   Alcohol abuse 12/02/2021   Anxiety 12/02/2021   Right leg pain 12/02/2021   Chest pain 04/14/2021   CAD (coronary artery disease)    Unstable angina (HCC)    Venous insufficiency (chronic) (peripheral)    Ulcer of right foot limited to breakdown of skin (HCC)    Chest pain, rule out acute myocardial infarction 12/23/2020   PAD (peripheral artery disease) (HCC) 12/23/2020   HLD (hyperlipidemia) 12/23/2020   ACS (acute coronary syndrome) (HCC) 12/01/2019   Non-ST elevation (NSTEMI) myocardial infarction (HCC) 12/01/2019   NSTEMI (non-ST elevated myocardial infarction) (HCC) 12/01/2019   History of transmetatarsal amputation of right foot (HCC)    Subacute osteomyelitis, right ankle and foot (HCC)    Infected blister of foot 08/13/2017   Pressure injury of skin 08/12/2017   Weakness 08/11/2017   COPD (chronic obstructive pulmonary  disease) (HCC) 08/11/2017   Cellulitis of right lower extremity 08/11/2017   Fall at home, initial encounter 08/11/2017   Thrombocytopenia (HCC) 08/11/2017   Weakness generalized 08/11/2017   Hypokalemia 08/11/2017   Essential hypertension 08/11/2017   Past Medical History:  Diagnosis Date   Abdominal aortic aneurysm    no AAA on [place] in 2020   CAD (coronary artery disease)    Stent to RCA 2021   Chronic obstructive pulmonary disease    HLD (hyperlipidemia)    HTN (hypertension)    Peripheral artery disease    s/p R fem pop // s/p R transmet amp    Family History  Adopted: Yes    Past Surgical History:  Procedure Laterality Date   ABDOMINAL AORTOGRAM N/A 08/17/2017   Procedure: ABDOMINAL AORTOGRAM;  Surgeon: Maeola Harman, MD;  Location: [facility] INVASIVE CV LAB;  Service: Cardiovascular;  Laterality: N/A;   ABDOMINAL AORTOGRAM W/LOWER EXTREMITY Right 07/13/2021   Procedure: ABDOMINAL AORTOGRAM W/LOWER EXTREMITY;  Surgeon: Maeola Harman, MD;  Location: [facility] INVASIVE CV LAB;  Service: Cardiovascular;  Laterality: Right;   AMPUTATION Right 08/21/2017   Procedure: TRANSMETATARSAL AMPUTATION RIGHT FOOT;  Surgeon: Nadara Mustard, MD;  Location: [facility] OR;  Service: Orthopedics;  Laterality: Right;   AMPUTATION TOE Right 08/18/2017   Procedure: AMPUTATION RIGHT SECOND AND THIRD TOES;  Surgeon: Maeola Harman, MD;  Location: [facility] OR;  Service: Vascular;  Laterality: Right;   CORONARY BALLOON ANGIOPLASTY N/A 12/03/2019   Procedure: CORONARY BALLOON ANGIOPLASTY;  Surgeon: Corky Crafts, MD;  Location: [facility] INVASIVE CV LAB;  Service: Cardiovascular;  Laterality: N/A;   CORONARY STENT INTERVENTION N/A 12/03/2019   Procedure: CORONARY STENT INTERVENTION;  Surgeon: Corky Crafts, MD;  Location: [facility] INVASIVE CV LAB;  Service: Cardiovascular;  Laterality: N/A;   ENDARTERECTOMY FEMORAL Right 08/18/2017   Procedure: ENDARTERECTOMY RIGHT SUPERFICIAL Alfredia Ferguson;  Surgeon:  Maeola Harman, MD;  Location: [facility] OR;  Service: Vascular;  Laterality: Right;   FEMORAL-POPLITEAL BYPASS GRAFT Right 08/18/2017   Procedure: BYPASS GRAFT RIGHT COMMON FEMORAL TO ABOVE KNEE POPLITEAL ARTERY USING RIGHT REVERSED GREAT SAPHENOUS VEIN;  Surgeon: Maeola Harman, MD;  Location: [facility] OR;  Service: Vascular;  Laterality: Right;   LEFT HEART CATH AND CORONARY ANGIOGRAPHY N/A 12/03/2019   Procedure: LEFT HEART CATH AND CORONARY ANGIOGRAPHY;  Surgeon: Corky Crafts, MD;  Location: [facility] INVASIVE CV LAB;  Service: Cardiovascular;  Laterality: N/A;   LEFT HEART CATH AND CORONARY ANGIOGRAPHY N/A 04/14/2021   Procedure: LEFT HEART CATH AND CORONARY ANGIOGRAPHY;  Surgeon: Corky Crafts, MD;  Location: [facility]  INVASIVE CV LAB;  Service: Cardiovascular;  Laterality: N/A;   LOWER EXTREMITY ANGIOGRAPHY Right 08/17/2017   Procedure: Lower Extremity Angiography;  Surgeon: Waynetta Sandy, MD;  Location: [place] CV LAB;  Service: Cardiovascular;  Laterality: Right;   PERIPHERAL VASCULAR BALLOON ANGIOPLASTY Right 08/17/2017   Procedure: PERIPHERAL VASCULAR BALLOON ANGIOPLASTY;  Surgeon: Waynetta Sandy, MD;  Location: [place] CV LAB;  Service: Cardiovascular;  Laterality: Right;  SFA UNABLE TO CROSS   VEIN HARVEST Right 08/18/2017   Procedure: VEIN HARVEST RIGHT GREAT SAPHENOUS;  Surgeon: Waynetta Sandy, MD;  Location: [place];  Service: Vascular;  Laterality: Right;   WOUND DEBRIDEMENT Right 08/18/2017   Procedure: DEBRIDEMENT WOUND RIGHT FOOT;  Surgeon: Waynetta Sandy, MD;  Location: [place];  Service: Vascular;  Laterality: Right;   Social History   Occupational History   Not on file  Tobacco Use   Smoking status: Former    Passive exposure: Never   Smokeless tobacco: Never  Vaping Use   Vaping Use: Former  Substance and Sexual Activity   Alcohol use: Not Currently   Drug use: No   Sexual activity: Not on file

## 2022-03-23 ENCOUNTER — Emergency Department (HOSPITAL_COMMUNITY)
Admission: EM | Admit: 2022-03-23 | Discharge: 2022-03-24 | Disposition: A | Discharge status: Home / Self Care | Payer: Medicare HMO | Attending: Emergency Medicine | Admitting: Emergency Medicine

## 2022-03-23 ENCOUNTER — Emergency Department (HOSPITAL_COMMUNITY): Payer: Medicare HMO

## 2022-03-23 ENCOUNTER — Other Ambulatory Visit: Payer: Self-pay

## 2022-03-23 DIAGNOSIS — R200 Anesthesia of skin: Secondary | ICD-10-CM | POA: Diagnosis not present

## 2022-03-23 DIAGNOSIS — R079 Chest pain, unspecified: Secondary | ICD-10-CM

## 2022-03-23 DIAGNOSIS — Z79899 Other long term (current) drug therapy: Secondary | ICD-10-CM | POA: Insufficient documentation

## 2022-03-23 DIAGNOSIS — J449 Chronic obstructive pulmonary disease, unspecified: Secondary | ICD-10-CM | POA: Diagnosis not present

## 2022-03-23 DIAGNOSIS — R0789 Other chest pain: Secondary | ICD-10-CM | POA: Insufficient documentation

## 2022-03-23 DIAGNOSIS — R002 Palpitations: Secondary | ICD-10-CM | POA: Insufficient documentation

## 2022-03-23 DIAGNOSIS — J9811 Atelectasis: Secondary | ICD-10-CM | POA: Diagnosis not present

## 2022-03-23 DIAGNOSIS — I2510 Atherosclerotic heart disease of native coronary artery without angina pectoris: Secondary | ICD-10-CM | POA: Diagnosis not present

## 2022-03-23 DIAGNOSIS — R0602 Shortness of breath: Secondary | ICD-10-CM | POA: Diagnosis not present

## 2022-03-23 DIAGNOSIS — Z7902 Long term (current) use of antithrombotics/antiplatelets: Secondary | ICD-10-CM | POA: Insufficient documentation

## 2022-03-23 LAB — COMPREHENSIVE METABOLIC PANEL
ALT: 10 U/L (ref 0–44)
AST: 20 U/L (ref 15–41)
Albumin: 4 g/dL (ref 3.5–5.0)
Alkaline Phosphatase: 91 U/L (ref 38–126)
Anion gap: 11 (ref 5–15)
BUN: 21 mg/dL (ref 8–23)
CO2: 25 mmol/L (ref 22–32)
Calcium: 9.2 mg/dL (ref 8.9–10.3)
Chloride: 99 mmol/L (ref 98–111)
Creatinine, Ser: 1.03 mg/dL (ref 0.61–1.24)
GFR, Estimated: >60 mL/min (ref >60)
Glucose, Bld: 122 mg/dL — ABNORMAL HIGH (ref 70–99)
Potassium: 4.1 mmol/L (ref 3.5–5.1)
Sodium: 135 mmol/L (ref 135–145)
Total Bilirubin: 1.1 mg/dL (ref 0.3–1.2)
Total Protein: 7.5 g/dL (ref 6.5–8.1)

## 2022-03-23 LAB — CBC WITH DIFFERENTIAL/PLATELET
Abs Immature Granulocytes: 0.06 10*3/uL (ref 0.00–0.07)
Basophils Absolute: 0.1 10*3/uL (ref 0.0–0.1)
Basophils Relative: 1 %
Eosinophils Absolute: 0 10*3/uL (ref 0.0–0.5)
Eosinophils Relative: 0 %
HCT: 43.5 % (ref 39.0–52.0)
Hemoglobin: 14 g/dL (ref 13.0–17.0)
Immature Granulocytes: 1 %
Lymphocytes Relative: 14 %
Lymphs Abs: 1.4 10*3/uL (ref 0.7–4.0)
MCH: 30 pg (ref 26.0–34.0)
MCHC: 32.2 g/dL (ref 30.0–36.0)
MCV: 93.1 fL (ref 80.0–100.0)
Monocytes Absolute: 0.6 10*3/uL (ref 0.1–1.0)
Monocytes Relative: 6 %
Neutro Abs: 7.5 10*3/uL (ref 1.7–7.7)
Neutrophils Relative %: 78 %
Platelets: 132 10*3/uL — ABNORMAL LOW (ref 150–400)
RBC: 4.67 MIL/uL (ref 4.22–5.81)
RDW: 14.9 % (ref 11.5–15.5)
WBC: 9.6 10*3/uL (ref 4.0–10.5)
nRBC: 0 % (ref 0.0–0.2)

## 2022-03-23 LAB — TROPONIN I (HIGH SENSITIVITY): Troponin I (High Sensitivity): 4 ng/L (ref <18)

## 2022-03-23 LAB — BRAIN NATRIURETIC PEPTIDE: B Natriuretic Peptide: 56.2 pg/mL (ref 0.0–100.0)

## 2022-03-23 NOTE — ED Provider Triage Note (Signed)
Emergency Medicine Provider Triage Evaluation Note  Jorge Clements , a 76 y.o. male  was evaluated in triage.  Pt complains of "I think I am having a heart attack".  History of COPD, CAD with stent, peripheral artery disease status post right femoropopliteal bypass, aortic aneurysm presenting with palpitations, irregular heartbeat, numbness to left arm with feeling "flushed".  Symptoms onset about 3 hours ago.  Reports pressure behind his eyes, palpitations, left-sided arm numbness with shortness of breath.  Denies chest pain but feels "uncomfortable".  Breathing is at baseline.  No new cough or fever.  No new leg swelling.  No focal weakness, numbness or tingling..  Review of Systems  Positive: Palpitations, left arm numbness, feeling flushed, shortness of breath Negative: Chest pain, headache, visual changes  Physical Exam  BP (!) 146/89 (BP Location: Right Arm)   Pulse 78   Temp (!) 97.4 F (36.3 C) (Oral)   Resp 18   SpO2 97%  Gen:   Awake, no distress   Resp:  Normal effort  MSK:   Moves extremities without difficulty  Other:  5/5 strength throughout, no pronator drift, no ataxia on finger-to-nose, no facial droop  Medical Decision Making  Medically screening exam initiated at 8:02 PM.  Appropriate orders placed.  Jorge Clements was informed that the remainder of the evaluation will be completed by another provider, this initial triage assessment does not replace that evaluation, and the importance of remaining in the ED until their evaluation is complete.  EKG, labs, chest x-ray, no focal neurological deficits   Glynn Octave, MD 03/23/22 2003

## 2022-03-23 NOTE — ED Triage Notes (Signed)
Pt arrived complaining irregular heart beat, left arm numbness and pressure behind both eyes   Symptoms started about 3 hrs ago   Hx of heart attack about 3 yrs ago

## 2022-03-24 LAB — TROPONIN I (HIGH SENSITIVITY): Troponin I (High Sensitivity): 8 ng/L (ref <18)

## 2022-03-24 NOTE — ED Provider Notes (Signed)
[facility] EMERGENCY DEPARTMENT Provider Note   CSN: [id number] Arrival date & time: 03/23/22  1906     History  Chief Complaint  Patient presents with   Chest Pain    Jorge Clements is a 76 y.o. male.   Chest Pain  Patient has a history of COPD, coronary artery disease, aortic aneurysm who presented to the ED with complaints of chest discomfort and a concern that he was having a heart attack.  Patient states he was at home yesterday when he started feeling flushed.  He had some pressure behind his eyes.  He also experienced palpitations like his heart was beating irregularly.  He also felt some numbness in his left arm.  Patient denies any weakness.  No new trouble with his gait.  No trouble with his speech.    Home Medications Prior to Admission medications   Medication Sig Start Date End Date Taking? Authorizing Provider  BREZTRI AEROSPHERE 160-9-4.8 MCG/ACT AERO Inhale 2 puffs into the lungs 2 (two) times daily. 07/09/19   [provider]  clopidogrel (PLAVIX) 75 MG tablet Take 1 tablet by mouth once daily 12/16/21   Wendall Stade, MD  doxycycline (VIBRA-TABS) 100 MG tablet Take 1 tablet (100 mg total) by mouth 2 (two) times daily. 12/28/21   Nadara Mustard, MD  isosorbide mononitrate (IMDUR) 30 MG 24 hr tablet Take 1 tablet by mouth once daily 02/01/22   Wendall Stade, MD  losartan (COZAAR) 50 MG tablet Take 1 tablet (50 mg total) by mouth daily. 12/14/21   Wendall Stade, MD  metoprolol tartrate (LOPRESSOR) 25 MG tablet Take 1 tablet by mouth twice daily 02/03/22   Wendall Stade, MD  Multiple Vitamins-Minerals (CENTRUM SILVER 50+MEN PO) Take 1 tablet by mouth daily.    [provider]  niacin 500 MG tablet Take 500 mg by mouth daily.    [provider]  nitroGLYCERIN (NITROSTAT) 0.4 MG SL tablet Place 0.4 mg under the tongue every 5 (five) minutes as needed for chest pain.    [provider]  rosuvastatin (CRESTOR) 40  MG tablet Take 1 tablet by mouth once daily 11/03/21   Wendall Stade, MD  sulfamethoxazole-trimethoprim (BACTRIM) 400-80 MG tablet Take 1 tablet by mouth 2 (two) times daily. 09/02/21   Maeola Harman, MD  Vitamin D-Vitamin K (VITAMIN K2-VITAMIN D3 PO) Take 1 tablet by mouth daily.    [provider]  vitamin E 180 MG (400 UNITS) capsule Take 400 Units by mouth daily.    [provider]      Allergies    Penicillins and Clindamycin    Review of Systems   Review of Systems  Cardiovascular:  Positive for chest pain.    Physical Exam Updated Vital Signs BP (!) 148/68   Pulse 73   Temp 97.6 F (36.4 C)   Resp 18   Ht 1.829 m (6')   Wt 127 kg   SpO2 100%   BMI 37.97 kg/m  Physical Exam Vitals and nursing note reviewed.  Constitutional:      General: He is not in acute distress.    Appearance: He is well-developed.     Comments: Elevated BMI  HENT:     Head: Normocephalic and atraumatic.     Right Ear: External ear normal.     Left Ear: External ear normal.  Eyes:     General: No visual field deficit or scleral icterus.  Right eye: No discharge.        Left eye: No discharge.     Conjunctiva/sclera: Conjunctivae normal.  Neck:     Trachea: No tracheal deviation.  Cardiovascular:     Rate and Rhythm: Normal rate and regular rhythm.  Pulmonary:     Effort: Pulmonary effort is normal. No respiratory distress.     Breath sounds: Normal breath sounds. No stridor. No wheezing or rales.  Abdominal:     General: Bowel sounds are normal. There is no distension.     Palpations: Abdomen is soft.     Tenderness: There is no abdominal tenderness. There is no guarding or rebound.  Musculoskeletal:        General: No tenderness or deformity.     Cervical back: Neck supple.  Skin:    General: Skin is warm and dry.     Findings: No rash.  Neurological:     General: No focal deficit present.     Mental Status: He is alert.     Cranial Nerves: No  cranial nerve deficit (no facial droop, extraocular movements intact, no slurred speech), dysarthria or facial asymmetry.     Sensory: Sensation is intact. No sensory deficit.     Motor: No weakness, tremor, abnormal muscle tone or seizure activity.     Coordination: Coordination normal.  Psychiatric:        Mood and Affect: Mood normal.     ED Results / Procedures / Treatments   Labs (all labs ordered are listed, but only abnormal results are displayed) Labs Reviewed  CBC WITH DIFFERENTIAL/PLATELET - Abnormal; Notable for the following components:      Result Value   Platelets 132 (*)    All other components within normal limits  COMPREHENSIVE METABOLIC PANEL - Abnormal; Notable for the following components:   Glucose, Bld 122 (*)    All other components within normal limits  BRAIN NATRIURETIC PEPTIDE  TROPONIN I (HIGH SENSITIVITY)  TROPONIN I (HIGH SENSITIVITY)  TROPONIN I (HIGH SENSITIVITY)    EKG EKG Interpretation  Date/Time:  Tuesday March 23 2022 19:51:22 EST Ventricular Rate:  83 PR Interval:  218 QRS Duration: 78 QT Interval:  370 QTC Calculation: 434 R Axis:   -43 Text Interpretation: Sinus rhythm with 1st degree A-V block Left axis deviation Low voltage QRS Cannot rule out Anteroseptal infarct , age undetermined Abnormal ECG When compared with ECG of 09-Aug-2021 22:51, PREVIOUS ECG IS PRESENT No significant change was found Confirmed by Glynn Octave [phone]) on 03/23/2022 8:02:00 PM  Radiology CT Head Wo Contrast  Result Date: 03/23/2022 CLINICAL DATA:  Numbness in left arm, palpitations EXAM: CT HEAD WITHOUT CONTRAST TECHNIQUE: Contiguous axial images were obtained from the base of the skull through the vertex without intravenous contrast. RADIATION DOSE REDUCTION: This exam was performed according to the departmental dose-optimization program which includes automated exposure control, adjustment of the mA and/or kV according to patient size and/or use of  iterative reconstruction technique. COMPARISON:  08/11/2017 FINDINGS: Brain: No acute intracranial findings are seen. There are no signs of bleeding within the cranium. Cortical sulci are prominent. There is no focal edema or mass effect. Vascular: Unremarkable. Skull: Unremarkable. Sinuses/Orbits: Unremarkable. Other: No significant interval changes are noted. IMPRESSION: No acute intracranial findings are seen in noncontrast CT brain. Atrophy. Electronically Signed   By: Ernie Avena M.D.   On: 03/23/2022 20:44   DG Chest 2 View  Result Date: 03/23/2022 CLINICAL DATA:  Chest pain. EXAM: CHEST -  2 VIEW COMPARISON:  Chest radiograph dated 04/13/2021. FINDINGS: Minimal left lung base atelectasis. No focal consolidation, pleural effusion, or pneumothorax. The cardiac silhouette is within normal limits. No acute osseous pathology. IMPRESSION: No active cardiopulmonary disease. Electronically Signed   By: Elgie Collard M.D.   On: 03/23/2022 20:39    Procedures Procedures    Medications Ordered in ED Medications - No data to display  ED Course/ Medical Decision Making/ A&P Clinical Course as of 03/24/22 1559  Wed Mar 24, 2022  1308 CBC with Differential(!) Normal [JK]  1308 Comprehensive metabolic panel(!) Normal [JK]  1308 Troponin I (High Sensitivity) Normal [JK]  1308 Brain natriuretic peptide Normal [JK]  1309 CT without acute findings.  Chest x-ray without acute findings [JK]    Clinical Course User Index [JK] Linwood Dibbles, MD                           Medical Decision Making Problems Addressed: Chest pain, unspecified type: acute illness or injury that poses a threat to life or bodily functions  Amount and/or Complexity of Data Reviewed Labs: ordered. Decision-making details documented in ED Course. Radiology: ordered and independent interpretation performed.    Details: Acute findings on chest x-ray   Presented to ED with complaints of chest discomfort and  palpitations.  History atypical for ACS however patient does have known coronary artery disease and risk factors.  ED workup fortunately reassuring.  Moderate risk heart score but serial troponins are normal.  I think he is appropriate for close outpatient cardiology follow-up.  Patient did complain of some numbness in his fingers but does not have any focal deficits.  Head CT without acute findings.  Low suspicion for stroke TIA.  Is possible he could have some type of radiculopathy.  Evaluation and diagnostic testing in the emergency department does not suggest an emergent condition requiring admission or immediate intervention beyond what has been performed at this time.  The patient is safe for discharge and has been instructed to return immediately for worsening symptoms, change in symptoms or any other concerns.         Final Clinical Impression(s) / ED Diagnoses Final diagnoses:  Chest pain, unspecified type    Rx / DC Orders ED Discharge Orders          Ordered    Ambulatory referral to Cardiology       Comments: If you have not heard from the Cardiology office within the next 72 hours please call [phone].   03/24/22 1556              Linwood Dibbles, MD 03/24/22 1559

## 2022-03-24 NOTE — Discharge Instructions (Addendum)
Test today in the ED were reassuring.  Continue your current medications.  Follow-up with your primary doctor and consider seeing your heart doctor to be rechecked.  Return as needed for worsening symptoms.

## 2022-05-03 ENCOUNTER — Other Ambulatory Visit: Payer: Self-pay | Admitting: Cardiovascular Disease

## 2022-05-18 ENCOUNTER — Other Ambulatory Visit: Payer: Self-pay | Admitting: Cardiovascular Disease

## 2022-06-01 ENCOUNTER — Other Ambulatory Visit: Payer: Self-pay | Admitting: Cardiovascular Disease

## 2022-06-15 ENCOUNTER — Ambulatory Visit: Payer: Medicare HMO | Admitting: Orthopedic Surgery

## 2022-06-22 ENCOUNTER — Other Ambulatory Visit: Payer: Self-pay | Admitting: Cardiovascular Disease

## 2022-07-13 ENCOUNTER — Ambulatory Visit: Payer: Medicare HMO | Admitting: Orthopedic Surgery

## 2022-07-15 ENCOUNTER — Ambulatory Visit: Payer: Medicare HMO | Admitting: Orthopedic Surgery

## 2022-08-12 ENCOUNTER — Ambulatory Visit: Payer: Medicare HMO | Admitting: Orthopedic Surgery

## 2022-08-25 ENCOUNTER — Other Ambulatory Visit: Payer: Self-pay | Admitting: Cardiovascular Disease

## 2022-09-14 ENCOUNTER — Ambulatory Visit: Payer: Medicare HMO | Admitting: Orthopedic Surgery

## 2022-10-12 ENCOUNTER — Ambulatory Visit: Payer: Medicare HMO | Admitting: Orthopedic Surgery

## 2022-11-16 ENCOUNTER — Ambulatory Visit: Payer: Medicare HMO | Admitting: Orthopedic Surgery

## 2022-11-20 ENCOUNTER — Other Ambulatory Visit: Payer: Self-pay | Admitting: Cardiovascular Disease

## 2022-11-22 NOTE — Telephone Encounter (Signed)
This is a [place] pt.  °

## 2022-12-09 ENCOUNTER — Other Ambulatory Visit: Payer: Self-pay | Admitting: Orthopedic Surgery

## 2022-12-21 ENCOUNTER — Other Ambulatory Visit: Payer: Self-pay | Admitting: Cardiovascular Disease

## 2022-12-21 ENCOUNTER — Ambulatory Visit: Payer: Medicare HMO | Admitting: Orthopedic Surgery

## 2023-01-07 ENCOUNTER — Other Ambulatory Visit: Payer: Self-pay | Admitting: Cardiovascular Disease

## 2023-01-12 ENCOUNTER — Telehealth: Payer: Self-pay | Admitting: Cardiovascular Disease

## 2023-01-12 MED ORDER — CLOPIDOGREL BISULFATE 75 MG PO TABS: 75.0000 mg | ORAL_TABLET | Freq: Every day | ORAL | 1 refills | Status: DC

## 2023-01-12 MED ORDER — LOSARTAN POTASSIUM 50 MG PO TABS: 50.0000 mg | ORAL_TABLET | Freq: Every day | ORAL | 1 refills | Status: DC

## 2023-01-12 MED ORDER — ISOSORBIDE MONONITRATE ER 30 MG PO TB24: 30.0000 mg | ORAL_TABLET | Freq: Every day | ORAL | 1 refills | Status: DC

## 2023-01-12 NOTE — Telephone Encounter (Signed)
Medication refill complete

## 2023-01-12 NOTE — Telephone Encounter (Signed)
*  STAT* If patient is at the pharmacy, call can be transferred to refill team.   1. Which medications need to be refilled? (please list name of each medication and dose if known) losartan (COZAAR) 50 MG tablet isosorbide mononitrate (IMDUR) 30 MG 24 hr tablet clopidogrel (PLAVIX) 75 MG tablet  2. Which pharmacy/location (including street and city if local pharmacy) is medication to be sent to? [id number] - [place], [place] - [address]  3. Do they need a 30 day or 90 day supply?   90 day supply

## 2023-01-20 ENCOUNTER — Ambulatory Visit: Payer: Medicare HMO | Admitting: Orthopedic Surgery

## 2023-02-14 NOTE — Progress Notes (Signed)
Cardiology Office Note:  .   Date:  02/28/2023  ID:  Jorge Clements, DOB 03-Jan-1946, MRN [id number] PCP: Benita Stabile, MD  [facility] Providers Cardiologist:  Charlton Haws, MD    History of Present Illness: Marland Kitchen   Jorge Clements is a 77 y.o. male with history of CAD, HTN, HLD, PAD.  Last saw Dr. Eden Emms 11/2021 and stable.  Patient comes in for f/u. He hasn't been out of the house since April. He's weak and has leg problems so just lays in bed to keep his legs elevated. He says his heart is not giving him any trouble. Denies chest pain. Chronic DOE, occasional flutter in his chest.  Taking his meds but not eating well.Gets a lot of take out and processed foods. Lives in a small house attached to his ex wife and son.   ROS:    Studies Reviewed: Marland Kitchen         Prior CV Studies:   Cath 03/2021   Prox RCA lesion is 25% stenosed.   Acute Mrg lesion is 75% stenosed.  Jailed by RCA stent.  TIMI 3 flow.   Prox LAD to Mid LAD lesion is 25% stenosed.   1st Mrg lesion is 25% stenosed.   Previously placed Mid RCA to Dist RCA stent is widely patent.   The left ventricular systolic function is normal.   LV end diastolic pressure is normal.   The left ventricular ejection fraction is 55-65% by visual estimate.   There is no aortic valve stenosis.   Only obstructive disease is in a marginal branch of the RCA that was jailed by the previous stent.  Risk of intervention will be higher than benefit.  Would encourage medical therapy.  No significant disease in the main vessels.  Right PDA is much larger on today's cath than it was at the time of his non-STEMI in 2021.   I suspect his minimally elevated troponin was demand ischemia from his high blood pressure.  Continue aggressive secondary prevention.  Given his history of PAD as well as CAD, ontinuing clopidogrel may be reasonable if he does not have significant bleeding issues.    Risk Assessment/Calculations:             Physical Exam:    VS:  BP 128/62   Pulse 78   Ht 6' (1.829 m)   Wt 299 lb (135.6 kg)   SpO2 97%   BMI 40.55 kg/m    Wt Readings from Last 3 Encounters:  02/28/23 299 lb (135.6 kg)  03/23/22 280 lb (127 kg)  12/03/21 292 lb (132.5 kg)    GEN: Obese, unkempt, in no acute distress NECK: No JVD; No carotid bruits CARDIAC:  RRR, no murmurs, rubs, gallops RESPIRATORY:  Clear to auscultation without rales, wheezing or rhonchi  ABDOMEN: Soft, non-tender, non-distended EXTREMITIES: RLE scaling edema, erythema, nonhealing ulcer  ASSESSMENT AND PLAN: .    CAD STEMI 2021 DES & thrombectomy mid-distal RCA/RVbranch normal LVEF repeat cath 03/2021 75% acute marginal, jailed by RCA stent mid to distal RCA widely patent-no angina. Refill losartan, plavix, Imdur metoprolol,niacin, crestor. Check labs including CBC, CMET, TSH, A1C, lipid panel(had some muffins this am but need to do while he is here)  HTN-controlled  HLD-check labs  PAD R fempop bypass and right transmet amputations followed by Dr. Randie Heinz. With RLE edema, ulcers, pain. Overdue for f/u. Also refer to wound clinic  DM2-hasn't seen PCP recently-check A1C        Dispo:  f/u in 1 yr.  Signed, Jacolyn Reedy, PA-C

## 2023-02-21 ENCOUNTER — Other Ambulatory Visit: Payer: Self-pay | Admitting: Cardiovascular Disease

## 2023-02-24 ENCOUNTER — Ambulatory Visit: Payer: Medicare HMO | Admitting: Orthopedic Surgery

## 2023-02-28 ENCOUNTER — Other Ambulatory Visit (HOSPITAL_COMMUNITY)
Admission: RE | Admit: 2023-02-28 | Discharge: 2023-02-28 | Disposition: A | Discharge status: Home / Self Care | Payer: Medicare HMO | Source: Ambulatory Visit | Attending: Physician Assistant | Admitting: Physician Assistant

## 2023-02-28 ENCOUNTER — Ambulatory Visit: Payer: Medicare HMO | Attending: Physician Assistant | Admitting: Physician Assistant

## 2023-02-28 ENCOUNTER — Encounter: Payer: Self-pay | Admitting: Physician Assistant

## 2023-02-28 VITALS — BP 128/62 | HR 78 | Ht 72.0 in | Wt 299.0 lb

## 2023-02-28 DIAGNOSIS — E785 Hyperlipidemia, unspecified: Secondary | ICD-10-CM | POA: Insufficient documentation

## 2023-02-28 DIAGNOSIS — I10 Essential (primary) hypertension: Secondary | ICD-10-CM

## 2023-02-28 DIAGNOSIS — E119 Type 2 diabetes mellitus without complications: Secondary | ICD-10-CM | POA: Diagnosis not present

## 2023-02-28 DIAGNOSIS — I739 Peripheral vascular disease, unspecified: Secondary | ICD-10-CM

## 2023-02-28 DIAGNOSIS — I2510 Atherosclerotic heart disease of native coronary artery without angina pectoris: Secondary | ICD-10-CM | POA: Diagnosis not present

## 2023-02-28 DIAGNOSIS — E1151 Type 2 diabetes mellitus with diabetic peripheral angiopathy without gangrene: Secondary | ICD-10-CM

## 2023-02-28 LAB — COMPREHENSIVE METABOLIC PANEL
ALT: 14 U/L (ref 0–44)
AST: 17 U/L (ref 15–41)
Albumin: 3.8 g/dL (ref 3.5–5.0)
Alkaline Phosphatase: 62 U/L (ref 38–126)
Anion gap: 10 (ref 5–15)
BUN: 12 mg/dL (ref 8–23)
CO2: 25 mmol/L (ref 22–32)
Calcium: 9.3 mg/dL (ref 8.9–10.3)
Chloride: 96 mmol/L — ABNORMAL LOW (ref 98–111)
Creatinine, Ser: 0.95 mg/dL (ref 0.61–1.24)
GFR, Estimated: >60 mL/min (ref >60)
Glucose, Bld: 126 mg/dL — ABNORMAL HIGH (ref 70–99)
Potassium: 3.8 mmol/L (ref 3.5–5.1)
Sodium: 131 mmol/L — ABNORMAL LOW (ref 135–145)
Total Bilirubin: 0.4 mg/dL (ref <1.2)
Total Protein: 7.9 g/dL (ref 6.5–8.1)

## 2023-02-28 LAB — CBC
HCT: 40 % (ref 39.0–52.0)
Hemoglobin: 13.2 g/dL (ref 13.0–17.0)
MCH: 30.4 pg (ref 26.0–34.0)
MCHC: 33 g/dL (ref 30.0–36.0)
MCV: 92.2 fL (ref 80.0–100.0)
Platelets: 156 10*3/uL (ref 150–400)
RBC: 4.34 MIL/uL (ref 4.22–5.81)
RDW: 14 % (ref 11.5–15.5)
WBC: 9.1 10*3/uL (ref 4.0–10.5)
nRBC: 0 % (ref 0.0–0.2)

## 2023-02-28 LAB — TSH: TSH: 2.71 u[IU]/mL (ref 0.350–4.500)

## 2023-02-28 LAB — HEMOGLOBIN A1C
Hgb A1c MFr Bld: 6.1 % — ABNORMAL HIGH (ref 4.8–5.6)
Mean Plasma Glucose: 128.37 mg/dL

## 2023-02-28 LAB — LIPID PANEL
Cholesterol: 104 mg/dL (ref 0–200)
HDL: 40 mg/dL — ABNORMAL LOW (ref >40)
LDL Cholesterol: 42 mg/dL (ref 0–99)
Total CHOL/HDL Ratio: 2.6 {ratio}
Triglycerides: 108 mg/dL (ref <150)
VLDL: 22 mg/dL (ref 0–40)

## 2023-02-28 NOTE — Patient Instructions (Addendum)
Medication Instructions:  Your physician recommends that you continue on your current medications as directed. Please refer to the Current Medication list given to you today.   Labwork: cbc,cmet,tsh,lipids,A1c, TODAY  Testing/Procedures: None today  Follow-Up: 1 year Dr.Nishan  Any Other Special Instructions Will Be Listed Below (If Applicable).  Follow up with Dr.Cain at [facility], Wednesday ,January 22 at 8:30 am. Expect the visit to last 2 hours   Referred to Physical Therapy for wound care,they will call you to schedule appointment  If you need a refill on your cardiac medications before your next appointment, please call your pharmacy.

## 2023-03-01 ENCOUNTER — Telehealth: Payer: Self-pay

## 2023-03-01 NOTE — Telephone Encounter (Signed)
-----   Message from Jacolyn Reedy sent at 03/01/2023  7:30 AM EST ----- Labs stable, A1C 6.1. needs to f/u with PCP. No changes thanks

## 2023-03-01 NOTE — Telephone Encounter (Signed)
Patient notified and verbalized understanding. Pt had no questions or concerns at this time. PCP copied.  ?

## 2023-03-04 DIAGNOSIS — R4589 Other symptoms and signs involving emotional state: Secondary | ICD-10-CM | POA: Diagnosis not present

## 2023-03-04 DIAGNOSIS — I739 Peripheral vascular disease, unspecified: Secondary | ICD-10-CM | POA: Diagnosis not present

## 2023-03-04 DIAGNOSIS — E785 Hyperlipidemia, unspecified: Secondary | ICD-10-CM | POA: Diagnosis not present

## 2023-03-04 DIAGNOSIS — Z79899 Other long term (current) drug therapy: Secondary | ICD-10-CM | POA: Diagnosis not present

## 2023-03-04 DIAGNOSIS — F419 Anxiety disorder, unspecified: Secondary | ICD-10-CM | POA: Diagnosis not present

## 2023-03-04 DIAGNOSIS — I252 Old myocardial infarction: Secondary | ICD-10-CM | POA: Diagnosis not present

## 2023-03-04 DIAGNOSIS — Z125 Encounter for screening for malignant neoplasm of prostate: Secondary | ICD-10-CM | POA: Diagnosis not present

## 2023-03-04 DIAGNOSIS — I2510 Atherosclerotic heart disease of native coronary artery without angina pectoris: Secondary | ICD-10-CM | POA: Diagnosis not present

## 2023-03-04 DIAGNOSIS — I10 Essential (primary) hypertension: Secondary | ICD-10-CM | POA: Diagnosis not present

## 2023-03-04 DIAGNOSIS — J449 Chronic obstructive pulmonary disease, unspecified: Secondary | ICD-10-CM | POA: Diagnosis not present

## 2023-03-04 DIAGNOSIS — Z23 Encounter for immunization: Secondary | ICD-10-CM | POA: Diagnosis not present

## 2023-03-04 DIAGNOSIS — E1151 Type 2 diabetes mellitus with diabetic peripheral angiopathy without gangrene: Secondary | ICD-10-CM | POA: Diagnosis not present

## 2023-03-04 DIAGNOSIS — H04123 Dry eye syndrome of bilateral lacrimal glands: Secondary | ICD-10-CM | POA: Diagnosis not present

## 2023-04-01 DIAGNOSIS — R4589 Other symptoms and signs involving emotional state: Secondary | ICD-10-CM | POA: Diagnosis not present

## 2023-04-01 DIAGNOSIS — I252 Old myocardial infarction: Secondary | ICD-10-CM | POA: Diagnosis not present

## 2023-04-01 DIAGNOSIS — I739 Peripheral vascular disease, unspecified: Secondary | ICD-10-CM | POA: Diagnosis not present

## 2023-04-01 DIAGNOSIS — F419 Anxiety disorder, unspecified: Secondary | ICD-10-CM | POA: Diagnosis not present

## 2023-04-01 DIAGNOSIS — E1169 Type 2 diabetes mellitus with other specified complication: Secondary | ICD-10-CM | POA: Diagnosis not present

## 2023-04-01 DIAGNOSIS — I10 Essential (primary) hypertension: Secondary | ICD-10-CM | POA: Diagnosis not present

## 2023-04-01 DIAGNOSIS — Z0001 Encounter for general adult medical examination with abnormal findings: Secondary | ICD-10-CM | POA: Diagnosis not present

## 2023-04-01 DIAGNOSIS — J449 Chronic obstructive pulmonary disease, unspecified: Secondary | ICD-10-CM | POA: Diagnosis not present

## 2023-04-01 DIAGNOSIS — E785 Hyperlipidemia, unspecified: Secondary | ICD-10-CM | POA: Diagnosis not present

## 2023-04-01 DIAGNOSIS — I2510 Atherosclerotic heart disease of native coronary artery without angina pectoris: Secondary | ICD-10-CM | POA: Diagnosis not present

## 2023-04-01 DIAGNOSIS — E1159 Type 2 diabetes mellitus with other circulatory complications: Secondary | ICD-10-CM | POA: Diagnosis not present

## 2023-05-11 ENCOUNTER — Ambulatory Visit (HOSPITAL_COMMUNITY): Payer: Medicare HMO

## 2023-05-11 ENCOUNTER — Ambulatory Visit: Payer: Medicare HMO

## 2023-06-01 DIAGNOSIS — H01002 Unspecified blepharitis right lower eyelid: Secondary | ICD-10-CM | POA: Diagnosis not present

## 2023-06-01 DIAGNOSIS — H0289 Other specified disorders of eyelid: Secondary | ICD-10-CM | POA: Diagnosis not present

## 2023-06-01 DIAGNOSIS — H16223 Keratoconjunctivitis sicca, not specified as Sjogren's, bilateral: Secondary | ICD-10-CM | POA: Diagnosis not present

## 2023-06-01 DIAGNOSIS — H01004 Unspecified blepharitis left upper eyelid: Secondary | ICD-10-CM | POA: Diagnosis not present

## 2023-06-01 DIAGNOSIS — H01005 Unspecified blepharitis left lower eyelid: Secondary | ICD-10-CM | POA: Diagnosis not present

## 2023-06-01 DIAGNOSIS — H2513 Age-related nuclear cataract, bilateral: Secondary | ICD-10-CM | POA: Diagnosis not present

## 2023-06-01 DIAGNOSIS — H5202 Hypermetropia, left eye: Secondary | ICD-10-CM | POA: Diagnosis not present

## 2023-06-01 DIAGNOSIS — H01001 Unspecified blepharitis right upper eyelid: Secondary | ICD-10-CM | POA: Diagnosis not present

## 2023-07-04 ENCOUNTER — Other Ambulatory Visit: Payer: Self-pay

## 2023-07-04 DIAGNOSIS — I739 Peripheral vascular disease, unspecified: Secondary | ICD-10-CM

## 2023-07-05 ENCOUNTER — Other Ambulatory Visit: Payer: Self-pay | Admitting: Cardiovascular Disease

## 2023-07-13 ENCOUNTER — Ambulatory Visit: Payer: Medicare HMO

## 2023-07-13 ENCOUNTER — Other Ambulatory Visit (HOSPITAL_COMMUNITY): Payer: Medicare HMO

## 2023-07-13 ENCOUNTER — Encounter (HOSPITAL_COMMUNITY): Payer: Medicare HMO

## 2023-07-14 ENCOUNTER — Other Ambulatory Visit: Payer: Self-pay | Admitting: Cardiovascular Disease

## 2023-07-17 ENCOUNTER — Other Ambulatory Visit: Payer: Self-pay | Admitting: Cardiovascular Disease

## 2023-08-23 ENCOUNTER — Other Ambulatory Visit: Payer: Self-pay

## 2023-08-23 MED ORDER — METOPROLOL TARTRATE 25 MG PO TABS: 25.0000 mg | ORAL_TABLET | Freq: Two times a day (BID) | ORAL | 2 refills | Status: AC

## 2023-08-23 MED ORDER — CLOPIDOGREL BISULFATE 75 MG PO TABS: 75.0000 mg | ORAL_TABLET | Freq: Every day | ORAL | 2 refills | Status: AC

## 2023-08-23 MED ORDER — LOSARTAN POTASSIUM 50 MG PO TABS: 50.0000 mg | ORAL_TABLET | Freq: Every day | ORAL | 2 refills | Status: AC

## 2023-08-23 MED ORDER — ISOSORBIDE MONONITRATE ER 30 MG PO TB24: 30.0000 mg | ORAL_TABLET | Freq: Every day | ORAL | 2 refills | Status: AC

## 2023-08-23 MED ORDER — ROSUVASTATIN CALCIUM 40 MG PO TABS: 40.0000 mg | ORAL_TABLET | Freq: Every day | ORAL | 2 refills | Status: AC

## 2023-08-24 DIAGNOSIS — E785 Hyperlipidemia, unspecified: Secondary | ICD-10-CM | POA: Diagnosis not present

## 2023-08-24 DIAGNOSIS — E1169 Type 2 diabetes mellitus with other specified complication: Secondary | ICD-10-CM | POA: Diagnosis not present

## 2023-08-30 DIAGNOSIS — F419 Anxiety disorder, unspecified: Secondary | ICD-10-CM | POA: Diagnosis not present

## 2023-08-30 DIAGNOSIS — I10 Essential (primary) hypertension: Secondary | ICD-10-CM | POA: Diagnosis not present

## 2023-08-30 DIAGNOSIS — I2510 Atherosclerotic heart disease of native coronary artery without angina pectoris: Secondary | ICD-10-CM | POA: Diagnosis not present

## 2023-08-30 DIAGNOSIS — Z5320 Procedure and treatment not carried out because of patient's decision for unspecified reasons: Secondary | ICD-10-CM | POA: Diagnosis not present

## 2023-08-30 DIAGNOSIS — I252 Old myocardial infarction: Secondary | ICD-10-CM | POA: Diagnosis not present

## 2023-08-30 DIAGNOSIS — E1169 Type 2 diabetes mellitus with other specified complication: Secondary | ICD-10-CM | POA: Diagnosis not present

## 2023-08-30 DIAGNOSIS — J449 Chronic obstructive pulmonary disease, unspecified: Secondary | ICD-10-CM | POA: Diagnosis not present

## 2023-08-30 DIAGNOSIS — R4589 Other symptoms and signs involving emotional state: Secondary | ICD-10-CM | POA: Diagnosis not present

## 2023-08-30 DIAGNOSIS — I739 Peripheral vascular disease, unspecified: Secondary | ICD-10-CM | POA: Diagnosis not present

## 2023-08-30 DIAGNOSIS — E785 Hyperlipidemia, unspecified: Secondary | ICD-10-CM | POA: Diagnosis not present

## 2023-08-30 DIAGNOSIS — E871 Hypo-osmolality and hyponatremia: Secondary | ICD-10-CM | POA: Diagnosis not present

## 2023-08-30 DIAGNOSIS — Z23 Encounter for immunization: Secondary | ICD-10-CM | POA: Diagnosis not present

## 2023-09-01 ENCOUNTER — Ambulatory Visit: Payer: Self-pay | Admitting: Cardiovascular Disease

## 2023-09-28 ENCOUNTER — Encounter (HOSPITAL_COMMUNITY)

## 2023-09-28 ENCOUNTER — Ambulatory Visit

## 2023-09-28 ENCOUNTER — Other Ambulatory Visit (HOSPITAL_COMMUNITY)

## 2023-11-18 ENCOUNTER — Other Ambulatory Visit: Payer: Self-pay | Admitting: Vascular Surgery

## 2023-11-18 DIAGNOSIS — I739 Peripheral vascular disease, unspecified: Secondary | ICD-10-CM

## 2023-11-18 DIAGNOSIS — Z9889 Other specified postprocedural states: Secondary | ICD-10-CM

## 2023-11-30 DIAGNOSIS — H2513 Age-related nuclear cataract, bilateral: Secondary | ICD-10-CM | POA: Diagnosis not present

## 2023-11-30 DIAGNOSIS — H43811 Vitreous degeneration, right eye: Secondary | ICD-10-CM | POA: Diagnosis not present

## 2023-11-30 DIAGNOSIS — H01002 Unspecified blepharitis right lower eyelid: Secondary | ICD-10-CM | POA: Diagnosis not present

## 2023-11-30 DIAGNOSIS — H5202 Hypermetropia, left eye: Secondary | ICD-10-CM | POA: Diagnosis not present

## 2023-11-30 DIAGNOSIS — H0289 Other specified disorders of eyelid: Secondary | ICD-10-CM | POA: Diagnosis not present

## 2023-11-30 DIAGNOSIS — H01004 Unspecified blepharitis left upper eyelid: Secondary | ICD-10-CM | POA: Diagnosis not present

## 2023-11-30 DIAGNOSIS — H01001 Unspecified blepharitis right upper eyelid: Secondary | ICD-10-CM | POA: Diagnosis not present

## 2023-11-30 DIAGNOSIS — H16223 Keratoconjunctivitis sicca, not specified as Sjogren's, bilateral: Secondary | ICD-10-CM | POA: Diagnosis not present

## 2023-11-30 DIAGNOSIS — H01005 Unspecified blepharitis left lower eyelid: Secondary | ICD-10-CM | POA: Diagnosis not present

## 2023-12-14 ENCOUNTER — Ambulatory Visit (HOSPITAL_BASED_OUTPATIENT_CLINIC_OR_DEPARTMENT_OTHER)
Admission: RE | Admit: 2023-12-14 | Discharge: 2023-12-14 | Discharge status: Home / Self Care | Source: Ambulatory Visit | Attending: Vascular Surgery

## 2023-12-14 ENCOUNTER — Other Ambulatory Visit: Payer: Self-pay

## 2023-12-14 ENCOUNTER — Ambulatory Visit (HOSPITAL_COMMUNITY)
Admission: RE | Admit: 2023-12-14 | Discharge: 2023-12-14 | Disposition: A | Discharge status: Home / Self Care | Source: Ambulatory Visit | Attending: Vascular Surgery | Admitting: Vascular Surgery

## 2023-12-14 ENCOUNTER — Ambulatory Visit: Attending: Vascular Surgery | Admitting: Physician Assistant

## 2023-12-14 VITALS — BP 137/67 | HR 68 | Temp 98.0°F | Wt 246.5 lb

## 2023-12-14 DIAGNOSIS — Z9889 Other specified postprocedural states: Secondary | ICD-10-CM

## 2023-12-14 DIAGNOSIS — I70735 Atherosclerosis of other type of bypass graft(s) of the right leg with ulceration of other part of foot: Secondary | ICD-10-CM

## 2023-12-14 DIAGNOSIS — L97519 Non-pressure chronic ulcer of other part of right foot with unspecified severity: Secondary | ICD-10-CM | POA: Diagnosis not present

## 2023-12-14 DIAGNOSIS — I739 Peripheral vascular disease, unspecified: Secondary | ICD-10-CM

## 2023-12-14 LAB — VAS US ABI WITH/WO TBI
Left ABI: 0.61
Right ABI: 0.63

## 2023-12-14 NOTE — Progress Notes (Signed)
 HISTORY AND PHYSICAL     CC:  follow up. Requesting Provider:  Shona Norleen PEDLAR, MD  HPI: This is a 78 y.o. male who is here today for follow up for PAD.  Pt has hx of right femoral to above knee bypass with vein on 08/18/2017 by Dr. Sheree.  He subsequently underwent a TMA on the right by Dr. Harden 08/21/2017.   Pt was last seen 12/02/2021 and at that time, he was still following with Dr. Harden for chronic right foot ulcers and skin breakdown.  He did not have any rest pain or claudication or any new ulcers.   The pt returns today for follow up.  He states he hasn't been able to make his appts due to some family issues and car issues.  He denies any pain in his feet.  He continues to have a plantar ulcer on the right foot.  He does not have pain in his feet.  He walks with a cane but does not have claudication.   He states that he will walk outside with his socks on.  He feels he has some neuropathy in his feet with tingling feeling.    He states he continues to wear Dr. Crist sock for leg swelling and this is much improved since surgery.   The pt is on a statin for cholesterol management.    The pt is not on an aspirin .    Other AC:  Plavix  The pt is on ARB for hypertension.  The pt is not on diabetic medication. Tobacco hx:  former  Pt does not know his family hx as he is adopted.   Past Medical History:  Diagnosis Date   Abdominal aortic aneurysm    no AAA on US  in 2020   CAD (coronary artery disease)    Stent to RCA 2021   Chronic obstructive pulmonary disease    HLD (hyperlipidemia)    HTN (hypertension)    Peripheral artery disease    s/p R fem pop // s/p R transmet amp    Past Surgical History:  Procedure Laterality Date   ABDOMINAL AORTOGRAM N/A 08/17/2017   Procedure: ABDOMINAL AORTOGRAM;  Surgeon: Sheree Penne Bruckner, MD;  Location: [facility] INVASIVE CV LAB;  Service: Cardiovascular;  Laterality: N/A;   ABDOMINAL AORTOGRAM W/LOWER EXTREMITY Right 07/13/2021   Procedure:  ABDOMINAL AORTOGRAM W/LOWER EXTREMITY;  Surgeon: Sheree Penne Bruckner, MD;  Location: [facility] INVASIVE CV LAB;  Service: Cardiovascular;  Laterality: Right;   AMPUTATION Right 08/21/2017   Procedure: TRANSMETATARSAL AMPUTATION RIGHT FOOT;  Surgeon: Harden Jerona GAILS, MD;  Location: [facility] OR;  Service: Orthopedics;  Laterality: Right;   AMPUTATION TOE Right 08/18/2017   Procedure: AMPUTATION RIGHT SECOND AND THIRD TOES;  Surgeon: Sheree Penne Bruckner, MD;  Location: [facility] OR;  Service: Vascular;  Laterality: Right;   CORONARY BALLOON ANGIOPLASTY N/A 12/03/2019   Procedure: CORONARY BALLOON ANGIOPLASTY;  Surgeon: Dann Candyce RAMAN, MD;  Location: [facility] INVASIVE CV LAB;  Service: Cardiovascular;  Laterality: N/A;   CORONARY STENT INTERVENTION N/A 12/03/2019   Procedure: CORONARY STENT INTERVENTION;  Surgeon: Dann Candyce RAMAN, MD;  Location: [facility] INVASIVE CV LAB;  Service: Cardiovascular;  Laterality: N/A;   ENDARTERECTOMY FEMORAL Right 08/18/2017   Procedure: ENDARTERECTOMY RIGHT SUPERFICIAL SCHERYL;  Surgeon: Sheree Penne Bruckner, MD;  Location: [facility] OR;  Service: Vascular;  Laterality: Right;   FEMORAL-POPLITEAL BYPASS GRAFT Right 08/18/2017   Procedure: BYPASS GRAFT RIGHT COMMON FEMORAL TO ABOVE KNEE POPLITEAL ARTERY USING RIGHT REVERSED GREAT SAPHENOUS VEIN;  Surgeon: Sheree Penne Bruckner, MD;  Location: [facility] OR;  Service: Vascular;  Laterality: Right;   LEFT HEART CATH AND CORONARY ANGIOGRAPHY N/A 12/03/2019   Procedure: LEFT HEART CATH AND CORONARY ANGIOGRAPHY;  Surgeon: Dann Candyce RAMAN, MD;  Location: [facility] INVASIVE CV LAB;  Service: Cardiovascular;  Laterality: N/A;   LEFT HEART CATH AND CORONARY ANGIOGRAPHY N/A 04/14/2021   Procedure: LEFT HEART CATH AND CORONARY ANGIOGRAPHY;  Surgeon: Dann Candyce RAMAN, MD;  Location: [facility] INVASIVE CV LAB;  Service: Cardiovascular;  Laterality: N/A;   LOWER EXTREMITY ANGIOGRAPHY Right 08/17/2017   Procedure: Lower Extremity Angiography;  Surgeon: Sheree Penne Bruckner, MD;  Location: [facility] INVASIVE CV LAB;  Service: Cardiovascular;  Laterality: Right;   PERIPHERAL VASCULAR BALLOON ANGIOPLASTY Right 08/17/2017   Procedure: PERIPHERAL VASCULAR BALLOON ANGIOPLASTY;  Surgeon: Sheree Penne Bruckner, MD;  Location: [facility] INVASIVE CV LAB;  Service: Cardiovascular;  Laterality: Right;  SFA UNABLE TO CROSS   VEIN HARVEST Right 08/18/2017   Procedure: VEIN HARVEST RIGHT GREAT SAPHENOUS;  Surgeon: Sheree Penne Bruckner, MD;  Location: [facility] OR;  Service: Vascular;  Laterality: Right;   WOUND DEBRIDEMENT Right 08/18/2017   Procedure: DEBRIDEMENT WOUND RIGHT FOOT;  Surgeon: Sheree Penne Bruckner, MD;  Location: [facility] OR;  Service: Vascular;  Laterality: Right;    Allergies  Allergen Reactions   Penicillins Hives and Rash    Childhood reaction Has patient had a PCN reaction causing immediate rash, facial/tongue/throat swelling, SOB or lightheadedness with hypotension: No PT DEVELOPED ## SEVERE ## RASH INVOLVING MUCUS MEMBRANES or SKIN NECROSIS: #  #  YES  #  # Has patient had a PCN reaction that required hospitalization: Unknown Has patient had a PCN reaction occurring within the last 10 years: No   Clindamycin  Rash    Diffuse rash across body    Current Outpatient Medications  Medication Sig Dispense Refill   BREZTRI AEROSPHERE 160-9-4.8 MCG/ACT AERO Inhale 2 puffs into the lungs 2 (two) times daily.     clopidogrel  (PLAVIX ) 75 MG tablet Take 1 tablet (75 mg total) by mouth daily. 90 tablet 2   doxycycline  (VIBRA -TABS) 100 MG tablet Take 1 tablet (100 mg total) by mouth 2 (two) times daily. 60 tablet 0   isosorbide  mononitrate (IMDUR ) 30 MG 24 hr tablet Take 1 tablet (30 mg total) by mouth daily. 90 tablet 2   losartan  (COZAAR ) 50 MG tablet Take 1 tablet (50 mg total) by mouth daily. 90 tablet 2   metoprolol  tartrate (LOPRESSOR ) 25 MG tablet Take 1 tablet (25 mg total) by mouth 2 (two) times daily. 180 tablet 2   Multiple Vitamins-Minerals (CENTRUM  SILVER 50+MEN PO) Take 1 tablet by mouth daily.     niacin 500 MG tablet Take 500 mg by mouth daily.     nitroGLYCERIN  (NITROSTAT ) 0.4 MG SL tablet Place 0.4 mg under the tongue every 5 (five) minutes as needed for chest pain.     rosuvastatin  (CRESTOR ) 40 MG tablet Take 1 tablet (40 mg total) by mouth daily. 90 tablet 2   sulfamethoxazole -trimethoprim  (BACTRIM ) 400-80 MG tablet Take 1 tablet by mouth 2 (two) times daily. 20 tablet 0   Vitamin D-Vitamin K (VITAMIN K2-VITAMIN D3 PO) Take 1 tablet by mouth daily.     vitamin E 180 MG (400 UNITS) capsule Take 400 Units by mouth daily.     No current facility-administered medications for this visit.    Family History  Adopted: Yes    Social History   Socioeconomic History   Marital  status: Divorced    Spouse name: Not on file   Number of children: Not on file   Years of education: Not on file   Highest education level: Not on file  Occupational History   Not on file  Tobacco Use   Smoking status: Former    Passive exposure: Never   Smokeless tobacco: Never  Vaping Use   Vaping status: Former  Substance and Sexual Activity   Alcohol use: Not Currently   Drug use: No   Sexual activity: Not on file  Other Topics Concern   Not on file  Social History Narrative   Not on file   Social Drivers of Health   Financial Resource Strain: Not on file  Food Insecurity: Not on file  Transportation Needs: Not on file  Physical Activity: Not on file  Stress: Not on file  Social Connections: Not on file  Intimate Partner Violence: Not on file     REVIEW OF SYSTEMS:   [X]  denotes positive finding, [ ]  denotes negative finding Cardiac  Comments:  Chest pain or chest pressure:    Shortness of breath upon exertion:    Short of breath when lying flat:    Irregular heart rhythm:        Vascular    Pain in calf, thigh, or hip brought on by ambulation:    Pain in feet at night that wakes you up from your sleep:     Blood clot in your  veins:    Leg swelling:  x       Pulmonary    Oxygen at home:    Productive cough:     Wheezing:         Neurologic    Sudden weakness in arms or legs:     Sudden numbness in arms or legs:     Sudden onset of difficulty speaking or slurred speech:    Temporary loss of vision in one eye:     Problems with dizziness:         Gastrointestinal    Blood in stool:     Vomited blood:         Genitourinary    Burning when urinating:     Blood in urine:        Psychiatric    Major depression:         Hematologic    Bleeding problems:    Problems with blood clotting too easily:        Skin    Rashes or ulcers: x       Constitutional    Fever or chills:      PHYSICAL EXAMINATION:  Today's Vitals   12/14/23 1447  BP: 137/67  Pulse: 68  Temp: 98 F (36.7 C)  TempSrc: Temporal  Weight: 246 lb 8 oz (111.8 kg)  PainSc: 0-No pain   Body mass index is 33.43 kg/m.   General:  WDWN in NAD; vital signs documented above Gait: Not observed HENT: WNL, normocephalic Pulmonary: normal non-labored breathing , without wheezing Cardiac: regular HR, without carotid bruits Abdomen: soft Skin: without rashes Vascular Exam/Pulses:  Right Left  Radial 2+ (normal) 2+ (normal)  Femoral 2+ (normal) 2+ (normal)  DP monophasic monophasic  PT monophasic monophasic  Peroneal Brisk monophasic Not examined   Extremities: plantar ulceration as pictured     Musculoskeletal: no muscle wasting or atrophy  Neurologic: A&O X 3 Psychiatric:  The pt has Normal affect.   Non-Invasive Vascular Imaging:  ABI's/TBI's on 12/14/2022: Right:  0.63/amp - Great toe pressure: amp Left:  0.61/0.32 - Great toe pressure: 49  Arterial duplex on 12/14/2023: +----------+--------+-----+---------------+----------+---------------------  RIGHT    PSV cm/sRatioStenosis       Waveform  Comments              +----------+--------+-----+---------------+----------+---------------------  CFA Prox   259          50-74% stenosis           prominent at 2.0 cm with no obvious plaque      +----------+--------+-----+---------------+----------+---------------------  POP Mid   120                         monophasic                       +----------+--------+-----+---------------+----------+---------------------  POP Distal233          50-74% stenosismonophasic                      +----------+--------+-----+---------------+----------+---------------------  TP Trunk  177          30-49% stenosismonophasic                      +----------+--------+-----+---------------+----------+---------------------     Right Graft #1: CFA to AK Popliteal Artery  +------------------+--------+---------------+-----------+------------------                   PSV cm/sStenosis       Waveform   Comments           +------------------+--------+---------------+-----------+------------------  Inflow           330     50-74% stenosismultiphasic prominent at 2.0 cm with no obvious plaque  +------------------+--------+---------------+-----------+------------------  Prox Anastomosis  233                    multiphasic                   +------------------+--------+---------------+-----------+------------------  Proximal Graft    61                     multiphasic                   +------------------+--------+---------------+-----------+------------------  Mid Graft         48                     multiphasic                   +------------------+--------+---------------+-----------+------------------  Distal Graft      48                     multiphasic                        +------------------+--------+---------------+-----------+------------------  Distal Anastomosis112                    monophasic                    +------------------+--------+---------------+-----------+------------------  Outflow          80                     multiphasic                    +------------------+--------+---------------+-----------+------------------  Summary:  Right: No significant change compared to previous study.   Patent common femoral artery to above-the-knee popliteal artery bypass graft with velocities consistent with 50-99% stenosis in the CFA without obvious plaque.  50-74% stenosis in the mid/distal popliteal artery.  30-49% stenosis in the distal TPT.   Previous ABI's/TBI's on 12/02/2021: Right:  0.74/amp - Great toe pressure: amp Left:  0.68/0.37 - Great toe pressure:  64  Previous arterial duplex on 12/02/2021: Summary:  Right: Patent bypass graft with inflow velocities suggesting a 50-74% stenosis with disease not well appreciated.  Mid popliteal artery disease observed (50-74%). Low end of range.     ASSESSMENT/PLAN:: 78 y.o. male here for follow up for PAD with hx of right femoral to above knee bypass with vein on 08/18/2017 by Dr. Sheree.  He subsequently underwent a TMA on the right by Dr. Harden 08/21/2017.    -pt continues to have right plantar ulceration.  Right ABI is 0.63 and he has elevated inflow velocities and decreased velocities in the mid and distal bypass, which may be indication of threatened bypass.  Pt seen with Dr. Sheree and will plan referral back to Dr. Harden and plan for angiogram to look at RLE bypass graft to try to improve blood flow to right foot to improve his chances of not having amputation. -discussed importance of not walking outside barefooted to prevent any new ulcerations.     -continue plavix chrystie    Lucie Apt, [facility] [phone]  Clinic MD:   Sheree

## 2023-12-14 NOTE — H&P (View-Only) (Signed)
 HISTORY AND PHYSICAL     CC:  follow up. Requesting Provider:  Shona Norleen PEDLAR, MD  HPI: This is a 78 y.o. male who is here today for follow up for PAD.  Pt has hx of right femoral to above knee bypass with vein on 08/18/2017 by Dr. Sheree.  He subsequently underwent a TMA on the right by Dr. Harden 08/21/2017.   Pt was last seen 12/02/2021 and at that time, he was still following with Dr. Harden for chronic right foot ulcers and skin breakdown.  He did not have any rest pain or claudication or any new ulcers.   The pt returns today for follow up.  He states he hasn't been able to make his appts due to some family issues and car issues.  He denies any pain in his feet.  He continues to have a plantar ulcer on the right foot.  He does not have pain in his feet.  He walks with a cane but does not have claudication.   He states that he will walk outside with his socks on.  He feels he has some neuropathy in his feet with tingling feeling.    He states he continues to wear Dr. Crist sock for leg swelling and this is much improved since surgery.   The pt is on a statin for cholesterol management.    The pt is not on an aspirin .    Other AC:  Plavix  The pt is on ARB for hypertension.  The pt is not on diabetic medication. Tobacco hx:  former  Pt does not know his family hx as he is adopted.   Past Medical History:  Diagnosis Date   Abdominal aortic aneurysm    no AAA on US  in 2020   CAD (coronary artery disease)    Stent to RCA 2021   Chronic obstructive pulmonary disease    HLD (hyperlipidemia)    HTN (hypertension)    Peripheral artery disease    s/p R fem pop // s/p R transmet amp    Past Surgical History:  Procedure Laterality Date   ABDOMINAL AORTOGRAM N/A 08/17/2017   Procedure: ABDOMINAL AORTOGRAM;  Surgeon: Sheree Penne Bruckner, MD;  Location: [facility] INVASIVE CV LAB;  Service: Cardiovascular;  Laterality: N/A;   ABDOMINAL AORTOGRAM W/LOWER EXTREMITY Right 07/13/2021   Procedure:  ABDOMINAL AORTOGRAM W/LOWER EXTREMITY;  Surgeon: Sheree Penne Bruckner, MD;  Location: [facility] INVASIVE CV LAB;  Service: Cardiovascular;  Laterality: Right;   AMPUTATION Right 08/21/2017   Procedure: TRANSMETATARSAL AMPUTATION RIGHT FOOT;  Surgeon: Harden Jerona GAILS, MD;  Location: [facility] OR;  Service: Orthopedics;  Laterality: Right;   AMPUTATION TOE Right 08/18/2017   Procedure: AMPUTATION RIGHT SECOND AND THIRD TOES;  Surgeon: Sheree Penne Bruckner, MD;  Location: [facility] OR;  Service: Vascular;  Laterality: Right;   CORONARY BALLOON ANGIOPLASTY N/A 12/03/2019   Procedure: CORONARY BALLOON ANGIOPLASTY;  Surgeon: Dann Candyce RAMAN, MD;  Location: [facility] INVASIVE CV LAB;  Service: Cardiovascular;  Laterality: N/A;   CORONARY STENT INTERVENTION N/A 12/03/2019   Procedure: CORONARY STENT INTERVENTION;  Surgeon: Dann Candyce RAMAN, MD;  Location: [facility] INVASIVE CV LAB;  Service: Cardiovascular;  Laterality: N/A;   ENDARTERECTOMY FEMORAL Right 08/18/2017   Procedure: ENDARTERECTOMY RIGHT SUPERFICIAL SCHERYL;  Surgeon: Sheree Penne Bruckner, MD;  Location: [facility] OR;  Service: Vascular;  Laterality: Right;   FEMORAL-POPLITEAL BYPASS GRAFT Right 08/18/2017   Procedure: BYPASS GRAFT RIGHT COMMON FEMORAL TO ABOVE KNEE POPLITEAL ARTERY USING RIGHT REVERSED GREAT SAPHENOUS VEIN;  Surgeon: Sheree Penne Bruckner, MD;  Location: [facility] OR;  Service: Vascular;  Laterality: Right;   LEFT HEART CATH AND CORONARY ANGIOGRAPHY N/A 12/03/2019   Procedure: LEFT HEART CATH AND CORONARY ANGIOGRAPHY;  Surgeon: Dann Candyce RAMAN, MD;  Location: [facility] INVASIVE CV LAB;  Service: Cardiovascular;  Laterality: N/A;   LEFT HEART CATH AND CORONARY ANGIOGRAPHY N/A 04/14/2021   Procedure: LEFT HEART CATH AND CORONARY ANGIOGRAPHY;  Surgeon: Dann Candyce RAMAN, MD;  Location: [facility] INVASIVE CV LAB;  Service: Cardiovascular;  Laterality: N/A;   LOWER EXTREMITY ANGIOGRAPHY Right 08/17/2017   Procedure: Lower Extremity Angiography;  Surgeon: Sheree Penne Bruckner, MD;  Location: [facility] INVASIVE CV LAB;  Service: Cardiovascular;  Laterality: Right;   PERIPHERAL VASCULAR BALLOON ANGIOPLASTY Right 08/17/2017   Procedure: PERIPHERAL VASCULAR BALLOON ANGIOPLASTY;  Surgeon: Sheree Penne Bruckner, MD;  Location: [facility] INVASIVE CV LAB;  Service: Cardiovascular;  Laterality: Right;  SFA UNABLE TO CROSS   VEIN HARVEST Right 08/18/2017   Procedure: VEIN HARVEST RIGHT GREAT SAPHENOUS;  Surgeon: Sheree Penne Bruckner, MD;  Location: [facility] OR;  Service: Vascular;  Laterality: Right;   WOUND DEBRIDEMENT Right 08/18/2017   Procedure: DEBRIDEMENT WOUND RIGHT FOOT;  Surgeon: Sheree Penne Bruckner, MD;  Location: [facility] OR;  Service: Vascular;  Laterality: Right;    Allergies  Allergen Reactions   Penicillins Hives and Rash    Childhood reaction Has patient had a PCN reaction causing immediate rash, facial/tongue/throat swelling, SOB or lightheadedness with hypotension: No PT DEVELOPED ## SEVERE ## RASH INVOLVING MUCUS MEMBRANES or SKIN NECROSIS: #  #  YES  #  # Has patient had a PCN reaction that required hospitalization: Unknown Has patient had a PCN reaction occurring within the last 10 years: No   Clindamycin  Rash    Diffuse rash across body    Current Outpatient Medications  Medication Sig Dispense Refill   BREZTRI AEROSPHERE 160-9-4.8 MCG/ACT AERO Inhale 2 puffs into the lungs 2 (two) times daily.     clopidogrel  (PLAVIX ) 75 MG tablet Take 1 tablet (75 mg total) by mouth daily. 90 tablet 2   doxycycline  (VIBRA -TABS) 100 MG tablet Take 1 tablet (100 mg total) by mouth 2 (two) times daily. 60 tablet 0   isosorbide  mononitrate (IMDUR ) 30 MG 24 hr tablet Take 1 tablet (30 mg total) by mouth daily. 90 tablet 2   losartan  (COZAAR ) 50 MG tablet Take 1 tablet (50 mg total) by mouth daily. 90 tablet 2   metoprolol  tartrate (LOPRESSOR ) 25 MG tablet Take 1 tablet (25 mg total) by mouth 2 (two) times daily. 180 tablet 2   Multiple Vitamins-Minerals (CENTRUM  SILVER 50+MEN PO) Take 1 tablet by mouth daily.     niacin 500 MG tablet Take 500 mg by mouth daily.     nitroGLYCERIN  (NITROSTAT ) 0.4 MG SL tablet Place 0.4 mg under the tongue every 5 (five) minutes as needed for chest pain.     rosuvastatin  (CRESTOR ) 40 MG tablet Take 1 tablet (40 mg total) by mouth daily. 90 tablet 2   sulfamethoxazole -trimethoprim  (BACTRIM ) 400-80 MG tablet Take 1 tablet by mouth 2 (two) times daily. 20 tablet 0   Vitamin D-Vitamin K (VITAMIN K2-VITAMIN D3 PO) Take 1 tablet by mouth daily.     vitamin E 180 MG (400 UNITS) capsule Take 400 Units by mouth daily.     No current facility-administered medications for this visit.    Family History  Adopted: Yes    Social History   Socioeconomic History   Marital  status: Divorced    Spouse name: Not on file   Number of children: Not on file   Years of education: Not on file   Highest education level: Not on file  Occupational History   Not on file  Tobacco Use   Smoking status: Former    Passive exposure: Never   Smokeless tobacco: Never  Vaping Use   Vaping status: Former  Substance and Sexual Activity   Alcohol use: Not Currently   Drug use: No   Sexual activity: Not on file  Other Topics Concern   Not on file  Social History Narrative   Not on file   Social Drivers of Health   Financial Resource Strain: Not on file  Food Insecurity: Not on file  Transportation Needs: Not on file  Physical Activity: Not on file  Stress: Not on file  Social Connections: Not on file  Intimate Partner Violence: Not on file     REVIEW OF SYSTEMS:   [X]  denotes positive finding, [ ]  denotes negative finding Cardiac  Comments:  Chest pain or chest pressure:    Shortness of breath upon exertion:    Short of breath when lying flat:    Irregular heart rhythm:        Vascular    Pain in calf, thigh, or hip brought on by ambulation:    Pain in feet at night that wakes you up from your sleep:     Blood clot in your  veins:    Leg swelling:  x       Pulmonary    Oxygen at home:    Productive cough:     Wheezing:         Neurologic    Sudden weakness in arms or legs:     Sudden numbness in arms or legs:     Sudden onset of difficulty speaking or slurred speech:    Temporary loss of vision in one eye:     Problems with dizziness:         Gastrointestinal    Blood in stool:     Vomited blood:         Genitourinary    Burning when urinating:     Blood in urine:        Psychiatric    Major depression:         Hematologic    Bleeding problems:    Problems with blood clotting too easily:        Skin    Rashes or ulcers: x       Constitutional    Fever or chills:      PHYSICAL EXAMINATION:  Today's Vitals   12/14/23 1447  BP: 137/67  Pulse: 68  Temp: 98 F (36.7 C)  TempSrc: Temporal  Weight: 246 lb 8 oz (111.8 kg)  PainSc: 0-No pain   Body mass index is 33.43 kg/m.   General:  WDWN in NAD; vital signs documented above Gait: Not observed HENT: WNL, normocephalic Pulmonary: normal non-labored breathing , without wheezing Cardiac: regular HR, without carotid bruits Abdomen: soft Skin: without rashes Vascular Exam/Pulses:  Right Left  Radial 2+ (normal) 2+ (normal)  Femoral 2+ (normal) 2+ (normal)  DP monophasic monophasic  PT monophasic monophasic  Peroneal Brisk monophasic Not examined   Extremities: plantar ulceration as pictured     Musculoskeletal: no muscle wasting or atrophy  Neurologic: A&O X 3 Psychiatric:  The pt has Normal affect.   Non-Invasive Vascular Imaging:  ABI's/TBI's on 12/14/2022: Right:  0.63/amp - Great toe pressure: amp Left:  0.61/0.32 - Great toe pressure: 49  Arterial duplex on 12/14/2023: +----------+--------+-----+---------------+----------+---------------------  RIGHT    PSV cm/sRatioStenosis       Waveform  Comments              +----------+--------+-----+---------------+----------+---------------------  CFA Prox   259          50-74% stenosis           prominent at 2.0 cm with no obvious plaque      +----------+--------+-----+---------------+----------+---------------------  POP Mid   120                         monophasic                       +----------+--------+-----+---------------+----------+---------------------  POP Distal233          50-74% stenosismonophasic                      +----------+--------+-----+---------------+----------+---------------------  TP Trunk  177          30-49% stenosismonophasic                      +----------+--------+-----+---------------+----------+---------------------     Right Graft #1: CFA to AK Popliteal Artery  +------------------+--------+---------------+-----------+------------------                   PSV cm/sStenosis       Waveform   Comments           +------------------+--------+---------------+-----------+------------------  Inflow           330     50-74% stenosismultiphasic prominent at 2.0 cm with no obvious plaque  +------------------+--------+---------------+-----------+------------------  Prox Anastomosis  233                    multiphasic                   +------------------+--------+---------------+-----------+------------------  Proximal Graft    61                     multiphasic                   +------------------+--------+---------------+-----------+------------------  Mid Graft         48                     multiphasic                   +------------------+--------+---------------+-----------+------------------  Distal Graft      48                     multiphasic                        +------------------+--------+---------------+-----------+------------------  Distal Anastomosis112                    monophasic                    +------------------+--------+---------------+-----------+------------------  Outflow          80                     multiphasic                    +------------------+--------+---------------+-----------+------------------  Summary:  Right: No significant change compared to previous study.   Patent common femoral artery to above-the-knee popliteal artery bypass graft with velocities consistent with 50-99% stenosis in the CFA without obvious plaque.  50-74% stenosis in the mid/distal popliteal artery.  30-49% stenosis in the distal TPT.   Previous ABI's/TBI's on 12/02/2021: Right:  0.74/amp - Great toe pressure: amp Left:  0.68/0.37 - Great toe pressure:  64  Previous arterial duplex on 12/02/2021: Summary:  Right: Patent bypass graft with inflow velocities suggesting a 50-74% stenosis with disease not well appreciated.  Mid popliteal artery disease observed (50-74%). Low end of range.     ASSESSMENT/PLAN:: 78 y.o. male here for follow up for PAD with hx of right femoral to above knee bypass with vein on 08/18/2017 by Dr. Sheree.  He subsequently underwent a TMA on the right by Dr. Harden 08/21/2017.    -pt continues to have right plantar ulceration.  Right ABI is 0.63 and he has elevated inflow velocities and decreased velocities in the mid and distal bypass, which may be indication of threatened bypass.  Pt seen with Dr. Sheree and will plan referral back to Dr. Harden and plan for angiogram to look at RLE bypass graft to try to improve blood flow to right foot to improve his chances of not having amputation. -discussed importance of not walking outside barefooted to prevent any new ulcerations.     -continue plavix chrystie    Lucie Apt, [facility] [phone]  Clinic MD:   Sheree

## 2023-12-26 ENCOUNTER — Other Ambulatory Visit: Payer: Self-pay

## 2023-12-26 ENCOUNTER — Ambulatory Visit (HOSPITAL_COMMUNITY)
Admission: RE | Admit: 2023-12-26 | Discharge: 2023-12-26 | Disposition: A | Discharge status: Home / Self Care | Attending: Vascular Surgery | Admitting: Vascular Surgery

## 2023-12-26 ENCOUNTER — Encounter (HOSPITAL_COMMUNITY)
Admission: RE | Disposition: A | Discharge status: Home / Self Care | Payer: Self-pay | Source: Home / Self Care | Attending: Vascular Surgery

## 2023-12-26 DIAGNOSIS — I70735 Atherosclerosis of other type of bypass graft(s) of the right leg with ulceration of other part of foot: Secondary | ICD-10-CM

## 2023-12-26 DIAGNOSIS — Z89431 Acquired absence of right foot: Secondary | ICD-10-CM | POA: Diagnosis not present

## 2023-12-26 DIAGNOSIS — Z7902 Long term (current) use of antithrombotics/antiplatelets: Secondary | ICD-10-CM | POA: Insufficient documentation

## 2023-12-26 DIAGNOSIS — T82590A Other mechanical complication of surgically created arteriovenous fistula, initial encounter: Secondary | ICD-10-CM | POA: Diagnosis not present

## 2023-12-26 DIAGNOSIS — I10 Essential (primary) hypertension: Secondary | ICD-10-CM | POA: Diagnosis not present

## 2023-12-26 DIAGNOSIS — Z79899 Other long term (current) drug therapy: Secondary | ICD-10-CM | POA: Insufficient documentation

## 2023-12-26 DIAGNOSIS — Z87891 Personal history of nicotine dependence: Secondary | ICD-10-CM | POA: Diagnosis not present

## 2023-12-26 DIAGNOSIS — Y832 Surgical operation with anastomosis, bypass or graft as the cause of abnormal reaction of the patient, or of later complication, without mention of misadventure at the time of the procedure: Secondary | ICD-10-CM | POA: Insufficient documentation

## 2023-12-26 DIAGNOSIS — I70235 Atherosclerosis of native arteries of right leg with ulceration of other part of foot: Secondary | ICD-10-CM | POA: Diagnosis not present

## 2023-12-26 DIAGNOSIS — T82858A Stenosis of vascular prosthetic devices, implants and grafts, initial encounter: Secondary | ICD-10-CM

## 2023-12-26 DIAGNOSIS — L97519 Non-pressure chronic ulcer of other part of right foot with unspecified severity: Secondary | ICD-10-CM | POA: Diagnosis not present

## 2023-12-26 DIAGNOSIS — T8789 Other complications of amputation stump: Secondary | ICD-10-CM

## 2023-12-26 LAB — POCT I-STAT, CHEM 8
BUN: 27 mg/dL — ABNORMAL HIGH (ref 8–23)
Calcium, Ion: 1.18 mmol/L (ref 1.15–1.40)
Chloride: 102 mmol/L (ref 98–111)
Creatinine, Ser: 0.9 mg/dL (ref 0.61–1.24)
Glucose, Bld: 115 mg/dL — ABNORMAL HIGH (ref 70–99)
HCT: 39 % (ref 39.0–52.0)
Hemoglobin: 13.3 g/dL (ref 13.0–17.0)
Potassium: 4.2 mmol/L (ref 3.5–5.1)
Sodium: 137 mmol/L (ref 135–145)
TCO2: 24 mmol/L (ref 22–32)

## 2023-12-26 SURGERY — ABDOMINAL AORTOGRAM
Anesthesia: LOCAL

## 2023-12-26 MED ORDER — ASPIRIN 81 MG PO TBEC: 81.0000 mg | DELAYED_RELEASE_TABLET | Freq: Every day | ORAL | Status: DC

## 2023-12-26 MED ORDER — HEPARIN SODIUM (PORCINE) 1000 UNIT/ML IJ SOLN
INTRAMUSCULAR | Status: AC
  Filled 2023-12-26: qty 10

## 2023-12-26 MED ORDER — ACETAMINOPHEN 325 MG PO TABS: 650.0000 mg | ORAL_TABLET | ORAL | Status: DC | PRN

## 2023-12-26 MED ORDER — PROTAMINE SULFATE 10 MG/ML IV SOLN
INTRAVENOUS | Status: DC | PRN
  Administered 2023-12-26: 25 mg via INTRAVENOUS

## 2023-12-26 MED ORDER — HEPARIN (PORCINE) IN NACL 1000-0.9 UT/500ML-% IV SOLN
INTRAVENOUS | Status: DC | PRN
  Administered 2023-12-26 (×2): 500 mL via INTRA_ARTERIAL

## 2023-12-26 MED ORDER — ONDANSETRON HCL 4 MG/2ML IJ SOLN: 4.0000 mg | Freq: Four times a day (QID) | INTRAMUSCULAR | Status: DC | PRN

## 2023-12-26 MED ORDER — OXYCODONE HCL 5 MG PO TABS: 5.0000 mg | ORAL_TABLET | ORAL | Status: DC | PRN

## 2023-12-26 MED ORDER — LABETALOL HCL 5 MG/ML IV SOLN: 10.0000 mg | INTRAVENOUS | Status: DC | PRN

## 2023-12-26 MED ORDER — SODIUM CHLORIDE 0.9% FLUSH: 3.0000 mL | INTRAVENOUS | Status: DC | PRN

## 2023-12-26 MED ORDER — MORPHINE SULFATE (PF) 2 MG/ML IV SOLN: 2.0000 mg | INTRAVENOUS | Status: DC | PRN

## 2023-12-26 MED ORDER — HEPARIN SODIUM (PORCINE) 1000 UNIT/ML IJ SOLN
INTRAMUSCULAR | Status: DC | PRN
  Administered 2023-12-26: 5000 [IU] via INTRAVENOUS

## 2023-12-26 MED ORDER — SODIUM CHLORIDE 0.9 % IV SOLN: INTRAVENOUS | Status: DC

## 2023-12-26 MED ORDER — FENTANYL CITRATE (PF) 100 MCG/2ML IJ SOLN
INTRAMUSCULAR | Status: AC
  Filled 2023-12-26: qty 2

## 2023-12-26 MED ORDER — FENTANYL CITRATE (PF) 100 MCG/2ML IJ SOLN
INTRAMUSCULAR | Status: DC | PRN
  Administered 2023-12-26: 50 ug via INTRAVENOUS

## 2023-12-26 MED ORDER — PROTAMINE SULFATE 10 MG/ML IV SOLN
INTRAVENOUS | Status: AC
  Filled 2023-12-26: qty 5

## 2023-12-26 MED ORDER — SODIUM CHLORIDE 0.9% FLUSH: 3.0000 mL | Freq: Two times a day (BID) | INTRAVENOUS | Status: DC

## 2023-12-26 MED ORDER — SODIUM CHLORIDE 0.9 % WEIGHT BASED INFUSION: 1.0000 mL/kg/h | INTRAVENOUS | Status: DC

## 2023-12-26 MED ORDER — SODIUM CHLORIDE 0.9 % IV SOLN: 250.0000 mL | INTRAVENOUS | Status: DC | PRN

## 2023-12-26 MED ORDER — LIDOCAINE HCL (PF) 1 % IJ SOLN
INTRAMUSCULAR | Status: AC
Start: 2023-12-26 — End: 2023-12-26
  Filled 2023-12-26: qty 30

## 2023-12-26 MED ORDER — HYDRALAZINE HCL 20 MG/ML IJ SOLN: 5.0000 mg | INTRAMUSCULAR | Status: DC | PRN

## 2023-12-26 MED ORDER — LIDOCAINE HCL (PF) 1 % IJ SOLN
INTRAMUSCULAR | Status: DC | PRN
Start: 2023-12-26 — End: 2023-12-26
  Administered 2023-12-26: 10 mL

## 2023-12-26 MED ORDER — MIDAZOLAM HCL 2 MG/2ML IJ SOLN
INTRAMUSCULAR | Status: AC
  Filled 2023-12-26: qty 2

## 2023-12-26 MED ORDER — ASPIRIN 81 MG PO CHEW
CHEWABLE_TABLET | ORAL | Status: DC | PRN
  Administered 2023-12-26: 81 mg via ORAL

## 2023-12-26 MED ORDER — ASPIRIN 81 MG PO CHEW
CHEWABLE_TABLET | ORAL | Status: AC
  Filled 2023-12-26: qty 1

## 2023-12-26 MED ORDER — MIDAZOLAM HCL 2 MG/2ML IJ SOLN
INTRAMUSCULAR | Status: DC | PRN
  Administered 2023-12-26: 1 mg via INTRAVENOUS

## 2023-12-26 SURGICAL SUPPLY — 20 items
CATH NAVICROSS ST 65CM (CATHETERS) IMPLANT
CATH OMNI FLUSH 5F 65CM (CATHETERS) IMPLANT
CATH SHOCKWAVE M5 7.0X60 (CATHETERS) IMPLANT
CATH SOFT-VU 4F 65 STRAIGHT (CATHETERS) IMPLANT
COVER DOME SNAP 22 D (MISCELLANEOUS) IMPLANT
GUIDEWIRE ANGLED .035 180CM (WIRE) IMPLANT
KIT ENCORE 26 ADVANTAGE (KITS) IMPLANT
KIT MICROPUNCTURE NIT STIFF (SHEATH) IMPLANT
KIT PV (KITS) ×2 IMPLANT
KIT SINGLE USE MANIFOLD (KITS) IMPLANT
SET ATX-X65L (MISCELLANEOUS) IMPLANT
SHEATH CATAPULT 6FR 45 (SHEATH) IMPLANT
SHEATH PINNACLE 5F 10CM (SHEATH) IMPLANT
SHEATH PROBE COVER 6X72 (BAG) IMPLANT
SYR MEDRAD MARK 7 150ML (SYRINGE) ×2 IMPLANT
TRANSDUCER W/STOPCOCK (MISCELLANEOUS) ×2 IMPLANT
TRAY PV CATH (CUSTOM PROCEDURE TRAY) ×2 IMPLANT
WIRE BENTSON .035X145CM (WIRE) IMPLANT
WIRE G V18X300CM (WIRE) IMPLANT
WIRE SPARTACORE .014X300CM (WIRE) IMPLANT

## 2023-12-26 NOTE — Interval H&P Note (Signed)
 History and Physical Interval Note:  12/26/2023 7:28 AM  Jorge Clements  has presented today for surgery, with the diagnosis of atherosclerosis right lowe extremity bypass and ulcer.  The various methods of treatment have been discussed with the patient and family. After consideration of risks, benefits and other options for treatment, the patient has consented to  Procedure(s): ABDOMINAL AORTOGRAM (N/A) Lower Extremity Angiography (N/A) LOWER EXTREMITY INTERVENTION (N/A) as a surgical intervention.  The patient's history has been reviewed, patient examined, no change in status, stable for surgery.  I have reviewed the patient's chart and labs.  Questions were answered to the patient's satisfaction.     [name]

## 2023-12-26 NOTE — Progress Notes (Signed)
 Discharge instructions reviewed with patient and ex wife over the phone. Denies questions or concerns at this time. PT was able to void multiple times I the urinal at bedside. PT also ambulated in the hallway without complications. No S/S of complications at incision site. PT was escorted from the unit via wheel chair to personal vehicle.

## 2023-12-26 NOTE — Op Note (Signed)
 Patient name: Jorge Clements MRN: [id number] DOB: Sep 17, 1945 Sex: male  12/26/2023 Pre-operative Diagnosis: History of right lower extremity bypass with evidence of inflow stenosis and persistent right foot multifactorial wound Post-operative diagnosis:  Same Surgeon:  Penne BROCKS. Sheree, MD Procedure Performed: 1.  Percutaneous ultrasound-guided cannulation left common femoral artery 2.  Aortogram 3.  Selection of right lower extremity bypass and right lower extremity angiogram 4.  Shockwave lithotripsy right common femoral artery with 7 x 60 mm balloon 5.  Moderate sedation with fentanyl  and Versed  for 50 minutes  Indications: 78 year old male underwent right lower extremity bypass several years ago subsequent transmetatarsal amputation on the right.  He is now followed by Dr. Harden for a wound on the right transmetatarsal amputation.  There is evidence of inflow stenosis of the bypass graft he is indicated for angiography with possible intervention.  Findings: Aorta and iliac segments are calcified however there is no flow-limiting stenosis.  The left common femoral artery was calcified by ultrasound and we planned not to place a closure device at completion.  There was a 20 mmHg gradient across the proximal common femoral artery and this was treated with 7 x 60 mm shockwave lithotripsy for a total of 300 pulses.  Unfortunately the shockwave balloon became lodged in the 6 French sheath at completion and we were unable to perform completion angiography or evaluate for a gradient.  Patient does have a palpable anterior tibial pulse of the ankle at completion.  The bypass graft itself is patent all the way to the above-knee popliteal artery where he then has three-vessel runoff although disease in the TP trunk the anterior tibial artery is very large and gives rise to the dorsalis pedis.  The posterior tibial and peroneal arteries are also patent all the way to the ankle and the posterior tibial artery  also fills the foot.   Procedure:  The patient was identified in the holding area and taken to room 8.  The patient was then placed supine on the table and prepped and draped in the usual sterile fashion.  A time out was called.  Ultrasound was used to evaluate the left common femoral artery which was noted to have posterior plaque.  The area was anesthetized with 1% lidocaine  and cannulated with micropuncture followed by wire sheath.  Ultrasound images saved the permanent record.  Concomitantly we administered fentanyl  and Versed  as moderate sedation as vital signs were monitored by bedside nursing throughout the case.  We placed the Bentson wire followed by 5 French sheath and Omni catheter to the level of L 1 and aortogram was performed and then we performed pelvic angiography at LAO projection which initially demonstrated the common femoral lesion on the right leading into the bypass.  To cross the bifurcation we required an Omni catheter followed by Glidewire followed by Navi cross catheter and ultimately we are able to get into the right lower extremity bypass and complete right lower extremity angiography.  With the above findings we then placed a long 6 French sheath followed by a 014 wire and perform shockwave lithotripsy with 7 x 60 mm balloon at 2, 4, 6 atm for a total of 300 pulses.  We were able to get completion angiography after 180 pulses but then after an additional 120 pulses the balloon became lodged.  I was unable to get any wires around the large balloon and I difficulty removing the wire.  As such I pulled back the sheath as far as I  could remove the back of the sheath and attempted to get an 018 and a Glidewire around the large balloon.  Ultimately we were unable to get around this and the sheath had to be pulled and pressure was held until hemostasis was obtained.  The balloon and wires were all noted to be intact.  Patient had a palpable anterior tibial artery pulse which was multiphasic  by Doppler and I elected not to reaccessed the groin for completion angiography sake.  He did tolerate the procedure without immediate complication.  Contrast: 80 cc   Carmesha Morocco C. Sheree, MD [facility] [place] Office: [phone] Pager: [phone]

## 2023-12-26 NOTE — Discharge Instructions (Addendum)
 Femoral Site Care This sheet gives you information about how to care for yourself after your procedure. Your health care provider may also give you more specific instructions. If you have problems or questions, contact your health care provider. What can I expect after the procedure?  After the procedure, it is common to have: Bruising that usually fades within 1-2 weeks. Tenderness at the site. Follow these instructions at home: Wound care Follow instructions from your health care provider about how to take care of your insertion site. Make sure you: Wash your hands with soap and water before you change your bandage (dressing). If soap and water are not available, use hand sanitizer. Remove your dressing as told by your health care provider. In 24 hours Do not take baths, swim, or use a hot tub until your health care provider approves. You may shower 24-48 hours after the procedure or as told by your health care provider. Gently wash the site with plain soap and water. Pat the area dry with a clean towel. Do not rub the site. This may cause bleeding. Do not apply powder or lotion to the site. Keep the site clean and dry. Check your femoral site every day for signs of infection. Check for: Redness, swelling, or pain. Fluid or blood. Warmth. Pus or a bad smell. Activity For the first 2-3 days after your procedure, or as long as directed: Avoid climbing stairs as much as possible. Do not squat. Do not lift anything that is heavier than 10 lb (4.5 kg), or the limit that you are told, until your health care provider says that it is safe. For 5 days Rest as directed. Avoid sitting for a long time without moving. Get up to take short walks every 1-2 hours. Do not drive for 24 hours if you were given a medicine to help you relax (sedative). General instructions Take over-the-counter and prescription medicines only as told by your health care provider. Keep all follow-up visits as told by  your health care provider. This is important. Contact a health care provider if you have: A fever or chills. You have redness, swelling, or pain around your insertion site. Get help right away if: The catheter insertion area swells very fast. You pass out. You suddenly start to sweat or your skin gets clammy. The catheter insertion area is bleeding, and the bleeding does not stop when you hold steady pressure on the area. The area near or just beyond the catheter insertion site becomes pale, cool, tingly, or numb. These symptoms may represent a serious problem that is an emergency. Do not wait to see if the symptoms will go away. Get medical help right away. Call your local emergency services (911 in the [place] Do not drive yourself to the hospital. Summary After the procedure, it is common to have bruising that usually fades within 1-2 weeks. Check your femoral site every day for signs of infection. Do not lift anything that is heavier than 10 lb (4.5 kg), or the limit that you are told, until your health care provider says that it is safe. This information is not intended to replace advice given to you by your health care provider. Make sure you discuss any questions you have with your health care provider. Document Revised: 04/18/2017 Document Reviewed: 04/18/2017 Elsevier Patient Education  2020 [employer].

## 2023-12-27 ENCOUNTER — Encounter (HOSPITAL_COMMUNITY): Payer: Self-pay | Admitting: Vascular Surgery

## 2023-12-29 ENCOUNTER — Ambulatory Visit: Admitting: Orthopedic Surgery

## 2023-12-29 DIAGNOSIS — Z89431 Acquired absence of right foot: Secondary | ICD-10-CM

## 2023-12-29 DIAGNOSIS — L97511 Non-pressure chronic ulcer of other part of right foot limited to breakdown of skin: Secondary | ICD-10-CM

## 2024-01-01 ENCOUNTER — Encounter: Payer: Self-pay | Admitting: Orthopedic Surgery

## 2024-01-01 NOTE — Progress Notes (Addendum)
 Office Visit Note   Patient: Jorge Clements           Date of Birth: November 09, 1945           MRN: [id number] Visit Date: 12/29/2023              Requested by: Sheree Penne Bruckner, MD [address] [place],  [place] [address] PCP: Shona Norleen PEDLAR, MD  Chief Complaint  Patient presents with   Right Foot - Wound Check      HPI: Discussed the use of AI scribe software for clinical note transcription with the patient, who gave verbal consent to proceed.  History of Present Illness Jorge Clements is a 78 year old male with a history of right transtibial amputation and venous insufficiency who presents for follow-up after endovascular intervention for right femoral artery.  He is three days post-endovascular intervention for the right femoral artery. He has a history of right transtibial amputation due to venous insufficiency.     Assessment & Plan: Visit Diagnoses:  1. History of transmetatarsal amputation of right foot (HCC)   2. Non-pressure chronic ulcer of other part of right foot limited to breakdown of skin (HCC)     Plan: Assessment and Plan Assessment & Plan Right plantar foot ulcer with transmetatarsal amputation, status post debridement Ulcer on right plantar foot, 4 cm, healthy granulation, no tunneling, exposed bone, tendon, or drainage. - Apply dry dressing. - Continue protective shoe wear. - Follow up in 4 weeks.  Massive hypertrophic callus of right foot and ankle Massive hypertrophic callus on right foot and ankle.  Venous insufficiency with stasis changes of right leg Venous stasis changes in right leg, no drainage or ulcers.      Follow-Up Instructions: No follow-ups on file.   Ortho Exam  Patient is alert, oriented, no adenopathy, well-dressed, normal affect, normal respiratory effort. Physical Exam EXTREMITIES: Wagner grade 1 ulcer on the plantar aspect of the right foot with transmetatarsal amputation.  After informed consent a 10 blade  knife was used to debride the skin and soft tissue back to flat healthy granulation tissue.  After debridement the wound measures 4 x 4 cm.  Ulcer measures 4 cm in diameter, flat with healthy granulation tissue. No tunneling, exposed bone, tendon, or drainage in the ulcer. Massive hypertrophic callus on the right foot and ankle. Venous stasis changes in the right leg, no drainage or ulcers.      Imaging: No results found. No images are attached to the encounter.  Labs: Lab Results  Component Value Date   HGBA1C 6.1 (H) 02/28/2023   HGBA1C 6.4 (H) 12/02/2019   ESRSEDRATE 28 (H) 01/18/2021   ESRSEDRATE 40 (H) 12/24/2020   CRP 11.1 (H) 01/18/2021   CRP 1.9 (H) 12/24/2020   REPTSTATUS 12/27/2020 FINAL 12/24/2020   GRAMSTAIN NO WBC SEEN NO ORGANISMS SEEN  12/24/2020   CULT  12/24/2020    FEW STAPHYLOCOCCUS AUREUS WITHIN MIXED ORGANISMS Performed at [facility], [address] [place], [place] [address]    [facility] STAPHYLOCOCCUS AUREUS 12/24/2020     Lab Results  Component Value Date   ALBUMIN 3.8 02/28/2023   ALBUMIN 4.0 03/23/2022   ALBUMIN 3.9 12/01/2019    Lab Results  Component Value Date   MG 2.2 08/17/2017   MG 1.8 08/15/2017   No results found for: VD25OH  No results found for: PREALBUMIN    Latest Ref Rng & Units 12/26/2023    7:51 AM 02/28/2023   12:18  PM 03/23/2022    8:00 PM  CBC EXTENDED  WBC 4.0 - 10.5 K/uL  9.1  9.6   RBC 4.22 - 5.81 MIL/uL  4.34  4.67   Hemoglobin 13.0 - 17.0 g/dL 86.6  86.7  85.9   HCT 39.0 - 52.0 % 39.0  40.0  43.5   Platelets 150 - 400 K/uL  156  132   NEUT# 1.7 - 7.7 K/uL   7.5   Lymph# 0.7 - 4.0 K/uL   1.4      There is no height or weight on file to calculate BMI.  Orders:  No orders of the defined types were placed in this encounter.  No orders of the defined types were placed in this encounter.    Procedures: No procedures performed  Clinical Data: No additional findings.  ROS:  All other systems  negative, except as noted in the HPI. Review of Systems  Objective: Vital Signs: There were no vitals taken for this visit.  Specialty Comments:  No specialty comments available.  PMFS History: Patient Active Problem List   Diagnosis Date Noted   Alcohol abuse 12/02/2021   Anxiety 12/02/2021   Right leg pain 12/02/2021   Chest pain 04/14/2021   CAD (coronary artery disease)    Unstable angina (HCC)    Venous insufficiency (chronic) (peripheral)    Ulcer of right foot limited to breakdown of skin (HCC)    Chest pain, rule out acute myocardial infarction 12/23/2020   PAD (peripheral artery disease) (HCC) 12/23/2020   HLD (hyperlipidemia) 12/23/2020   ACS (acute coronary syndrome) (HCC) 12/01/2019   Non-ST elevation (NSTEMI) myocardial infarction (HCC) 12/01/2019   NSTEMI (non-ST elevated myocardial infarction) (HCC) 12/01/2019   History of transmetatarsal amputation of right foot (HCC)    Subacute osteomyelitis, right ankle and foot (HCC)    Infected blister of foot 08/13/2017   Pressure injury of skin 08/12/2017   Weakness 08/11/2017   COPD (chronic obstructive pulmonary disease) (HCC) 08/11/2017   Cellulitis of right lower extremity 08/11/2017   Fall at home, initial encounter 08/11/2017   Thrombocytopenia (HCC) 08/11/2017   Weakness generalized 08/11/2017   Hypokalemia 08/11/2017   Essential hypertension 08/11/2017   Past Medical History:  Diagnosis Date   Abdominal aortic aneurysm    no AAA on US  in 2020   CAD (coronary artery disease)    Stent to RCA 2021   Chronic obstructive pulmonary disease    HLD (hyperlipidemia)    HTN (hypertension)    Peripheral artery disease    s/p R fem pop // s/p R transmet amp    Family History  Adopted: Yes    Past Surgical History:  Procedure Laterality Date   ABDOMINAL AORTOGRAM N/A 08/17/2017   Procedure: ABDOMINAL AORTOGRAM;  Surgeon: Sheree Penne Bruckner, MD;  Location: [facility] INVASIVE CV LAB;  Service: Cardiovascular;   Laterality: N/A;   ABDOMINAL AORTOGRAM N/A 12/26/2023   Procedure: ABDOMINAL AORTOGRAM;  Surgeon: Sheree Penne Bruckner, MD;  Location: [facility] INVASIVE CV LAB;  Service: Cardiovascular;  Laterality: N/A;   ABDOMINAL AORTOGRAM W/LOWER EXTREMITY Right 07/13/2021   Procedure: ABDOMINAL AORTOGRAM W/LOWER EXTREMITY;  Surgeon: Sheree Penne Bruckner, MD;  Location: [facility] INVASIVE CV LAB;  Service: Cardiovascular;  Laterality: Right;   AMPUTATION Right 08/21/2017   Procedure: TRANSMETATARSAL AMPUTATION RIGHT FOOT;  Surgeon: Harden Jerona GAILS, MD;  Location: [facility] OR;  Service: Orthopedics;  Laterality: Right;   AMPUTATION TOE Right 08/18/2017   Procedure: AMPUTATION RIGHT SECOND AND THIRD TOES;  Surgeon: Sheree,  Penne Bruckner, MD;  Location: [facility] OR;  Service: Vascular;  Laterality: Right;   CORONARY BALLOON ANGIOPLASTY N/A 12/03/2019   Procedure: CORONARY BALLOON ANGIOPLASTY;  Surgeon: Dann Candyce RAMAN, MD;  Location: [facility] INVASIVE CV LAB;  Service: Cardiovascular;  Laterality: N/A;   CORONARY STENT INTERVENTION N/A 12/03/2019   Procedure: CORONARY STENT INTERVENTION;  Surgeon: Dann Candyce RAMAN, MD;  Location: [facility] INVASIVE CV LAB;  Service: Cardiovascular;  Laterality: N/A;   ENDARTERECTOMY FEMORAL Right 08/18/2017   Procedure: ENDARTERECTOMY RIGHT SUPERFICIAL SCHERYL;  Surgeon: Sheree Penne Bruckner, MD;  Location: [facility] OR;  Service: Vascular;  Laterality: Right;   FEMORAL-POPLITEAL BYPASS GRAFT Right 08/18/2017   Procedure: BYPASS GRAFT RIGHT COMMON FEMORAL TO ABOVE KNEE POPLITEAL ARTERY USING RIGHT REVERSED GREAT SAPHENOUS VEIN;  Surgeon: Sheree Penne Bruckner, MD;  Location: [facility] OR;  Service: Vascular;  Laterality: Right;   LEFT HEART CATH AND CORONARY ANGIOGRAPHY N/A 12/03/2019   Procedure: LEFT HEART CATH AND CORONARY ANGIOGRAPHY;  Surgeon: Dann Candyce RAMAN, MD;  Location: [facility] INVASIVE CV LAB;  Service: Cardiovascular;  Laterality: N/A;   LEFT HEART CATH AND CORONARY ANGIOGRAPHY N/A 04/14/2021    Procedure: LEFT HEART CATH AND CORONARY ANGIOGRAPHY;  Surgeon: Dann Candyce RAMAN, MD;  Location: [facility] INVASIVE CV LAB;  Service: Cardiovascular;  Laterality: N/A;   LOWER EXTREMITY ANGIOGRAPHY Right 08/17/2017   Procedure: Lower Extremity Angiography;  Surgeon: Sheree Penne Bruckner, MD;  Location: [facility] INVASIVE CV LAB;  Service: Cardiovascular;  Laterality: Right;   LOWER EXTREMITY ANGIOGRAPHY N/A 12/26/2023   Procedure: Lower Extremity Angiography;  Surgeon: Sheree Penne Bruckner, MD;  Location: [facility] INVASIVE CV LAB;  Service: Cardiovascular;  Laterality: N/A;   LOWER EXTREMITY INTERVENTION N/A 12/26/2023   Procedure: LOWER EXTREMITY INTERVENTION;  Surgeon: Sheree Penne Bruckner, MD;  Location: [facility] INVASIVE CV LAB;  Service: Cardiovascular;  Laterality: N/A;   PERIPHERAL INTRAVASCULAR LITHOTRIPSY  12/26/2023   Procedure: PERIPHERAL INTRAVASCULAR LITHOTRIPSY;  Surgeon: Sheree Penne Bruckner, MD;  Location: [facility] INVASIVE CV LAB;  Service: Cardiovascular;;   PERIPHERAL VASCULAR BALLOON ANGIOPLASTY Right 08/17/2017   Procedure: PERIPHERAL VASCULAR BALLOON ANGIOPLASTY;  Surgeon: Sheree Penne Bruckner, MD;  Location: [facility] INVASIVE CV LAB;  Service: Cardiovascular;  Laterality: Right;  SFA UNABLE TO CROSS   VEIN HARVEST Right 08/18/2017   Procedure: VEIN HARVEST RIGHT GREAT SAPHENOUS;  Surgeon: Sheree Penne Bruckner, MD;  Location: [facility] OR;  Service: Vascular;  Laterality: Right;   WOUND DEBRIDEMENT Right 08/18/2017   Procedure: DEBRIDEMENT WOUND RIGHT FOOT;  Surgeon: Sheree Penne Bruckner, MD;  Location: [facility] OR;  Service: Vascular;  Laterality: Right;   Social History   Occupational History   Not on file  Tobacco Use   Smoking status: Former    Passive exposure: Never   Smokeless tobacco: Never  Vaping Use   Vaping status: Former  Substance and Sexual Activity   Alcohol use: Not Currently   Drug use: No   Sexual activity: Not on file

## 2024-01-02 ENCOUNTER — Other Ambulatory Visit: Payer: Self-pay

## 2024-01-02 DIAGNOSIS — I70735 Atherosclerosis of other type of bypass graft(s) of the right leg with ulceration of other part of foot: Secondary | ICD-10-CM

## 2024-01-02 DIAGNOSIS — I739 Peripheral vascular disease, unspecified: Secondary | ICD-10-CM

## 2024-01-30 ENCOUNTER — Ambulatory Visit: Admitting: Orthopedic Surgery

## 2024-01-30 DIAGNOSIS — L97511 Non-pressure chronic ulcer of other part of right foot limited to breakdown of skin: Secondary | ICD-10-CM

## 2024-01-30 DIAGNOSIS — I87333 Chronic venous hypertension (idiopathic) with ulcer and inflammation of bilateral lower extremity: Secondary | ICD-10-CM

## 2024-01-30 DIAGNOSIS — Z89431 Acquired absence of right foot: Secondary | ICD-10-CM | POA: Diagnosis not present

## 2024-01-31 ENCOUNTER — Encounter: Payer: Self-pay | Admitting: Orthopedic Surgery

## 2024-01-31 NOTE — Progress Notes (Signed)
 Office Visit Note   Patient: Jorge Clements           Date of Birth: [birth year]           MRN: [id number] Visit Date: 01/30/2024              Requested by: Shona Norleen PEDLAR, MD [address] [place],  [place] [address] PCP: Shona Norleen PEDLAR, MD  Chief Complaint  Patient presents with   Right Foot - Wound Check      HPI: Discussed the use of AI scribe software for clinical note transcription with the patient, who gave verbal consent to proceed.  History of Present Illness Jorge Clements is a 78 year old male who presents with hypertrophic scar tissue and ulcers on the foot and ankle. He has hypertrophic scar tissue and ulcers on the foot and ankle, with scab tissue and discharge present. He feels unwell, possibly due to a reaction following a procedure for a blockage in a male artery.  He is unable to change his medical compression stocking daily and is awaiting assistance. He describes feeling 'so bad' and mentions having a 'tin door in the bottom' of his foot, along with 'big old kind of growths of scar tissue' on his leg.  He experiences swelling and drainage from the affected area.     Assessment & Plan: Visit Diagnoses:  1. History of transmetatarsal amputation of right foot (HCC)   2. Non-pressure chronic ulcer of other part of right foot limited to breakdown of skin (HCC)   3. Chronic venous hypertension (idiopathic) with ulcer and inflammation of bilateral lower extremity (HCC)     Plan: Assessment and Plan Assessment & Plan Circumferential hypertrophic callus and superficial ulceration of foot and ankle Massive hypertrophic callus with Wagner grade one ulcers. Ulcers do not reach bone or tendon. Condition improving. - Perform debridement of the foot, ankle, and leg. - Cleanse with Vosh. - Wrap with multilayer compression dressing. - Instruct to keep the foot elevated.  Post transmetatarsal amputation distal ulcers Two ulcers on distal transmetatarsal amputation.  No bone or tendon involvement. - Perform debridement of the ulcers. - Cleanse with Vosh. - Wrap with multilayer compression dressing.  Lower extremity edema Significant swelling contributing to ulceration and callus formation. Requires management. - Wrap with multilayer compression dressing. - Instruct to keep the foot elevated.      Follow-Up Instructions: No follow-ups on file.   Ortho Exam  Patient is alert, oriented, no adenopathy, well-dressed, normal affect, normal respiratory effort. Physical Exam EXTREMITIES: Massive hypertrophic calluses circumferentially around the foot and ankle with several Wagner grade 1 ulcers. Circumferential superficial ulceration around the ankle and foot with two ulcers on the distal aspect of the transmetatarsal amputation. Ulcers are 2 cm in diameter, probe down 0.5 cm, not to bone or tendon. No ascending cellulitis. No purulent drainage.      Imaging: No results found. No images are attached to the encounter.  Labs: Lab Results  Component Value Date   HGBA1C 6.1 (H) 02/28/2023   HGBA1C 6.4 (H) 12/02/2019   ESRSEDRATE 28 (H) 01/18/2021   ESRSEDRATE 40 (H) 12/24/2020   CRP 11.1 (H) 01/18/2021   CRP 1.9 (H) 12/24/2020   REPTSTATUS 12/27/2020 FINAL 12/24/2020   GRAMSTAIN NO WBC SEEN NO ORGANISMS SEEN  12/24/2020   CULT  12/24/2020    FEW STAPHYLOCOCCUS AUREUS WITHIN MIXED ORGANISMS Performed at [facility], [address], [place], [place] [address]    [facility]  STAPHYLOCOCCUS AUREUS 12/24/2020     Lab Results  Component Value Date   ALBUMIN 3.8 02/28/2023   ALBUMIN 4.0 03/23/2022   ALBUMIN 3.9 12/01/2019    Lab Results  Component Value Date   MG 2.2 08/17/2017   MG 1.8 08/15/2017   No results found for: VD25OH  No results found for: PREALBUMIN    Latest Ref Rng & Units 12/26/2023    7:51 AM 02/28/2023   12:18 PM 03/23/2022    8:00 PM  CBC EXTENDED  WBC 4.0 - 10.5 K/uL  9.1  9.6   RBC 4.22 - 5.81 MIL/uL   4.34  4.67   Hemoglobin 13.0 - 17.0 g/dL 86.6  86.7  85.9   HCT 39.0 - 52.0 % 39.0  40.0  43.5   Platelets 150 - 400 K/uL  156  132   NEUT# 1.7 - 7.7 K/uL   7.5   Lymph# 0.7 - 4.0 K/uL   1.4      There is no height or weight on file to calculate BMI.  Orders:  No orders of the defined types were placed in this encounter.  No orders of the defined types were placed in this encounter.    Procedures: No procedures performed  Clinical Data: No additional findings.  ROS:  All other systems negative, except as noted in the HPI. Review of Systems  Objective: Vital Signs: There were no vitals taken for this visit.  Specialty Comments:  No specialty comments available.  PMFS History: Patient Active Problem List   Diagnosis Date Noted   Alcohol abuse 12/02/2021   Anxiety 12/02/2021   Right leg pain 12/02/2021   Chest pain 04/14/2021   CAD (coronary artery disease)    Unstable angina (HCC)    Venous insufficiency (chronic) (peripheral)    Ulcer of right foot limited to breakdown of skin (HCC)    Chest pain, rule out acute myocardial infarction 12/23/2020   PAD (peripheral artery disease) 12/23/2020   HLD (hyperlipidemia) 12/23/2020   ACS (acute coronary syndrome) (HCC) 12/01/2019   Non-ST elevation (NSTEMI) myocardial infarction (HCC) 12/01/2019   NSTEMI (non-ST elevated myocardial infarction) (HCC) 12/01/2019   History of transmetatarsal amputation of right foot (HCC)    Subacute osteomyelitis, right ankle and foot (HCC)    Infected blister of foot 08/13/2017   Pressure injury of skin 08/12/2017   Weakness 08/11/2017   COPD (chronic obstructive pulmonary disease) (HCC) 08/11/2017   Cellulitis of right lower extremity 08/11/2017   Fall at home, initial encounter 08/11/2017   Thrombocytopenia 08/11/2017   Weakness generalized 08/11/2017   Hypokalemia 08/11/2017   Essential hypertension 08/11/2017   Past Medical History:  Diagnosis Date   Abdominal aortic aneurysm     no AAA on US  in 2020   CAD (coronary artery disease)    Stent to RCA 2021   Chronic obstructive pulmonary disease    HLD (hyperlipidemia)    HTN (hypertension)    Peripheral artery disease    s/p R fem pop // s/p R transmet amp    Family History  Adopted: Yes    Past Surgical History:  Procedure Laterality Date   ABDOMINAL AORTOGRAM N/A 08/17/2017   Procedure: ABDOMINAL AORTOGRAM;  Surgeon: Sheree Penne Bruckner, MD;  Location: [facility] INVASIVE CV LAB;  Service: Cardiovascular;  Laterality: N/A;   ABDOMINAL AORTOGRAM N/A 12/26/2023   Procedure: ABDOMINAL AORTOGRAM;  Surgeon: Sheree Penne Bruckner, MD;  Location: [facility] INVASIVE CV LAB;  Service: Cardiovascular;  Laterality: N/A;   ABDOMINAL  AORTOGRAM W/LOWER EXTREMITY Right 07/13/2021   Procedure: ABDOMINAL AORTOGRAM W/LOWER EXTREMITY;  Surgeon: Sheree Penne Bruckner, MD;  Location: [facility] INVASIVE CV LAB;  Service: Cardiovascular;  Laterality: Right;   AMPUTATION Right 08/21/2017   Procedure: TRANSMETATARSAL AMPUTATION RIGHT FOOT;  Surgeon: Harden Jerona GAILS, MD;  Location: [facility] OR;  Service: Orthopedics;  Laterality: Right;   AMPUTATION TOE Right 08/18/2017   Procedure: AMPUTATION RIGHT SECOND AND THIRD TOES;  Surgeon: Sheree Penne Bruckner, MD;  Location: [facility] OR;  Service: Vascular;  Laterality: Right;   CORONARY BALLOON ANGIOPLASTY N/A 12/03/2019   Procedure: CORONARY BALLOON ANGIOPLASTY;  Surgeon: Dann Candyce RAMAN, MD;  Location: [facility] INVASIVE CV LAB;  Service: Cardiovascular;  Laterality: N/A;   CORONARY STENT INTERVENTION N/A 12/03/2019   Procedure: CORONARY STENT INTERVENTION;  Surgeon: Dann Candyce RAMAN, MD;  Location: [facility] INVASIVE CV LAB;  Service: Cardiovascular;  Laterality: N/A;   ENDARTERECTOMY FEMORAL Right 08/18/2017   Procedure: ENDARTERECTOMY RIGHT SUPERFICIAL SCHERYL;  Surgeon: Sheree Penne Bruckner, MD;  Location: [facility] OR;  Service: Vascular;  Laterality: Right;   FEMORAL-POPLITEAL BYPASS GRAFT Right 08/18/2017    Procedure: BYPASS GRAFT RIGHT COMMON FEMORAL TO ABOVE KNEE POPLITEAL ARTERY USING RIGHT REVERSED GREAT SAPHENOUS VEIN;  Surgeon: Sheree Penne Bruckner, MD;  Location: [facility] OR;  Service: Vascular;  Laterality: Right;   LEFT HEART CATH AND CORONARY ANGIOGRAPHY N/A 12/03/2019   Procedure: LEFT HEART CATH AND CORONARY ANGIOGRAPHY;  Surgeon: Dann Candyce RAMAN, MD;  Location: [facility] INVASIVE CV LAB;  Service: Cardiovascular;  Laterality: N/A;   LEFT HEART CATH AND CORONARY ANGIOGRAPHY N/A 04/14/2021   Procedure: LEFT HEART CATH AND CORONARY ANGIOGRAPHY;  Surgeon: Dann Candyce RAMAN, MD;  Location: [facility] INVASIVE CV LAB;  Service: Cardiovascular;  Laterality: N/A;   LOWER EXTREMITY ANGIOGRAPHY Right 08/17/2017   Procedure: Lower Extremity Angiography;  Surgeon: Sheree Penne Bruckner, MD;  Location: [facility] INVASIVE CV LAB;  Service: Cardiovascular;  Laterality: Right;   LOWER EXTREMITY ANGIOGRAPHY N/A 12/26/2023   Procedure: Lower Extremity Angiography;  Surgeon: Sheree Penne Bruckner, MD;  Location: [facility] INVASIVE CV LAB;  Service: Cardiovascular;  Laterality: N/A;   LOWER EXTREMITY INTERVENTION N/A 12/26/2023   Procedure: LOWER EXTREMITY INTERVENTION;  Surgeon: Sheree Penne Bruckner, MD;  Location: [facility] INVASIVE CV LAB;  Service: Cardiovascular;  Laterality: N/A;   PERIPHERAL INTRAVASCULAR LITHOTRIPSY  12/26/2023   Procedure: PERIPHERAL INTRAVASCULAR LITHOTRIPSY;  Surgeon: Sheree Penne Bruckner, MD;  Location: [facility] INVASIVE CV LAB;  Service: Cardiovascular;;   PERIPHERAL VASCULAR BALLOON ANGIOPLASTY Right 08/17/2017   Procedure: PERIPHERAL VASCULAR BALLOON ANGIOPLASTY;  Surgeon: Sheree Penne Bruckner, MD;  Location: [facility] INVASIVE CV LAB;  Service: Cardiovascular;  Laterality: Right;  SFA UNABLE TO CROSS   VEIN HARVEST Right 08/18/2017   Procedure: VEIN HARVEST RIGHT GREAT SAPHENOUS;  Surgeon: Sheree Penne Bruckner, MD;  Location: [facility] OR;  Service: Vascular;  Laterality: Right;   WOUND DEBRIDEMENT Right  08/18/2017   Procedure: DEBRIDEMENT WOUND RIGHT FOOT;  Surgeon: Sheree Penne Bruckner, MD;  Location: [facility] OR;  Service: Vascular;  Laterality: Right;   Social History   Occupational History   Not on file  Tobacco Use   Smoking status: Former    Passive exposure: Never   Smokeless tobacco: Never  Vaping Use   Vaping status: Former  Substance and Sexual Activity   Alcohol use: Not Currently   Drug use: No   Sexual activity: Not on file

## 2024-02-01 ENCOUNTER — Encounter (HOSPITAL_COMMUNITY)

## 2024-02-01 ENCOUNTER — Encounter

## 2024-02-01 ENCOUNTER — Other Ambulatory Visit (HOSPITAL_COMMUNITY)

## 2024-02-02 ENCOUNTER — Ambulatory Visit: Admitting: Physician Assistant

## 2024-02-02 DIAGNOSIS — Z89431 Acquired absence of right foot: Secondary | ICD-10-CM

## 2024-02-02 DIAGNOSIS — L97511 Non-pressure chronic ulcer of other part of right foot limited to breakdown of skin: Secondary | ICD-10-CM

## 2024-02-05 ENCOUNTER — Encounter: Payer: Self-pay | Admitting: Physician Assistant

## 2024-02-05 NOTE — Progress Notes (Signed)
 Office Visit Note   Patient: Jorge Clements           Date of Birth: [birth year]           MRN: [id number] Visit Date: 02/02/2024              Requested by: Shona Norleen PEDLAR, MD [address] [place],  [place] [address] PCP: Shona Norleen PEDLAR, MD  Chief Complaint  Patient presents with   Right Foot - Follow-up      HPI: Jorge Clements is a 78 year old male who presents with hypertrophic scar tissue and ulcers on the foot and ankle. He has hypertrophic scar tissue and ulcers on the foot and ankle   Assessment & Plan: Visit Diagnoses:  1. History of transmetatarsal amputation of right foot (HCC)   2. Non-pressure chronic ulcer of other part of right foot limited to breakdown of skin (HCC)     Plan: Wrap with multilayer compression dressing. - Instruct to keep the foot elevated.  Wound improving.  Follow-Up Instructions: Return in about 4 days (around 02/06/2024).   Ortho Exam  Patient is alert, oriented, no adenopathy, well-dressed, normal affect, normal respiratory effort. Improved ulcer plantar TMA stump 0.7 cm x 0.7 cm and posterior medial ankle 2 cm x 2 cm.  Continue edema due to dependent position.    No cellulitis or active drainage.  Dry skin was debrided with 4 x 4 and Vashe.      Imaging: No results found.    Labs: Lab Results  Component Value Date   HGBA1C 6.1 (H) 02/28/2023   HGBA1C 6.4 (H) 12/02/2019   ESRSEDRATE 28 (H) 01/18/2021   ESRSEDRATE 40 (H) 12/24/2020   CRP 11.1 (H) 01/18/2021   CRP 1.9 (H) 12/24/2020   REPTSTATUS 12/27/2020 FINAL 12/24/2020   GRAMSTAIN NO WBC SEEN NO ORGANISMS SEEN  12/24/2020   CULT  12/24/2020    FEW STAPHYLOCOCCUS AUREUS WITHIN MIXED ORGANISMS Performed at [facility] Lab, [address], [place], [place] [address]    [facility] STAPHYLOCOCCUS AUREUS 12/24/2020     Lab Results  Component Value Date   ALBUMIN 3.8 02/28/2023   ALBUMIN 4.0 03/23/2022   ALBUMIN 3.9 12/01/2019    Lab Results  Component Value Date    MG 2.2 08/17/2017   MG 1.8 08/15/2017   No results found for: VD25OH  No results found for: PREALBUMIN    Latest Ref Rng & Units 12/26/2023    7:51 AM 02/28/2023   12:18 PM 03/23/2022    8:00 PM  CBC EXTENDED  WBC 4.0 - 10.5 K/uL  9.1  9.6   RBC 4.22 - 5.81 MIL/uL  4.34  4.67   Hemoglobin 13.0 - 17.0 g/dL 86.6  86.7  85.9   HCT 39.0 - 52.0 % 39.0  40.0  43.5   Platelets 150 - 400 K/uL  156  132   NEUT# 1.7 - 7.7 K/uL   7.5   Lymph# 0.7 - 4.0 K/uL   1.4      There is no height or weight on file to calculate BMI.  Orders:  No orders of the defined types were placed in this encounter.  No orders of the defined types were placed in this encounter.    Procedures: No procedures performed  Clinical Data: No additional findings.  ROS:  All other systems negative, except as noted in the HPI. Review of Systems  Objective: Vital Signs: There were no vitals taken for this  visit.  Specialty Comments:  No specialty comments available.  PMFS History: Patient Active Problem List   Diagnosis Date Noted   Alcohol abuse 12/02/2021   Anxiety 12/02/2021   Right leg pain 12/02/2021   Chest pain 04/14/2021   CAD (coronary artery disease)    Unstable angina (HCC)    Venous insufficiency (chronic) (peripheral)    Ulcer of right foot limited to breakdown of skin (HCC)    Chest pain, rule out acute myocardial infarction 12/23/2020   PAD (peripheral artery disease) 12/23/2020   HLD (hyperlipidemia) 12/23/2020   ACS (acute coronary syndrome) (HCC) 12/01/2019   Non-ST elevation (NSTEMI) myocardial infarction (HCC) 12/01/2019   NSTEMI (non-ST elevated myocardial infarction) (HCC) 12/01/2019   History of transmetatarsal amputation of right foot (HCC)    Subacute osteomyelitis, right ankle and foot (HCC)    Infected blister of foot 08/13/2017   Pressure injury of skin 08/12/2017   Weakness 08/11/2017   COPD (chronic obstructive pulmonary disease) (HCC) 08/11/2017   Cellulitis  of right lower extremity 08/11/2017   Fall at home, initial encounter 08/11/2017   Thrombocytopenia 08/11/2017   Weakness generalized 08/11/2017   Hypokalemia 08/11/2017   Essential hypertension 08/11/2017   Past Medical History:  Diagnosis Date   Abdominal aortic aneurysm    no AAA on US  in 2020   CAD (coronary artery disease)    Stent to RCA 2021   Chronic obstructive pulmonary disease    HLD (hyperlipidemia)    HTN (hypertension)    Peripheral artery disease    s/p R fem pop // s/p R transmet amp    Family History  Adopted: Yes    Past Surgical History:  Procedure Laterality Date   ABDOMINAL AORTOGRAM N/A 08/17/2017   Procedure: ABDOMINAL AORTOGRAM;  Surgeon: Sheree Penne Bruckner, MD;  Location: [facility] INVASIVE CV LAB;  Service: Cardiovascular;  Laterality: N/A;   ABDOMINAL AORTOGRAM N/A 12/26/2023   Procedure: ABDOMINAL AORTOGRAM;  Surgeon: Sheree Penne Bruckner, MD;  Location: [facility] INVASIVE CV LAB;  Service: Cardiovascular;  Laterality: N/A;   ABDOMINAL AORTOGRAM W/LOWER EXTREMITY Right 07/13/2021   Procedure: ABDOMINAL AORTOGRAM W/LOWER EXTREMITY;  Surgeon: Sheree Penne Bruckner, MD;  Location: [facility] INVASIVE CV LAB;  Service: Cardiovascular;  Laterality: Right;   AMPUTATION Right 08/21/2017   Procedure: TRANSMETATARSAL AMPUTATION RIGHT FOOT;  Surgeon: Harden Jerona GAILS, MD;  Location: [facility] OR;  Service: Orthopedics;  Laterality: Right;   AMPUTATION TOE Right 08/18/2017   Procedure: AMPUTATION RIGHT SECOND AND THIRD TOES;  Surgeon: Sheree Penne Bruckner, MD;  Location: [facility] OR;  Service: Vascular;  Laterality: Right;   CORONARY BALLOON ANGIOPLASTY N/A 12/03/2019   Procedure: CORONARY BALLOON ANGIOPLASTY;  Surgeon: Dann Candyce RAMAN, MD;  Location: [facility] INVASIVE CV LAB;  Service: Cardiovascular;  Laterality: N/A;   CORONARY STENT INTERVENTION N/A 12/03/2019   Procedure: CORONARY STENT INTERVENTION;  Surgeon: Dann Candyce RAMAN, MD;  Location: [facility] INVASIVE CV LAB;  Service:  Cardiovascular;  Laterality: N/A;   ENDARTERECTOMY FEMORAL Right 08/18/2017   Procedure: ENDARTERECTOMY RIGHT SUPERFICIAL SCHERYL;  Surgeon: Sheree Penne Bruckner, MD;  Location: [facility] OR;  Service: Vascular;  Laterality: Right;   FEMORAL-POPLITEAL BYPASS GRAFT Right 08/18/2017   Procedure: BYPASS GRAFT RIGHT COMMON FEMORAL TO ABOVE KNEE POPLITEAL ARTERY USING RIGHT REVERSED GREAT SAPHENOUS VEIN;  Surgeon: Sheree Penne Bruckner, MD;  Location: [facility] OR;  Service: Vascular;  Laterality: Right;   LEFT HEART CATH AND CORONARY ANGIOGRAPHY N/A 12/03/2019   Procedure: LEFT HEART CATH AND CORONARY ANGIOGRAPHY;  Surgeon: Dann Candyce RAMAN,  MD;  Location: [facility] INVASIVE CV LAB;  Service: Cardiovascular;  Laterality: N/A;   LEFT HEART CATH AND CORONARY ANGIOGRAPHY N/A 04/14/2021   Procedure: LEFT HEART CATH AND CORONARY ANGIOGRAPHY;  Surgeon: Dann Candyce RAMAN, MD;  Location: [facility] INVASIVE CV LAB;  Service: Cardiovascular;  Laterality: N/A;   LOWER EXTREMITY ANGIOGRAPHY Right 08/17/2017   Procedure: Lower Extremity Angiography;  Surgeon: Sheree Penne Bruckner, MD;  Location: [facility] INVASIVE CV LAB;  Service: Cardiovascular;  Laterality: Right;   LOWER EXTREMITY ANGIOGRAPHY N/A 12/26/2023   Procedure: Lower Extremity Angiography;  Surgeon: Sheree Penne Bruckner, MD;  Location: [facility] INVASIVE CV LAB;  Service: Cardiovascular;  Laterality: N/A;   LOWER EXTREMITY INTERVENTION N/A 12/26/2023   Procedure: LOWER EXTREMITY INTERVENTION;  Surgeon: Sheree Penne Bruckner, MD;  Location: [facility] INVASIVE CV LAB;  Service: Cardiovascular;  Laterality: N/A;   PERIPHERAL INTRAVASCULAR LITHOTRIPSY  12/26/2023   Procedure: PERIPHERAL INTRAVASCULAR LITHOTRIPSY;  Surgeon: Sheree Penne Bruckner, MD;  Location: [facility] INVASIVE CV LAB;  Service: Cardiovascular;;   PERIPHERAL VASCULAR BALLOON ANGIOPLASTY Right 08/17/2017   Procedure: PERIPHERAL VASCULAR BALLOON ANGIOPLASTY;  Surgeon: Sheree Penne Bruckner, MD;  Location: [facility]  INVASIVE CV LAB;  Service: Cardiovascular;  Laterality: Right;  SFA UNABLE TO CROSS   VEIN HARVEST Right 08/18/2017   Procedure: VEIN HARVEST RIGHT GREAT SAPHENOUS;  Surgeon: Sheree Penne Bruckner, MD;  Location: [facility] OR;  Service: Vascular;  Laterality: Right;   WOUND DEBRIDEMENT Right 08/18/2017   Procedure: DEBRIDEMENT WOUND RIGHT FOOT;  Surgeon: Sheree Penne Bruckner, MD;  Location: [facility] OR;  Service: Vascular;  Laterality: Right;   Social History   Occupational History   Not on file  Tobacco Use   Smoking status: Former    Passive exposure: Never   Smokeless tobacco: Never  Vaping Use   Vaping status: Former  Substance and Sexual Activity   Alcohol use: Not Currently   Drug use: No   Sexual activity: Not on file

## 2024-02-06 ENCOUNTER — Ambulatory Visit: Admitting: Orthopedic Surgery

## 2024-02-06 DIAGNOSIS — I87333 Chronic venous hypertension (idiopathic) with ulcer and inflammation of bilateral lower extremity: Secondary | ICD-10-CM | POA: Diagnosis not present

## 2024-02-07 ENCOUNTER — Encounter: Payer: Self-pay | Admitting: Orthopedic Surgery

## 2024-02-07 NOTE — Progress Notes (Signed)
 Office Visit Note   Patient: Jorge Clements           Date of Birth: February 01, 1946           MRN: [id number] Visit Date: 02/06/2024              Requested by: Shona Norleen PEDLAR, MD [address] [place],  [place] [address] PCP: Shona Norleen PEDLAR, MD  Chief Complaint  Patient presents with   Right Foot - Follow-up    Wound check      HPI: Discussed the use of AI scribe software for clinical note transcription with the patient, who gave verbal consent to proceed.  History of Present Illness Jorge Clements is a 78 year old male who presents for follow-up of a healing ulcer on the plantar aspect of his foot.  He notes that his leg looks 'a hundred percent better.'  He has been receiving twice-weekly dressing changes to manage swelling, with the last dressing applied on Thursday. He follows a schedule of visits on Monday and Thursday for these changes.  He uses a compression wrap, last applied on Monday.     Assessment & Plan: Visit Diagnoses: No diagnosis found.  Plan: Assessment and Plan Assessment & Plan Chronic venous insufficiency with healing plantar foot ulcer and lower extremity edema Plantar foot ulcer healing well, minimal swelling, no infection signs, skin shows brawny changes. - Reapply Dynaflex compression wrap. - Advise to keep wrap dry for one week. - Schedule follow-up in one week.      Follow-Up Instructions: No follow-ups on file.   Ortho Exam  Patient is alert, oriented, no adenopathy, well-dressed, normal affect, normal respiratory effort. Physical Exam EXTREMITIES: Decreased swelling in leg and foot. SKIN: No cellulitis or signs of infection. Ulcer on plantar aspect of foot healing, 1 cm diameter, flat. Brawny skin color changes and some swelling.      Imaging: No results found. No images are attached to the encounter.  Labs: Lab Results  Component Value Date   HGBA1C 6.1 (H) 02/28/2023   HGBA1C 6.4 (H) 12/02/2019   ESRSEDRATE 28 (H)  01/18/2021   ESRSEDRATE 40 (H) 12/24/2020   CRP 11.1 (H) 01/18/2021   CRP 1.9 (H) 12/24/2020   REPTSTATUS 12/27/2020 FINAL 12/24/2020   GRAMSTAIN NO WBC SEEN NO ORGANISMS SEEN  12/24/2020   CULT  12/24/2020    FEW STAPHYLOCOCCUS AUREUS WITHIN MIXED ORGANISMS Performed at [facility], [address], [place], [place] [address]    [facility] STAPHYLOCOCCUS AUREUS 12/24/2020     Lab Results  Component Value Date   ALBUMIN 3.8 02/28/2023   ALBUMIN 4.0 03/23/2022   ALBUMIN 3.9 12/01/2019    Lab Results  Component Value Date   MG 2.2 08/17/2017   MG 1.8 08/15/2017   No results found for: VD25OH  No results found for: PREALBUMIN    Latest Ref Rng & Units 12/26/2023    7:51 AM 02/28/2023   12:18 PM 03/23/2022    8:00 PM  CBC EXTENDED  WBC 4.0 - 10.5 K/uL  9.1  9.6   RBC 4.22 - 5.81 MIL/uL  4.34  4.67   Hemoglobin 13.0 - 17.0 g/dL 86.6  86.7  85.9   HCT 39.0 - 52.0 % 39.0  40.0  43.5   Platelets 150 - 400 K/uL  156  132   NEUT# 1.7 - 7.7 K/uL   7.5   Lymph# 0.7 - 4.0 K/uL   1.4  There is no height or weight on file to calculate BMI.  Orders:  No orders of the defined types were placed in this encounter.  No orders of the defined types were placed in this encounter.    Procedures: No procedures performed  Clinical Data: No additional findings.  ROS:  All other systems negative, except as noted in the HPI. Review of Systems  Objective: Vital Signs: There were no vitals taken for this visit.  Specialty Comments:  No specialty comments available.  PMFS History: Patient Active Problem List   Diagnosis Date Noted   Alcohol abuse 12/02/2021   Anxiety 12/02/2021   Right leg pain 12/02/2021   Chest pain 04/14/2021   CAD (coronary artery disease)    Unstable angina (HCC)    Venous insufficiency (chronic) (peripheral)    Ulcer of right foot limited to breakdown of skin (HCC)    Chest pain, rule out acute myocardial infarction 12/23/2020   PAD  (peripheral artery disease) 12/23/2020   HLD (hyperlipidemia) 12/23/2020   ACS (acute coronary syndrome) (HCC) 12/01/2019   Non-ST elevation (NSTEMI) myocardial infarction (HCC) 12/01/2019   NSTEMI (non-ST elevated myocardial infarction) (HCC) 12/01/2019   History of transmetatarsal amputation of right foot (HCC)    Subacute osteomyelitis, right ankle and foot (HCC)    Infected blister of foot 08/13/2017   Pressure injury of skin 08/12/2017   Weakness 08/11/2017   COPD (chronic obstructive pulmonary disease) (HCC) 08/11/2017   Cellulitis of right lower extremity 08/11/2017   Fall at home, initial encounter 08/11/2017   Thrombocytopenia 08/11/2017   Weakness generalized 08/11/2017   Hypokalemia 08/11/2017   Essential hypertension 08/11/2017   Past Medical History:  Diagnosis Date   Abdominal aortic aneurysm    no AAA on US  in 2020   CAD (coronary artery disease)    Stent to RCA 2021   Chronic obstructive pulmonary disease    HLD (hyperlipidemia)    HTN (hypertension)    Peripheral artery disease    s/p R fem pop // s/p R transmet amp    Family History  Adopted: Yes    Past Surgical History:  Procedure Laterality Date   ABDOMINAL AORTOGRAM N/A 08/17/2017   Procedure: ABDOMINAL AORTOGRAM;  Surgeon: Sheree Penne Bruckner, MD;  Location: [facility] INVASIVE CV LAB;  Service: Cardiovascular;  Laterality: N/A;   ABDOMINAL AORTOGRAM N/A 12/26/2023   Procedure: ABDOMINAL AORTOGRAM;  Surgeon: Sheree Penne Bruckner, MD;  Location: [facility] INVASIVE CV LAB;  Service: Cardiovascular;  Laterality: N/A;   ABDOMINAL AORTOGRAM W/LOWER EXTREMITY Right 07/13/2021   Procedure: ABDOMINAL AORTOGRAM W/LOWER EXTREMITY;  Surgeon: Sheree Penne Bruckner, MD;  Location: [facility] INVASIVE CV LAB;  Service: Cardiovascular;  Laterality: Right;   AMPUTATION Right 08/21/2017   Procedure: TRANSMETATARSAL AMPUTATION RIGHT FOOT;  Surgeon: Harden Jerona GAILS, MD;  Location: [facility] OR;  Service: Orthopedics;  Laterality: Right;    AMPUTATION TOE Right 08/18/2017   Procedure: AMPUTATION RIGHT SECOND AND THIRD TOES;  Surgeon: Sheree Penne Bruckner, MD;  Location: [facility] OR;  Service: Vascular;  Laterality: Right;   CORONARY BALLOON ANGIOPLASTY N/A 12/03/2019   Procedure: CORONARY BALLOON ANGIOPLASTY;  Surgeon: Dann Candyce RAMAN, MD;  Location: [facility] INVASIVE CV LAB;  Service: Cardiovascular;  Laterality: N/A;   CORONARY STENT INTERVENTION N/A 12/03/2019   Procedure: CORONARY STENT INTERVENTION;  Surgeon: Dann Candyce RAMAN, MD;  Location: [facility] INVASIVE CV LAB;  Service: Cardiovascular;  Laterality: N/A;   ENDARTERECTOMY FEMORAL Right 08/18/2017   Procedure: ENDARTERECTOMY RIGHT SUPERFICIAL SCHERYL;  Surgeon: Sheree Penne Bruckner,  MD;  Location: [facility] OR;  Service: Vascular;  Laterality: Right;   FEMORAL-POPLITEAL BYPASS GRAFT Right 08/18/2017   Procedure: BYPASS GRAFT RIGHT COMMON FEMORAL TO ABOVE KNEE POPLITEAL ARTERY USING RIGHT REVERSED GREAT SAPHENOUS VEIN;  Surgeon: Sheree Penne Bruckner, MD;  Location: [facility] OR;  Service: Vascular;  Laterality: Right;   LEFT HEART CATH AND CORONARY ANGIOGRAPHY N/A 12/03/2019   Procedure: LEFT HEART CATH AND CORONARY ANGIOGRAPHY;  Surgeon: Dann Candyce RAMAN, MD;  Location: [facility] INVASIVE CV LAB;  Service: Cardiovascular;  Laterality: N/A;   LEFT HEART CATH AND CORONARY ANGIOGRAPHY N/A 04/14/2021   Procedure: LEFT HEART CATH AND CORONARY ANGIOGRAPHY;  Surgeon: Dann Candyce RAMAN, MD;  Location: [facility] INVASIVE CV LAB;  Service: Cardiovascular;  Laterality: N/A;   LOWER EXTREMITY ANGIOGRAPHY Right 08/17/2017   Procedure: Lower Extremity Angiography;  Surgeon: Sheree Penne Bruckner, MD;  Location: [facility] INVASIVE CV LAB;  Service: Cardiovascular;  Laterality: Right;   LOWER EXTREMITY ANGIOGRAPHY N/A 12/26/2023   Procedure: Lower Extremity Angiography;  Surgeon: Sheree Penne Bruckner, MD;  Location: [facility] INVASIVE CV LAB;  Service: Cardiovascular;  Laterality: N/A;   LOWER EXTREMITY  INTERVENTION N/A 12/26/2023   Procedure: LOWER EXTREMITY INTERVENTION;  Surgeon: Sheree Penne Bruckner, MD;  Location: [facility] INVASIVE CV LAB;  Service: Cardiovascular;  Laterality: N/A;   PERIPHERAL INTRAVASCULAR LITHOTRIPSY  12/26/2023   Procedure: PERIPHERAL INTRAVASCULAR LITHOTRIPSY;  Surgeon: Sheree Penne Bruckner, MD;  Location: [facility] INVASIVE CV LAB;  Service: Cardiovascular;;   PERIPHERAL VASCULAR BALLOON ANGIOPLASTY Right 08/17/2017   Procedure: PERIPHERAL VASCULAR BALLOON ANGIOPLASTY;  Surgeon: Sheree Penne Bruckner, MD;  Location: [facility] INVASIVE CV LAB;  Service: Cardiovascular;  Laterality: Right;  SFA UNABLE TO CROSS   VEIN HARVEST Right 08/18/2017   Procedure: VEIN HARVEST RIGHT GREAT SAPHENOUS;  Surgeon: Sheree Penne Bruckner, MD;  Location: [facility] OR;  Service: Vascular;  Laterality: Right;   WOUND DEBRIDEMENT Right 08/18/2017   Procedure: DEBRIDEMENT WOUND RIGHT FOOT;  Surgeon: Sheree Penne Bruckner, MD;  Location: [facility] OR;  Service: Vascular;  Laterality: Right;   Social History   Occupational History   Not on file  Tobacco Use   Smoking status: Former    Passive exposure: Never   Smokeless tobacco: Never  Vaping Use   Vaping status: Former  Substance and Sexual Activity   Alcohol use: Not Currently   Drug use: No   Sexual activity: Not on file

## 2024-02-13 ENCOUNTER — Ambulatory Visit: Admitting: Orthopedic Surgery

## 2024-02-13 DIAGNOSIS — Z89431 Acquired absence of right foot: Secondary | ICD-10-CM

## 2024-02-13 DIAGNOSIS — I87333 Chronic venous hypertension (idiopathic) with ulcer and inflammation of bilateral lower extremity: Secondary | ICD-10-CM | POA: Diagnosis not present

## 2024-02-13 DIAGNOSIS — L97511 Non-pressure chronic ulcer of other part of right foot limited to breakdown of skin: Secondary | ICD-10-CM | POA: Diagnosis not present

## 2024-02-14 ENCOUNTER — Encounter: Payer: Self-pay | Admitting: Orthopedic Surgery

## 2024-02-14 NOTE — Progress Notes (Signed)
 Office Visit Note   Patient: Jorge Clements           Date of Birth: 1945-07-23           MRN: [id number] Visit Date: 02/13/2024              Requested by: Shona Norleen PEDLAR, MD [address] [place],  [place] [address] PCP: Shona Norleen PEDLAR, MD  Chief Complaint  Patient presents with   Right Foot - Follow-up      HPI: Discussed the use of AI scribe software for clinical note transcription with the patient, who gave verbal consent to proceed.  History of Present Illness Jorge Clements is a 78 year old male who presents for follow-up of ulcers on his ankle and foot.  He states that the ulcers are 'a lot better than what it used to be.'  He mentions that there is still some bleeding.  He has been receiving treatment with multilayer compression wraps.     Assessment & Plan: Visit Diagnoses:  1. Chronic venous hypertension (idiopathic) with ulcer and inflammation of bilateral lower extremity (HCC)   2. History of transmetatarsal amputation of right foot (HCC)   3. Non-pressure chronic ulcer of other part of right foot limited to breakdown of skin (HCC)     Plan: Assessment and Plan Assessment & Plan Healing chronic ulcers of left lower extremity with resolving edema Chronic ulcers on the left lower extremity are healing with decreased swelling and no cellulitis. - Reapply multilayer compression wrap. - Follow up in one week. - Anticipate starting compression socks.      Follow-Up Instructions: Return in about 1 week (around 02/20/2024).   Ortho Exam  Patient is alert, oriented, no adenopathy, well-dressed, normal affect, normal respiratory effort. Physical Exam EXTREMITIES: Swelling in the leg is resolving. Healing of ulcers in the ankle, hind foot, and leg. Ulcers over transmetatarsal amputation are smaller. One ulcer is 5mm in diameter, another is 10mm in diameter. Ulcers are flat with no drainage. Decreased swelling and no cellulitis.      Imaging: No results  found. No images are attached to the encounter.  Labs: Lab Results  Component Value Date   HGBA1C 6.1 (H) 02/28/2023   HGBA1C 6.4 (H) 12/02/2019   ESRSEDRATE 28 (H) 01/18/2021   ESRSEDRATE 40 (H) 12/24/2020   CRP 11.1 (H) 01/18/2021   CRP 1.9 (H) 12/24/2020   REPTSTATUS 12/27/2020 FINAL 12/24/2020   GRAMSTAIN NO WBC SEEN NO ORGANISMS SEEN  12/24/2020   CULT  12/24/2020    FEW STAPHYLOCOCCUS AUREUS WITHIN MIXED ORGANISMS Performed at [facility], [address], [place], [place] [address]    [facility] STAPHYLOCOCCUS AUREUS 12/24/2020     Lab Results  Component Value Date   ALBUMIN 3.8 02/28/2023   ALBUMIN 4.0 03/23/2022   ALBUMIN 3.9 12/01/2019    Lab Results  Component Value Date   MG 2.2 08/17/2017   MG 1.8 08/15/2017   No results found for: VD25OH  No results found for: PREALBUMIN    Latest Ref Rng & Units 12/26/2023    7:51 AM 02/28/2023   12:18 PM 03/23/2022    8:00 PM  CBC EXTENDED  WBC 4.0 - 10.5 K/uL  9.1  9.6   RBC 4.22 - 5.81 MIL/uL  4.34  4.67   Hemoglobin 13.0 - 17.0 g/dL 86.6  86.7  85.9   HCT 39.0 - 52.0 % 39.0  40.0  43.5   Platelets 150 - 400  K/uL  156  132   NEUT# 1.7 - 7.7 K/uL   7.5   Lymph# 0.7 - 4.0 K/uL   1.4      There is no height or weight on file to calculate BMI.  Orders:  No orders of the defined types were placed in this encounter.  No orders of the defined types were placed in this encounter.    Procedures: No procedures performed  Clinical Data: No additional findings.  ROS:  All other systems negative, except as noted in the HPI. Review of Systems  Objective: Vital Signs: There were no vitals taken for this visit.  Specialty Comments:  No specialty comments available.  PMFS History: Patient Active Problem List   Diagnosis Date Noted   Alcohol abuse 12/02/2021   Anxiety 12/02/2021   Right leg pain 12/02/2021   Chest pain 04/14/2021   CAD (coronary artery disease)    Unstable angina (HCC)     Venous insufficiency (chronic) (peripheral)    Ulcer of right foot limited to breakdown of skin (HCC)    Chest pain, rule out acute myocardial infarction 12/23/2020   PAD (peripheral artery disease) 12/23/2020   HLD (hyperlipidemia) 12/23/2020   ACS (acute coronary syndrome) (HCC) 12/01/2019   Non-ST elevation (NSTEMI) myocardial infarction (HCC) 12/01/2019   NSTEMI (non-ST elevated myocardial infarction) (HCC) 12/01/2019   History of transmetatarsal amputation of right foot (HCC)    Subacute osteomyelitis, right ankle and foot (HCC)    Infected blister of foot 08/13/2017   Pressure injury of skin 08/12/2017   Weakness 08/11/2017   COPD (chronic obstructive pulmonary disease) (HCC) 08/11/2017   Cellulitis of right lower extremity 08/11/2017   Fall at home, initial encounter 08/11/2017   Thrombocytopenia 08/11/2017   Weakness generalized 08/11/2017   Hypokalemia 08/11/2017   Essential hypertension 08/11/2017   Past Medical History:  Diagnosis Date   Abdominal aortic aneurysm    no AAA on US  in 2020   CAD (coronary artery disease)    Stent to RCA 2021   Chronic obstructive pulmonary disease    HLD (hyperlipidemia)    HTN (hypertension)    Peripheral artery disease    s/p R fem pop // s/p R transmet amp    Family History  Adopted: Yes    Past Surgical History:  Procedure Laterality Date   ABDOMINAL AORTOGRAM N/A 08/17/2017   Procedure: ABDOMINAL AORTOGRAM;  Surgeon: Sheree Penne Bruckner, MD;  Location: [facility] INVASIVE CV LAB;  Service: Cardiovascular;  Laterality: N/A;   ABDOMINAL AORTOGRAM N/A 12/26/2023   Procedure: ABDOMINAL AORTOGRAM;  Surgeon: Sheree Penne Bruckner, MD;  Location: [facility] INVASIVE CV LAB;  Service: Cardiovascular;  Laterality: N/A;   ABDOMINAL AORTOGRAM W/LOWER EXTREMITY Right 07/13/2021   Procedure: ABDOMINAL AORTOGRAM W/LOWER EXTREMITY;  Surgeon: Sheree Penne Bruckner, MD;  Location: [facility] INVASIVE CV LAB;  Service: Cardiovascular;  Laterality: Right;    AMPUTATION Right 08/21/2017   Procedure: TRANSMETATARSAL AMPUTATION RIGHT FOOT;  Surgeon: Harden Jerona GAILS, MD;  Location: [facility] OR;  Service: Orthopedics;  Laterality: Right;   AMPUTATION TOE Right 08/18/2017   Procedure: AMPUTATION RIGHT SECOND AND THIRD TOES;  Surgeon: Sheree Penne Bruckner, MD;  Location: [facility] OR;  Service: Vascular;  Laterality: Right;   CORONARY BALLOON ANGIOPLASTY N/A 12/03/2019   Procedure: CORONARY BALLOON ANGIOPLASTY;  Surgeon: Dann Candyce RAMAN, MD;  Location: [facility] INVASIVE CV LAB;  Service: Cardiovascular;  Laterality: N/A;   CORONARY STENT INTERVENTION N/A 12/03/2019   Procedure: CORONARY STENT INTERVENTION;  Surgeon: Dann Candyce RAMAN, MD;  Location: [facility] INVASIVE CV LAB;  Service: Cardiovascular;  Laterality: N/A;   ENDARTERECTOMY FEMORAL Right 08/18/2017   Procedure: ENDARTERECTOMY RIGHT SUPERFICIAL SCHERYL;  Surgeon: Sheree Penne Bruckner, MD;  Location: [facility] OR;  Service: Vascular;  Laterality: Right;   FEMORAL-POPLITEAL BYPASS GRAFT Right 08/18/2017   Procedure: BYPASS GRAFT RIGHT COMMON FEMORAL TO ABOVE KNEE POPLITEAL ARTERY USING RIGHT REVERSED GREAT SAPHENOUS VEIN;  Surgeon: Sheree Penne Bruckner, MD;  Location: [facility] OR;  Service: Vascular;  Laterality: Right;   LEFT HEART CATH AND CORONARY ANGIOGRAPHY N/A 12/03/2019   Procedure: LEFT HEART CATH AND CORONARY ANGIOGRAPHY;  Surgeon: Dann Candyce RAMAN, MD;  Location: [facility] INVASIVE CV LAB;  Service: Cardiovascular;  Laterality: N/A;   LEFT HEART CATH AND CORONARY ANGIOGRAPHY N/A 04/14/2021   Procedure: LEFT HEART CATH AND CORONARY ANGIOGRAPHY;  Surgeon: Dann Candyce RAMAN, MD;  Location: [facility] INVASIVE CV LAB;  Service: Cardiovascular;  Laterality: N/A;   LOWER EXTREMITY ANGIOGRAPHY Right 08/17/2017   Procedure: Lower Extremity Angiography;  Surgeon: Sheree Penne Bruckner, MD;  Location: [facility] INVASIVE CV LAB;  Service: Cardiovascular;  Laterality: Right;   LOWER EXTREMITY ANGIOGRAPHY N/A 12/26/2023   Procedure:  Lower Extremity Angiography;  Surgeon: Sheree Penne Bruckner, MD;  Location: [facility] INVASIVE CV LAB;  Service: Cardiovascular;  Laterality: N/A;   LOWER EXTREMITY INTERVENTION N/A 12/26/2023   Procedure: LOWER EXTREMITY INTERVENTION;  Surgeon: Sheree Penne Bruckner, MD;  Location: [facility] INVASIVE CV LAB;  Service: Cardiovascular;  Laterality: N/A;   PERIPHERAL INTRAVASCULAR LITHOTRIPSY  12/26/2023   Procedure: PERIPHERAL INTRAVASCULAR LITHOTRIPSY;  Surgeon: Sheree Penne Bruckner, MD;  Location: [facility] INVASIVE CV LAB;  Service: Cardiovascular;;   PERIPHERAL VASCULAR BALLOON ANGIOPLASTY Right 08/17/2017   Procedure: PERIPHERAL VASCULAR BALLOON ANGIOPLASTY;  Surgeon: Sheree Penne Bruckner, MD;  Location: [facility] INVASIVE CV LAB;  Service: Cardiovascular;  Laterality: Right;  SFA UNABLE TO CROSS   VEIN HARVEST Right 08/18/2017   Procedure: VEIN HARVEST RIGHT GREAT SAPHENOUS;  Surgeon: Sheree Penne Bruckner, MD;  Location: [facility] OR;  Service: Vascular;  Laterality: Right;   WOUND DEBRIDEMENT Right 08/18/2017   Procedure: DEBRIDEMENT WOUND RIGHT FOOT;  Surgeon: Sheree Penne Bruckner, MD;  Location: [facility] OR;  Service: Vascular;  Laterality: Right;   Social History   Occupational History   Not on file  Tobacco Use   Smoking status: Former    Passive exposure: Never   Smokeless tobacco: Never  Vaping Use   Vaping status: Former  Substance and Sexual Activity   Alcohol use: Not Currently   Drug use: No   Sexual activity: Not on file

## 2024-02-20 ENCOUNTER — Ambulatory Visit: Admitting: Orthopedic Surgery

## 2024-02-20 ENCOUNTER — Encounter: Payer: Self-pay | Admitting: Radiology

## 2024-02-20 DIAGNOSIS — I87333 Chronic venous hypertension (idiopathic) with ulcer and inflammation of bilateral lower extremity: Secondary | ICD-10-CM

## 2024-02-20 DIAGNOSIS — Z89431 Acquired absence of right foot: Secondary | ICD-10-CM

## 2024-02-20 DIAGNOSIS — L97511 Non-pressure chronic ulcer of other part of right foot limited to breakdown of skin: Secondary | ICD-10-CM

## 2024-02-22 ENCOUNTER — Encounter: Payer: Self-pay | Admitting: Orthopedic Surgery

## 2024-02-22 NOTE — Progress Notes (Signed)
 Office Visit Note   Patient: Jorge Clements           Date of Birth: December 06, 1945           MRN: [id number] Visit Date: 02/20/2024              Requested by: Shona Norleen PEDLAR, MD [address] [place],  [place] [address] PCP: Shona Norleen PEDLAR, MD  Chief Complaint  Patient presents with   Right Foot - Follow-up      HPI: Discussed the use of AI scribe software for clinical note transcription with the patient, who gave verbal consent to proceed.  History of Present Illness Jorge Clements is a 78 year old male with venous insufficiency who presents for follow-up of ulcerations.  He has a long-standing history of using compression socks for ten years to manage venous insufficiency. Despite these measures, he experiences a sensation of shakiness when not using his cane.  He has a history of a residual transmetatarsal amputation, which is relevant to the current ulceration.     Assessment & Plan: Visit Diagnoses:  1. Chronic venous hypertension (idiopathic) with ulcer and inflammation of bilateral lower extremity (HCC)   2. History of transmetatarsal amputation of right foot (HCC)   3. Non-pressure chronic ulcer of other part of right foot limited to breakdown of skin (HCC)     Plan: Assessment and Plan Assessment & Plan Healed venous insufficiency ulcer with residual granulation tissue at transmetatarsal amputation site Venous insufficiency ulcer healed with reduced swelling. Small granulation tissue present, no drainage, cellulitis, or tenderness. - Touched granulation tissue with silver nitrate. - Apply soap and water daily. - Apply moisturizing lotion. - Continue compression socks. - Applied 4x4 gauze and ACE wrap.  Residual transmetatarsal amputation of foot Amputation site stable, no infection or complications. - Continue management with compression socks and skin care.      Follow-Up Instructions: No follow-ups on file.   Ortho Exam  Patient is alert, oriented, no  adenopathy, well-dressed, normal affect, normal respiratory effort. Physical Exam EXTREMITIES: Venous insufficiency ulcerations healed. Significant decreased swelling in foot. Small area of granulation tissue on plantar surface. No drainage. No cellulitis, no tenderness to palpation.      Imaging: No results found. No images are attached to the encounter.  Labs: Lab Results  Component Value Date   HGBA1C 6.1 (H) 02/28/2023   HGBA1C 6.4 (H) 12/02/2019   ESRSEDRATE 28 (H) 01/18/2021   ESRSEDRATE 40 (H) 12/24/2020   CRP 11.1 (H) 01/18/2021   CRP 1.9 (H) 12/24/2020   REPTSTATUS 12/27/2020 FINAL 12/24/2020   GRAMSTAIN NO WBC SEEN NO ORGANISMS SEEN  12/24/2020   CULT  12/24/2020    FEW STAPHYLOCOCCUS AUREUS WITHIN MIXED ORGANISMS Performed at [facility], [address] [place], [place] [address]    [facility] STAPHYLOCOCCUS AUREUS 12/24/2020     Lab Results  Component Value Date   ALBUMIN 3.8 02/28/2023   ALBUMIN 4.0 03/23/2022   ALBUMIN 3.9 12/01/2019    Lab Results  Component Value Date   MG 2.2 08/17/2017   MG 1.8 08/15/2017   No results found for: VD25OH  No results found for: PREALBUMIN    Latest Ref Rng & Units 12/26/2023    7:51 AM 02/28/2023   12:18 PM 03/23/2022    8:00 PM  CBC EXTENDED  WBC 4.0 - 10.5 K/uL  9.1  9.6   RBC 4.22 - 5.81 MIL/uL  4.34  4.67   Hemoglobin 13.0 -  17.0 g/dL 86.6  86.7  85.9   HCT 39.0 - 52.0 % 39.0  40.0  43.5   Platelets 150 - 400 K/uL  156  132   NEUT# 1.7 - 7.7 K/uL   7.5   Lymph# 0.7 - 4.0 K/uL   1.4      There is no height or weight on file to calculate BMI.  Orders:  No orders of the defined types were placed in this encounter.  No orders of the defined types were placed in this encounter.    Procedures: No procedures performed  Clinical Data: No additional findings.  ROS:  All other systems negative, except as noted in the HPI. Review of Systems  Objective: Vital Signs: There were no vitals  taken for this visit.  Specialty Comments:  No specialty comments available.  PMFS History: Patient Active Problem List   Diagnosis Date Noted   Alcohol abuse 12/02/2021   Anxiety 12/02/2021   Right leg pain 12/02/2021   Chest pain 04/14/2021   CAD (coronary artery disease)    Unstable angina (HCC)    Venous insufficiency (chronic) (peripheral)    Ulcer of right foot limited to breakdown of skin (HCC)    Chest pain, rule out acute myocardial infarction 12/23/2020   PAD (peripheral artery disease) 12/23/2020   HLD (hyperlipidemia) 12/23/2020   ACS (acute coronary syndrome) (HCC) 12/01/2019   Non-ST elevation (NSTEMI) myocardial infarction (HCC) 12/01/2019   NSTEMI (non-ST elevated myocardial infarction) (HCC) 12/01/2019   History of transmetatarsal amputation of right foot (HCC)    Subacute osteomyelitis, right ankle and foot (HCC)    Infected blister of foot 08/13/2017   Pressure injury of skin 08/12/2017   Weakness 08/11/2017   COPD (chronic obstructive pulmonary disease) (HCC) 08/11/2017   Cellulitis of right lower extremity 08/11/2017   Fall at home, initial encounter 08/11/2017   Thrombocytopenia 08/11/2017   Weakness generalized 08/11/2017   Hypokalemia 08/11/2017   Essential hypertension 08/11/2017   Past Medical History:  Diagnosis Date   Abdominal aortic aneurysm    no AAA on US  in 2020   CAD (coronary artery disease)    Stent to RCA 2021   Chronic obstructive pulmonary disease    HLD (hyperlipidemia)    HTN (hypertension)    Peripheral artery disease    s/p R fem pop // s/p R transmet amp    Family History  Adopted: Yes    Past Surgical History:  Procedure Laterality Date   ABDOMINAL AORTOGRAM N/A 08/17/2017   Procedure: ABDOMINAL AORTOGRAM;  Surgeon: Sheree Penne Bruckner, MD;  Location: [facility] INVASIVE CV LAB;  Service: Cardiovascular;  Laterality: N/A;   ABDOMINAL AORTOGRAM N/A 12/26/2023   Procedure: ABDOMINAL AORTOGRAM;  Surgeon: Sheree Penne Bruckner, MD;  Location: [facility] INVASIVE CV LAB;  Service: Cardiovascular;  Laterality: N/A;   ABDOMINAL AORTOGRAM W/LOWER EXTREMITY Right 07/13/2021   Procedure: ABDOMINAL AORTOGRAM W/LOWER EXTREMITY;  Surgeon: Sheree Penne Bruckner, MD;  Location: [facility] INVASIVE CV LAB;  Service: Cardiovascular;  Laterality: Right;   AMPUTATION Right 08/21/2017   Procedure: TRANSMETATARSAL AMPUTATION RIGHT FOOT;  Surgeon: Harden Jerona GAILS, MD;  Location: [facility] OR;  Service: Orthopedics;  Laterality: Right;   AMPUTATION TOE Right 08/18/2017   Procedure: AMPUTATION RIGHT SECOND AND THIRD TOES;  Surgeon: Sheree Penne Bruckner, MD;  Location: [facility] OR;  Service: Vascular;  Laterality: Right;   CORONARY BALLOON ANGIOPLASTY N/A 12/03/2019   Procedure: CORONARY BALLOON ANGIOPLASTY;  Surgeon: Dann Candyce RAMAN, MD;  Location: [facility] INVASIVE CV LAB;  Service: Cardiovascular;  Laterality: N/A;   CORONARY STENT INTERVENTION N/A 12/03/2019   Procedure: CORONARY STENT INTERVENTION;  Surgeon: Dann Candyce RAMAN, MD;  Location: [facility] INVASIVE CV LAB;  Service: Cardiovascular;  Laterality: N/A;   ENDARTERECTOMY FEMORAL Right 08/18/2017   Procedure: ENDARTERECTOMY RIGHT SUPERFICIAL SCHERYL;  Surgeon: Sheree Penne Bruckner, MD;  Location: [facility] OR;  Service: Vascular;  Laterality: Right;   FEMORAL-POPLITEAL BYPASS GRAFT Right 08/18/2017   Procedure: BYPASS GRAFT RIGHT COMMON FEMORAL TO ABOVE KNEE POPLITEAL ARTERY USING RIGHT REVERSED GREAT SAPHENOUS VEIN;  Surgeon: Sheree Penne Bruckner, MD;  Location: [facility] OR;  Service: Vascular;  Laterality: Right;   LEFT HEART CATH AND CORONARY ANGIOGRAPHY N/A 12/03/2019   Procedure: LEFT HEART CATH AND CORONARY ANGIOGRAPHY;  Surgeon: Dann Candyce RAMAN, MD;  Location: [facility] INVASIVE CV LAB;  Service: Cardiovascular;  Laterality: N/A;   LEFT HEART CATH AND CORONARY ANGIOGRAPHY N/A 04/14/2021   Procedure: LEFT HEART CATH AND CORONARY ANGIOGRAPHY;  Surgeon: Dann Candyce RAMAN, MD;  Location: [facility]  INVASIVE CV LAB;  Service: Cardiovascular;  Laterality: N/A;   LOWER EXTREMITY ANGIOGRAPHY Right 08/17/2017   Procedure: Lower Extremity Angiography;  Surgeon: Sheree Penne Bruckner, MD;  Location: [facility] INVASIVE CV LAB;  Service: Cardiovascular;  Laterality: Right;   LOWER EXTREMITY ANGIOGRAPHY N/A 12/26/2023   Procedure: Lower Extremity Angiography;  Surgeon: Sheree Penne Bruckner, MD;  Location: [facility] INVASIVE CV LAB;  Service: Cardiovascular;  Laterality: N/A;   LOWER EXTREMITY INTERVENTION N/A 12/26/2023   Procedure: LOWER EXTREMITY INTERVENTION;  Surgeon: Sheree Penne Bruckner, MD;  Location: [facility] INVASIVE CV LAB;  Service: Cardiovascular;  Laterality: N/A;   PERIPHERAL INTRAVASCULAR LITHOTRIPSY  12/26/2023   Procedure: PERIPHERAL INTRAVASCULAR LITHOTRIPSY;  Surgeon: Sheree Penne Bruckner, MD;  Location: [facility] INVASIVE CV LAB;  Service: Cardiovascular;;   PERIPHERAL VASCULAR BALLOON ANGIOPLASTY Right 08/17/2017   Procedure: PERIPHERAL VASCULAR BALLOON ANGIOPLASTY;  Surgeon: Sheree Penne Bruckner, MD;  Location: [facility] INVASIVE CV LAB;  Service: Cardiovascular;  Laterality: Right;  SFA UNABLE TO CROSS   VEIN HARVEST Right 08/18/2017   Procedure: VEIN HARVEST RIGHT GREAT SAPHENOUS;  Surgeon: Sheree Penne Bruckner, MD;  Location: [facility] OR;  Service: Vascular;  Laterality: Right;   WOUND DEBRIDEMENT Right 08/18/2017   Procedure: DEBRIDEMENT WOUND RIGHT FOOT;  Surgeon: Sheree Penne Bruckner, MD;  Location: [facility] OR;  Service: Vascular;  Laterality: Right;   Social History   Occupational History   Not on file  Tobacco Use   Smoking status: Former    Passive exposure: Never   Smokeless tobacco: Never  Vaping Use   Vaping status: Former  Substance and Sexual Activity   Alcohol use: Not Currently   Drug use: No   Sexual activity: Not on file

## 2024-02-24 DIAGNOSIS — E1169 Type 2 diabetes mellitus with other specified complication: Secondary | ICD-10-CM | POA: Diagnosis not present

## 2024-03-01 ENCOUNTER — Encounter: Payer: Self-pay | Admitting: Internal Medicine

## 2024-03-01 DIAGNOSIS — I2510 Atherosclerotic heart disease of native coronary artery without angina pectoris: Secondary | ICD-10-CM | POA: Diagnosis not present

## 2024-03-01 DIAGNOSIS — E1169 Type 2 diabetes mellitus with other specified complication: Secondary | ICD-10-CM | POA: Diagnosis not present

## 2024-03-01 DIAGNOSIS — I739 Peripheral vascular disease, unspecified: Secondary | ICD-10-CM | POA: Diagnosis not present

## 2024-03-01 DIAGNOSIS — J449 Chronic obstructive pulmonary disease, unspecified: Secondary | ICD-10-CM | POA: Diagnosis not present

## 2024-03-01 DIAGNOSIS — I10 Essential (primary) hypertension: Secondary | ICD-10-CM | POA: Diagnosis not present

## 2024-03-01 DIAGNOSIS — F419 Anxiety disorder, unspecified: Secondary | ICD-10-CM | POA: Diagnosis not present

## 2024-03-01 DIAGNOSIS — I252 Old myocardial infarction: Secondary | ICD-10-CM | POA: Diagnosis not present

## 2024-03-01 DIAGNOSIS — Z0000 Encounter for general adult medical examination without abnormal findings: Secondary | ICD-10-CM | POA: Diagnosis not present

## 2024-03-01 DIAGNOSIS — Z23 Encounter for immunization: Secondary | ICD-10-CM | POA: Diagnosis not present

## 2024-03-01 DIAGNOSIS — Z5320 Procedure and treatment not carried out because of patient's decision for unspecified reasons: Secondary | ICD-10-CM | POA: Diagnosis not present

## 2024-03-01 DIAGNOSIS — Z0001 Encounter for general adult medical examination with abnormal findings: Secondary | ICD-10-CM | POA: Diagnosis not present

## 2024-03-01 DIAGNOSIS — E785 Hyperlipidemia, unspecified: Secondary | ICD-10-CM | POA: Diagnosis not present

## 2024-03-14 ENCOUNTER — Ambulatory Visit (HOSPITAL_COMMUNITY)
Admission: RE | Admit: 2024-03-14 | Discharge: 2024-03-14 | Disposition: A | Discharge status: Home / Self Care | Source: Ambulatory Visit | Attending: Vascular Surgery | Admitting: Vascular Surgery

## 2024-03-14 ENCOUNTER — Encounter: Payer: Self-pay | Admitting: Physician Assistant

## 2024-03-14 ENCOUNTER — Ambulatory Visit (INDEPENDENT_AMBULATORY_CARE_PROVIDER_SITE_OTHER): Admitting: Physician Assistant

## 2024-03-14 ENCOUNTER — Ambulatory Visit (HOSPITAL_BASED_OUTPATIENT_CLINIC_OR_DEPARTMENT_OTHER)
Admission: RE | Admit: 2024-03-14 | Discharge: 2024-03-14 | Disposition: A | Discharge status: Home / Self Care | Source: Ambulatory Visit | Attending: Vascular Surgery | Admitting: Vascular Surgery

## 2024-03-14 VITALS — BP 166/71 | HR 67 | Temp 97.7°F | Wt 247.5 lb

## 2024-03-14 DIAGNOSIS — I739 Peripheral vascular disease, unspecified: Secondary | ICD-10-CM

## 2024-03-14 DIAGNOSIS — I70735 Atherosclerosis of other type of bypass graft(s) of the right leg with ulceration of other part of foot: Secondary | ICD-10-CM | POA: Diagnosis not present

## 2024-03-14 LAB — VAS US ABI WITH/WO TBI
Left ABI: 0.61
Right ABI: 0.71

## 2024-03-14 NOTE — Progress Notes (Signed)
 HISTORY AND PHYSICAL     CC:  follow up. Requesting Provider:  Shona Norleen PEDLAR, MD  HPI: This is a 78 y.o. male who is here today for follow up for PAD.  Pt has hx of right femoral to above knee bypass with vein on 08/18/2017 by Dr. Sheree.  He subsequently underwent a TMA on the right by Dr. Harden 08/21/2017.  On 12/26/2023, he underwent shockwave lithotripsy of right CFA by Dr. Sheree for inflow stenosis and persistent right multifactorial foot wound.   The pt has longstanding venous insufficiency and was being treated for ulceration.    Pt was seen by Dr. Harden on 02/20/2024 and the venous ulcer had healed but did have some granulation tissue that he treated with silver nitrate.  His residual TMA was stable without infection or complications.  He was advised to continue compression socks and skin care.   The pt returns today for follow up.  He denies any rest pain.  He states that his foot looks much better. He is compliant with his plavix  and statin.    The pt is on a statin for cholesterol management.    The pt is not on an aspirin .    Other AC:  Plavix  The pt is on ARB, BB for hypertension.  The pt is not on diabetic medication. Tobacco hx:  former  Pt does not know family hx as he is adopted.   Past Medical History:  Diagnosis Date   Abdominal aortic aneurysm    no AAA on US  in 2020   CAD (coronary artery disease)    Stent to RCA 2021   Chronic obstructive pulmonary disease    HLD (hyperlipidemia)    HTN (hypertension)    Peripheral artery disease    s/p R fem pop // s/p R transmet amp    Past Surgical History:  Procedure Laterality Date   ABDOMINAL AORTOGRAM N/A 08/17/2017   Procedure: ABDOMINAL AORTOGRAM;  Surgeon: Sheree Penne Bruckner, MD;  Location: [facility] INVASIVE CV LAB;  Service: Cardiovascular;  Laterality: N/A;   ABDOMINAL AORTOGRAM N/A 12/26/2023   Procedure: ABDOMINAL AORTOGRAM;  Surgeon: Sheree Penne Bruckner, MD;  Location: [facility] INVASIVE CV LAB;  Service: Cardiovascular;   Laterality: N/A;   ABDOMINAL AORTOGRAM W/LOWER EXTREMITY Right 07/13/2021   Procedure: ABDOMINAL AORTOGRAM W/LOWER EXTREMITY;  Surgeon: Sheree Penne Bruckner, MD;  Location: [facility] INVASIVE CV LAB;  Service: Cardiovascular;  Laterality: Right;   AMPUTATION Right 08/21/2017   Procedure: TRANSMETATARSAL AMPUTATION RIGHT FOOT;  Surgeon: Harden Jerona GAILS, MD;  Location: [facility] OR;  Service: Orthopedics;  Laterality: Right;   AMPUTATION TOE Right 08/18/2017   Procedure: AMPUTATION RIGHT SECOND AND THIRD TOES;  Surgeon: Sheree Penne Bruckner, MD;  Location: [facility] OR;  Service: Vascular;  Laterality: Right;   CORONARY BALLOON ANGIOPLASTY N/A 12/03/2019   Procedure: CORONARY BALLOON ANGIOPLASTY;  Surgeon: Dann Candyce RAMAN, MD;  Location: [facility] INVASIVE CV LAB;  Service: Cardiovascular;  Laterality: N/A;   CORONARY STENT INTERVENTION N/A 12/03/2019   Procedure: CORONARY STENT INTERVENTION;  Surgeon: Dann Candyce RAMAN, MD;  Location: [facility] INVASIVE CV LAB;  Service: Cardiovascular;  Laterality: N/A;   ENDARTERECTOMY FEMORAL Right 08/18/2017   Procedure: ENDARTERECTOMY RIGHT SUPERFICIAL SCHERYL;  Surgeon: Sheree Penne Bruckner, MD;  Location: [facility] OR;  Service: Vascular;  Laterality: Right;   FEMORAL-POPLITEAL BYPASS GRAFT Right 08/18/2017   Procedure: BYPASS GRAFT RIGHT COMMON FEMORAL TO ABOVE KNEE POPLITEAL ARTERY USING RIGHT REVERSED GREAT SAPHENOUS VEIN;  Surgeon: Sheree Penne Bruckner, MD;  Location: [facility]  OR;  Service: Vascular;  Laterality: Right;   LEFT HEART CATH AND CORONARY ANGIOGRAPHY N/A 12/03/2019   Procedure: LEFT HEART CATH AND CORONARY ANGIOGRAPHY;  Surgeon: Dann Candyce RAMAN, MD;  Location: [facility] INVASIVE CV LAB;  Service: Cardiovascular;  Laterality: N/A;   LEFT HEART CATH AND CORONARY ANGIOGRAPHY N/A 04/14/2021   Procedure: LEFT HEART CATH AND CORONARY ANGIOGRAPHY;  Surgeon: Dann Candyce RAMAN, MD;  Location: [facility] INVASIVE CV LAB;  Service: Cardiovascular;  Laterality: N/A;   LOWER EXTREMITY  ANGIOGRAPHY Right 08/17/2017   Procedure: Lower Extremity Angiography;  Surgeon: Sheree Penne Bruckner, MD;  Location: [facility] INVASIVE CV LAB;  Service: Cardiovascular;  Laterality: Right;   LOWER EXTREMITY ANGIOGRAPHY N/A 12/26/2023   Procedure: Lower Extremity Angiography;  Surgeon: Sheree Penne Bruckner, MD;  Location: [facility] INVASIVE CV LAB;  Service: Cardiovascular;  Laterality: N/A;   LOWER EXTREMITY INTERVENTION N/A 12/26/2023   Procedure: LOWER EXTREMITY INTERVENTION;  Surgeon: Sheree Penne Bruckner, MD;  Location: [facility] INVASIVE CV LAB;  Service: Cardiovascular;  Laterality: N/A;   PERIPHERAL INTRAVASCULAR LITHOTRIPSY  12/26/2023   Procedure: PERIPHERAL INTRAVASCULAR LITHOTRIPSY;  Surgeon: Sheree Penne Bruckner, MD;  Location: [facility] INVASIVE CV LAB;  Service: Cardiovascular;;   PERIPHERAL VASCULAR BALLOON ANGIOPLASTY Right 08/17/2017   Procedure: PERIPHERAL VASCULAR BALLOON ANGIOPLASTY;  Surgeon: Sheree Penne Bruckner, MD;  Location: [facility] INVASIVE CV LAB;  Service: Cardiovascular;  Laterality: Right;  SFA UNABLE TO CROSS   VEIN HARVEST Right 08/18/2017   Procedure: VEIN HARVEST RIGHT GREAT SAPHENOUS;  Surgeon: Sheree Penne Bruckner, MD;  Location: [facility] OR;  Service: Vascular;  Laterality: Right;   WOUND DEBRIDEMENT Right 08/18/2017   Procedure: DEBRIDEMENT WOUND RIGHT FOOT;  Surgeon: Sheree Penne Bruckner, MD;  Location: [facility] OR;  Service: Vascular;  Laterality: Right;    Allergies  Allergen Reactions   Penicillins Hives and Rash    Childhood reaction Has patient had a PCN reaction causing immediate rash, facial/tongue/throat swelling, SOB or lightheadedness with hypotension: No PT DEVELOPED ## SEVERE ## RASH INVOLVING MUCUS MEMBRANES or SKIN NECROSIS: #  #  YES  #  # Has patient had a PCN reaction that required hospitalization: Unknown Has patient had a PCN reaction occurring within the last 10 years: No   Clindamycin  Rash    Diffuse rash across body    Current Outpatient Medications   Medication Sig Dispense Refill   BREZTRI AEROSPHERE 160-9-4.8 MCG/ACT AERO Inhale 2 puffs into the lungs 2 (two) times daily.     clopidogrel  (PLAVIX ) 75 MG tablet Take 1 tablet (75 mg total) by mouth daily. 90 tablet 2   doxycycline  (VIBRA -TABS) 100 MG tablet Take 1 tablet (100 mg total) by mouth 2 (two) times daily. 60 tablet 0   isosorbide  mononitrate (IMDUR ) 30 MG 24 hr tablet Take 1 tablet (30 mg total) by mouth daily. 90 tablet 2   losartan  (COZAAR ) 50 MG tablet Take 1 tablet (50 mg total) by mouth daily. 90 tablet 2   metoprolol  tartrate (LOPRESSOR ) 25 MG tablet Take 1 tablet (25 mg total) by mouth 2 (two) times daily. 180 tablet 2   Multiple Vitamins-Minerals (CENTRUM SILVER 50+MEN PO) Take 1 tablet by mouth daily.     niacin 500 MG tablet Take 500 mg by mouth daily.     nitroGLYCERIN  (NITROSTAT ) 0.4 MG SL tablet Place 0.4 mg under the tongue every 5 (five) minutes as needed for chest pain.     rosuvastatin  (CRESTOR ) 40 MG tablet Take 1 tablet (40 mg total) by mouth daily. 90 tablet 2  sulfamethoxazole -trimethoprim  (BACTRIM ) 400-80 MG tablet Take 1 tablet by mouth 2 (two) times daily. 20 tablet 0   Vitamin D-Vitamin K (VITAMIN K2-VITAMIN D3 PO) Take 1 tablet by mouth daily.     vitamin E 180 MG (400 UNITS) capsule Take 400 Units by mouth daily.     No current facility-administered medications for this visit.    Family History  Adopted: Yes    Social History   Socioeconomic History   Marital status: Divorced    Spouse name: Not on file   Number of children: Not on file   Years of education: Not on file   Highest education level: Not on file  Occupational History   Not on file  Tobacco Use   Smoking status: Former    Passive exposure: Never   Smokeless tobacco: Never  Vaping Use   Vaping status: Former  Substance and Sexual Activity   Alcohol use: Not Currently   Drug use: No   Sexual activity: Not on file  Other Topics Concern   Not on file  Social History  Narrative   Not on file   Social Drivers of Health   Financial Resource Strain: Not on file  Food Insecurity: Not on file  Transportation Needs: Not on file  Physical Activity: Not on file  Stress: Not on file  Social Connections: Not on file  Intimate Partner Violence: Not on file     REVIEW OF SYSTEMS:   [X]  denotes positive finding, [ ]  denotes negative finding Cardiac  Comments:  Chest pain or chest pressure:    Shortness of breath upon exertion:    Short of breath when lying flat:    Irregular heart rhythm:        Vascular    Pain in calf, thigh, or hip brought on by ambulation:    Pain in feet at night that wakes you up from your sleep:     Blood clot in your veins:    Leg swelling:  x       Pulmonary    Oxygen at home:    Productive cough:     Wheezing:         Neurologic    Sudden weakness in arms or legs:     Sudden numbness in arms or legs:     Sudden onset of difficulty speaking or slurred speech:    Temporary loss of vision in one eye:     Problems with dizziness:         Gastrointestinal    Blood in stool:     Vomited blood:         Genitourinary    Burning when urinating:     Blood in urine:        Psychiatric    Major depression:         Hematologic    Bleeding problems:    Problems with blood clotting too easily:        Skin    Rashes or ulcers:        Constitutional    Fever or chills:      PHYSICAL EXAMINATION:  Today's Vitals   03/14/24 1414  BP: (!) 166/71  Pulse: 67  Temp: 97.7 F (36.5 C)  TempSrc: Temporal  Weight: 247 lb 8 oz (112.3 kg)  PainSc: 0-No pain   Body mass index is 33.57 kg/m.   General:  WDWN in NAD; vital signs documented above Gait: Not observed HENT: WNL, normocephalic Pulmonary: normal non-labored  breathing , without wheezing Cardiac: regular HR, without carotid bruits Abdomen: soft, NT; aortic pulse is not palpable Skin: without rashes Vascular Exam/Pulses:  Right Left  Radial 2+ (normal)  2+ (normal)  DP monophasic monophasic  PT Brisk monophasic monophasic   Extremities: right foot looks better than when I saw him in September     Musculoskeletal: no muscle wasting or atrophy  Neurologic: A&O X 3 Psychiatric:  The pt has Normal affect.   Non-Invasive Vascular Imaging:   ABI's/TBI's on 03/14/2024: Right:  0.81/TMA - Great toe pressure: TMA Left:  0.61/0.39 - Great toe pressure: 52  Arterial duplex on 03/14/2024: +-----------+--------+-----+--------+----------+--------------+  RIGHT     PSV cm/sRatioStenosisWaveform  Comments        +-----------+--------+-----+--------+----------+--------------+  POP Prox   167                  monophasic                +-----------+--------+-----+--------+----------+--------------+  POP Distal 127                  monophasic                +-----------+--------+-----+--------+----------+--------------+  ATA Distal 79                   monophasic                +-----------+--------+-----+--------+----------+--------------+  PTA Distal 76                   monophasic                +-----------+--------+-----+--------+----------+--------------+  PERO Distal                               not visualized  +-----------+--------+-----+--------+----------+--------------+     Right Graft #1: Fem-AK pop  +------------------+--------+--------+----------+--------+                   PSV cm/sStenosisWaveform  Comments  +------------------+--------+--------+----------+--------+  Inflow           294             monophasic          +------------------+--------+--------+----------+--------+  Prox Anastomosis  147             monophasic          +------------------+--------+--------+----------+--------+  Proximal Graft    71              triphasic           +------------------+--------+--------+----------+--------+  Mid Graft         58              biphasic             +------------------+--------+--------+----------+--------+  Distal Graft      50              monophasic          +------------------+--------+--------+----------+--------+  Distal Anastomosis83              monophasic          +------------------+--------+--------+----------+--------+  Outflow          102             monophasic          +------------------+--------+--------+----------+--------+     Summary:  Right: Patent graft with no stenosis.  Inflow  50-74% stenosis   Previous ABI's/TBI's on 12/14/2023: Right:  0.63/amp - Great toe pressure: amp Left:  0.61/0.32 - Great toe pressure:  49    ASSESSMENT/PLAN:: 78 y.o. male here for follow up for PAD with hx of right femoral to above knee bypass with vein on 08/18/2017 by Dr. Sheree.  He subsequently underwent a TMA on the right by Dr. Harden 08/21/2017.  On 12/26/2023, he underwent shockwave lithotripsy of right CFA by Dr. Sheree for inflow stenosis and persistent right multifactorial foot wound.    -pt with improved ABI on the right and stable ABI on the left.  He has brisk monophasic doppler flow right PT and monophasic flow bilateral DP and left PT.  -elevated velocities at the CFA and decreased velocities in the mid graft.  Discussed with Dr. Sheree and he reviewed angio from September.  Will have him f/u in short interval with repeat studies.   -continue plavix /statin -pt will f/u in 3 months with RLE arterial duplex and ABI and see Dr. Sheree .   Lucie Apt, [facility] [phone]  Clinic MD:   Sheree

## 2024-03-19 ENCOUNTER — Ambulatory Visit: Admitting: Physician Assistant

## 2024-03-19 ENCOUNTER — Other Ambulatory Visit: Payer: Self-pay

## 2024-03-19 ENCOUNTER — Encounter: Payer: Self-pay | Admitting: Physician Assistant

## 2024-03-19 DIAGNOSIS — Z89431 Acquired absence of right foot: Secondary | ICD-10-CM

## 2024-03-19 DIAGNOSIS — I70735 Atherosclerosis of other type of bypass graft(s) of the right leg with ulceration of other part of foot: Secondary | ICD-10-CM

## 2024-03-19 DIAGNOSIS — I87333 Chronic venous hypertension (idiopathic) with ulcer and inflammation of bilateral lower extremity: Secondary | ICD-10-CM | POA: Diagnosis not present

## 2024-03-19 NOTE — Progress Notes (Signed)
 Office Visit Note   Patient: Jorge Clements           Date of Birth: Aug 09, 1945           MRN: [id number] Visit Date: 03/19/2024              Requested by: Shona Norleen PEDLAR, MD [address] [place],  [place] [address] PCP: Shona Norleen PEDLAR, MD  Chief Complaint  Patient presents with   Right Foot - Wound Check    Hx TMA      HPI: 78 y/o male who is s/p right TMA.  He is followed by [facility] as well for PAD.  He is s/p right femoral to above knee bypass with vein on 08/18/2017 by Dr. Sheree.  He subsequently underwent a TMA on the right by Dr. Harden 08/21/2017.  On 12/26/2023, he underwent shockwave lithotripsy of right CFA by Dr. Sheree for inflow stenosis and persistent right multifactorial foot wound.  He has stable ABI's on the right LE.    He has chronic edema and uses compression socks daily and elevation.  He states his skin is improving over the past few months.     Assessment & Plan: Visit Diagnoses:  1. Chronic venous hypertension (idiopathic) with ulcer and inflammation of bilateral lower extremity (HCC)   2. History of transmetatarsal amputation of right foot (HCC)     Plan: shower with soap and water PRN.  Compression socks daily and elevation when at rest.  If he develops issue such a new wound he will call our office, otherwise he will follow up in 3 months for callus trim.    Follow-Up Instructions: Return in about 3 months (around 06/17/2024).   Ortho Exam  Patient is alert, oriented, no adenopathy, well-dressed, normal affect, normal respiratory effort. Left TMA well healed.  Warm to touch.  After verbal consent a 10 blade was used to debride the plantar callus.  No open wound.  No cellulitis, drainage or abnormal edema.            Imaging: No results found. No images are attached to the encounter.  Labs: Lab Results  Component Value Date   HGBA1C 6.1 (H) 02/28/2023   HGBA1C 6.4 (H) 12/02/2019   ESRSEDRATE 28 (H) 01/18/2021   ESRSEDRATE 40 (H) 12/24/2020   CRP 11.1  (H) 01/18/2021   CRP 1.9 (H) 12/24/2020   REPTSTATUS 12/27/2020 FINAL 12/24/2020   GRAMSTAIN NO WBC SEEN NO ORGANISMS SEEN  12/24/2020   CULT  12/24/2020    FEW STAPHYLOCOCCUS AUREUS WITHIN MIXED ORGANISMS Performed at [facility], [address] [place], [place] [address]    [facility] STAPHYLOCOCCUS AUREUS 12/24/2020     Lab Results  Component Value Date   ALBUMIN 3.8 02/28/2023   ALBUMIN 4.0 03/23/2022   ALBUMIN 3.9 12/01/2019    Lab Results  Component Value Date   MG 2.2 08/17/2017   MG 1.8 08/15/2017   No results found for: VD25OH  No results found for: PREALBUMIN    Latest Ref Rng & Units 12/26/2023    7:51 AM 02/28/2023   12:18 PM 03/23/2022    8:00 PM  CBC EXTENDED  WBC 4.0 - 10.5 K/uL  9.1  9.6   RBC 4.22 - 5.81 MIL/uL  4.34  4.67   Hemoglobin 13.0 - 17.0 g/dL 86.6  86.7  85.9   HCT 39.0 - 52.0 % 39.0  40.0  43.5   Platelets 150 - 400 K/uL  156  132   NEUT# 1.7 - 7.7 K/uL   7.5   Lymph# 0.7 - 4.0 K/uL   1.4      There is no height or weight on file to calculate BMI.  Orders:  No orders of the defined types were placed in this encounter.  No orders of the defined types were placed in this encounter.    Procedures: No procedures performed  Clinical Data: No additional findings.  ROS:  All other systems negative, except as noted in the HPI. Review of Systems  Objective: Vital Signs: There were no vitals taken for this visit.  Specialty Comments:  No specialty comments available.  PMFS History: Patient Active Problem List   Diagnosis Date Noted   Alcohol abuse 12/02/2021   Anxiety 12/02/2021   Right leg pain 12/02/2021   Chest pain 04/14/2021   CAD (coronary artery disease)    Unstable angina (HCC)    Venous insufficiency (chronic) (peripheral)    Ulcer of right foot limited to breakdown of skin (HCC)    Chest pain, rule out acute myocardial infarction 12/23/2020   PAD (peripheral artery disease) 12/23/2020   HLD  (hyperlipidemia) 12/23/2020   ACS (acute coronary syndrome) (HCC) 12/01/2019   Non-ST elevation (NSTEMI) myocardial infarction (HCC) 12/01/2019   NSTEMI (non-ST elevated myocardial infarction) (HCC) 12/01/2019   History of transmetatarsal amputation of right foot (HCC)    Subacute osteomyelitis, right ankle and foot (HCC)    Infected blister of foot 08/13/2017   Pressure injury of skin 08/12/2017   Weakness 08/11/2017   COPD (chronic obstructive pulmonary disease) (HCC) 08/11/2017   Cellulitis of right lower extremity 08/11/2017   Fall at home, initial encounter 08/11/2017   Thrombocytopenia 08/11/2017   Weakness generalized 08/11/2017   Hypokalemia 08/11/2017   Essential hypertension 08/11/2017   Past Medical History:  Diagnosis Date   Abdominal aortic aneurysm    no AAA on US  in 2020   CAD (coronary artery disease)    Stent to RCA 2021   Chronic obstructive pulmonary disease    HLD (hyperlipidemia)    HTN (hypertension)    Peripheral artery disease    s/p R fem pop // s/p R transmet amp    Family History  Adopted: Yes    Past Surgical History:  Procedure Laterality Date   ABDOMINAL AORTOGRAM N/A 08/17/2017   Procedure: ABDOMINAL AORTOGRAM;  Surgeon: Sheree Penne Bruckner, MD;  Location: [facility] INVASIVE CV LAB;  Service: Cardiovascular;  Laterality: N/A;   ABDOMINAL AORTOGRAM N/A 12/26/2023   Procedure: ABDOMINAL AORTOGRAM;  Surgeon: Sheree Penne Bruckner, MD;  Location: [facility] INVASIVE CV LAB;  Service: Cardiovascular;  Laterality: N/A;   ABDOMINAL AORTOGRAM W/LOWER EXTREMITY Right 07/13/2021   Procedure: ABDOMINAL AORTOGRAM W/LOWER EXTREMITY;  Surgeon: Sheree Penne Bruckner, MD;  Location: [facility] INVASIVE CV LAB;  Service: Cardiovascular;  Laterality: Right;   AMPUTATION Right 08/21/2017   Procedure: TRANSMETATARSAL AMPUTATION RIGHT FOOT;  Surgeon: Harden Jerona GAILS, MD;  Location: [facility] OR;  Service: Orthopedics;  Laterality: Right;   AMPUTATION TOE Right 08/18/2017   Procedure:  AMPUTATION RIGHT SECOND AND THIRD TOES;  Surgeon: Sheree Penne Bruckner, MD;  Location: [facility] OR;  Service: Vascular;  Laterality: Right;   CORONARY BALLOON ANGIOPLASTY N/A 12/03/2019   Procedure: CORONARY BALLOON ANGIOPLASTY;  Surgeon: Dann Candyce RAMAN, MD;  Location: [facility] INVASIVE CV LAB;  Service: Cardiovascular;  Laterality: N/A;   CORONARY STENT INTERVENTION N/A 12/03/2019   Procedure: CORONARY STENT INTERVENTION;  Surgeon: Dann Candyce RAMAN, MD;  Location: [facility] INVASIVE  CV LAB;  Service: Cardiovascular;  Laterality: N/A;   ENDARTERECTOMY FEMORAL Right 08/18/2017   Procedure: ENDARTERECTOMY RIGHT SUPERFICIAL SCHERYL;  Surgeon: Sheree Penne Bruckner, MD;  Location: [facility] OR;  Service: Vascular;  Laterality: Right;   FEMORAL-POPLITEAL BYPASS GRAFT Right 08/18/2017   Procedure: BYPASS GRAFT RIGHT COMMON FEMORAL TO ABOVE KNEE POPLITEAL ARTERY USING RIGHT REVERSED GREAT SAPHENOUS VEIN;  Surgeon: Sheree Penne Bruckner, MD;  Location: [facility] OR;  Service: Vascular;  Laterality: Right;   LEFT HEART CATH AND CORONARY ANGIOGRAPHY N/A 12/03/2019   Procedure: LEFT HEART CATH AND CORONARY ANGIOGRAPHY;  Surgeon: Dann Candyce RAMAN, MD;  Location: [facility] INVASIVE CV LAB;  Service: Cardiovascular;  Laterality: N/A;   LEFT HEART CATH AND CORONARY ANGIOGRAPHY N/A 04/14/2021   Procedure: LEFT HEART CATH AND CORONARY ANGIOGRAPHY;  Surgeon: Dann Candyce RAMAN, MD;  Location: [facility] INVASIVE CV LAB;  Service: Cardiovascular;  Laterality: N/A;   LOWER EXTREMITY ANGIOGRAPHY Right 08/17/2017   Procedure: Lower Extremity Angiography;  Surgeon: Sheree Penne Bruckner, MD;  Location: [facility] INVASIVE CV LAB;  Service: Cardiovascular;  Laterality: Right;   LOWER EXTREMITY ANGIOGRAPHY N/A 12/26/2023   Procedure: Lower Extremity Angiography;  Surgeon: Sheree Penne Bruckner, MD;  Location: [facility] INVASIVE CV LAB;  Service: Cardiovascular;  Laterality: N/A;   LOWER EXTREMITY INTERVENTION N/A 12/26/2023   Procedure: LOWER EXTREMITY  INTERVENTION;  Surgeon: Sheree Penne Bruckner, MD;  Location: [facility] INVASIVE CV LAB;  Service: Cardiovascular;  Laterality: N/A;   PERIPHERAL INTRAVASCULAR LITHOTRIPSY  12/26/2023   Procedure: PERIPHERAL INTRAVASCULAR LITHOTRIPSY;  Surgeon: Sheree Penne Bruckner, MD;  Location: [facility] INVASIVE CV LAB;  Service: Cardiovascular;;   PERIPHERAL VASCULAR BALLOON ANGIOPLASTY Right 08/17/2017   Procedure: PERIPHERAL VASCULAR BALLOON ANGIOPLASTY;  Surgeon: Sheree Penne Bruckner, MD;  Location: [facility] INVASIVE CV LAB;  Service: Cardiovascular;  Laterality: Right;  SFA UNABLE TO CROSS   VEIN HARVEST Right 08/18/2017   Procedure: VEIN HARVEST RIGHT GREAT SAPHENOUS;  Surgeon: Sheree Penne Bruckner, MD;  Location: [facility] OR;  Service: Vascular;  Laterality: Right;   WOUND DEBRIDEMENT Right 08/18/2017   Procedure: DEBRIDEMENT WOUND RIGHT FOOT;  Surgeon: Sheree Penne Bruckner, MD;  Location: [facility] OR;  Service: Vascular;  Laterality: Right;   Social History   Occupational History   Not on file  Tobacco Use   Smoking status: Former    Passive exposure: Never   Smokeless tobacco: Never  Vaping Use   Vaping status: Former  Substance and Sexual Activity   Alcohol use: Not Currently   Drug use: No   Sexual activity: Not on file

## 2024-05-01 ENCOUNTER — Encounter: Payer: Self-pay | Admitting: *Deleted

## 2024-05-01 NOTE — Progress Notes (Signed)
 Jorge Clements                                          MRN: [id number]   05/01/2024   The [facility] Quality Team Specialist reviewed this patient medical record for the purposes of chart review for care gap closure. The following were reviewed: chart review for care gap closure-controlling blood pressure.    [facility] Quality Team

## 2024-06-13 ENCOUNTER — Ambulatory Visit (HOSPITAL_COMMUNITY)

## 2024-06-13 ENCOUNTER — Ambulatory Visit: Admitting: Vascular Surgery

## 2024-06-18 ENCOUNTER — Ambulatory Visit: Admitting: Physician Assistant
